# Patient Record
Sex: Male | Born: 1940 | Race: White | Hispanic: No | Marital: Married | Smoking: Former smoker
Health system: Southern US, Community
[De-identification: ages and names within clinical notes are randomized; demographics above are authoritative.]

## PROBLEM LIST (undated history)

## (undated) DIAGNOSIS — F419 Anxiety disorder, unspecified: Secondary | ICD-10-CM

## (undated) DIAGNOSIS — I10 Essential (primary) hypertension: Secondary | ICD-10-CM

## (undated) HISTORY — DX: Anxiety disorder, unspecified: F41.9

## (undated) HISTORY — PX: BACK SURGERY: SHX140

---

## 2019-06-26 ENCOUNTER — Encounter (HOSPITAL_COMMUNITY): Payer: Self-pay | Admitting: *Deleted

## 2019-06-26 ENCOUNTER — Other Ambulatory Visit: Payer: Self-pay

## 2019-06-26 ENCOUNTER — Emergency Department (HOSPITAL_COMMUNITY): Payer: Medicare Other

## 2019-06-26 ENCOUNTER — Emergency Department (HOSPITAL_COMMUNITY)
Admission: EM | Admit: 2019-06-26 | Discharge: 2019-06-27 | Disposition: A | Discharge status: Home / Self Care | Payer: Medicare Other | Attending: Emergency Medicine | Admitting: Emergency Medicine

## 2019-06-26 DIAGNOSIS — R1110 Vomiting, unspecified: Secondary | ICD-10-CM | POA: Insufficient documentation

## 2019-06-26 DIAGNOSIS — R509 Fever, unspecified: Secondary | ICD-10-CM | POA: Insufficient documentation

## 2019-06-26 DIAGNOSIS — Z9889 Other specified postprocedural states: Secondary | ICD-10-CM | POA: Insufficient documentation

## 2019-06-26 DIAGNOSIS — R0602 Shortness of breath: Secondary | ICD-10-CM | POA: Diagnosis not present

## 2019-06-26 DIAGNOSIS — Z5321 Procedure and treatment not carried out due to patient leaving prior to being seen by health care provider: Secondary | ICD-10-CM | POA: Insufficient documentation

## 2019-06-26 HISTORY — DX: Essential (primary) hypertension: I10

## 2019-06-26 LAB — URINALYSIS, ROUTINE W REFLEX MICROSCOPIC
Bilirubin Urine: NEGATIVE
Glucose, UA: NEGATIVE mg/dL
Ketones, ur: 20 mg/dL — AB
Leukocytes,Ua: NEGATIVE
Nitrite: NEGATIVE
Protein, ur: 30 mg/dL — AB
Specific Gravity, Urine: 1.025 (ref 1.005–1.030)
pH: 5 (ref 5.0–8.0)

## 2019-06-26 LAB — CBC WITH DIFFERENTIAL/PLATELET
Abs Immature Granulocytes: 0.06 10*3/uL (ref 0.00–0.07)
Basophils Absolute: 0 10*3/uL (ref 0.0–0.1)
Basophils Relative: 0 %
Eosinophils Absolute: 0 10*3/uL (ref 0.0–0.5)
Eosinophils Relative: 0 %
HCT: 34.2 % — ABNORMAL LOW (ref 39.0–52.0)
Hemoglobin: 11.2 g/dL — ABNORMAL LOW (ref 13.0–17.0)
Immature Granulocytes: 1 %
Lymphocytes Relative: 5 %
Lymphs Abs: 0.5 10*3/uL — ABNORMAL LOW (ref 0.7–4.0)
MCH: 30.2 pg (ref 26.0–34.0)
MCHC: 32.7 g/dL (ref 30.0–36.0)
MCV: 92.2 fL (ref 80.0–100.0)
Monocytes Absolute: 0.9 10*3/uL (ref 0.1–1.0)
Monocytes Relative: 9 %
Neutro Abs: 8.7 10*3/uL — ABNORMAL HIGH (ref 1.7–7.7)
Neutrophils Relative %: 85 %
Platelets: 193 10*3/uL (ref 150–400)
RBC: 3.71 MIL/uL — ABNORMAL LOW (ref 4.22–5.81)
RDW: 13.1 % (ref 11.5–15.5)
WBC: 10.2 10*3/uL (ref 4.0–10.5)
nRBC: 0 % (ref 0.0–0.2)

## 2019-06-26 LAB — COMPREHENSIVE METABOLIC PANEL
ALT: 19 U/L (ref 0–44)
AST: 17 U/L (ref 15–41)
Albumin: 3.6 g/dL (ref 3.5–5.0)
Alkaline Phosphatase: 49 U/L (ref 38–126)
Anion gap: 11 (ref 5–15)
BUN: 27 mg/dL — ABNORMAL HIGH (ref 8–23)
CO2: 23 mmol/L (ref 22–32)
Calcium: 9.7 mg/dL (ref 8.9–10.3)
Chloride: 100 mmol/L (ref 98–111)
Creatinine, Ser: 1.28 mg/dL — ABNORMAL HIGH (ref 0.61–1.24)
GFR calc Af Amer: >60 mL/min (ref >60)
GFR calc non Af Amer: 53 mL/min — ABNORMAL LOW (ref >60)
Glucose, Bld: 159 mg/dL — ABNORMAL HIGH (ref 70–99)
Potassium: 4.1 mmol/L (ref 3.5–5.1)
Sodium: 134 mmol/L — ABNORMAL LOW (ref 135–145)
Total Bilirubin: 1.6 mg/dL — ABNORMAL HIGH (ref 0.3–1.2)
Total Protein: 6.5 g/dL (ref 6.5–8.1)

## 2019-06-26 LAB — LACTIC ACID, PLASMA: Lactic Acid, Venous: 1.7 mmol/L (ref 0.5–1.9)

## 2019-06-26 IMAGING — CR DG CHEST 2V
2 series · 2 of 2 positions shown · non-contrast
Comparison: None.

CLINICAL DATA: Fever.

EXAM:
CHEST - 2 VIEW

[chest pa]
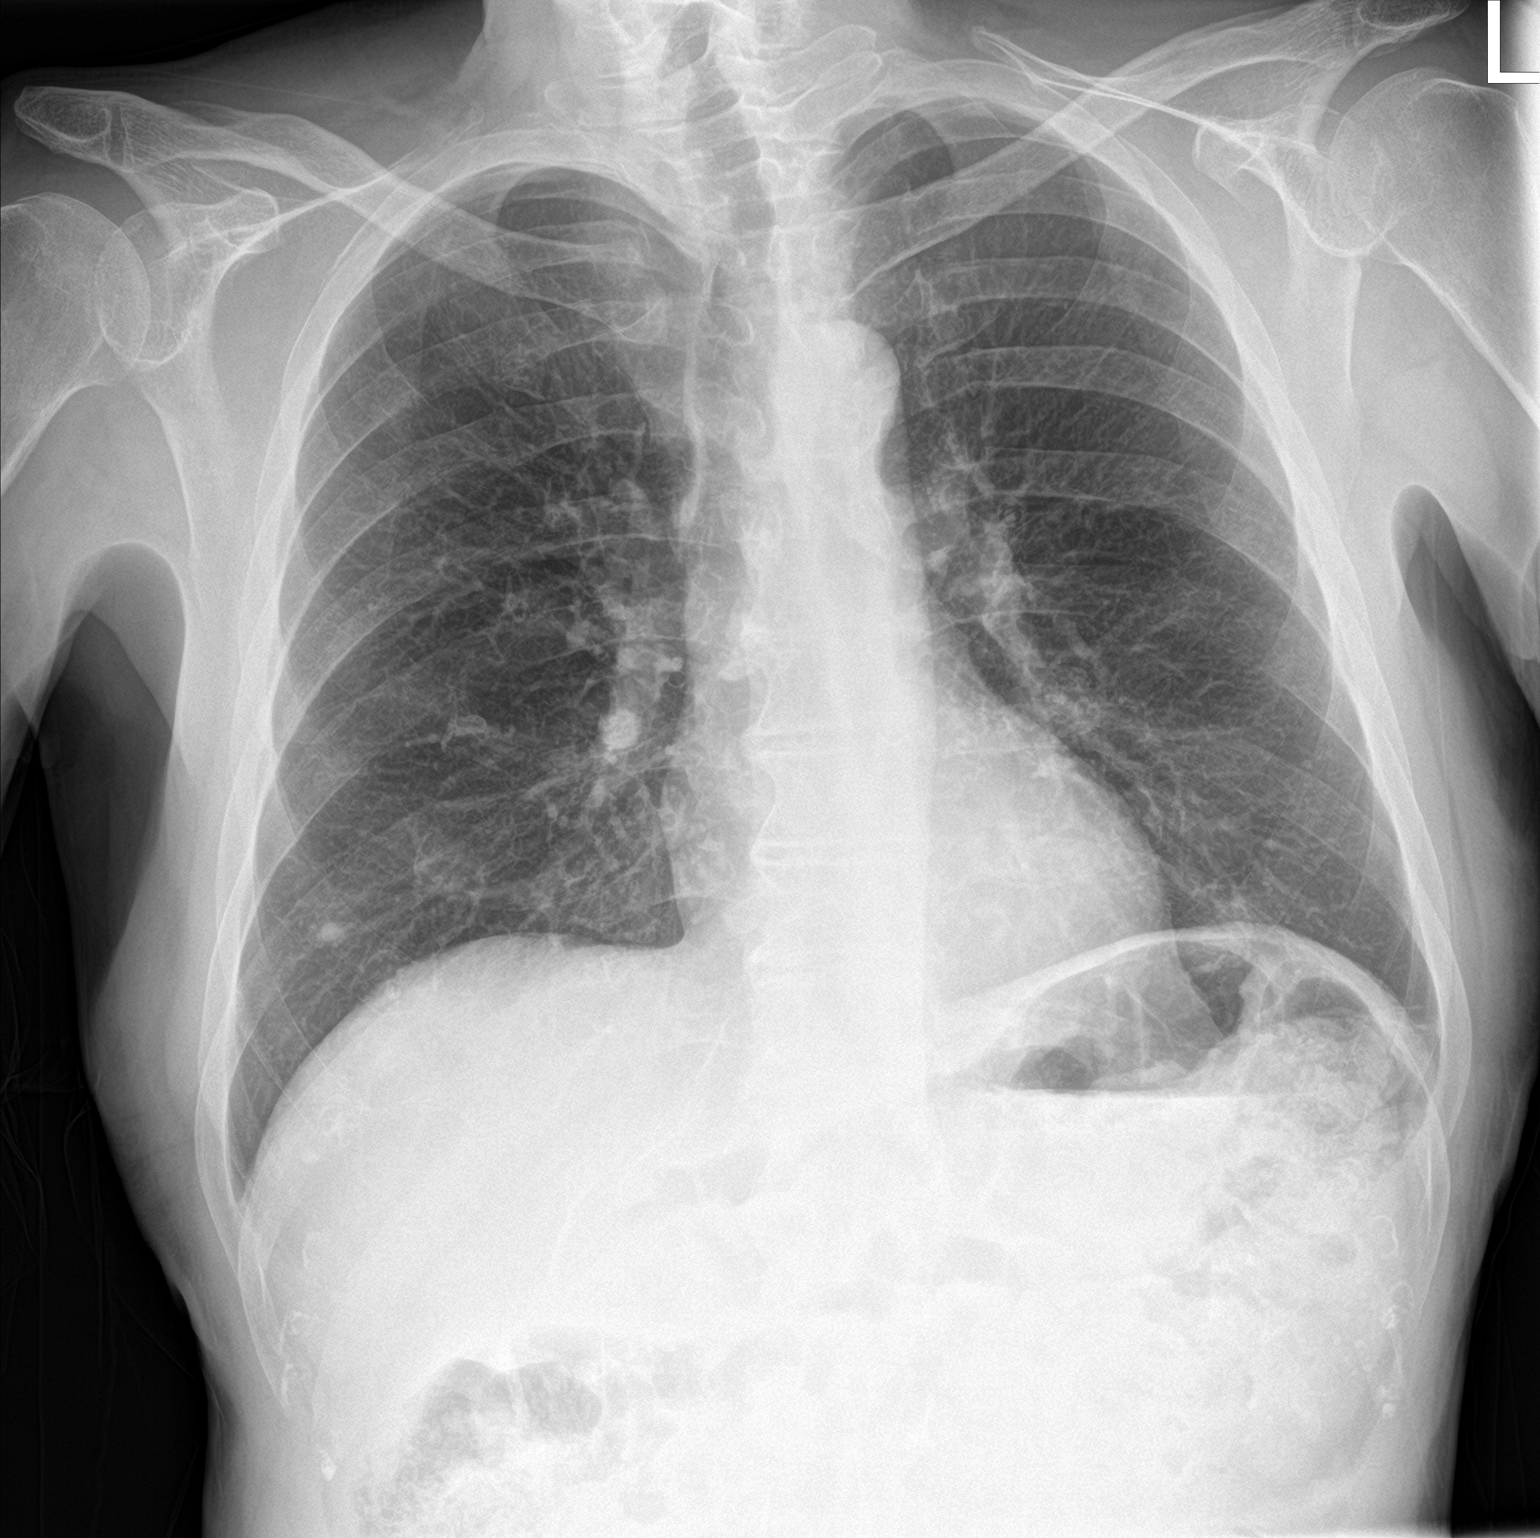

[chest lat]
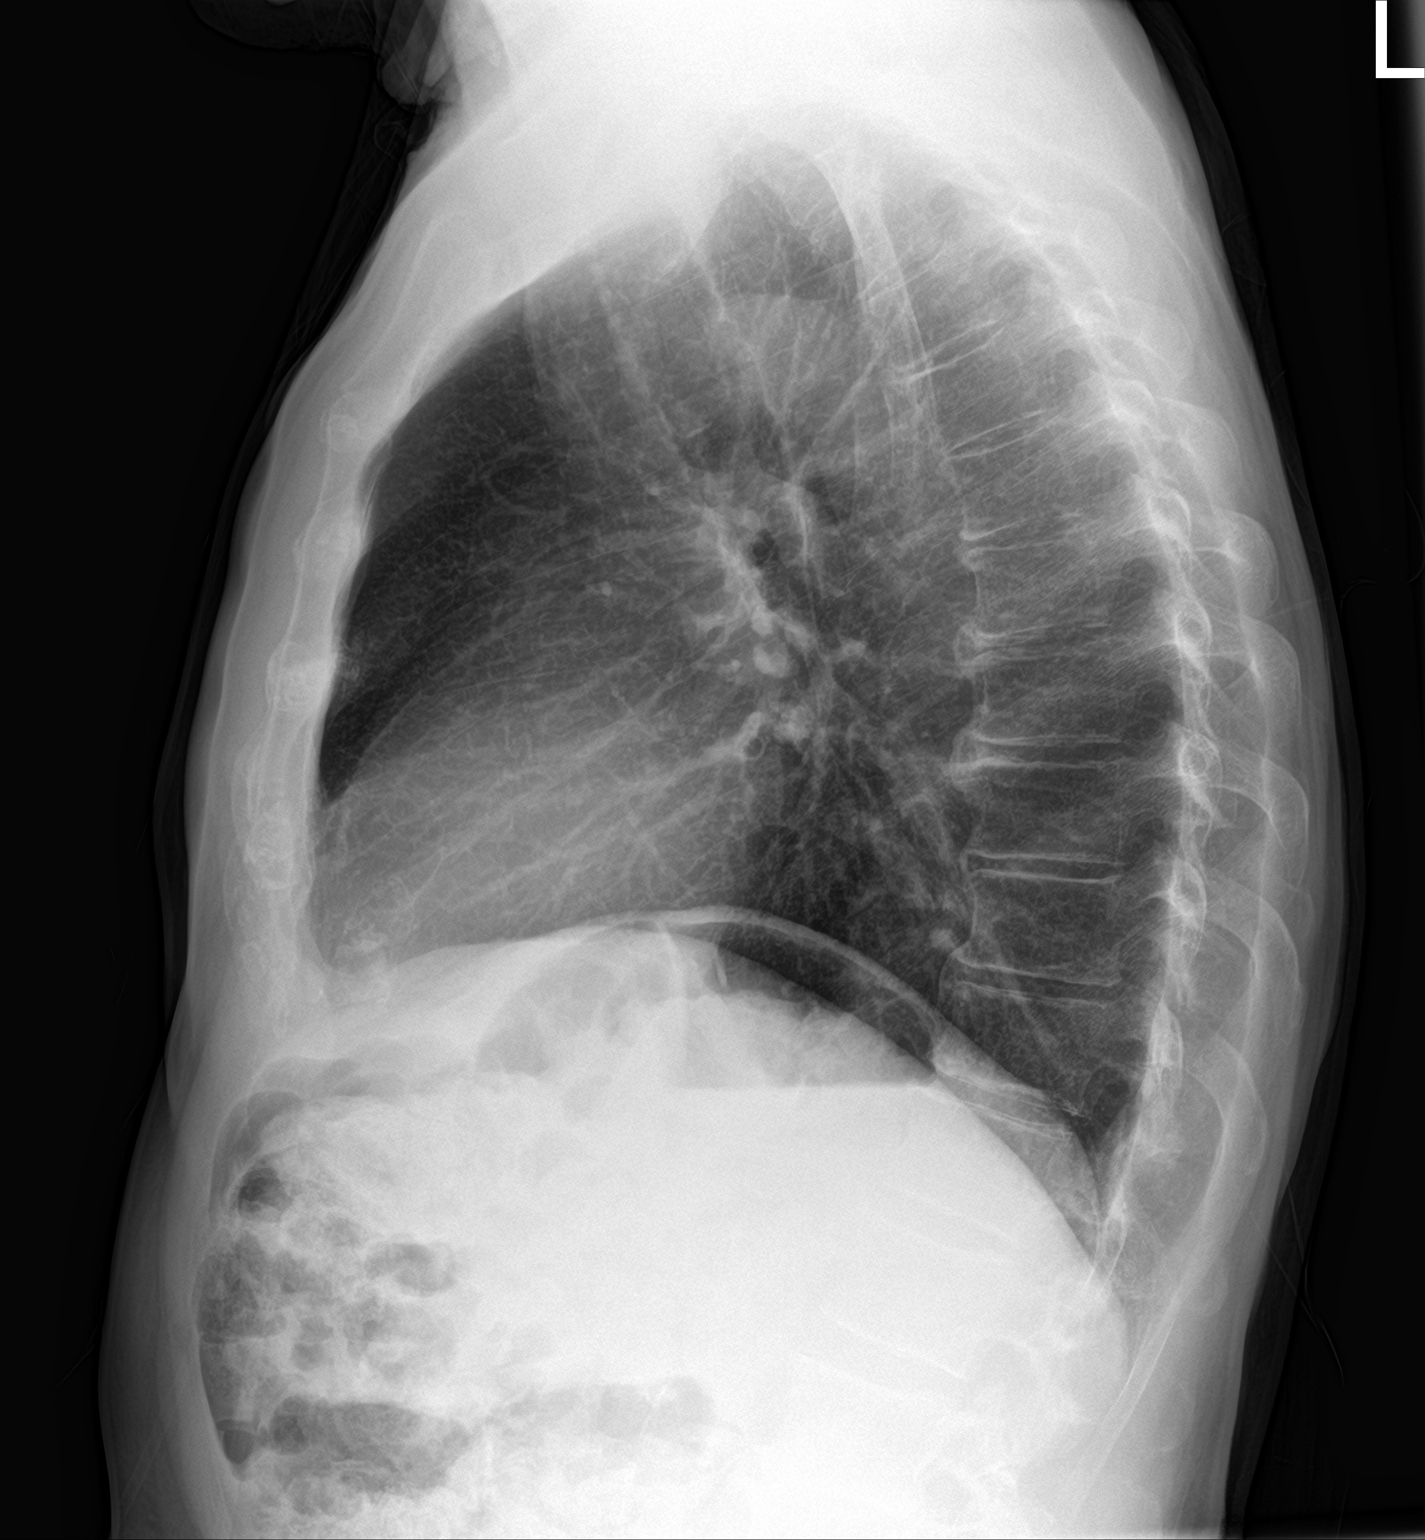

[2 of 2 positions shown; findings below may reference images not displayed]

FINDINGS: There is a rounded density at the right lung base overlying the
posterior ninth rib. There may be a calcified hilar lymph node on
the right. The heart size is normal. There is no pneumothorax. No
acute osseous abnormality. No significant pleural effusion. There
are atherosclerotic changes of the thoracic aorta.
IMPRESSION: 1. No acute cardiopulmonary process.
2. Probable granulomatous disease. However, in the absence of a
prior x-ray a follow-up two-view chest x-ray is recommended in 3
months to confirm stability of the dense pulmonary nodule at the
right lung base.

## 2019-06-26 MED ORDER — SODIUM CHLORIDE 0.9% FLUSH: 3.0000 mL | Freq: Once | INTRAVENOUS | Status: DC

## 2019-06-26 NOTE — ED Triage Notes (Signed)
Pt reports onset of vomiting today. Has also had a fever, shortness of breath, constipation. Denies cough. Pt reports having back surgery 1 week ago for herniated disk- area is red, draining serous drainage.

## 2019-06-26 NOTE — ED Notes (Signed)
Pt stated he is leaving AMA. He said he will go see a MD tomorrow. Pt advised to return if symptoms worsen.

## 2019-06-30 ENCOUNTER — Encounter (HOSPITAL_COMMUNITY): Payer: Self-pay | Admitting: Emergency Medicine

## 2019-06-30 ENCOUNTER — Emergency Department (HOSPITAL_COMMUNITY): Payer: Medicare Other

## 2019-06-30 ENCOUNTER — Inpatient Hospital Stay (HOSPITAL_COMMUNITY)
Admission: EM | Admit: 2019-06-30 | Discharge: 2019-07-10 | DRG: 856 | Disposition: A | Discharge status: Home Health | Payer: Medicare Other | Attending: Neurosurgery | Admitting: Neurosurgery

## 2019-06-30 DIAGNOSIS — T8149XA Infection following a procedure, other surgical site, initial encounter: Secondary | ICD-10-CM | POA: Diagnosis present

## 2019-06-30 DIAGNOSIS — B9561 Methicillin susceptible Staphylococcus aureus infection as the cause of diseases classified elsewhere: Secondary | ICD-10-CM | POA: Diagnosis present

## 2019-06-30 DIAGNOSIS — T8131XA Disruption of external operation (surgical) wound, not elsewhere classified, initial encounter: Secondary | ICD-10-CM | POA: Diagnosis present

## 2019-06-30 DIAGNOSIS — I429 Cardiomyopathy, unspecified: Secondary | ICD-10-CM | POA: Diagnosis present

## 2019-06-30 DIAGNOSIS — L89302 Pressure ulcer of unspecified buttock, stage 2: Secondary | ICD-10-CM | POA: Diagnosis not present

## 2019-06-30 DIAGNOSIS — I081 Rheumatic disorders of both mitral and tricuspid valves: Secondary | ICD-10-CM | POA: Diagnosis present

## 2019-06-30 DIAGNOSIS — F17290 Nicotine dependence, other tobacco product, uncomplicated: Secondary | ICD-10-CM | POA: Diagnosis present

## 2019-06-30 DIAGNOSIS — G061 Intraspinal abscess and granuloma: Secondary | ICD-10-CM | POA: Diagnosis present

## 2019-06-30 DIAGNOSIS — I5042 Chronic combined systolic (congestive) and diastolic (congestive) heart failure: Secondary | ICD-10-CM | POA: Diagnosis present

## 2019-06-30 DIAGNOSIS — E876 Hypokalemia: Secondary | ICD-10-CM | POA: Diagnosis not present

## 2019-06-30 DIAGNOSIS — T8143XA Infection following a procedure, organ and space surgical site, initial encounter: Secondary | ICD-10-CM | POA: Diagnosis present

## 2019-06-30 DIAGNOSIS — I444 Left anterior fascicular block: Secondary | ICD-10-CM | POA: Diagnosis present

## 2019-06-30 DIAGNOSIS — L0291 Cutaneous abscess, unspecified: Secondary | ICD-10-CM

## 2019-06-30 DIAGNOSIS — L0231 Cutaneous abscess of buttock: Secondary | ICD-10-CM | POA: Diagnosis not present

## 2019-06-30 DIAGNOSIS — G9341 Metabolic encephalopathy: Secondary | ICD-10-CM

## 2019-06-30 DIAGNOSIS — Y838 Other surgical procedures as the cause of abnormal reaction of the patient, or of later complication, without mention of misadventure at the time of the procedure: Secondary | ICD-10-CM | POA: Diagnosis present

## 2019-06-30 DIAGNOSIS — N179 Acute kidney failure, unspecified: Secondary | ICD-10-CM

## 2019-06-30 DIAGNOSIS — Z79899 Other long term (current) drug therapy: Secondary | ICD-10-CM | POA: Diagnosis not present

## 2019-06-30 DIAGNOSIS — L89152 Pressure ulcer of sacral region, stage 2: Secondary | ICD-10-CM | POA: Diagnosis present

## 2019-06-30 DIAGNOSIS — G062 Extradural and subdural abscess, unspecified: Secondary | ICD-10-CM

## 2019-06-30 DIAGNOSIS — F05 Delirium due to known physiological condition: Secondary | ICD-10-CM | POA: Diagnosis present

## 2019-06-30 DIAGNOSIS — M4646 Discitis, unspecified, lumbar region: Secondary | ICD-10-CM | POA: Diagnosis present

## 2019-06-30 DIAGNOSIS — I4892 Unspecified atrial flutter: Secondary | ICD-10-CM

## 2019-06-30 DIAGNOSIS — R6510 Systemic inflammatory response syndrome (SIRS) of non-infectious origin without acute organ dysfunction: Secondary | ICD-10-CM | POA: Diagnosis present

## 2019-06-30 DIAGNOSIS — I959 Hypotension, unspecified: Secondary | ICD-10-CM | POA: Diagnosis present

## 2019-06-30 DIAGNOSIS — Z95828 Presence of other vascular implants and grafts: Secondary | ICD-10-CM

## 2019-06-30 DIAGNOSIS — I4891 Unspecified atrial fibrillation: Secondary | ICD-10-CM | POA: Diagnosis not present

## 2019-06-30 DIAGNOSIS — R410 Disorientation, unspecified: Secondary | ICD-10-CM | POA: Diagnosis not present

## 2019-06-30 DIAGNOSIS — I5041 Acute combined systolic (congestive) and diastolic (congestive) heart failure: Secondary | ICD-10-CM | POA: Diagnosis not present

## 2019-06-30 DIAGNOSIS — R600 Localized edema: Secondary | ICD-10-CM | POA: Diagnosis not present

## 2019-06-30 DIAGNOSIS — I130 Hypertensive heart and chronic kidney disease with heart failure and stage 1 through stage 4 chronic kidney disease, or unspecified chronic kidney disease: Secondary | ICD-10-CM | POA: Diagnosis present

## 2019-06-30 DIAGNOSIS — F329 Major depressive disorder, single episode, unspecified: Secondary | ICD-10-CM | POA: Diagnosis present

## 2019-06-30 DIAGNOSIS — E871 Hypo-osmolality and hyponatremia: Secondary | ICD-10-CM | POA: Diagnosis present

## 2019-06-30 DIAGNOSIS — Z20822 Contact with and (suspected) exposure to covid-19: Secondary | ICD-10-CM | POA: Diagnosis present

## 2019-06-30 DIAGNOSIS — R7881 Bacteremia: Secondary | ICD-10-CM | POA: Diagnosis not present

## 2019-06-30 DIAGNOSIS — R471 Dysarthria and anarthria: Secondary | ICD-10-CM | POA: Diagnosis present

## 2019-06-30 DIAGNOSIS — D638 Anemia in other chronic diseases classified elsewhere: Secondary | ICD-10-CM | POA: Diagnosis present

## 2019-06-30 DIAGNOSIS — E860 Dehydration: Secondary | ICD-10-CM | POA: Diagnosis present

## 2019-06-30 DIAGNOSIS — L8990 Pressure ulcer of unspecified site, unspecified stage: Secondary | ICD-10-CM | POA: Insufficient documentation

## 2019-06-30 DIAGNOSIS — I313 Pericardial effusion (noninflammatory): Secondary | ICD-10-CM | POA: Diagnosis present

## 2019-06-30 DIAGNOSIS — N183 Chronic kidney disease, stage 3 unspecified: Secondary | ICD-10-CM | POA: Diagnosis present

## 2019-06-30 DIAGNOSIS — I340 Nonrheumatic mitral (valve) insufficiency: Secondary | ICD-10-CM | POA: Diagnosis not present

## 2019-06-30 DIAGNOSIS — Z825 Family history of asthma and other chronic lower respiratory diseases: Secondary | ICD-10-CM

## 2019-06-30 DIAGNOSIS — D649 Anemia, unspecified: Secondary | ICD-10-CM | POA: Diagnosis not present

## 2019-06-30 LAB — COMPREHENSIVE METABOLIC PANEL
ALT: 23 U/L (ref 0–44)
AST: 29 U/L (ref 15–41)
Albumin: 2.3 g/dL — ABNORMAL LOW (ref 3.5–5.0)
Alkaline Phosphatase: 76 U/L (ref 38–126)
Anion gap: 12 (ref 5–15)
BUN: 41 mg/dL — ABNORMAL HIGH (ref 8–23)
CO2: 22 mmol/L (ref 22–32)
Calcium: 8.8 mg/dL — ABNORMAL LOW (ref 8.9–10.3)
Chloride: 93 mmol/L — ABNORMAL LOW (ref 98–111)
Creatinine, Ser: 1.44 mg/dL — ABNORMAL HIGH (ref 0.61–1.24)
GFR calc Af Amer: 54 mL/min — ABNORMAL LOW (ref >60)
GFR calc non Af Amer: 46 mL/min — ABNORMAL LOW (ref >60)
Glucose, Bld: 153 mg/dL — ABNORMAL HIGH (ref 70–99)
Potassium: 4.6 mmol/L (ref 3.5–5.1)
Sodium: 127 mmol/L — ABNORMAL LOW (ref 135–145)
Total Bilirubin: 1.1 mg/dL (ref 0.3–1.2)
Total Protein: 5.3 g/dL — ABNORMAL LOW (ref 6.5–8.1)

## 2019-06-30 LAB — CBC WITH DIFFERENTIAL/PLATELET
Abs Immature Granulocytes: 0.07 10*3/uL (ref 0.00–0.07)
Basophils Absolute: 0.1 10*3/uL (ref 0.0–0.1)
Basophils Relative: 1 %
Eosinophils Absolute: 0 10*3/uL (ref 0.0–0.5)
Eosinophils Relative: 0 %
HCT: 30.4 % — ABNORMAL LOW (ref 39.0–52.0)
Hemoglobin: 10.1 g/dL — ABNORMAL LOW (ref 13.0–17.0)
Immature Granulocytes: 1 %
Lymphocytes Relative: 2 %
Lymphs Abs: 0.2 10*3/uL — ABNORMAL LOW (ref 0.7–4.0)
MCH: 30 pg (ref 26.0–34.0)
MCHC: 33.2 g/dL (ref 30.0–36.0)
MCV: 90.2 fL (ref 80.0–100.0)
Monocytes Absolute: 0.6 10*3/uL (ref 0.1–1.0)
Monocytes Relative: 8 %
Neutro Abs: 6.5 10*3/uL (ref 1.7–7.7)
Neutrophils Relative %: 88 %
Platelets: 166 10*3/uL (ref 150–400)
RBC: 3.37 MIL/uL — ABNORMAL LOW (ref 4.22–5.81)
RDW: 13.5 % (ref 11.5–15.5)
WBC: 7.4 10*3/uL (ref 4.0–10.5)
nRBC: 0 % (ref 0.0–0.2)

## 2019-06-30 LAB — URINALYSIS, ROUTINE W REFLEX MICROSCOPIC
Bilirubin Urine: NEGATIVE
Glucose, UA: NEGATIVE mg/dL
Ketones, ur: NEGATIVE mg/dL
Leukocytes,Ua: NEGATIVE
Nitrite: NEGATIVE
Protein, ur: 100 mg/dL — AB
Specific Gravity, Urine: 1.018 (ref 1.005–1.030)
pH: 5 (ref 5.0–8.0)

## 2019-06-30 LAB — LACTIC ACID, PLASMA: Lactic Acid, Venous: 1.7 mmol/L (ref 0.5–1.9)

## 2019-06-30 LAB — SARS CORONAVIRUS 2 BY RT PCR (HOSPITAL ORDER, PERFORMED IN ~~LOC~~ HOSPITAL LAB): SARS Coronavirus 2: NEGATIVE

## 2019-06-30 LAB — APTT: aPTT: 27 seconds (ref 24–36)

## 2019-06-30 LAB — PROTIME-INR
INR: 1.1 (ref 0.8–1.2)
Prothrombin Time: 13.3 seconds (ref 11.4–15.2)

## 2019-06-30 LAB — C-REACTIVE PROTEIN: CRP: 27.2 mg/dL — ABNORMAL HIGH (ref <1.0)

## 2019-06-30 IMAGING — DX DG CHEST 1V PORT
1 series · 1 of 1 positions shown · non-contrast
Comparison: [DATE]

CLINICAL DATA: Fever, altered mental status

EXAM:
PORTABLE CHEST 1 VIEW

[chest ap]
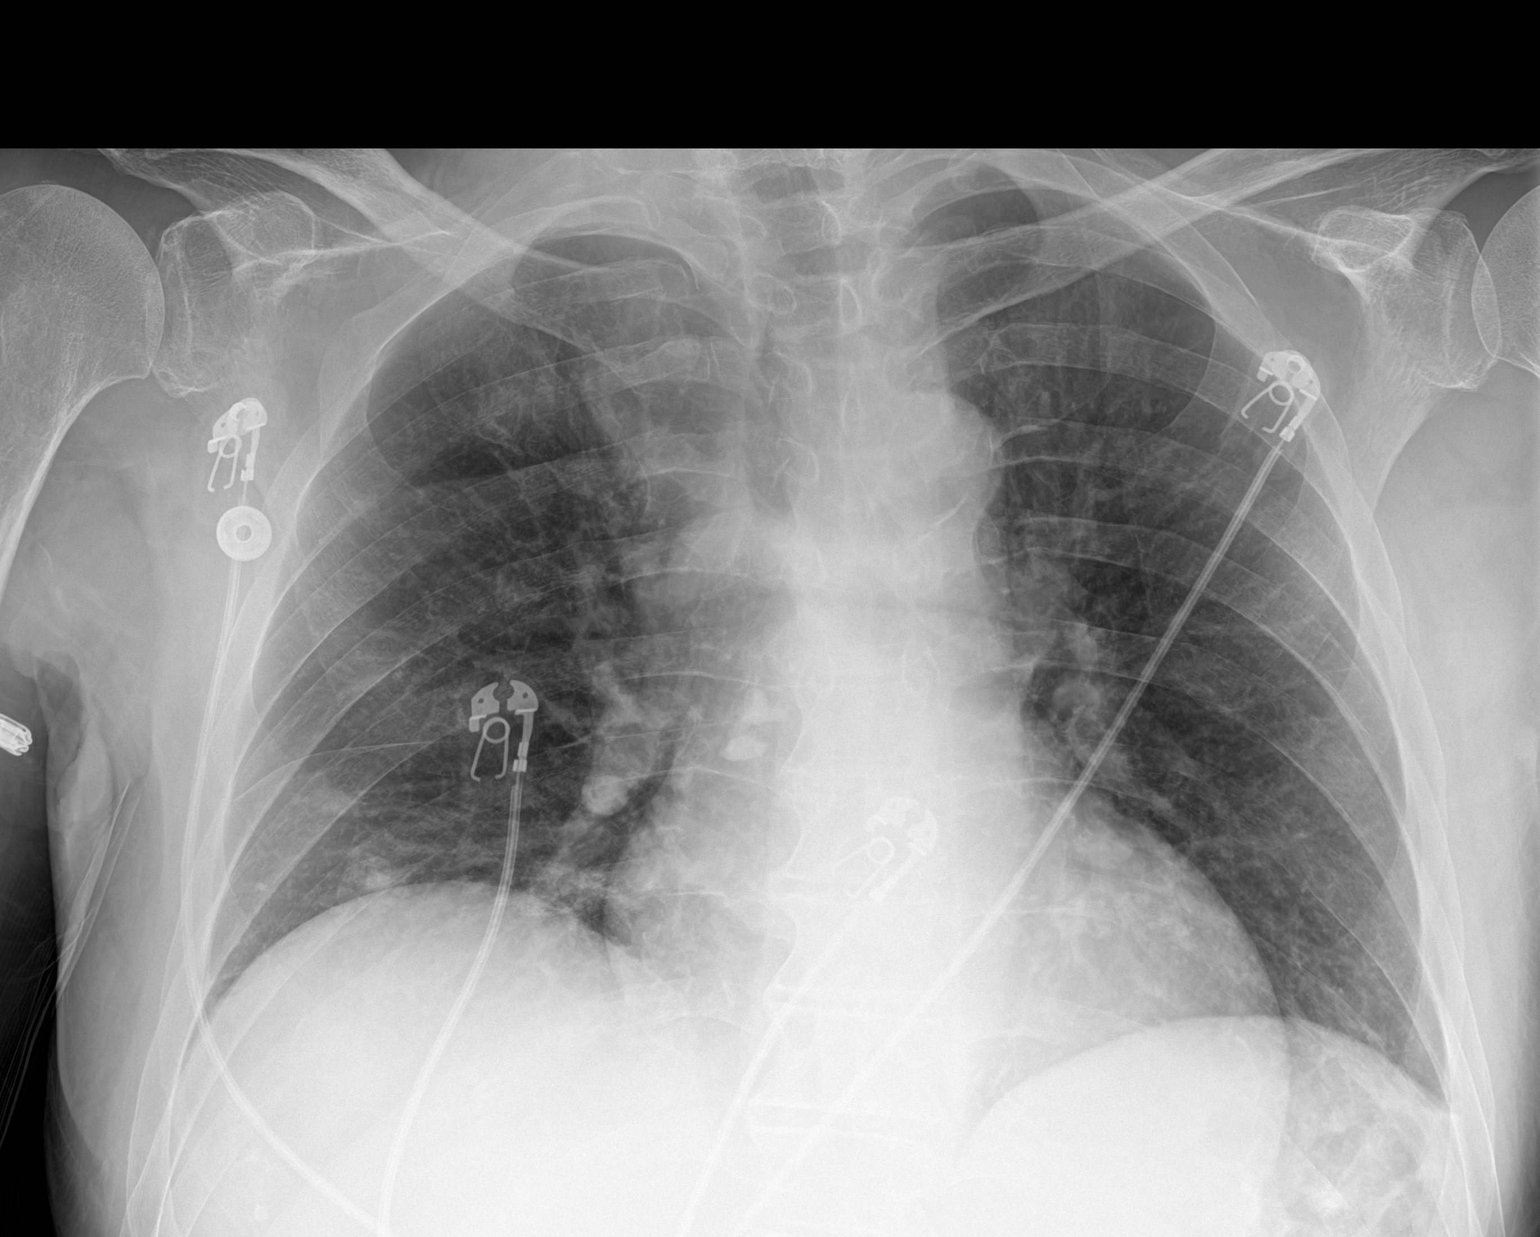

[1 of 1 positions shown; findings below may reference images not displayed]

FINDINGS: Low lung volumes. Small rounded density is again identified at the
right base. There is minimal patchy density at the right lung base.
Stable cardiomediastinal contours no pleural effusion or
pneumothorax.
IMPRESSION: Minimal patchy atelectasis/consolidation at the base. Small rounded
density again identified also at the right base and may reflect a
calcified granuloma

## 2019-06-30 MED ORDER — SODIUM CHLORIDE 0.9% FLUSH
3.0000 mL | Freq: Two times a day (BID) | INTRAVENOUS | Status: DC
  Administered 2019-07-01 – 2019-07-09 (×13): 3 mL via INTRAVENOUS

## 2019-06-30 MED ORDER — SODIUM CHLORIDE 0.9 % IV SOLN
2.0000 g | Freq: Two times a day (BID) | INTRAVENOUS | Status: DC
  Administered 2019-06-30: 2 g via INTRAVENOUS
  Filled 2019-06-30 (×3): qty 2

## 2019-06-30 MED ORDER — ONDANSETRON HCL 4 MG PO TABS: 4.0000 mg | ORAL_TABLET | Freq: Four times a day (QID) | ORAL | Status: DC | PRN

## 2019-06-30 MED ORDER — ONDANSETRON HCL 4 MG/2ML IJ SOLN
4.0000 mg | Freq: Four times a day (QID) | INTRAMUSCULAR | Status: DC | PRN
  Administered 2019-06-30: 4 mg via INTRAVENOUS
  Filled 2019-06-30: qty 2

## 2019-06-30 MED ORDER — PAROXETINE HCL 30 MG PO TABS
30.0000 mg | ORAL_TABLET | Freq: Every evening | ORAL | Status: DC
  Administered 2019-07-01 – 2019-07-09 (×9): 30 mg via ORAL
  Filled 2019-06-30 (×11): qty 1

## 2019-06-30 MED ORDER — OXYCODONE HCL 5 MG PO TABS
5.0000 mg | ORAL_TABLET | ORAL | Status: DC | PRN
  Administered 2019-06-30 – 2019-07-02 (×6): 5 mg via ORAL
  Filled 2019-06-30 (×6): qty 1

## 2019-06-30 MED ORDER — ACETAMINOPHEN 650 MG RE SUPP: 650.0000 mg | Freq: Four times a day (QID) | RECTAL | Status: DC | PRN

## 2019-06-30 MED ORDER — ACETAMINOPHEN 500 MG PO TABS
1000.0000 mg | ORAL_TABLET | Freq: Once | ORAL | Status: AC
  Administered 2019-06-30: 1000 mg via ORAL
  Filled 2019-06-30: qty 2

## 2019-06-30 MED ORDER — SODIUM CHLORIDE 0.9 % IV SOLN
250.0000 mL | INTRAVENOUS | Status: DC | PRN
  Administered 2019-07-03: 250 mL via INTRAVENOUS

## 2019-06-30 MED ORDER — SODIUM CHLORIDE 0.9 % IV SOLN: INTRAVENOUS | Status: DC

## 2019-06-30 MED ORDER — SODIUM CHLORIDE 0.9% FLUSH: 3.0000 mL | INTRAVENOUS | Status: DC | PRN

## 2019-06-30 MED ORDER — ACETAMINOPHEN 325 MG PO TABS
650.0000 mg | ORAL_TABLET | Freq: Four times a day (QID) | ORAL | Status: DC | PRN
  Administered 2019-06-30 – 2019-07-10 (×8): 650 mg via ORAL
  Filled 2019-06-30 (×9): qty 2

## 2019-06-30 MED ORDER — DOCUSATE SODIUM 100 MG PO CAPS
100.0000 mg | ORAL_CAPSULE | Freq: Two times a day (BID) | ORAL | Status: DC
  Administered 2019-06-30 – 2019-07-10 (×16): 100 mg via ORAL
  Filled 2019-06-30 (×16): qty 1

## 2019-06-30 MED ORDER — HYDROMORPHONE HCL 1 MG/ML IJ SOLN
0.5000 mg | INTRAMUSCULAR | Status: DC | PRN
  Administered 2019-06-30: 0.5 mg via INTRAVENOUS
  Filled 2019-06-30 (×2): qty 1

## 2019-06-30 MED ORDER — ZOLPIDEM TARTRATE 5 MG PO TABS: 5.0000 mg | ORAL_TABLET | Freq: Every evening | ORAL | Status: DC | PRN

## 2019-06-30 MED ORDER — BISACODYL 10 MG RE SUPP: 10.0000 mg | Freq: Every day | RECTAL | Status: DC | PRN

## 2019-06-30 MED ORDER — LACTATED RINGERS IV BOLUS
1000.0000 mL | Freq: Once | INTRAVENOUS | Status: AC
  Administered 2019-06-30: 1000 mL via INTRAVENOUS

## 2019-06-30 MED ORDER — POLYETHYLENE GLYCOL 3350 17 G PO PACK
17.0000 g | PACK | Freq: Every day | ORAL | Status: DC | PRN
  Filled 2019-06-30: qty 1

## 2019-06-30 MED ORDER — VANCOMYCIN HCL 1250 MG/250ML IV SOLN
1250.0000 mg | INTRAVENOUS | Status: DC
  Administered 2019-06-30: 1250 mg via INTRAVENOUS
  Filled 2019-06-30 (×2): qty 250

## 2019-06-30 MED ORDER — LISINOPRIL 10 MG PO TABS: 10.0000 mg | ORAL_TABLET | Freq: Every evening | ORAL | Status: DC

## 2019-06-30 MED ORDER — ALPRAZOLAM 0.5 MG PO TABS
1.0000 mg | ORAL_TABLET | Freq: Every day | ORAL | Status: DC
  Administered 2019-06-30 – 2019-07-02 (×3): 1 mg via ORAL
  Filled 2019-06-30 (×3): qty 2

## 2019-06-30 MED ORDER — LACTATED RINGERS IV BOLUS (SEPSIS)
1000.0000 mL | Freq: Once | INTRAVENOUS | Status: AC
  Administered 2019-06-30: 1000 mL via INTRAVENOUS

## 2019-06-30 NOTE — Consult Note (Signed)
[facility] Medical Consultation  [facility] OJJ:[id number] DOB: 05/26/1940 DOA: 06/30/2019 PCP: Patient, No Pcp Per   Requesting physician: Dr. Kathyrn Sheriff  Date of consultation: 06/30/2019 Reason for consultation: Assistance with medical management  Impression/Recommendations   1. Postoperative wound infection: Patient status post left L4-L5 microdissection on 5/11 by Dr. Kathyrn Sheriff.  Wound cultures ordered.  Placed on empiric antibiotics of cefepime and vancomycin.  Plan appears to be to take patient to the operating room for washout of the wound in a.m. -Per neurosurgery  2. Metabolic encephalopathy: Suspect secondary to above. -Continue to monitor  3. Transient hypotension: Acute. Blood pressures noted initially as low as 96/49, but were responsive to IV fluids. -Continue IV fluids normal saline at 100 mL/h -Hold home blood pressure medications lisinopril due to AKI and hypotension  4. Hyponatremia: Acute.  Given patient history of decreased p.o. and presenting hypotension.  Intake suspect hypovolemic hyponatremia.  Other possible causes include medication as patient is on Paxil, but review of records on care everywhere notes sodium levels previously within normal limits. -Goal sodium correction less than 10 mmol /24 hr -Continue IV fluids and adjust fluids as needed  5. Acute kidney injury: On admission creatinine elevated up to 1.44 with BUN 41.  Urinalysis did not show any significant signs of infection.  Elevated BUN to creatinine ratio suggest prerenal cause of symptoms.  Patient baseline creatinine previously noted to be around 1.08 per records on Care Everywhere. -Continue IV fluids -Continue to monitor kidney function daily  -Would hold nephrotoxic agents such as lisinopril  6. Depression: Home medications include Paxil 30 mg daily and Xanax 1 mg nightly. -Continue current medication regimen  I will followup again tomorrow. Please contact me if I can be of  assistance in the meanwhile. Thank you for this consultation.  Chief Complaint: Fever, confusion, and back pain  HPI:  Neil Cordova is a 79 y.o. male with medical history significant of hypertension, depression, and left L4-5 microdissection by Dr. Kathyrn Sheriff on 5/11 presents with complaints of fever and confusion.  History is obtained from the patient's wife.  He had initially been doing very well following his procedure, but started to have severe back pain near the surgery site approximately 6 days ago.  Noted to have fevers elevated up to 103.6 F at home.  Wife notes associated symptoms of patient being confused, purulent drainage from the surgical site, decreased appetite, and at least one episode of vomiting when symptoms first started.  It appears that was seen in the emergency department 4 days ago for symptoms, but ultimately left against medical advise.  He represented to the hospital today as symptoms were not improving.  Denied having any issues with urinating or focal weakness.  On admission patient was seen to be febrile up to 102.8 F with blood pressures as low as 96/49.  Labs significant for hemoglobin 10.1, sodium 127, BUN 41, creatinine 1.44, and CRP 27.2.  Urinalysis was negative for any signs of infection.  Patient had been given 2 L of IV fluids.    Review of Systems  Unable to perform ROS: Mental status change  Musculoskeletal: Positive for back pain.    Past Medical History:  Diagnosis Date  . Hypertension    Past Surgical History:  Procedure Laterality Date  . BACK SURGERY     Social History:  has no history on file for tobacco, alcohol, and drug.  No Known Allergies No family history on file.  Prior to Admission medications  Not on File   Physical Exam:  Constitutional: Elderly male who is alert, but appears acutely ill Vitals:   06/30/19 1200 06/30/19 1215 06/30/19 1230 06/30/19 1245  BP: (!) 104/49 (!) 100/46 (!) 102/47 (!) 96/49  Pulse: 89 88 86 100   Resp: (!) 23 (!) 25 (!) 26 (!) 25  Temp:    (!) 100.5 F (38.1 C)  TempSrc:    Oral  SpO2: 96% 96% 97% 97%  Height:       Eyes: PERRL, lids and conjunctivae normal ENMT: Mucous membranes are dry. Posterior pharynx clear of any exudate or lesions.  Neck: normal, supple, no masses, no thyromegaly Respiratory: Mildly tachypneic with shallow inspiratory and expiratory effort.  Patient O2 saturation currently maintained on room air. Cardiovascular: Regular rate and rhythm, no murmurs / rubs / gallops. No extremity edema. 2+ pedal pulses. No carotid bruits.  Abdomen: no tenderness, no masses palpated. No hepatosplenomegaly. Bowel sounds positive.  Musculoskeletal: no clubbing / cyanosis.   Skin: Postsurgical wound dehiscence noted at the lumbar spine with purulent drainage appreciated on bedding. Neurologic: CN 2-12 grossly intact. Sensation intact, DTR normal. Strength 5/5 in all 4.  Psychiatric: Normal judgment and insight. Alert, but confused normal mood.   Labs on Admission:  Basic Metabolic Panel: Recent Labs  Lab 06/26/19 2020 06/30/19 1055  NA 134* 127*  K 4.1 4.6  CL 100 93*  CO2 23 22  GLUCOSE 159* 153*  BUN 27* 41*  CREATININE 1.28* 1.44*  CALCIUM 9.7 8.8*   Liver Function Tests: Recent Labs  Lab 06/26/19 2020 06/30/19 1055  AST 17 29  ALT 19 23  ALKPHOS 49 76  BILITOT 1.6* 1.1  PROT 6.5 5.3*  ALBUMIN 3.6 2.3*   No results for input(s): LIPASE, AMYLASE in the last 168 hours. No results for input(s): AMMONIA in the last 168 hours. CBC: Recent Labs  Lab 06/26/19 2020 06/30/19 1055  WBC 10.2 7.4  NEUTROABS 8.7* 6.5  HGB 11.2* 10.1*  HCT 34.2* 30.4*  MCV 92.2 90.2  PLT 193 166   Cardiac Enzymes: No results for input(s): CKTOTAL, CKMB, CKMBINDEX, TROPONINI in the last 168 hours. BNP: Invalid input(s): POCBNP CBG: No results for input(s): GLUCAP in the last 168 hours.  Radiological Exams on Admission: DG Chest Port 1 View  Result Date:  06/30/2019 CLINICAL DATA:  Fever, altered mental status EXAM: PORTABLE CHEST 1 VIEW COMPARISON:  06/26/2018 FINDINGS: Low lung volumes. Small rounded density is again identified at the right base. There is minimal patchy density at the right lung base. Stable cardiomediastinal contours no pleural effusion or pneumothorax. IMPRESSION: Minimal patchy atelectasis/consolidation at the base. Small rounded density again identified also at the right base and may reflect a calcified granuloma Electronically Signed   By: Guadlupe Spanish M.D.   On: 06/30/2019 11:11    EKG: Independently reviewed.  Sinus rhythm at 88 bpm  Time spent: >45 minutes  Tanner Vigna A Katrinka Blazing [facility] Pager [phone]  If 7PM-7AM, please contact night-coverage [email or web address] Password [email or web address] 06/30/2019, 1:50 PM

## 2019-06-30 NOTE — ED Provider Notes (Signed)
[facility] Provider Note   CSN: [id number] Arrival date & time: 06/30/19  1023     History Chief Complaint  Patient presents with  . Fever  . Altered Mental Status    Neil Cordova is a 79 y.o. male.  79 yo M with a chief complaints of low back pain and fever.  Patient had a microdiscectomy performed on May 10 by Dr. Kathyrn Sheriff.  This was about 2 weeks ago.  He was reportedly fine had had significant improvements of his back pain and then about 6 days ago started having a fever.  He had one episode of vomiting.  Worsening back pain.  Diffusely weak and starting to have some confusion.  This morning was too confused and too weak to get out of bed.  They had been in contact with the neurosurgical office and had been sending them pictures of the wound.  They deny cough or congestion denies abdominal pain denied urinary symptoms.  The history is provided by the patient and the spouse.  Fever Associated symptoms: nausea and vomiting   Associated symptoms: no chest pain, no chills, no confusion, no congestion, no cough, no diarrhea, no headaches, no myalgias and no rash   Altered Mental Status Presenting symptoms: no confusion   Associated symptoms: fever, nausea and vomiting   Associated symptoms: no abdominal pain, no headaches, no palpitations and no rash   Illness Severity:  Moderate Onset quality:  Gradual Duration:  1 week Timing:  Constant Progression:  Worsening Chronicity:  New Associated symptoms: fever, nausea and vomiting   Associated symptoms: no abdominal pain, no chest pain, no congestion, no cough, no diarrhea, no headaches, no myalgias, no rash and no shortness of breath        Past Medical History:  Diagnosis Date  . Hypertension     There are no problems to display for this patient.   Past Surgical History:  Procedure Laterality Date  . BACK SURGERY         No family history on file.  Social History    Tobacco Use  . Smoking status: Not on file  Substance Use Topics  . Alcohol use: Not on file  . Drug use: Not on file    Home Medications Prior to Admission medications   Not on File    Allergies    Patient has no allergy information on record.  Review of Systems   Review of Systems  Constitutional: Positive for fever. Negative for chills.  HENT: Negative for congestion and facial swelling.   Eyes: Negative for discharge and visual disturbance.  Respiratory: Negative for cough and shortness of breath.   Cardiovascular: Negative for chest pain and palpitations.  Gastrointestinal: Positive for nausea and vomiting. Negative for abdominal pain and diarrhea.  Musculoskeletal: Negative for arthralgias and myalgias.  Skin: Negative for color change and rash.  Neurological: Negative for tremors, syncope and headaches.  Psychiatric/Behavioral: Negative for confusion and dysphoric mood.    Physical Exam Updated Vital Signs BP (!) 96/49   Pulse 100   Temp (!) 100.5 F (38.1 C) (Oral)   Resp (!) 25   Ht 5\' 10"  (1.778 m)   SpO2 97%   Physical Exam Vitals and nursing note reviewed.  Constitutional:      Appearance: He is well-developed.     Comments: Sleepy   HENT:     Head: Normocephalic and atraumatic.  Eyes:     Pupils: Pupils are equal, round, and reactive  to light.  Neck:     Vascular: No JVD.  Cardiovascular:     Rate and Rhythm: Normal rate and regular rhythm.     Heart sounds: No murmur. No friction rub. No gallop.   Pulmonary:     Effort: No respiratory distress.     Breath sounds: No wheezing.  Abdominal:     General: There is no distension.     Tenderness: There is no guarding or rebound.  Musculoskeletal:        General: Normal range of motion.     Cervical back: Normal range of motion and neck supple.     Comments: Incision site to the L-spine region with some mild erythema has purulent drainage upon suppression.  He also has a stage II sacral ulcer  that is dime sized.  Skin:    Coloration: Skin is not pale.     Findings: No rash.  Neurological:     Mental Status: He is alert.     ED Results / Procedures / Treatments   Labs (all labs ordered are listed, but only abnormal results are displayed) Labs Reviewed  COMPREHENSIVE METABOLIC PANEL - Abnormal; Notable for the following components:      Result Value   Sodium 127 (*)    Chloride 93 (*)    Glucose, Bld 153 (*)    BUN 41 (*)    Creatinine, Ser 1.44 (*)    Calcium 8.8 (*)    Total Protein 5.3 (*)    Albumin 2.3 (*)    GFR calc non Af Amer 46 (*)    GFR calc Af Amer 54 (*)    All other components within normal limits  CBC WITH DIFFERENTIAL/PLATELET - Abnormal; Notable for the following components:   RBC 3.37 (*)    Hemoglobin 10.1 (*)    HCT 30.4 (*)    Lymphs Abs 0.2 (*)    All other components within normal limits  URINALYSIS, ROUTINE W REFLEX MICROSCOPIC - Abnormal; Notable for the following components:   APPearance HAZY (*)    Hgb urine dipstick LARGE (*)    Protein, ur 100 (*)    Bacteria, UA FEW (*)    All other components within normal limits  C-REACTIVE PROTEIN - Abnormal; Notable for the following components:   CRP 27.2 (*)    All other components within normal limits  CULTURE, BLOOD (ROUTINE X 2)  CULTURE, BLOOD (ROUTINE X 2)  URINE CULTURE  SARS CORONAVIRUS 2 BY RT PCR (HOSPITAL ORDER, PERFORMED IN [facility])  LACTIC ACID, PLASMA  APTT  PROTIME-INR  LACTIC ACID, PLASMA  SEDIMENTATION RATE    EKG EKG Interpretation  Date/Time:  Monday Jun 30 2019 10:49:22 EDT Ventricular Rate:  88 PR Interval:    QRS Duration: 116 QT Interval:  319 QTC Calculation: 386 R Axis:   -52 Text Interpretation: Sinus rhythm Left anterior fascicular block Anterior infarct, old No old tracing to compare Confirmed by Melene Plan [phone]) on 06/30/2019 10:58:41 AM   Radiology DG Chest Port 1 View  Result Date: 06/30/2019 CLINICAL DATA:  Fever, altered  mental status EXAM: PORTABLE CHEST 1 VIEW COMPARISON:  06/26/2018 FINDINGS: Low lung volumes. Small rounded density is again identified at the right base. There is minimal patchy density at the right lung base. Stable cardiomediastinal contours no pleural effusion or pneumothorax. IMPRESSION: Minimal patchy atelectasis/consolidation at the base. Small rounded density again identified also at the right base and may reflect a calcified granuloma Electronically  Signed   By: Guadlupe Spanish M.D.   On: 06/30/2019 11:11    Procedures Procedures (including critical care time)  Medications Ordered in ED Medications  lactated ringers bolus 1,000 mL (has no administration in time range)  lactated ringers bolus 1,000 mL (1,000 mLs Intravenous New Bag/Given 06/30/19 1101)  acetaminophen (TYLENOL) tablet 1,000 mg (1,000 mg Oral Given 06/30/19 1151)    ED Course  I have reviewed the triage vital signs and the nursing notes.  Pertinent labs & imaging results that were available during my care of the patient were reviewed by me and considered in my medical decision making (see chart for details).    MDM Rules/Calculators/A&P                      79 yo M with a chief complaints of a fever.  Patient is 14 days postop from a microdiscectomy done at an outpatient surgical center here in [place].  Temperature 102.8 rectally here.  We will send off sepsis lab work.  Patient does have purulent drainage from his incision site, will discuss with neurosurgery.  Neurosurgery has evaluated the bedside and will admit the patient to the hospital.  They are asking with his multiple medical conditions will hospitalist be consulted.  The patients results and plan were reviewed and discussed.   Any x-rays performed were independently reviewed by myself.   Differential diagnosis were considered with the presenting HPI.  Medications  lactated ringers bolus 1,000 mL (has no administration in time range)  lactated  ringers bolus 1,000 mL (1,000 mLs Intravenous New Bag/Given 06/30/19 1101)  acetaminophen (TYLENOL) tablet 1,000 mg (1,000 mg Oral Given 06/30/19 1151)    Vitals:   06/30/19 1200 06/30/19 1215 06/30/19 1230 06/30/19 1245  BP: (!) 104/49 (!) 100/46 (!) 102/47 (!) 96/49  Pulse: 89 88 86 100  Resp: (!) 23 (!) 25 (!) 26 (!) 25  Temp:    (!) 100.5 F (38.1 C)  TempSrc:    Oral  SpO2: 96% 96% 97% 97%  Height:        Final diagnoses:  SIRS (systemic inflammatory response syndrome) (HCC)    Admission/ observation were discussed with the admitting physician, patient and/or family and they are comfortable with the plan.   Final Clinical Impression(s) / ED Diagnoses Final diagnoses:  SIRS (systemic inflammatory response syndrome) [facility])    Rx / DC Orders ED Discharge Orders    None       Melene Plan, DO 06/30/19 1335

## 2019-06-30 NOTE — ED Notes (Signed)
PLACED A CONDOM CATH ON PATIENT PT IS NOW RESTING WITH CALL BELL IN REACH AND FAMILY AT BEDSIDE AND CHANGED PATIENT BED

## 2019-06-30 NOTE — ED Triage Notes (Signed)
Pt from home via ems; pt had back sx of May 10th; per wife, on the 20th pt began having fever and increased drainage from incision site; clear fluid; no swelling or redness to site; increased confusion over weekend; pt was evaluated on the 20th and discharged, told to follow up with surgeon; wife has been unable to get in contact with surgeon; pt a and o x 1; normally a and o x 4; pt c/o lower back pain; wife gave oxy at 2100 last night, tylenol at 0600 this am; received 800 mL NS PTA   100.30F temporal BP 132/58 HR 86  RR 34 97% RA CO2 28 cbg 198

## 2019-06-30 NOTE — H&P (Addendum)
Chief Complaint   Chief Complaint  Patient presents with  . Fever  . Altered Mental Status    HPI    HPI: Neil Cordova is a 79 y.o. male who underwent left L4-5 microdiscectomy by Dr Conchita Paris at the outpatient surgical center on 06/17/2019 who presents today with wound drainage and altered mental status. Wife is present at bedside and assists with history. This past Thursday, he started to develop severe incision pain and "swelling" at the surgical site. They called the office and sent a picture of the wound, which we reviewed and noted that it was normal. We recommended they follow up Monday, to which the family declined. They presented to the ED later that evening on the same day, but left AMA. They called again Friday, but we were unable to get a hold of them to return their phone call and discuss their concerns. Today, patient was so weak, his wife called EMS and he was taken to the ED for evaluation. Upon ED arrival, he was noted to be febrile 102.8 with soft BP. Sepsis protocol initiated. I was called by EDP who noted purulent discharge from wound. I asked to see the patient and his wound prior to determining the need for antibiotics/imaging. Patient complains mostly of back pain at incision. Pre op radicular pain resolved. No N/T/W in legs. Vomited Friday, but no other episodes of N/V. Has been taking Tylenol at home for fever with relief. No bowel/bladder dysfunction.  Patient Active Problem List   Diagnosis Date Noted  . Wound infection after surgery 06/30/2019    PMH: Past Medical History:  Diagnosis Date  . Hypertension     PSH: Past Surgical History:  Procedure Laterality Date  . BACK SURGERY      (Not in a hospital admission)   SH: Social History   Tobacco Use  . Smoking status: Not on file  Substance Use Topics  . Alcohol use: Not on file  . Drug use: Not on file    MEDS: Prior to Admission medications   Medication Sig Start Date End Date Taking?  Authorizing Provider  ALPRAZolam Prudy Feeler) 1 MG tablet Take 1 mg by mouth at bedtime.   Yes [provider]  fluticasone (FLONASE) 50 MCG/ACT nasal spray Place 1 spray into both nostrils daily as needed for allergies or rhinitis.   Yes [provider]  ibuprofen (ADVIL) 200 MG tablet Take 200 mg by mouth every 6 (six) hours as needed for mild pain or moderate pain.   Yes [provider]  lisinopril (ZESTRIL) 10 MG tablet Take 10 mg by mouth every evening.   Yes [provider]  loratadine (CLARITIN) 10 MG tablet Take 10 mg by mouth daily as needed for allergies.   Yes [provider]  PARoxetine (PAXIL) 30 MG tablet Take 30 mg by mouth every evening.   Yes [provider]  sildenafil (VIAGRA) 100 MG tablet Take 100 mg by mouth daily as needed for erectile dysfunction.   Yes [provider]  Turmeric 500 MG TABS Take 500 mg by mouth daily.   Yes [provider]    ALLERGY: No Known Allergies  Social History   Tobacco Use  . Smoking status: Not on file  Substance Use Topics  . Alcohol use: Not on file     No family history on file.   ROS   Review of Systems  Constitutional: Positive for chills, fever (103.1 max at home) and malaise/fatigue.  HENT: Negative.  Eyes: Negative.  Negative for blurred vision, double vision and photophobia.  Gastrointestinal: Positive for nausea and vomiting.  Genitourinary: Negative.   Musculoskeletal: Positive for back pain and myalgias. Negative for falls and neck pain.  Neurological: Negative for dizziness, tingling, tremors, sensory change, speech change, focal weakness, seizures, loss of consciousness and headaches.  Endo/Heme/Allergies: Negative.     Exam   Vitals:   06/30/19 1230 06/30/19 1245  BP: (!) 102/47 (!) 96/49  Pulse: 86 100  Resp: (!) 26 (!) 25  Temp:  (!) 100.5 F (38.1 C)  SpO2: 97% 97%   General appearance:  Elderly male, appears uncomfortable although  not overtly toxic Eyes: No scleral injection Cardiovascular: Regular rate and rhythm without murmurs, rubs, gallops. No edema or variciosities. Distal pulses normal. Pulmonary: Effort normal, non-labored breathing Musculoskeletal:     Muscle tone upper extremities: Normal    Muscle tone lower extremities: Normal    Motor exam: MAEW, generalized weakness, but not focal Neurological Mental Status:    - Patient is awake, alert, oriented to person, place, month, year, and situation    - Patient is able to give a clear and coherent history.    - No signs of aphasia or neglect Cranial Nerves    - II: Visual Fields are full. PERRL    - III/IV/VI: EOMI without ptosis or diploplia.     - V: Facial sensation is grossly normal    - VII: Facial movement is symmetric.     - VIII: hearing is intact to voice    - X: Uvula elevates symmetrically    - XI: Shoulder shrug is symmetric.    - XII: tongue is midline without atrophy or fasciculations.  Sensory: Sensation grossly intact to LT  Lumbar wound Wound dehiscence without surrounding erythema/warmth. No active drainage. With palpation there is a small amount of purulent discharge expelled.   Results - Imaging/Labs   Results for orders placed or performed during the hospital encounter of 06/30/19 (from the past 48 hour(s))  Lactic acid, plasma     Status: None   Collection Time: 06/30/19 10:55 AM  Result Value Ref Range   Lactic Acid, Venous 1.7 0.5 - 1.9 mmol/L    Comment: Performed at [facility], [address], [place], [place] [address]  Comprehensive metabolic panel     Status: Abnormal   Collection Time: 06/30/19 10:55 AM  Result Value Ref Range   Sodium 127 (L) 135 - 145 mmol/L   Potassium 4.6 3.5 - 5.1 mmol/L   Chloride 93 (L) 98 - 111 mmol/L   CO2 22 22 - 32 mmol/L   Glucose, Bld 153 (H) 70 - 99 mg/dL    Comment: Glucose reference range applies only to samples taken after fasting for at least 8 hours.   BUN 41 (H) 8 - 23  mg/dL   Creatinine, Ser 1.44 (H) 0.61 - 1.24 mg/dL   Calcium 8.8 (L) 8.9 - 10.3 mg/dL   Total Protein 5.3 (L) 6.5 - 8.1 g/dL   Albumin 2.3 (L) 3.5 - 5.0 g/dL   AST 29 15 - 41 U/L   ALT 23 0 - 44 U/L   Alkaline Phosphatase 76 38 - 126 U/L   Total Bilirubin 1.1 0.3 - 1.2 mg/dL   GFR calc non Af Amer 46 (L) >60 mL/min   GFR calc Af Amer 54 (L) >60 mL/min   Anion gap 12 5 - 15    Comment: Performed at [facility]  Lab, [address] [place], [place] [address]  CBC WITH DIFFERENTIAL     Status: Abnormal   Collection Time: 06/30/19 10:55 AM  Result Value Ref Range   WBC 7.4 4.0 - 10.5 K/uL   RBC 3.37 (L) 4.22 - 5.81 MIL/uL   Hemoglobin 10.1 (L) 13.0 - 17.0 g/dL   HCT 19.1 (L) 47.8 - 29.5 %   MCV 90.2 80.0 - 100.0 fL   MCH 30.0 26.0 - 34.0 pg   MCHC 33.2 30.0 - 36.0 g/dL   RDW 62.1 30.8 - 65.7 %   Platelets 166 150 - 400 K/uL   nRBC 0.0 0.0 - 0.2 %   Neutrophils Relative % 88 %   Neutro Abs 6.5 1.7 - 7.7 K/uL   Lymphocytes Relative 2 %   Lymphs Abs 0.2 (L) 0.7 - 4.0 K/uL   Monocytes Relative 8 %   Monocytes Absolute 0.6 0.1 - 1.0 K/uL   Eosinophils Relative 0 %   Eosinophils Absolute 0.0 0.0 - 0.5 K/uL   Basophils Relative 1 %   Basophils Absolute 0.1 0.0 - 0.1 K/uL   WBC Morphology See Note     Comment: Mild Left Shift. 1 to 5% Metas and Myelos, Occ Pro Noted.   Immature Granulocytes 1 %   Abs Immature Granulocytes 0.07 0.00 - 0.07 K/uL    Comment: Performed at [facility], [address], [place], [place] [address]  APTT     Status: None   Collection Time: 06/30/19 10:55 AM  Result Value Ref Range   aPTT 27 24 - 36 seconds    Comment: Performed at [facility], [address], [place], [place] [address]  Protime-INR     Status: None   Collection Time: 06/30/19 10:55 AM  Result Value Ref Range   Prothrombin Time 13.3 11.4 - 15.2 seconds   INR 1.1 0.8 - 1.2    Comment: (NOTE) INR goal varies based on device and disease states. Performed at [facility], [address], [place], [place] [address]   Urinalysis, Routine w reflex microscopic     Status: Abnormal   Collection Time: 06/30/19 10:55 AM  Result Value Ref Range   Color, Urine YELLOW YELLOW   APPearance HAZY (A) CLEAR   Specific Gravity, Urine 1.018 1.005 - 1.030   pH 5.0 5.0 - 8.0   Glucose, UA NEGATIVE NEGATIVE mg/dL   Hgb urine dipstick LARGE (A) NEGATIVE   Bilirubin Urine NEGATIVE NEGATIVE   Ketones, ur NEGATIVE NEGATIVE mg/dL   Protein, ur 401 (A) NEGATIVE mg/dL   Nitrite NEGATIVE NEGATIVE   Leukocytes,Ua NEGATIVE NEGATIVE   RBC / HPF 0-5 0 - 5 RBC/hpf   WBC, UA 0-5 0 - 5 WBC/hpf   Bacteria, UA FEW (A) NONE SEEN   Squamous Epithelial / LPF 0-5 0 - 5   Mucus PRESENT    Granular Casts, UA PRESENT     Comment: Performed at [facility], [address], [place], [place] [address]  SARS Coronavirus 2 by RT PCR (hospital order, performed in [facility] hospital lab) Nasopharyngeal Nasopharyngeal Swab     Status: None   Collection Time: 06/30/19 11:49 AM   Specimen: Nasopharyngeal Swab  Result Value Ref Range   SARS Coronavirus 2 NEGATIVE NEGATIVE    Comment: (NOTE) SARS-CoV-2 target nucleic acids are NOT DETECTED. The SARS-CoV-2 RNA is generally detectable in upper and lower respiratory specimens during the acute phase of infection. The lowest  concentration of SARS-CoV-2 viral copies this assay can detect is 250 copies / mL. A negative result does not preclude SARS-CoV-2 infection and should not be used as the sole basis for treatment or other patient management decisions.  A negative result may occur with improper specimen collection / handling, submission of specimen other than nasopharyngeal swab, presence of viral mutation(s) within the areas targeted by this assay, and inadequate number of viral copies (<250 copies / mL). A negative result must be combined with clinical observations, patient history, and epidemiological information. Fact Sheet for  Patients:   BoilerBrush.com.cy Fact Sheet for Healthcare Providers: https://pope.com/ This test is not yet approved or cleared  by the [place] FDA and has been authorized for detection and/or diagnosis of SARS-CoV-2 by FDA under an Emergency Use Authorization (EUA).  This EUA will remain in effect (meaning this test can be used) for the duration of the COVID-19 declaration under Section 564(b)(1) of the Act, 21 U.S.C. section 360bbb-3(b)(1), unless the authorization is terminated or revoked sooner. Performed at [facility], [address], [place], [place] [address]   C-reactive protein     Status: Abnormal   Collection Time: 06/30/19 11:49 AM  Result Value Ref Range   CRP 27.2 (H) <1.0 mg/dL    Comment: Performed at [facility], [address] [place], [place] [address]    DG Chest Port 1 View  Result Date: 06/30/2019 CLINICAL DATA:  Fever, altered mental status EXAM: PORTABLE CHEST 1 VIEW COMPARISON:  06/26/2018 FINDINGS: Low lung volumes. Small rounded density is again identified at the right base. There is minimal patchy density at the right lung base. Stable cardiomediastinal contours no pleural effusion or pneumothorax. IMPRESSION: Minimal patchy atelectasis/consolidation at the base. Small rounded density again identified also at the right base and may reflect a calcified granuloma Electronically Signed   By: Guadlupe Spanish M.D.   On: 06/30/2019 11:11   Impression/Plan   79 y.o. male 2 weeks s/p left L4-5 microdiskectomy who presents with lumbar wound infection. Generalized weakness on exam, although non-focal. No indication for imaging at this time.  Also noted to be dehydrated with borderline AKI, hyponatremic, CXR with "Minimal patchy atelectasis/consolidation at the base".  Lumbar wound infection - Will need to undergo wound washout. Plan for tomorrow am.  - Okay for diet now. NPO at midnight. Communicated  with wife and patient. - given labile BP, temp 102.8, will start on empiric Vanc & Cefepime prior to washout. I obtained bedside cultures.  We have asked the ED to consult TH for management of fluid resuscitation, electrolyte correction. Appreciate their assistance.  Cindra Presume, PA-C [facility]

## 2019-06-30 NOTE — Progress Notes (Signed)
Pharmacy Antibiotic Note  Neil Cordova is a 79 y.o. male admitted on 06/30/2019 with wound infection. Pharmacy has been consulted for vancomycin and cefepime dosing. Pt is febrile and WBC is elevated at 102.8 and WBC is WNL. Scr is elevated at 1.44. Planning wound exploration tomorrow.   Plan: Vancomycin 1250mg  IV Q24H Cefepime 2g IV Q12H F/u renal fxn, C&S, clinical status and trough at SS  Height: 5\' 10"  (177.8 cm) Weight: 66.7 kg (147 lb 0.8 oz) IBW/kg (Calculated) : 73  Temp (24hrs), Avg:101 F (38.3 C), Min:99.6 F (37.6 C), Max:102.8 F (39.3 C)  Recent Labs  Lab 06/26/19 2020 06/30/19 1055  WBC 10.2 7.4  CREATININE 1.28* 1.44*  LATICACIDVEN 1.7 1.7    Estimated Creatinine Clearance: 39.9 mL/min (A) (by C-G formula based on SCr of 1.44 mg/dL (H)).    No Known Allergies  Antimicrobials this admission: Vanc 5/24>> Cefepime 5/24>>  Dose adjustments this admission: N/A  Microbiology results: Pending  Thank you for allowing pharmacy to be a part of this patient's care.  Neil Cordova, 06/28/19 06/30/2019 2:04 PM

## 2019-06-30 NOTE — ED Notes (Signed)
Noted pt spO2 87% on RA after receiving dilaudid, provided 2L O2 , 95%. Pt does not wear cpap normally but does "snore loudly every night" at home.

## 2019-06-30 NOTE — ED Notes (Signed)
Got patient undress on the monitor did ekg shown to Dr Adela Lank patient is resting with family at bedside and call bell in reach

## 2019-07-01 ENCOUNTER — Encounter (HOSPITAL_COMMUNITY): Payer: Self-pay | Admitting: Neurosurgery

## 2019-07-01 ENCOUNTER — Inpatient Hospital Stay (HOSPITAL_COMMUNITY): Payer: Medicare Other | Admitting: Certified Registered Nurse Anesthetist

## 2019-07-01 ENCOUNTER — Encounter (HOSPITAL_COMMUNITY)
Admission: EM | Disposition: A | Discharge status: Home Health | Payer: Self-pay | Source: Home / Self Care | Attending: Neurosurgery

## 2019-07-01 DIAGNOSIS — L8990 Pressure ulcer of unspecified site, unspecified stage: Secondary | ICD-10-CM | POA: Insufficient documentation

## 2019-07-01 HISTORY — PX: LUMBAR WOUND DEBRIDEMENT: SHX1988

## 2019-07-01 LAB — BLOOD CULTURE ID PANEL (REFLEXED)

## 2019-07-01 LAB — SEDIMENTATION RATE: Sed Rate: 50 mm/hr — ABNORMAL HIGH (ref 0–16)

## 2019-07-01 LAB — MRSA PCR SCREENING: MRSA by PCR: NEGATIVE

## 2019-07-01 LAB — GLUCOSE, CAPILLARY: Glucose-Capillary: 147 mg/dL — ABNORMAL HIGH (ref 70–99)

## 2019-07-01 SURGERY — LUMBAR WOUND DEBRIDEMENT
Anesthesia: General | Site: Spine Lumbar

## 2019-07-01 MED ORDER — SODIUM CHLORIDE 0.9 % IV SOLN
2.0000 g | Freq: Two times a day (BID) | INTRAVENOUS | Status: DC
  Administered 2019-07-01: 2 g via INTRAVENOUS
  Filled 2019-07-01 (×2): qty 2

## 2019-07-01 MED ORDER — FENTANYL CITRATE (PF) 100 MCG/2ML IJ SOLN
INTRAMUSCULAR | Status: DC | PRN
  Administered 2019-07-01: 100 ug via INTRAVENOUS
  Administered 2019-07-01: 50 ug via INTRAVENOUS

## 2019-07-01 MED ORDER — OXYCODONE HCL 5 MG PO TABS
5.0000 mg | ORAL_TABLET | Freq: Once | ORAL | Status: AC | PRN
  Administered 2019-07-01: 5 mg via ORAL

## 2019-07-01 MED ORDER — BACITRACIN ZINC 500 UNIT/GM EX OINT
TOPICAL_OINTMENT | CUTANEOUS | Status: AC
  Filled 2019-07-01: qty 28.35

## 2019-07-01 MED ORDER — EPHEDRINE SULFATE-NACL 50-0.9 MG/10ML-% IV SOSY
PREFILLED_SYRINGE | INTRAVENOUS | Status: DC | PRN
  Administered 2019-07-01: 5 mg via INTRAVENOUS
  Administered 2019-07-01: 10 mg via INTRAVENOUS

## 2019-07-01 MED ORDER — FENTANYL CITRATE (PF) 100 MCG/2ML IJ SOLN: 25.0000 ug | INTRAMUSCULAR | Status: DC | PRN

## 2019-07-01 MED ORDER — PROMETHAZINE HCL 25 MG/ML IJ SOLN: 6.2500 mg | INTRAMUSCULAR | Status: DC | PRN

## 2019-07-01 MED ORDER — PROPOFOL 10 MG/ML IV BOLUS
INTRAVENOUS | Status: DC | PRN
  Administered 2019-07-01: 10 mg via INTRAVENOUS
  Administered 2019-07-01: 80 mg via INTRAVENOUS

## 2019-07-01 MED ORDER — ONDANSETRON HCL 4 MG/2ML IJ SOLN
INTRAMUSCULAR | Status: AC
  Filled 2019-07-01: qty 2

## 2019-07-01 MED ORDER — OXYCODONE HCL 5 MG PO TABS
ORAL_TABLET | ORAL | Status: AC
  Filled 2019-07-01: qty 1

## 2019-07-01 MED ORDER — OXYCODONE HCL 5 MG/5ML PO SOLN: 5.0000 mg | Freq: Once | ORAL | Status: AC | PRN

## 2019-07-01 MED ORDER — LIDOCAINE 2% (20 MG/ML) 5 ML SYRINGE
INTRAMUSCULAR | Status: AC
  Filled 2019-07-01: qty 5

## 2019-07-01 MED ORDER — CEFAZOLIN SODIUM-DEXTROSE 2-4 GM/100ML-% IV SOLN
2.0000 g | Freq: Three times a day (TID) | INTRAVENOUS | Status: DC
  Administered 2019-07-01 – 2019-07-03 (×7): 2 g via INTRAVENOUS
  Filled 2019-07-01 (×7): qty 100

## 2019-07-01 MED ORDER — ROCURONIUM BROMIDE 10 MG/ML (PF) SYRINGE
PREFILLED_SYRINGE | INTRAVENOUS | Status: DC | PRN
  Administered 2019-07-01: 50 mg via INTRAVENOUS
  Administered 2019-07-01 (×2): 10 mg via INTRAVENOUS

## 2019-07-01 MED ORDER — THROMBIN 5000 UNITS EX SOLR
CUTANEOUS | Status: AC
  Filled 2019-07-01: qty 5000

## 2019-07-01 MED ORDER — CHLORHEXIDINE GLUCONATE 0.12 % MT SOLN
15.0000 mL | Freq: Once | OROMUCOSAL | Status: AC
  Filled 2019-07-01: qty 15

## 2019-07-01 MED ORDER — ACETAMINOPHEN 500 MG PO TABS
1000.0000 mg | ORAL_TABLET | Freq: Once | ORAL | Status: AC
  Administered 2019-07-01: 1000 mg via ORAL
  Filled 2019-07-01: qty 2

## 2019-07-01 MED ORDER — SUGAMMADEX SODIUM 200 MG/2ML IV SOLN
INTRAVENOUS | Status: DC | PRN
  Administered 2019-07-01: 200 mg via INTRAVENOUS

## 2019-07-01 MED ORDER — LACTATED RINGERS IV SOLN
INTRAVENOUS | Status: DC | PRN
Start: 2019-07-01 — End: 2019-07-01

## 2019-07-01 MED ORDER — LACTATED RINGERS IV BOLUS
1000.0000 mL | Freq: Once | INTRAVENOUS | Status: AC
  Administered 2019-07-01: 1000 mL via INTRAVENOUS

## 2019-07-01 MED ORDER — ROCURONIUM BROMIDE 10 MG/ML (PF) SYRINGE
PREFILLED_SYRINGE | INTRAVENOUS | Status: AC
  Filled 2019-07-01: qty 10

## 2019-07-01 MED ORDER — BACITRACIN ZINC 500 UNIT/GM EX OINT
TOPICAL_OINTMENT | CUTANEOUS | Status: DC | PRN
  Administered 2019-07-01: 1 via TOPICAL

## 2019-07-01 MED ORDER — DEXAMETHASONE SODIUM PHOSPHATE 10 MG/ML IJ SOLN
INTRAMUSCULAR | Status: AC
  Filled 2019-07-01: qty 1

## 2019-07-01 MED ORDER — BUPIVACAINE HCL (PF) 0.5 % IJ SOLN
INTRAMUSCULAR | Status: AC
  Filled 2019-07-01: qty 30

## 2019-07-01 MED ORDER — SODIUM CHLORIDE 0.9 % IV SOLN
INTRAVENOUS | Status: DC | PRN
  Administered 2019-07-01: 500 mL

## 2019-07-01 MED ORDER — FENTANYL CITRATE (PF) 250 MCG/5ML IJ SOLN
INTRAMUSCULAR | Status: AC
  Filled 2019-07-01: qty 5

## 2019-07-01 MED ORDER — LIDOCAINE 2% (20 MG/ML) 5 ML SYRINGE
INTRAMUSCULAR | Status: DC | PRN
  Administered 2019-07-01: 80 mg via INTRAVENOUS

## 2019-07-01 MED ORDER — ONDANSETRON HCL 4 MG/2ML IJ SOLN
INTRAMUSCULAR | Status: DC | PRN
  Administered 2019-07-01: 4 mg via INTRAVENOUS

## 2019-07-01 MED ORDER — LACTATED RINGERS IV SOLN: INTRAVENOUS | Status: DC | PRN

## 2019-07-01 MED ORDER — PHENYLEPHRINE HCL-NACL 10-0.9 MG/250ML-% IV SOLN
INTRAVENOUS | Status: DC | PRN
Start: 2019-07-01 — End: 2019-07-01
  Administered 2019-07-01: 30 ug/min via INTRAVENOUS

## 2019-07-01 MED ORDER — LIDOCAINE-EPINEPHRINE 1 %-1:100000 IJ SOLN
INTRAMUSCULAR | Status: AC
  Filled 2019-07-01: qty 1

## 2019-07-01 MED ORDER — 0.9 % SODIUM CHLORIDE (POUR BTL) OPTIME
TOPICAL | Status: DC | PRN
  Administered 2019-07-01: 1000 mL

## 2019-07-01 MED ORDER — SODIUM CHLORIDE 0.9 % IV BOLUS
500.0000 mL | Freq: Once | INTRAVENOUS | Status: AC
  Administered 2019-07-01: 500 mL via INTRAVENOUS

## 2019-07-01 MED ORDER — CEFAZOLIN SODIUM 1 G IJ SOLR
INTRAMUSCULAR | Status: AC
  Filled 2019-07-01: qty 20

## 2019-07-01 MED ORDER — CHLORHEXIDINE GLUCONATE 0.12 % MT SOLN
OROMUCOSAL | Status: AC
  Administered 2019-07-01: 15 mL via OROMUCOSAL
  Filled 2019-07-01: qty 15

## 2019-07-01 SURGICAL SUPPLY — 39 items
BAG DECANTER FOR FLEXI CONT (MISCELLANEOUS) ×2 IMPLANT
CANISTER SUCT 3000ML PPV (MISCELLANEOUS) ×2 IMPLANT
CARTRIDGE OIL MAESTRO DRILL (MISCELLANEOUS) ×1 IMPLANT
COVER WAND RF STERILE (DRAPES) ×1 IMPLANT
DIFFUSER DRILL AIR PNEUMATIC (MISCELLANEOUS) ×1 IMPLANT
DRAPE LAPAROTOMY 100X72X124 (DRAPES) ×2 IMPLANT
DRSG OPSITE POSTOP 3X4 (GAUZE/BANDAGES/DRESSINGS) ×1 IMPLANT
ELECT REM PT RETURN 9FT ADLT (ELECTROSURGICAL) ×2
ELECTRODE REM PT RTRN 9FT ADLT (ELECTROSURGICAL) ×1 IMPLANT
GAUZE 4X4 16PLY RFD (DISPOSABLE) IMPLANT
GAUZE SPONGE 4X4 12PLY STRL (GAUZE/BANDAGES/DRESSINGS) IMPLANT
GLOVE BIO SURGEON STRL SZ7.5 (GLOVE) ×2 IMPLANT
GLOVE BIOGEL PI IND STRL 7.5 (GLOVE) ×2 IMPLANT
GLOVE BIOGEL PI INDICATOR 7.5 (GLOVE) ×3
GLOVE ECLIPSE 7.0 STRL STRAW (GLOVE) ×2 IMPLANT
GLOVE ECLIPSE 7.5 STRL STRAW (GLOVE) ×1 IMPLANT
GLOVE EXAM NITRILE XL STR (GLOVE) IMPLANT
GOWN STRL REUS W/ TWL LRG LVL3 (GOWN DISPOSABLE) ×1 IMPLANT
GOWN STRL REUS W/ TWL XL LVL3 (GOWN DISPOSABLE) IMPLANT
GOWN STRL REUS W/TWL 2XL LVL3 (GOWN DISPOSABLE) IMPLANT
GOWN STRL REUS W/TWL LRG LVL3 (GOWN DISPOSABLE) ×2
GOWN STRL REUS W/TWL XL LVL3 (GOWN DISPOSABLE) ×1
KIT BASIN OR (CUSTOM PROCEDURE TRAY) ×2 IMPLANT
KIT TURNOVER KIT B (KITS) ×2 IMPLANT
NEEDLE HYPO 22GX1.5 SAFETY (NEEDLE) ×2 IMPLANT
NS IRRIG 1000ML POUR BTL (IV SOLUTION) ×2 IMPLANT
OIL CARTRIDGE MAESTRO DRILL (MISCELLANEOUS)
PACK LAMINECTOMY NEURO (CUSTOM PROCEDURE TRAY) ×2 IMPLANT
PAD ARMBOARD 7.5X6 YLW CONV (MISCELLANEOUS) ×8 IMPLANT
SPONGE SURGIFOAM ABS GEL SZ50 (HEMOSTASIS) IMPLANT
STAPLER VISISTAT 35W (STAPLE) ×1 IMPLANT
SUT VIC AB 0 CT1 18XCR BRD8 (SUTURE) ×1 IMPLANT
SUT VIC AB 0 CT1 8-18 (SUTURE) ×2
SUT VICRYL 3-0 RB1 18 ABS (SUTURE) ×4 IMPLANT
SWAB COLLECTION DEVICE MRSA (MISCELLANEOUS) ×1 IMPLANT
SWAB CULTURE ESWAB REG 1ML (MISCELLANEOUS) ×1 IMPLANT
TOWEL GREEN STERILE (TOWEL DISPOSABLE) ×2 IMPLANT
TOWEL GREEN STERILE FF (TOWEL DISPOSABLE) ×2 IMPLANT
WATER STERILE IRR 1000ML POUR (IV SOLUTION) ×2 IMPLANT

## 2019-07-01 NOTE — Transfer of Care (Signed)
Immediate Anesthesia Transfer of Care Note  Patient: Neil Cordova  Procedure(s) Performed: LUMBAR WOUND EXPLORATION (N/A Spine Lumbar)  Patient Location: PACU  Anesthesia Type:General  Level of Consciousness: awake, alert  and oriented  Airway & Oxygen Therapy: Patient Spontanous Breathing and Patient connected to nasal cannula oxygen  Post-op Assessment: Report given to RN, Post -op Vital signs reviewed and stable and Patient moving all extremities  Post vital signs: Reviewed and stable  Last Vitals:  Vitals Value Taken Time  BP 86/46 07/01/19 1239  Temp 36.2 C 07/01/19 1235  Pulse 63 07/01/19 1242  Resp 21 07/01/19 1242  SpO2 96 % 07/01/19 1242  Vitals shown include unvalidated device data.  Last Pain:  Vitals:   07/01/19 1235  TempSrc:   PainSc: Asleep         Complications: No apparent anesthesia complications

## 2019-07-01 NOTE — Progress Notes (Signed)
Pt came to OR with right radial art line which Dr Stephannie Peters told Harold Barban RN could be dc'd when pt left PACU if stable. Art line dc'd prior to dc-site CDI, tip intact, pressure dsg on.

## 2019-07-01 NOTE — Progress Notes (Signed)
Pt transported to short stay 

## 2019-07-01 NOTE — Progress Notes (Signed)
When RN checked Pts temp, thermometer would not read. RN took Pts rectal temp it was 97.0. PA messaged and aware.

## 2019-07-01 NOTE — Progress Notes (Addendum)
PHARMACY - PHYSICIAN COMMUNICATION CRITICAL VALUE ALERT - BLOOD CULTURE IDENTIFICATION (BCID)  Neil Cordova is an 79 y.o. male who presented to [facility] on 06/30/2019 with a chief complaint of lumbar infection  Assessment:  Pt with 4/4 blood cx bottles growing MSSA (source - lumbar infection)  Name of physician (or Provider) ContactedDoran Durand  Current antibiotics: Vancomycin and Cefepime  Changes to prescribed antibiotics recommended:  Response not received from provider; current antibiotics will cover the isolated organism. ID will be automatically consulted and will de-escalate antibiotics  Results for orders placed or performed during the hospital encounter of 06/30/19  Blood Culture ID Panel (Reflexed) (Collected: 06/30/2019 10:55 AM)  Result Value Ref Range   Enterococcus species NOT DETECTED NOT DETECTED   Listeria monocytogenes NOT DETECTED NOT DETECTED   Staphylococcus species DETECTED (A) NOT DETECTED   Staphylococcus aureus (BCID) DETECTED (A) NOT DETECTED   Methicillin resistance NOT DETECTED NOT DETECTED   Streptococcus species NOT DETECTED NOT DETECTED   Streptococcus agalactiae NOT DETECTED NOT DETECTED   Streptococcus pneumoniae NOT DETECTED NOT DETECTED   Streptococcus pyogenes NOT DETECTED NOT DETECTED   Acinetobacter baumannii NOT DETECTED NOT DETECTED   Enterobacteriaceae species NOT DETECTED NOT DETECTED   Enterobacter cloacae complex NOT DETECTED NOT DETECTED   Escherichia coli NOT DETECTED NOT DETECTED   Klebsiella oxytoca NOT DETECTED NOT DETECTED   Klebsiella pneumoniae NOT DETECTED NOT DETECTED   Proteus species NOT DETECTED NOT DETECTED   Serratia marcescens NOT DETECTED NOT DETECTED   Haemophilus influenzae NOT DETECTED NOT DETECTED   Neisseria meningitidis NOT DETECTED NOT DETECTED   Pseudomonas aeruginosa NOT DETECTED NOT DETECTED   Candida albicans NOT DETECTED NOT DETECTED   Candida glabrata NOT DETECTED NOT DETECTED   Candida krusei NOT  DETECTED NOT DETECTED   Candida parapsilosis NOT DETECTED NOT DETECTED   Candida tropicalis NOT DETECTED NOT DETECTED    Christoper Fabian, PharmD, BCPS Please see amion for complete clinical pharmacist phone list 07/01/2019  2:13 AM

## 2019-07-01 NOTE — Consult Note (Signed)
[facility]  Total days of antibiotics 2               Reason for Consult: MSSA bacteremia and surgical site infection    Referring Physician: [email or web address]/[email or web address]  Active Problems:   Wound infection after surgery   Hyponatremia   Pressure injury of skin    HPI: Neil Cordova is a 79 y.o. male who underwent L4-L5 microdiscectomy on 5/11 started to develop fever, chills, erythema, and swelling to surgical incision on 5/20. Over the course of the next few days, he had ongoing fevers up to 102, purulent drainage after they had been seen in the ED, but unclear why he wasnt admitted at that time- it sounds like his significant other was not able to explain to the providers his clinical story. She reports that he was having confusion at the time of being seen in the ED on 5/21. He was brought to the ED by EMS on 5/24 for fevers, hypotension, confusion. He was initially started on vancomycin and cefepime with blood cx drawn. His blood cx grew MSSA. He was taken to the OR on 5/25 for wash out by dr Conchita Paris who detailed deep surgical site infection, it did not track into the epidural space. He is still groggy from anesthesia at the time of this exam   Past Medical History:  Diagnosis Date  . Hypertension     Allergies: No Known Allergies  Current antibiotics:   MEDICATIONS: . ALPRAZolam  1 mg Oral QHS  . docusate sodium  100 mg Oral BID  . PARoxetine  30 mg Oral QPM  . sodium chloride flush  3 mL Intravenous Q12H    Social History   Tobacco Use  . Smoking status: Former Games developer  . Smokeless tobacco: Current User    Types: Snuff  Substance Use Topics  . Alcohol use: Not Currently  . Drug use: Not on file    History reviewed. No pertinent family history.   Review of Systems  Constitutional: +fever,Negative for  chills, diaphoresis, activity change, appetite change, fatigue and unexpected weight change.  HENT: Negative for congestion, sore throat,  rhinorrhea, sneezing, trouble swallowing and sinus pressure.  Eyes: Negative for photophobia and visual disturbance.  Respiratory: Negative for cough, chest tightness, shortness of breath, wheezing and stridor.  Cardiovascular: Negative for chest pain, palpitations and leg swelling.  Gastrointestinal: +nausea and vomiting.  Negative for abdominal pain, diarrhea, constipation, blood in stool, abdominal distention and anal bleeding.  Genitourinary: Negative for dysuria, hematuria, flank pain and difficulty urinating.  Musculoskeletal: Negative for myalgias, back pain, joint swelling, arthralgias and gait problem.  Skin: +erythema to back incision. Negative for color change, pallor, rash and wound.  Neurological: Negative for dizziness, tremors, weakness and light-headedness.  Hematological: Negative for adenopathy. Does not bruise/bleed easily.  Psychiatric/Behavioral: Negative for behavioral problems, confusion, sleep disturbance, dysphoric mood, decreased concentration and agitation.     OBJECTIVE: Temp:  [95.8 F (35.4 C)-101.6 F (38.7 C)] 97.7 F (36.5 C) (05/25 1503) Pulse Rate:  [59-105] 64 (05/25 1354) Resp:  [13-30] 20 (05/25 1354) BP: (84-146)/(44-74) 93/73 (05/25 1354) SpO2:  [82 %-100 %] 96 % (05/25 1354) Arterial Line BP: (87-151)/(42-56) 87/47 (05/25 1319) Physical Exam  Constitutional: He is oriented to person, only He appears well-developed and well-nourished. No distress.  HENT:  Mouth/Throat: Oropharynx is clear and moist. No oropharyngeal exudate.  Cardiovascular: Normal rate, regular rhythm and normal heart sounds. Exam reveals no gallop and no friction rub.  No murmur heard.  Pulmonary/Chest: Effort normal and breath sounds normal. No respiratory distress. He has no wheezes.  Abdominal: Soft. Bowel sounds are normal. He exhibits no distension. There is no tenderness.  Lymphadenopathy:  He has no cervical adenopathy.  Neurological: He is alert and oriented to  person,  Skin: Skin is warm and dry. No rash noted. No erythema.  Psychiatric: sleepy   LABS: Results for orders placed or performed during the hospital encounter of 06/30/19 (from the past 48 hour(s))  Lactic acid, plasma     Status: None   Collection Time: 06/30/19 10:55 AM  Result Value Ref Range   Lactic Acid, Venous 1.7 0.5 - 1.9 mmol/L    Comment: Performed at [facility], [address], [place], [place] [address]  Comprehensive metabolic panel     Status: Abnormal   Collection Time: 06/30/19 10:55 AM  Result Value Ref Range   Sodium 127 (L) 135 - 145 mmol/L   Potassium 4.6 3.5 - 5.1 mmol/L   Chloride 93 (L) 98 - 111 mmol/L   CO2 22 22 - 32 mmol/L   Glucose, Bld 153 (H) 70 - 99 mg/dL    Comment: Glucose reference range applies only to samples taken after fasting for at least 8 hours.   BUN 41 (H) 8 - 23 mg/dL   Creatinine, Ser 0.17 (H) 0.61 - 1.24 mg/dL   Calcium 8.8 (L) 8.9 - 10.3 mg/dL   Total Protein 5.3 (L) 6.5 - 8.1 g/dL   Albumin 2.3 (L) 3.5 - 5.0 g/dL   AST 29 15 - 41 U/L   ALT 23 0 - 44 U/L   Alkaline Phosphatase 76 38 - 126 U/L   Total Bilirubin 1.1 0.3 - 1.2 mg/dL   GFR calc non Af Amer 46 (L) >60 mL/Cordova   GFR calc Af Amer 54 (L) >60 mL/Cordova   Anion gap 12 5 - 15    Comment: Performed at [facility], [address], [place], [place] [address]  CBC WITH DIFFERENTIAL     Status: Abnormal   Collection Time: 06/30/19 10:55 AM  Result Value Ref Range   WBC 7.4 4.0 - 10.5 K/uL   RBC 3.37 (L) 4.22 - 5.81 MIL/uL   Hemoglobin 10.1 (L) 13.0 - 17.0 g/dL   HCT 67.5 (L) 91.6 - 38.4 %   MCV 90.2 80.0 - 100.0 fL   MCH 30.0 26.0 - 34.0 pg   MCHC 33.2 30.0 - 36.0 g/dL   RDW 66.5 99.3 - 57.0 %   Platelets 166 150 - 400 K/uL   nRBC 0.0 0.0 - 0.2 %   Neutrophils Relative % 88 %   Neutro Abs 6.5 1.7 - 7.7 K/uL   Lymphocytes Relative 2 %   Lymphs Abs 0.2 (L) 0.7 - 4.0 K/uL   Monocytes Relative 8 %   Monocytes Absolute 0.6 0.1 - 1.0 K/uL   Eosinophils  Relative 0 %   Eosinophils Absolute 0.0 0.0 - 0.5 K/uL   Basophils Relative 1 %   Basophils Absolute 0.1 0.0 - 0.1 K/uL   WBC Morphology See Note     Comment: Mild Left Shift. 1 to 5% Metas and Myelos, Occ Pro Noted.   Immature Granulocytes 1 %   Abs Immature Granulocytes 0.07 0.00 - 0.07 K/uL    Comment: Performed at [facility], [address], [place], [place] [address]  APTT     Status: None   Collection Time: 06/30/19 10:55  AM  Result Value Ref Range   aPTT 27 24 - 36 seconds    Comment: Performed at [facility], [address], [place], [place] [address]  Protime-INR     Status: None   Collection Time: 06/30/19 10:55 AM  Result Value Ref Range   Prothrombin Time 13.3 11.4 - 15.2 seconds   INR 1.1 0.8 - 1.2    Comment: (NOTE) INR goal varies based on device and disease states. Performed at [facility], [address] [place], [place] [address]   Blood Culture (routine x 2)     Status: None (Preliminary result)   Collection Time: 06/30/19 10:55 AM   Specimen: BLOOD RIGHT FOREARM  Result Value Ref Range   Specimen Description BLOOD RIGHT FOREARM    Special Requests      BOTTLES DRAWN AEROBIC AND ANAEROBIC Blood Culture adequate volume   Culture  Setup Time      GRAM POSITIVE COCCI IN CLUSTERS IN BOTH AEROBIC AND ANAEROBIC BOTTLES CRITICAL RESULT CALLED TO, READ BACK BY AND VERIFIED WITH: K. AMEND,PHARMD 0210 07/01/2019 T. TYSOR Performed at [facility], [address] [place], [place] [address]    Culture GRAM POSITIVE COCCI    Report Status PENDING   Urinalysis, Routine w reflex microscopic     Status: Abnormal   Collection Time: 06/30/19 10:55 AM  Result Value Ref Range   Color, Urine YELLOW YELLOW   APPearance HAZY (A) CLEAR   Specific Gravity, Urine 1.018 1.005 - 1.030   pH 5.0 5.0 - 8.0   Glucose, UA NEGATIVE NEGATIVE mg/dL   Hgb urine dipstick LARGE (A) NEGATIVE   Bilirubin Urine NEGATIVE NEGATIVE   Ketones, ur NEGATIVE NEGATIVE  mg/dL   Protein, ur 170 (A) NEGATIVE mg/dL   Nitrite NEGATIVE NEGATIVE   Leukocytes,Ua NEGATIVE NEGATIVE   RBC / HPF 0-5 0 - 5 RBC/hpf   WBC, UA 0-5 0 - 5 WBC/hpf   Bacteria, UA FEW (A) NONE SEEN   Squamous Epithelial / LPF 0-5 0 - 5   Mucus PRESENT    Granular Casts, UA PRESENT     Comment: Performed at [facility], [address] [place], [place] [address]  Urine culture     Status: Abnormal (Preliminary result)   Collection Time: 06/30/19 10:55 AM   Specimen: In/Out Cath Urine  Result Value Ref Range   Specimen Description IN/OUT CATH URINE    Special Requests      NONE Performed at [facility], [address] [place], [place] [address]    Culture 60,000 COLONIES/mL STAPHYLOCOCCUS EPIDERMIDIS (A)    Report Status PENDING   Blood Culture ID Panel (Reflexed)     Status: Abnormal   Collection Time: 06/30/19 10:55 AM  Result Value Ref Range   Enterococcus species NOT DETECTED NOT DETECTED   Listeria monocytogenes NOT DETECTED NOT DETECTED   Staphylococcus species DETECTED (A) NOT DETECTED    Comment: CRITICAL RESULT CALLED TO, READ BACK BY AND VERIFIED WITH: K. AMEND,PHARMD 0210 07/01/2019 T. TYSOR    Staphylococcus aureus (BCID) DETECTED (A) NOT DETECTED    Comment: Methicillin (oxacillin) susceptible Staphylococcus aureus (MSSA). Preferred therapy is anti staphylococcal beta lactam antibiotic (Cefazolin or Nafcillin), unless clinically contraindicated. CRITICAL RESULT CALLED TO, READ BACK BY AND VERIFIED WITH: K. AMEND,PHARMD 0210 07/01/2019 T. TYSOR    Methicillin resistance NOT DETECTED NOT DETECTED   Streptococcus species NOT DETECTED NOT DETECTED   Streptococcus agalactiae NOT DETECTED  NOT DETECTED   Streptococcus pneumoniae NOT DETECTED NOT DETECTED   Streptococcus pyogenes NOT DETECTED NOT DETECTED   Acinetobacter baumannii NOT DETECTED NOT DETECTED   Enterobacteriaceae species NOT DETECTED NOT DETECTED   Enterobacter cloacae complex NOT DETECTED NOT  DETECTED   Escherichia coli NOT DETECTED NOT DETECTED   Klebsiella oxytoca NOT DETECTED NOT DETECTED   Klebsiella pneumoniae NOT DETECTED NOT DETECTED   Proteus species NOT DETECTED NOT DETECTED   Serratia marcescens NOT DETECTED NOT DETECTED   Haemophilus influenzae NOT DETECTED NOT DETECTED   Neisseria meningitidis NOT DETECTED NOT DETECTED   Pseudomonas aeruginosa NOT DETECTED NOT DETECTED   Candida albicans NOT DETECTED NOT DETECTED   Candida glabrata NOT DETECTED NOT DETECTED   Candida krusei NOT DETECTED NOT DETECTED   Candida parapsilosis NOT DETECTED NOT DETECTED   Candida tropicalis NOT DETECTED NOT DETECTED    Comment: Performed at [facility], [place] [address], [place], [place] [address]  SARS Coronavirus 2 by RT PCR (hospital order, performed in [facility] hospital lab) Nasopharyngeal Nasopharyngeal Swab     Status: None   Collection Time: 06/30/19 11:49 AM   Specimen: Nasopharyngeal Swab  Result Value Ref Range   SARS Coronavirus 2 NEGATIVE NEGATIVE    Comment: (NOTE) SARS-CoV-2 target nucleic acids are NOT DETECTED. The SARS-CoV-2 RNA is generally detectable in upper and lower respiratory specimens during the acute phase of infection. The lowest concentration of SARS-CoV-2 viral copies this assay can detect is 250 copies / mL. A negative result does not preclude SARS-CoV-2 infection and should not be used as the sole basis for treatment or other patient management decisions.  A negative result may occur with improper specimen collection / handling, submission of specimen other than nasopharyngeal swab, presence of viral mutation(s) within the areas targeted by this assay, and inadequate number of viral copies (<250 copies / mL). A negative result must be combined with clinical observations, patient history, and epidemiological information. Fact Sheet for Patients:   StrictlyIdeas.no Fact Sheet for Healthcare  Providers: BankingDealers.co.za This test is not yet approved or cleared  by the [place] FDA and has been authorized for detection and/or diagnosis of SARS-CoV-2 by FDA under an Emergency Use Authorization (EUA).  This EUA will remain in effect (meaning this test can be used) for the duration of the COVID-19 declaration under Section 564(b)(1) of the Act, 21 U.S.C. section 360bbb-3(b)(1), unless the authorization is terminated or revoked sooner. Performed at [facility], [place] [address] [place], [place] [address]   C-reactive protein     Status: Abnormal   Collection Time: 06/30/19 11:49 AM  Result Value Ref Range   CRP 27.2 (H) <1.0 mg/dL    Comment: Performed at [place] [address] [place], [place] [address]  Blood Culture (routine x 2)     Status: None (Preliminary result)   Collection Time: 06/30/19 12:00 PM   Specimen: BLOOD RIGHT WRIST  Result Value Ref Range   Specimen Description BLOOD RIGHT WRIST    Special Requests      BOTTLES DRAWN AEROBIC AND ANAEROBIC Blood Culture results may not be optimal due to an inadequate volume of blood received in culture bottles   Culture  Setup Time      GRAM POSITIVE COCCI IN CLUSTERS IN BOTH AEROBIC AND ANAEROBIC BOTTLES CRITICAL RESULT CALLED TO, READ BACK BY AND VERIFIED WITH: K. AMEND,PHARMD 0210 07/01/2019 T. TYSOR Performed at [facility], [place] [address]  [place], [place] [address]    Culture GRAM POSITIVE COCCI    Report Status PENDING   Aerobic/Anaerobic Culture (surgical/deep wound)     Status: None (Preliminary result)   Collection Time: 06/30/19  3:42 PM   Specimen: Wound  Result Value Ref Range   Specimen Description WOUND SITE NOT SPECIFIED    Special Requests NO ANA SWAB    Gram Stain      NO WBC SEEN MODERATE GRAM POSITIVE COCCI IN PAIRS IN CLUSTERS Performed at [facility], [address] [place], [place] [address]    Culture PENDING    Report Status  PENDING   MRSA PCR Screening     Status: None   Collection Time: 06/30/19 10:27 PM   Specimen: Nasopharyngeal  Result Value Ref Range   MRSA by PCR NEGATIVE NEGATIVE    Comment:        The GeneXpert MRSA Assay (FDA approved for NASAL specimens only), is one component of a comprehensive MRSA colonization surveillance program. It is not intended to diagnose MRSA infection nor to guide or monitor treatment for MRSA infections. Performed at [facility], [address] [place], [place] [address]   Sedimentation rate     Status: Abnormal   Collection Time: 07/01/19  2:12 AM  Result Value Ref Range   Sed Rate 50 (H) 0 - 16 mm/hr    Comment: Performed at [facility], [address] [place], [place] [address]  Glucose, capillary     Status: Abnormal   Collection Time: 07/01/19 10:14 AM  Result Value Ref Range   Glucose-Capillary 147 (H) 70 - 99 mg/dL    Comment: Glucose reference range applies only to samples taken after fasting for at least 8 hours.    MICRO: 5/24 blood cx MSSA 5/24 sup wound cx GPC in prs and clusters 5/25 or cx pending  IMAGING: DG Chest Port 1 View  Result Date: 06/30/2019 CLINICAL DATA:  Fever, altered mental status EXAM: PORTABLE CHEST 1 VIEW COMPARISON:  06/26/2018 FINDINGS: Low lung volumes. Small rounded density is again identified at the right base. There is minimal patchy density at the right lung base. Stable cardiomediastinal contours no pleural effusion or pneumothorax. IMPRESSION: Minimal patchy atelectasis/consolidation at the base. Small rounded density again identified also at the right base and may reflect a calcified granuloma Electronically Signed   By: Guadlupe SpanishPraneil  Patel M.D.   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

## 2019-07-01 NOTE — Anesthesia Postprocedure Evaluation (Signed)
Anesthesia Post Note  Patient: Neil Cordova  Procedure(s) Performed: LUMBAR WOUND EXPLORATION (N/A Spine Lumbar)     Patient location during evaluation: PACU Anesthesia Type: General Level of consciousness: awake and alert and oriented Pain management: pain level controlled Vital Signs Assessment: post-procedure vital signs reviewed and stable Respiratory status: spontaneous breathing, nonlabored ventilation and respiratory function stable Cardiovascular status: blood pressure returned to baseline Postop Assessment: no apparent nausea or vomiting Anesthetic complications: no    Last Vitals:  Vitals:   07/01/19 1338 07/01/19 1354  BP: 116/60 93/73  Pulse:  64  Resp: 15 20  Temp: 36.7 C (!) 35.4 C  SpO2:  96%    Last Pain:  Vitals:   07/01/19 1404  TempSrc:   PainSc: 7                  Kaylyn Layer

## 2019-07-01 NOTE — Progress Notes (Signed)
OT Cancellation Note  Patient Details Name: Neil Cordova MRN: [id number] DOB: 1940-05-14   Cancelled Treatment:    Reason Eval/Treat Not Completed: Patient at procedure or test/ unavailable Pt currently being transported off unit. OT will return later as time allows and pt is appropriate.   [facility] OTR/L Acute Rehabilitation Services Office: [phone]   Rebeca Alert 07/01/2019, 10:04 AM

## 2019-07-01 NOTE — Op Note (Signed)
  NEUROSURGERY OPERATIVE NOTE   PREOP DIAGNOSIS:  1. Wound infection 2. Bacteremia   POSTOP DIAGNOSIS: Same  PROCEDURE: 1. Lumbar wound exploration, debridement, and washout  SURGEON: Dr. Lisbeth Renshaw, MD  ASSISTANT: Cindra Presume, PA-C  ANESTHESIA: General Endotracheal  EBL: Minimal  SPECIMENS: Culture swab   DRAINS: None  COMPLICATIONS: None immediate  CONDITION: Hemodynamically stable to PACU  HISTORY: Neil Cordova is a 79 y.o. male who initially was seen in the outpatient neurosurgery clinic with left-sided lumbar radiculopathy and underwent left L4-5 laminotomy and microdiscectomy at the outpatient surgical center approximately 2 weeks ago.  He presented back to the hospital with altered mental status, fever, and drainage from his wound.  His blood cultures were positive for gram-positive cocci, and with his drainage, I recommended exploration, debridement, and washout of his wound.  The risks, benefits, and alternatives were reviewed in detail with the patient and his wife.  After all questions were answered informed consent was obtained and witnessed.  PROCEDURE IN DETAIL: The patient was brought to the operating room. After induction of general anesthesia, the patient was positioned on the operative table in the prone position. All pressure points were meticulously padded. Skin incision was then marked out and prepped and draped in the usual sterile fashion.  After timeout was conducted, the previous incision was opened sharply with scissors.  Immediately underneath the skin, there was a large pocket of purulent material.  Culture swabs were taken of this.  Purulent material was suctioned out, and it was clear it was tracking through the fascia down into the paraspinal musculature.  The fascial stitches were therefore cut and self-retaining retractors were placed.  I was then able to identify the L4 and L5 lamina as well as the laminotomy defect and the thecal sac.   The paraspinal musculature appeared to be beefy red, without any clearly identifiable myositis.  A ball-tipped dissector was then placed into the ventral epidural space and this area was visualized using the microscope.  There was no obvious phlegmon or purulent material in the epidural space.  There was also absolutely no compression of the thecal sac or left L5 nerve root as I was able to freely pass the dissector within the ventral epidural space and underneath the nerve root.  At this point the wound was irrigated with copious amounts of antibiotic saline.  Devitalized fascial, muscular, and subcutaneous tissue was excised sharply using curved Mayo scissors until bleeding tissue was encountered.  Wound was again irrigated with normal saline.  The wound was then closed in 2 layers using interrupted 0 Vicryl stitches.  Skin was closed with staples.  Bacitracin ointment and sterile dressing was applied.  At the end of the case all sponge, needle, instrument, and cottonoid counts were correct.

## 2019-07-01 NOTE — Progress Notes (Signed)
Patient arrives to Short Stay and placed on monitors. Patient alert to person with confusion. VS as noted. NS bolus administered per Dr. Conchita Paris. Dr. Stephannie Peters notified of VS.

## 2019-07-01 NOTE — Anesthesia Preprocedure Evaluation (Addendum)
Anesthesia Evaluation  Patient identified by MRN, date of birth, ID band Patient awake and Patient confused    Reviewed: Allergy & Precautions, NPO status , Patient's Chart, lab work & pertinent test results  History of Anesthesia Complications Negative for: history of anesthetic complications  Airway Mallampati: II  TM Distance: >3 FB Neck ROM: Full    Dental no notable dental hx.    Pulmonary former smoker,    Pulmonary exam normal        Cardiovascular hypertension, Pt. on medications Normal cardiovascular exam     Neuro/Psych S/P L4-5 microdiscectomy on 06/17/19, now with purulent drainage and presumed sepsis negative psych ROS   GI/Hepatic negative GI ROS, Neg liver ROS,   Endo/Other  negative endocrine ROS  Renal/GU Renal disease (Cr 1.44, BUN 41, Na 127)  negative genitourinary   Musculoskeletal negative musculoskeletal ROS (+)   Abdominal   Peds  Hematology  (+) anemia , Hgb 10.1   Anesthesia Other Findings Day of surgery medications reviewed with patient.  Reproductive/Obstetrics negative OB ROS                            Anesthesia Physical Anesthesia Plan  ASA: III and emergent  Anesthesia Plan: General   Post-op Pain Management:    Induction: Intravenous  PONV Risk Score and Plan: 3 and Treatment may vary due to age or medical condition and Ondansetron  Airway Management Planned: Oral ETT  Additional Equipment: Arterial line  Intra-op Plan:   Post-operative Plan: Possible Post-op intubation/ventilation  Informed Consent: I have reviewed the patients History and Physical, chart, labs and discussed the procedure including the risks, benefits and alternatives for the proposed anesthesia with the patient or authorized representative who has indicated his/her understanding and acceptance.     Dental advisory given  Plan Discussed with: CRNA  Anesthesia Plan  Comments:        Anesthesia Quick Evaluation

## 2019-07-01 NOTE — Progress Notes (Signed)
  NEUROSURGERY PROGRESS NOTE   No issues overnight.   EXAM:  BP (!) 84/52   Pulse 62   Temp (!) 97 F (36.1 C) (Rectal)   Resp 13   Ht '5\' 10"'$  (1.778 m)   Wt 66.7 kg   SpO2 100%   BMI 21.10 kg/m   Drowsy but arouses Speech fluent CN grossly intact  5/5 BUE/BLE   LABS: WBC 7.4 CRP 27.2 ESR 50 Blood Cx (+) GPC in pairs  IMPRESSION:  79 y.o. male 2 wks s/p left L4-5 microdiscectomy presenting with SIRS, bacteremia, and wound drainage.  PLAN: - Cont IV Abx - IVF - OR today for wound washout

## 2019-07-01 NOTE — Anesthesia Procedure Notes (Signed)
Procedure Name: Intubation Date/Time: 07/01/2019 11:31 AM Performed by: Leonor Liv, CRNA Pre-anesthesia Checklist: Patient identified, Emergency Drugs available, Suction available and Patient being monitored Patient Re-evaluated:Patient Re-evaluated prior to induction Oxygen Delivery Method: Circle System Utilized Preoxygenation: Pre-oxygenation with 100% oxygen Induction Type: IV induction Ventilation: Mask ventilation without difficulty Laryngoscope Size: Mac and 4 Grade View: Grade II Tube type: Oral Tube size: 7.5 mm Number of attempts: 1 Airway Equipment and Method: Stylet and Oral airway Placement Confirmation: ETT inserted through vocal cords under direct vision,  positive ETCO2 and breath sounds checked- equal and bilateral Secured at: 23 cm Tube secured with: Tape Dental Injury: Teeth and Oropharynx as per pre-operative assessment

## 2019-07-02 ENCOUNTER — Other Ambulatory Visit (HOSPITAL_COMMUNITY): Payer: Medicare Other

## 2019-07-02 LAB — URINE CULTURE: Culture: 60000 — AB

## 2019-07-02 NOTE — Evaluation (Signed)
Physical Therapy Evaluation Patient Details Name: Neil Cordova MRN: [id number] DOB: [birth year] Today's Date: 07/02/2019   History of Present Illness  79 yo male L4-5 microdiscectomy on 5/11 noted to have fever, purulent drainage, cills an swelling. On 5/21 taken to ED due to confusion left AMA per notes.On 5/24 confusion HTN fevers arrived via ambulance from home. Blood cx grew Mssa and taken to OR 5/25 for wash out with Dr Conchita Paris. PMH depression HTN  Clinical Impression  Pt presents to PT with deficits in functional mobility, gait, balance, power, strength, endurance, cognition. Pt is very confused during session, only oriented to person and requires re-orientation multiple times with limited retention. Pt is generally weak, requiring significant assistance to perform all functional mobility. Pt demonstrates no recall of back precautions and requires frequent PT verbal and tactile cues to reinforce precautions. Pt will benefit from continued acute PT POC to improve mobility and to reduce falls risk. PT currently recommending SNF placement at time of discharge as the patient is at a high falls risk and requires significant assistance to mobilize for limited distances.    Follow Up Recommendations SNF    Equipment Recommendations  Wheelchair (measurements PT);Wheelchair cushion (measurements PT);Hospital bed(if home today)    Recommendations for Other Services       Precautions / Restrictions Precautions Precautions: Back Restrictions Weight Bearing Restrictions: No      Mobility  Bed Mobility Overal bed mobility: Needs Assistance Bed Mobility: Rolling;Sidelying to Sit Rolling: Mod assist Sidelying to sit: Max assist          Transfers Overall transfer level: Needs assistance Equipment used: 1 person hand held assist Transfers: Sit to/from [employer] Sit to Stand: Mod assist Stand pivot transfers: Mod assist          Ambulation/Gait                 Stairs            Wheelchair Mobility    Modified Rankin (Stroke Patients Only)       Balance Overall balance assessment: Needs assistance Sitting-balance support: Single extremity supported;Feet supported Sitting balance-Leahy Scale: Poor Sitting balance - Comments: min-modA to maintain sitting balance Postural control: Left lateral lean Standing balance support: Bilateral upper extremity supported Standing balance-Leahy Scale: Poor Standing balance comment: modA to maintain static standing balance and to pivot during transfer                             Pertinent Vitals/Pain Pain Assessment: Faces Faces Pain Scale: Hurts even more Pain Location: back Pain Descriptors / Indicators: Sore Pain Intervention(s): Monitored during session;Patient requesting pain meds-RN notified    Home Living Family/patient expects to be discharged to:: Private residence Living Arrangements: Spouse/significant other Available Help at Discharge: Family Type of Home: House Home Access: Stairs to enter Entrance Stairs-Rails: None(5 steps with rail also available) Entrance Stairs-Number of Steps: 3 Home Layout: One level Home Equipment: (walking stick) Additional Comments: son and daughter that are by marriage but relationship is strong and will help . mother in law. 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, calling , travel     Hand Dominance   Dominant Hand: Left    Extremity/Trunk Assessment   Upper Extremity Assessment Upper Extremity Assessment: Generalized weakness  Lower Extremity Assessment Lower Extremity Assessment: Generalized weakness    Cervical / Trunk Assessment Cervical / Trunk Assessment: (s/p spinal surgery)  Communication   Communication: HOH(hearing aides)  Cognition Arousal/Alertness: Lethargic Behavior  During Therapy: Flat affect Overall Cognitive Status: Impaired/Different from baseline Area of Impairment: Orientation;Attention;Memory;Following commands;Safety/judgement;Problem solving;Awareness                 Orientation Level: Disoriented to;Place;Time;Situation Current Attention Level: Focused Memory: Decreased recall of precautions;Decreased short-term memory Following Commands: Follows one step commands inconsistently Safety/Judgement: Decreased awareness of safety;Decreased awareness of deficits Awareness: Intellectual Problem Solving: Slow processing;Decreased initiation        General Comments General comments (skin integrity, edema, etc.): pt on 4LNC upon arrival, VSS during session. Pt is very confused.    Exercises     Assessment/Plan    PT Assessment Patient needs continued PT services  PT Problem List         PT Treatment Interventions DME instruction;Gait training;Stair training;Functional mobility training;Therapeutic activities;Therapeutic exercise;Balance training;Neuromuscular re-education;Patient/family education;Wheelchair mobility training;Cognitive remediation    PT Goals (Current goals can be found in the Care Plan section)  Acute Rehab PT Goals Patient Stated Goal: to improve mobility PT Goal Formulation: With patient Time For Goal Achievement: 07/16/19 Potential to Achieve Goals: Good    Frequency Min 5X/week   Barriers to discharge        Co-evaluation               AM-PAC PT "6 Clicks" Mobility  Outcome Measure Help needed turning from your back to your side while in a flat bed without using bedrails?: A Lot Help needed moving from lying on your back to sitting on the side of a flat bed without using bedrails?: A Lot Help needed moving to and from a bed to a chair (including a wheelchair)?: A Lot Help needed standing up from a chair using your arms (e.g., wheelchair or bedside chair)?: A Lot Help needed to walk in hospital  room?: Total Help needed climbing 3-5 steps with a railing? : Total 6 Click Score: 10    End of Session Equipment Utilized During Treatment: Oxygen Activity Tolerance: Patient tolerated treatment well Patient left: in chair;with call bell/phone within reach;with chair alarm set Nurse Communication: Mobility status PT Visit Diagnosis: Unsteadiness on feet (R26.81);Muscle weakness (generalized) (M62.81);Other symptoms and signs involving the nervous system (R29.898);Pain Pain - part of body: (back)    Time: 7902-4097 PT Time Calculation (min) (ACUTE ONLY): 16 min   Charges:   PT Evaluation $PT Eval Moderate Complexity: 1 Mod          Zenaida Niece, PT, DPT Acute Rehabilitation Pager: [phone]   Zenaida Niece 07/02/2019, 5:07 PM

## 2019-07-02 NOTE — Progress Notes (Signed)
[facility]  Date of Admission:  06/30/2019      Total days of antibiotics 3        Day 2 - Cefazolin  ASSESSMENT: Neil Cordova is a 79 y/o male with post-surgical site infection with secondary MSSA bacteremia s/p washout on 07/01/2019. OP note indicates deep surgical site infection without evidence of tracking into the epidural space.   This AM, patient remains quite groggy concerning for encephalitis from infection. No focal deficits at this time, but low threshold to obtain head imaging. Per history, patient was possibly bacteremic for 5-6 days before receiving treatment, making him high risk for septic emboli. On this same note, high risk for endocarditis especially with murmur auscultated on examination. TTE has been ordered but will likely need TEE as well.   PLAN: 1. Continue Cefazolin 2g q8h 2. Repeat blood cultures pending 3. TTE ordered   Active Problems:   Wound infection after surgery   Hyponatremia   Pressure injury of skin   . ALPRAZolam  1 mg Oral QHS  . docusate sodium  100 mg Oral BID  . PARoxetine  30 mg Oral QPM  . sodium chloride flush  3 mL Intravenous Q12H    SUBJECTIVE: Neil Cordova reports he was having some right hip pain overnight, in addition to fevers and chills that were mild. He denies any other acute complaints.   Wife is at beside and reports mentation has not improved back to baseline.   Tray in room full, patient did not eat this AM.   Review of Systems: Negative except as noted above.   No Known Allergies  OBJECTIVE: Vitals:   07/01/19 2001 07/02/19 0325 07/02/19 0326 07/02/19 0837  BP: 140/63  (!) 113/59 (!) 141/81  Pulse: 75  72 70  Resp: 19  (!) 24 20  Temp: 99.1 F (37.3 C) 99 F (37.2 C) 99 F (37.2 C) 99.8 F (37.7 C)  TempSrc:  Axillary Axillary Axillary  SpO2: 96%  99% 91%  Weight:      Height:       Body mass index is 21.1 kg/m.  Physical Exam Vitals and nursing note  reviewed.  Constitutional:      General: He is not in acute distress.    Appearance: He is normal weight. He is ill-appearing.     Interventions: Nasal cannula in place.  Cardiovascular:     Rate and Rhythm: Normal rate and regular rhythm.     Heart sounds: Murmur present. Systolic murmur present with a grade of 1/6. Decrescendo  diastolic murmur present with a grade of 2/4.  Pulmonary:     Effort: Pulmonary effort is normal. No respiratory distress.     Breath sounds: No wheezing.  Musculoskeletal:     Right lower leg: No edema.     Left lower leg: No edema.  Neurological:     General: No focal deficit present.     Mental Status: He is easily aroused. He is lethargic.     Comments: Able to follow commands.   Psychiatric:        Behavior: Behavior is cooperative.    Lab Results Lab Results  Component Value Date   WBC 7.4 06/30/2019   HGB 10.1 (L) 06/30/2019   HCT 30.4 (L) 06/30/2019   MCV 90.2 06/30/2019   PLT 166 06/30/2019    Lab Results  Component Value Date   CREATININE 1.44 (H) 06/30/2019   BUN  41 (H) 06/30/2019   NA 127 (L) 06/30/2019   K 4.6 06/30/2019   CL 93 (L) 06/30/2019   CO2 22 06/30/2019    Lab Results  Component Value Date   ALT 23 06/30/2019   AST 29 06/30/2019   ALKPHOS 76 06/30/2019   BILITOT 1.1 06/30/2019     Microbiology: Recent Results (from the past 240 hour(s))  Blood Culture (routine x 2)     Status: Abnormal (Preliminary result)   Collection Time: 06/30/19 10:55 AM   Specimen: BLOOD RIGHT FOREARM  Result Value Ref Range Status   Specimen Description BLOOD RIGHT FOREARM  Final   Special Requests   Final    BOTTLES DRAWN AEROBIC AND ANAEROBIC Blood Culture adequate volume   Culture  Setup Time   Final    GRAM POSITIVE COCCI IN CLUSTERS IN BOTH AEROBIC AND ANAEROBIC BOTTLES CRITICAL RESULT CALLED TO, READ BACK BY AND VERIFIED WITH: K. AMEND,PHARMD 0210 07/01/2019 T. TYSOR Performed at [facility], [address],  [place], [place] [address]    Culture STAPHYLOCOCCUS AUREUS (A)  Final   Report Status PENDING  Incomplete  Urine culture     Status: Abnormal   Collection Time: 06/30/19 10:55 AM   Specimen: In/Out Cath Urine  Result Value Ref Range Status   Specimen Description IN/OUT CATH URINE  Final   Special Requests   Final    NONE Performed at [facility], [address], [place], [place] [address]    Culture 60,000 COLONIES/mL STAPHYLOCOCCUS EPIDERMIDIS (A)  Final   Report Status 07/02/2019 FINAL  Final   Organism ID, Bacteria STAPHYLOCOCCUS EPIDERMIDIS (A)  Final      Susceptibility   Staphylococcus epidermidis - MIC*    CIPROFLOXACIN <=0.5 SENSITIVE Sensitive     GENTAMICIN <=0.5 SENSITIVE Sensitive     NITROFURANTOIN <=16 SENSITIVE Sensitive     OXACILLIN >=4 RESISTANT Resistant     TETRACYCLINE 2 SENSITIVE Sensitive     VANCOMYCIN 2 SENSITIVE Sensitive     TRIMETH/SULFA <=10 SENSITIVE Sensitive     CLINDAMYCIN <=0.25 SENSITIVE Sensitive     RIFAMPIN <=0.5 SENSITIVE Sensitive     Inducible Clindamycin NEGATIVE Sensitive     * 60,000 COLONIES/mL STAPHYLOCOCCUS EPIDERMIDIS  Blood Culture ID Panel (Reflexed)     Status: Abnormal   Collection Time: 06/30/19 10:55 AM  Result Value Ref Range Status   Enterococcus species NOT DETECTED NOT DETECTED Final   Listeria monocytogenes NOT DETECTED NOT DETECTED Final   Staphylococcus species DETECTED (A) NOT DETECTED Final    Comment: CRITICAL RESULT CALLED TO, READ BACK BY AND VERIFIED WITH: K. AMEND,PHARMD 0210 07/01/2019 T. TYSOR    Staphylococcus aureus (BCID) DETECTED (A) NOT DETECTED Final    Comment: Methicillin (oxacillin) susceptible Staphylococcus aureus (MSSA). Preferred therapy is anti staphylococcal beta lactam antibiotic (Cefazolin or Nafcillin), unless clinically contraindicated. CRITICAL RESULT CALLED TO, READ BACK BY AND VERIFIED WITH: K. AMEND,PHARMD 0210 07/01/2019 T. TYSOR    Methicillin resistance NOT DETECTED NOT  DETECTED Final   Streptococcus species NOT DETECTED NOT DETECTED Final   Streptococcus agalactiae NOT DETECTED NOT DETECTED Final   Streptococcus pneumoniae NOT DETECTED NOT DETECTED Final   Streptococcus pyogenes NOT DETECTED NOT DETECTED Final   Acinetobacter baumannii NOT DETECTED NOT DETECTED Final   Enterobacteriaceae species NOT DETECTED NOT DETECTED Final   Enterobacter cloacae complex NOT DETECTED NOT DETECTED Final   Escherichia coli NOT DETECTED NOT DETECTED Final   Klebsiella oxytoca NOT DETECTED NOT  DETECTED Final   Klebsiella pneumoniae NOT DETECTED NOT DETECTED Final   Proteus species NOT DETECTED NOT DETECTED Final   Serratia marcescens NOT DETECTED NOT DETECTED Final   Haemophilus influenzae NOT DETECTED NOT DETECTED Final   Neisseria meningitidis NOT DETECTED NOT DETECTED Final   Pseudomonas aeruginosa NOT DETECTED NOT DETECTED Final   Candida albicans NOT DETECTED NOT DETECTED Final   Candida glabrata NOT DETECTED NOT DETECTED Final   Candida krusei NOT DETECTED NOT DETECTED Final   Candida parapsilosis NOT DETECTED NOT DETECTED Final   Candida tropicalis NOT DETECTED NOT DETECTED Final    Comment: Performed at [facility], [address], [place], [place] [address]  SARS Coronavirus 2 by RT PCR (hospital order, performed in [facility] hospital lab) Nasopharyngeal Nasopharyngeal Swab     Status: None   Collection Time: 06/30/19 11:49 AM   Specimen: Nasopharyngeal Swab  Result Value Ref Range Status   SARS Coronavirus 2 NEGATIVE NEGATIVE Final    Comment: (NOTE) SARS-CoV-2 target nucleic acids are NOT DETECTED. The SARS-CoV-2 RNA is generally detectable in upper and lower respiratory specimens during the acute phase of infection. The lowest concentration of SARS-CoV-2 viral copies this assay can detect is 250 copies / mL. A negative result does not preclude SARS-CoV-2 infection and should not be used as the sole basis for treatment or other patient  management decisions.  A negative result may occur with improper specimen collection / handling, submission of specimen other than nasopharyngeal swab, presence of viral mutation(s) within the areas targeted by this assay, and inadequate number of viral copies (<250 copies / mL). A negative result must be combined with clinical observations, patient history, and epidemiological information. Fact Sheet for Patients:   BoilerBrush.com.cy Fact Sheet for Healthcare Providers: https://pope.com/ This test is not yet approved or cleared  by the [place] FDA and has been authorized for detection and/or diagnosis of SARS-CoV-2 by FDA under an Emergency Use Authorization (EUA).  This EUA will remain in effect (meaning this test can be used) for the duration of the COVID-19 declaration under Section 564(b)(1) of the Act, 21 U.S.C. section 360bbb-3(b)(1), unless the authorization is terminated or revoked sooner. Performed at [facility], [address] [place], [place] [address]   Blood Culture (routine x 2)     Status: Abnormal (Preliminary result)   Collection Time: 06/30/19 12:00 PM   Specimen: BLOOD RIGHT WRIST  Result Value Ref Range Status   Specimen Description BLOOD RIGHT WRIST  Final   Special Requests   Final    BOTTLES DRAWN AEROBIC AND ANAEROBIC Blood Culture results may not be optimal due to an inadequate volume of blood received in culture bottles   Culture  Setup Time   Final    GRAM POSITIVE COCCI IN CLUSTERS IN BOTH AEROBIC AND ANAEROBIC BOTTLES CRITICAL RESULT CALLED TO, READ BACK BY AND VERIFIED WITH: K. AMEND,PHARMD 0210 07/01/2019 T. TYSOR Performed at [facility], [address], [place], [place] [address]    Culture STAPHYLOCOCCUS AUREUS (A)  Final   Report Status PENDING  Incomplete  Aerobic/Anaerobic Culture (surgical/deep wound)     Status: None (Preliminary result)   Collection Time: 06/30/19  3:42  PM   Specimen: Wound  Result Value Ref Range Status   Specimen Description WOUND SITE NOT SPECIFIED  Final   Special Requests NO ANA SWAB  Final   Gram Stain   Final    NO WBC SEEN MODERATE GRAM POSITIVE  COCCI IN PAIRS IN CLUSTERS Performed at [facility], [place] [address] [place], [place] [address]    Culture ABUNDANT STAPHYLOCOCCUS AUREUS  Final   Report Status PENDING  Incomplete  MRSA PCR Screening     Status: None   Collection Time: 06/30/19 10:27 PM   Specimen: Nasopharyngeal  Result Value Ref Range Status   MRSA by PCR NEGATIVE NEGATIVE Final    Comment:        The GeneXpert MRSA Assay (FDA approved for NASAL specimens only), is one component of a comprehensive MRSA colonization surveillance program. It is not intended to diagnose MRSA infection nor to guide or monitor treatment for MRSA infections. Performed at [facility], [place] [address], [place], [place] [address]   Aerobic/Anaerobic Culture (surgical/deep wound)     Status: None (Preliminary result)   Collection Time: 07/01/19 11:54 AM   Specimen: Wound  Result Value Ref Range Status   Specimen Description WOUND  Final   Special Requests NONE  Final   Gram Stain   Final    FEW WBC PRESENT, PREDOMINANTLY PMN ABUNDANT GRAM POSITIVE COCCI    Culture   Final    CULTURE REINCUBATED FOR BETTER GROWTH Performed at [facility], [place] [address], [place], [place] [address]    Report Status PENDING  Incomplete   Dr. Jose Persia Internal Medicine PGY-1   07/02/2019, 11:23 AM

## 2019-07-02 NOTE — Progress Notes (Signed)
  NEUROSURGERY PROGRESS NOTE   No issues overnight.  Complains of appropriate back soreness Wife at bedside  EXAM:  BP (!) 141/81 (BP Location: Right Arm)   Pulse 70   Temp 99.7 F (37.6 C) (Oral)   Resp 20   Ht 5\' 10"  (1.778 m)   Wt 66.7 kg   SpO2 91%   BMI 21.10 kg/m   Awake, alert, oriented  Speech fluent, appropriate  CN grossly intact  MAEW Incision: purulent discharge noted on bandage. No active drainage at present  IMPRESSION/PLAN 79 y.o. male POD#1 wound washout s/p L L4-5 microdiscectomy. Bacteremic on IV Cefazolin q8. - Continue supportive care, IV abx - PT/OT - Appreciate ID assistance

## 2019-07-02 NOTE — Progress Notes (Signed)
Occupational Therapy Evaluation Patient Details Name: Neil Cordova MRN: [id number] DOB: 08-02-1940 Today's Date: 07/02/2019    History of Present Illness 80 yo male L4-5 microdiscectomy on 5/11 noted to have fever, purulent drainage, cills an swelling. On 5/21 taken to ED due to confusion left AMA per notes.On 5/24 confusion HTN fevers arrived via ambulance from home. Blood cx grew Mssa and taken to OR 5/25 for wash out with Dr Kathyrn Sheriff. PMH depression HTN   Clinical Impression   Patient is s/p I&D surgery due to MSSA bacteria resulting in functional limitations due to the deficits listed below (see OT problem list). Pt currently requires max (A) for bed mobility, cognitive deficits and decreased attention. Recommending PT consult for progression of patient in addition to OT.  Patient will benefit from skilled OT acutely to increase independence and safety with ADLS to allow discharge SNF. Pt is unable to transfer or follow 1 step commands at this time. Pt will need to increase activity tolerance to d/c home. Wife present during session.      Follow Up Recommendations  SNF(pending progress)    Equipment Recommendations  None recommended by OT    Recommendations for Other Services       Precautions / Restrictions Precautions Precautions: Back Restrictions Weight Bearing Restrictions: No      Mobility Bed Mobility Overal bed mobility: Needs Assistance Bed Mobility: Supine to Sit;Rolling Rolling: Max assist   Supine to sit: Max assist     General bed mobility comments: pt grimacing and requires hand over hand placement of hands to progress  Transfers Overall transfer level: Needs assistance   Transfers: Sit to/from Stand Sit to Stand: Mod assist         General transfer comment: pt able to power up from bed surface and sustain standing.     Balance Overall balance assessment: Needs assistance   Sitting balance-Leahy Scale: Poor       Standing balance-Leahy  Scale: Poor                             ADL either performed or assessed with clinical judgement   ADL Overall ADL's : Needs assistance/impaired     Grooming: Maximal assistance   Upper Body Bathing: Maximal assistance   Lower Body Bathing: Total assistance   Upper Body Dressing : Maximal assistance   Lower Body Dressing: Total assistance   Toilet Transfer: Moderate assistance(stedy)             General ADL Comments: pt with sara stedy used for sit<>Stand. pt requires (A) to sustain static standing. pt closing eyes and needs cues for arousal     Vision Baseline Vision/History: Wears glasses Wears Glasses: At all times       Perception     Praxis      Pertinent Vitals/Pain Pain Assessment: No/denies pain     Hand Dominance Left   Extremity/Trunk Assessment Upper Extremity Assessment Upper Extremity Assessment: Generalized weakness   Lower Extremity Assessment Lower Extremity Assessment: Generalized weakness   Cervical / Trunk Assessment Cervical / Trunk Assessment: Other exceptions(s/p surg)   Communication Communication Communication: HOH(hearing aides)   Cognition Arousal/Alertness: Lethargic Behavior During Therapy: Flat affect Overall Cognitive Status: Impaired/Different from baseline Area of Impairment: Orientation;Attention;Memory;Following commands;Safety/judgement;Awareness;Problem solving                 Orientation Level: Disoriented to;Person;Place;Situation;Time Current Attention Level: Focused Memory: Decreased recall of precautions;Decreased short-term memory Following Commands:  Follows one step commands inconsistently Safety/Judgement: Decreased awareness of safety;Decreased awareness of deficits Awareness: Intellectual Problem Solving: Slow processing;Decreased initiation General Comments: pt reports that he is at [employer]. pt reports wife name correctly but requires > 3 minutes with multiple cues to  announce that she is his wife. pt not awareness to back surgery or acute admission   General Comments  2L oxygen used during transfer due to poor reading . pt RA supine in bed with 95% saturation    Exercises     Shoulder Instructions      Home Living Family/patient expects to be discharged to:: Private residence Living Arrangements: Spouse/significant other   Type of Home: House Home Access: Stairs to enter Secretary/administrator of Steps: 3 Entrance Stairs-Rails: None(front door has rail and 5 steps) Home Layout: One level     Bathroom Shower/Tub: Chief Strategy Officer: Standard     Home Equipment: (walking stick)   Additional Comments: son and daughter that are by marriage but relationship is strong and will help . 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, calling , travel        OT Problem List: Decreased strength;Decreased activity tolerance;Impaired balance (sitting and/or standing);Decreased cognition;Decreased safety awareness;Decreased knowledge of use of DME or AE;Decreased knowledge of precautions;Pain      OT Treatment/Interventions: Self-care/ADL training;Therapeutic exercise;Neuromuscular education;Energy conservation;DME and/or AE instruction;Manual therapy;Therapeutic activities;Cognitive remediation/compensation;Patient/family education;Balance training    OT Goals(Current goals can be found in the care plan section) Acute Rehab OT Goals Patient Stated Goal: none stated OT Goal Formulation: With family Time For Goal Achievement: 07/16/19 Potential to Achieve Goals: Good  OT Frequency: Min 3X/week   Barriers to D/C:            Co-evaluation              AM-PAC OT "6 Clicks" Daily Activity     Outcome Measure Help from another person eating meals?: A Lot Help from another person taking care of personal grooming?: A Lot Help  from another person toileting, which includes using toliet, bedpan, or urinal?: Total Help from another person bathing (including washing, rinsing, drying)?: Total Help from another person to put on and taking off regular upper body clothing?: A Lot Help from another person to put on and taking off regular lower body clothing?: Total 6 Click Score: 9   End of Session Equipment Utilized During Treatment: Gait belt Nurse Communication: Mobility status;Precautions  Activity Tolerance: Patient tolerated treatment well Patient left: in chair;with call bell/phone within reach;with chair alarm set;with family/visitor present  OT Visit Diagnosis: Unsteadiness on feet (R26.81);Muscle weakness (generalized) (M62.81);Pain                Time: 1015-1050 OT Time Calculation (min): 35 min Charges:  OT General Charges $OT Visit: 1 Visit OT Evaluation $OT Eval Moderate Complexity: 1 Mod OT Treatments $Self Care/Home Management : 8-22 mins   Brynn, OTR/L  Acute Rehabilitation Services Pager: [phone] Office: [phone] .   Mateo Flow 07/02/2019, 11:59 AM

## 2019-07-03 ENCOUNTER — Inpatient Hospital Stay (HOSPITAL_COMMUNITY): Payer: Medicare Other

## 2019-07-03 DIAGNOSIS — I4892 Unspecified atrial flutter: Secondary | ICD-10-CM

## 2019-07-03 DIAGNOSIS — I4891 Unspecified atrial fibrillation: Secondary | ICD-10-CM | POA: Insufficient documentation

## 2019-07-03 DIAGNOSIS — G9341 Metabolic encephalopathy: Secondary | ICD-10-CM

## 2019-07-03 DIAGNOSIS — L89302 Pressure ulcer of unspecified buttock, stage 2: Secondary | ICD-10-CM

## 2019-07-03 DIAGNOSIS — R410 Disorientation, unspecified: Secondary | ICD-10-CM

## 2019-07-03 DIAGNOSIS — I340 Nonrheumatic mitral (valve) insufficiency: Secondary | ICD-10-CM

## 2019-07-03 LAB — CBC WITH DIFFERENTIAL/PLATELET
Abs Immature Granulocytes: 0.1 10*3/uL — ABNORMAL HIGH (ref 0.00–0.07)
Basophils Absolute: 0 10*3/uL (ref 0.0–0.1)
Basophils Relative: 0 %
Eosinophils Absolute: 0 10*3/uL (ref 0.0–0.5)
Eosinophils Relative: 0 %
HCT: 30 % — ABNORMAL LOW (ref 39.0–52.0)
Hemoglobin: 10.2 g/dL — ABNORMAL LOW (ref 13.0–17.0)
Immature Granulocytes: 1 %
Lymphocytes Relative: 5 %
Lymphs Abs: 0.6 10*3/uL — ABNORMAL LOW (ref 0.7–4.0)
MCH: 30.5 pg (ref 26.0–34.0)
MCHC: 34 g/dL (ref 30.0–36.0)
MCV: 89.8 fL (ref 80.0–100.0)
Monocytes Absolute: 0.5 10*3/uL (ref 0.1–1.0)
Monocytes Relative: 4 %
Neutro Abs: 12.1 10*3/uL — ABNORMAL HIGH (ref 1.7–7.7)
Neutrophils Relative %: 90 %
Platelets: 179 10*3/uL (ref 150–400)
RBC: 3.34 MIL/uL — ABNORMAL LOW (ref 4.22–5.81)
RDW: 14.6 % (ref 11.5–15.5)
WBC: 13.3 10*3/uL — ABNORMAL HIGH (ref 4.0–10.5)
nRBC: 0 % (ref 0.0–0.2)

## 2019-07-03 LAB — BLOOD GAS, ARTERIAL
Acid-base deficit: 0.2 mmol/L (ref 0.0–2.0)
Bicarbonate: 24.4 mmol/L (ref 20.0–28.0)
Drawn by: 70271
FIO2: 28
O2 Saturation: 97.8 %
Patient temperature: 37
pCO2 arterial: 42.9 mmHg (ref 32.0–48.0)
pH, Arterial: 7.373 (ref 7.350–7.450)
pO2, Arterial: 112 mmHg — ABNORMAL HIGH (ref 83.0–108.0)

## 2019-07-03 LAB — COMPREHENSIVE METABOLIC PANEL
ALT: 25 U/L (ref 0–44)
AST: 42 U/L — ABNORMAL HIGH (ref 15–41)
Albumin: 1.9 g/dL — ABNORMAL LOW (ref 3.5–5.0)
Alkaline Phosphatase: 102 U/L (ref 38–126)
Anion gap: 11 (ref 5–15)
BUN: 23 mg/dL (ref 8–23)
CO2: 24 mmol/L (ref 22–32)
Calcium: 8.3 mg/dL — ABNORMAL LOW (ref 8.9–10.3)
Chloride: 98 mmol/L (ref 98–111)
Creatinine, Ser: 0.88 mg/dL (ref 0.61–1.24)
GFR calc Af Amer: >60 mL/min (ref >60)
GFR calc non Af Amer: >60 mL/min (ref >60)
Glucose, Bld: 155 mg/dL — ABNORMAL HIGH (ref 70–99)
Potassium: 4.1 mmol/L (ref 3.5–5.1)
Sodium: 133 mmol/L — ABNORMAL LOW (ref 135–145)
Total Bilirubin: 1.1 mg/dL (ref 0.3–1.2)
Total Protein: 5 g/dL — ABNORMAL LOW (ref 6.5–8.1)

## 2019-07-03 LAB — CULTURE, BLOOD (ROUTINE X 2): Special Requests: ADEQUATE

## 2019-07-03 LAB — ECHOCARDIOGRAM COMPLETE
Height: 70 in
Weight: 2352.75 oz

## 2019-07-03 LAB — AMMONIA: Ammonia: 9 umol/L (ref 9–35)

## 2019-07-03 LAB — MAGNESIUM: Magnesium: 1.8 mg/dL (ref 1.7–2.4)

## 2019-07-03 LAB — C-REACTIVE PROTEIN: CRP: 22.3 mg/dL — ABNORMAL HIGH (ref <1.0)

## 2019-07-03 LAB — GLUCOSE, CAPILLARY: Glucose-Capillary: 154 mg/dL — ABNORMAL HIGH (ref 70–99)

## 2019-07-03 IMAGING — CT CT HEAD W/O CM
4 series · 17 of 47 positions shown, 19 images · non-contrast
Comparison: None.

CLINICAL DATA: Altered mental status.

EXAM:
CT HEAD WITHOUT CONTRAST
TECHNIQUE: Contiguous axial images were obtained from the base of the skull
through the vertex without intravenous contrast.

[Series 3: head wo · axial · 0.43mm/px · z∈[+1058,+1198]mm · 7 of 38 slices shown, 9 images]
[im 5/38  brain]
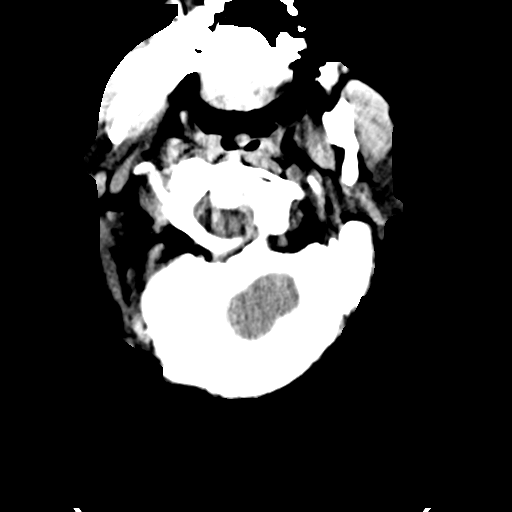
[im 5/38  bone]
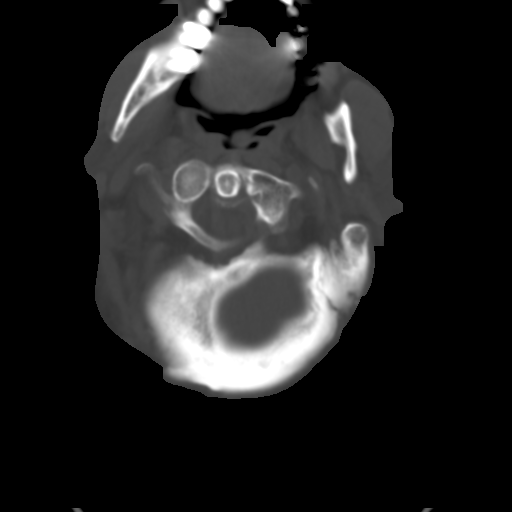
[im 10/38  brain]
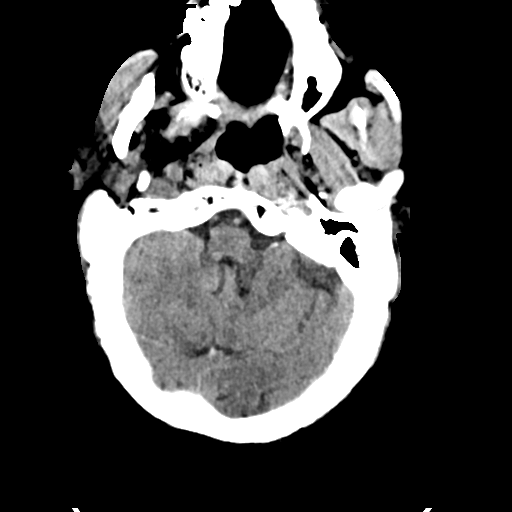
[im 14/38  brain]
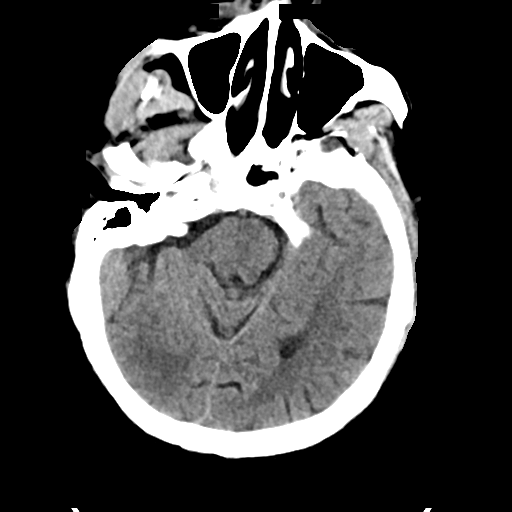
[im 19/38  brain]
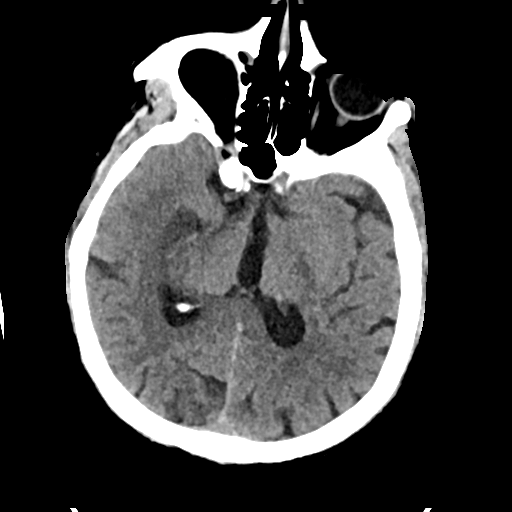
[im 24/38  brain]
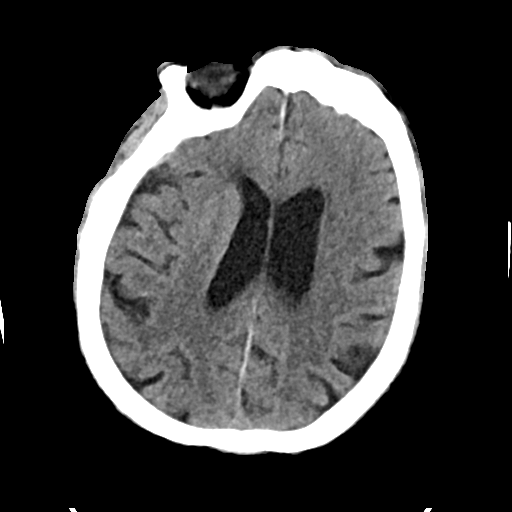
[im 24/38  bone]
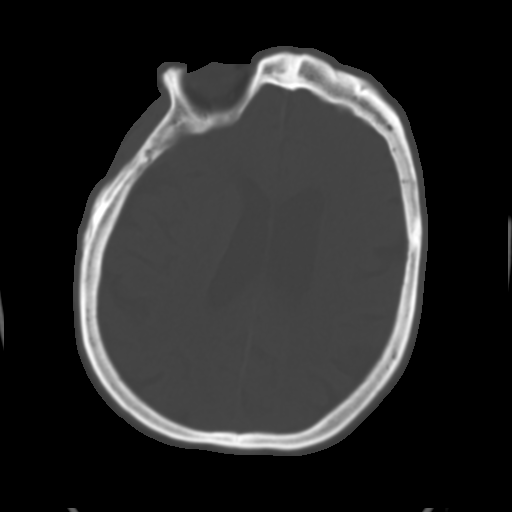
[im 28/38  brain]
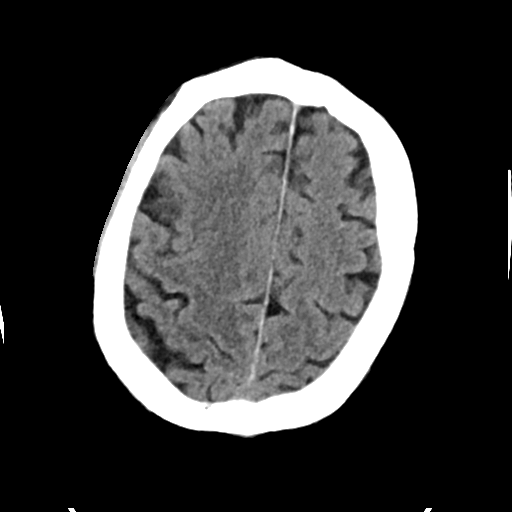
[im 33/38  brain]
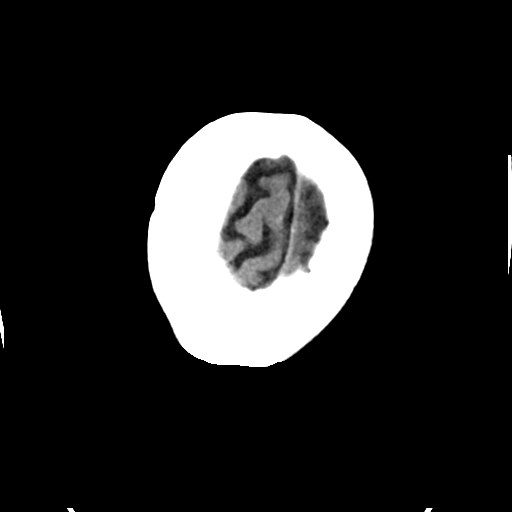

[Series 4: head bone · axial · 0.43mm/px · z∈[+1056,+1122]mm · 4 of 95 slices shown]
[im 10/95  bone]
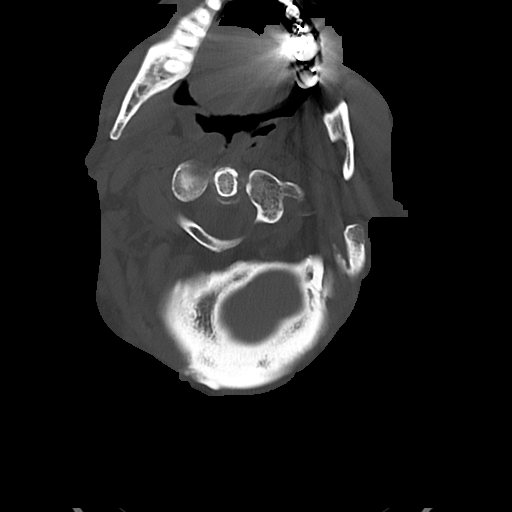
[im 19/95  bone]
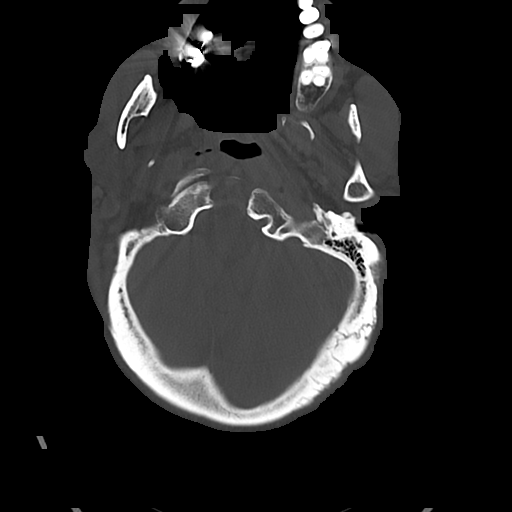
[im 29/95  bone]
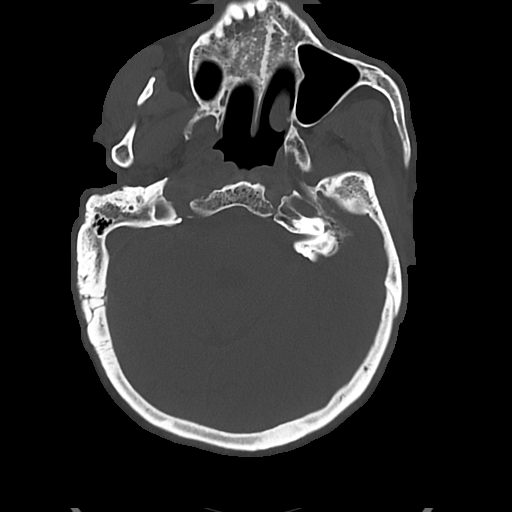
[im 43/95  bone]
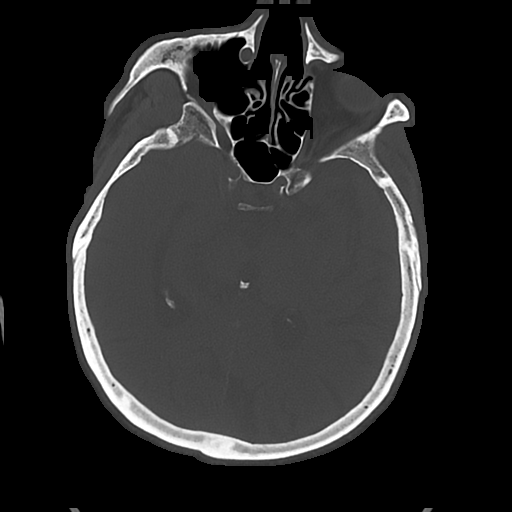

[Series 5: cor soft · coronal · 0.37mm/px · 3 of 79 slices shown]
[im 27/79  brain]
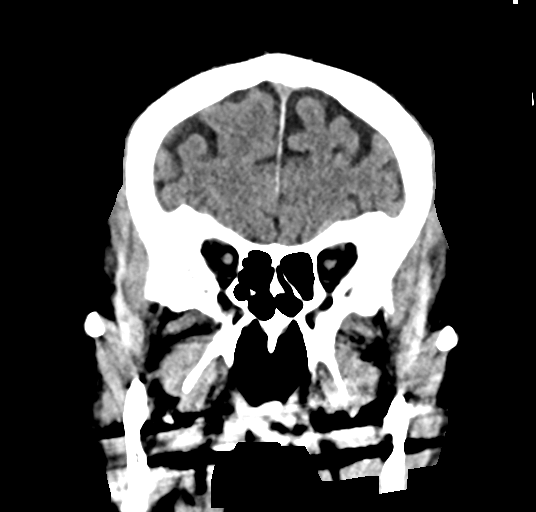
[im 35/79  brain]
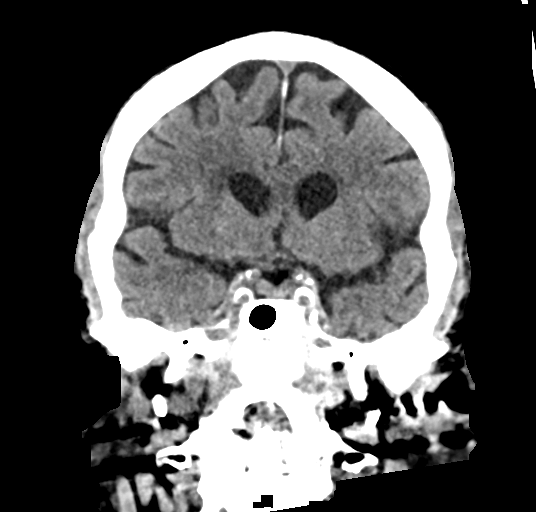
[im 44/79  brain]
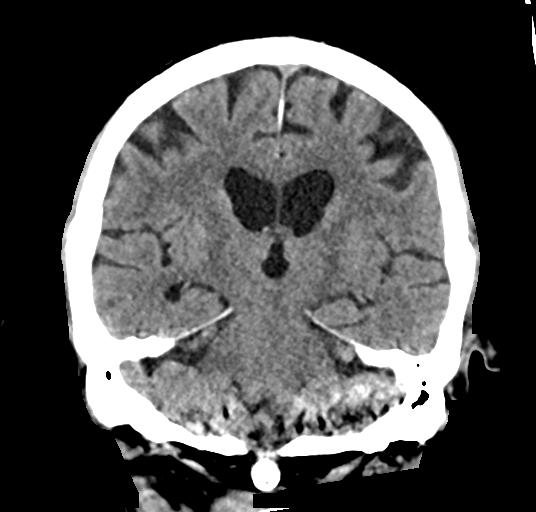

[Series 6: sag soft · sagittal · 0.37mm/px · 3 of 67 slices shown]
[im 25/67  brain]
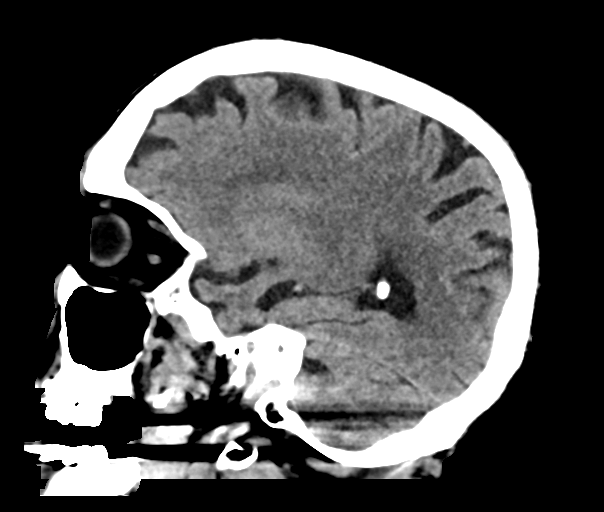
[im 34/67  brain]
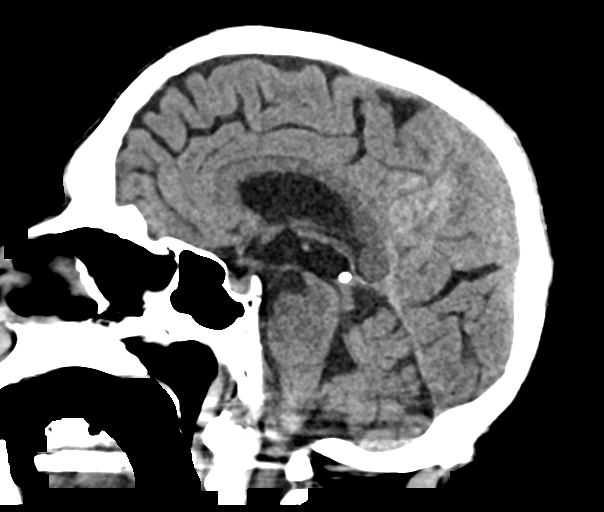
[im 42/67  brain]
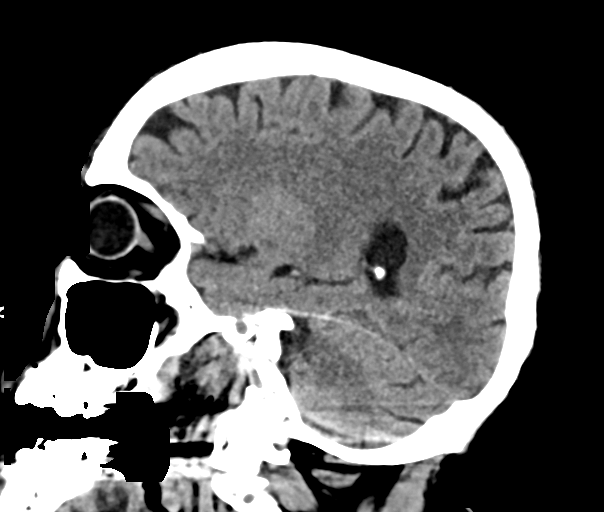

[17 of 47 positions shown; findings below may reference images not displayed]

FINDINGS: Brain: No evidence of acute infarction, hemorrhage, hydrocephalus,
extra-axial collection or mass lesion/mass effect.

Vascular: No hyperdense vessel or unexpected calcification.

Skull: Normal. Negative for fracture or focal lesion.

Sinuses/Orbits: No acute finding.

Other: None.
IMPRESSION: Normal head CT.

## 2019-07-03 MED ORDER — METOPROLOL TARTRATE 25 MG PO TABS
25.0000 mg | ORAL_TABLET | Freq: Two times a day (BID) | ORAL | Status: DC
  Administered 2019-07-04 – 2019-07-05 (×4): 25 mg via ORAL
  Filled 2019-07-03 (×4): qty 1

## 2019-07-03 MED ORDER — PROSIGHT PO TABS
1.0000 | ORAL_TABLET | Freq: Every day | ORAL | Status: DC
  Administered 2019-07-04 – 2019-07-10 (×7): 1 via ORAL
  Filled 2019-07-03 (×7): qty 1

## 2019-07-03 MED ORDER — HALOPERIDOL LACTATE 5 MG/ML IJ SOLN
1.0000 mg | INTRAMUSCULAR | Status: DC | PRN
  Administered 2019-07-07: 1 mg via INTRAMUSCULAR
  Filled 2019-07-03: qty 1

## 2019-07-03 MED ORDER — MAGNESIUM SULFATE IN D5W 1-5 GM/100ML-% IV SOLN
1.0000 g | Freq: Once | INTRAVENOUS | Status: AC
  Administered 2019-07-03: 1 g via INTRAVENOUS
  Filled 2019-07-03: qty 100

## 2019-07-03 MED ORDER — HALOPERIDOL 1 MG PO TABS
1.0000 mg | ORAL_TABLET | ORAL | Status: DC | PRN
  Administered 2019-07-05: 1 mg via ORAL
  Filled 2019-07-03: qty 1

## 2019-07-03 MED ORDER — HEPARIN (PORCINE) 25000 UT/250ML-% IV SOLN
1550.0000 [IU]/h | INTRAVENOUS | Status: AC
  Administered 2019-07-03: 950 [IU]/h via INTRAVENOUS
  Administered 2019-07-04: 1400 [IU]/h via INTRAVENOUS
  Administered 2019-07-05: 1850 [IU]/h via INTRAVENOUS
  Administered 2019-07-05: 1650 [IU]/h via INTRAVENOUS
  Administered 2019-07-06 – 2019-07-09 (×5): 1800 [IU]/h via INTRAVENOUS
  Administered 2019-07-09: 1750 [IU]/h via INTRAVENOUS
  Filled 2019-07-03 (×10): qty 250

## 2019-07-03 MED ORDER — OXYCODONE HCL 5 MG PO TABS
5.0000 mg | ORAL_TABLET | ORAL | Status: DC | PRN
  Administered 2019-07-04 – 2019-07-10 (×14): 5 mg via ORAL
  Filled 2019-07-03 (×14): qty 1

## 2019-07-03 MED ORDER — THIAMINE HCL 100 MG/ML IJ SOLN
100.0000 mg | Freq: Every day | INTRAMUSCULAR | Status: DC
  Administered 2019-07-03 – 2019-07-04 (×2): 100 mg via INTRAVENOUS
  Filled 2019-07-03 (×2): qty 2

## 2019-07-03 MED ORDER — DEXTROSE IN LACTATED RINGERS 5 % IV SOLN: INTRAVENOUS | Status: DC

## 2019-07-03 MED ORDER — SODIUM CHLORIDE 0.9 % IV SOLN
2.0000 g | INTRAVENOUS | Status: DC
  Administered 2019-07-03 – 2019-07-04 (×4): 2 g via INTRAVENOUS
  Filled 2019-07-03 (×8): qty 2000

## 2019-07-03 NOTE — Progress Notes (Signed)
Physical Therapy Treatment Patient Details Name: Renly Roots MRN: [id number] DOB: December 13, 1940 Today's Date: 07/03/2019    History of Present Illness 79 yo male L4-5 microdiscectomy on 5/11 noted to have fever, purulent drainage, cills an swelling. On 5/21 taken to ED due to confusion left AMA per notes.On 5/24 confusion HTN fevers arrived via ambulance from home. Blood cx grew Mssa and taken to OR 5/25 for wash out with Dr Conchita Paris. PMH depression HTN    PT Comments    Pt limited by fatigue, lethargy, and altered cognition. Pt is very confused and impulsive at times during session, requiring frequent re-orientation. Pt often attempting to pull off mits. Pt requires significant physical assistance to perform functional mobility as well as physical assistance to initiate mobility a this time. Pt appears to be in discomfort with frequent grimaces during session. Pt is only able to tolerate brief periods of standing this session for ~1 minute with PT assistance, fatiguing quickly. Pt will continue to benefit from PT POC to improve activity tolerance and reduce caregiver burden. PT continues to recommend SNF placement at this time.  Follow Up Recommendations  SNF     Equipment Recommendations  Wheelchair (measurements PT);Wheelchair cushion (measurements PT);Hospital bed    Recommendations for Other Services       Precautions / Restrictions Precautions Precautions: Back Precaution Booklet Issued: No Precaution Comments: PT provides cues and physical assistance to maintain back precautions during session, no recall noted from patient Restrictions Weight Bearing Restrictions: No    Mobility  Bed Mobility Overal bed mobility: Needs Assistance Bed Mobility: Rolling;Sidelying to Sit;Sit to Sidelying Rolling: Max assist Sidelying to sit: Max assist     Sit to sidelying: Max assist    Transfers Overall transfer level: Needs assistance Equipment used: 1 person hand held  assist Transfers: Sit to/from Stand Sit to Stand: Max assist         General transfer comment: PT defers transfer 2/2 cognition and safety concerns  Ambulation/Gait Ambulation/Gait assistance: Max assist Gait Distance (Feet): 0 Feet Assistive device: None Gait Pattern/deviations: Step-to pattern Gait velocity: 0 Gait velocity interpretation: <1.31 ft/sec, indicative of household ambulator General Gait Details: pt is able to clear both feet for 2 steps in place at edge of bed   Stairs             Wheelchair Mobility    Modified Rankin (Stroke Patients Only)       Balance Overall balance assessment: Needs assistance Sitting-balance support: Single extremity supported;Feet supported Sitting balance-Leahy Scale: Poor Sitting balance - Comments: min-modA at edge of bed   Standing balance support: Bilateral upper extremity supported Standing balance-Leahy Scale: Zero Standing balance comment: mod-maxA to maintain static standing balance. maxA initially progressing to modA with continued standing                            Cognition Arousal/Alertness: Lethargic Behavior During Therapy: Restless;Impulsive Overall Cognitive Status: Impaired/Different from baseline Area of Impairment: Orientation;Attention;Memory;Following commands;Safety/judgement;Awareness;Problem solving                 Orientation Level: Disoriented to;Time;Situation;Place Current Attention Level: Focused Memory: Decreased recall of precautions;Decreased short-term memory Following Commands: Follows one step commands inconsistently Safety/Judgement: Decreased awareness of safety;Decreased awareness of deficits Awareness: Intellectual Problem Solving: Slow processing;Requires verbal cues;Requires tactile cues        Exercises      General Comments General comments (skin integrity, edema, etc.): pt on RA upon arrival  but 2L [place] running from wall beside patient. Pt saturates  90% and above on room air during mobility but does desat to 88% after falling asleep upon return to supine. PT places 2L [place] on patient at this time.      Pertinent Vitals/Pain Pain Assessment: Faces Faces Pain Scale: Hurts whole lot Pain Location: back Pain Descriptors / Indicators: Grimacing Pain Intervention(s): Monitored during session    Home Living                      Prior Function            PT Goals (current goals can now be found in the care plan section) Acute Rehab PT Goals Patient Stated Goal: to improve mobility Progress towards PT goals: Not progressing toward goals - comment(more significant AMS)    Frequency    Min 5X/week      PT Plan Current plan remains appropriate    Co-evaluation              AM-PAC PT "6 Clicks" Mobility   Outcome Measure  Help needed turning from your back to your side while in a flat bed without using bedrails?: Total Help needed moving from lying on your back to sitting on the side of a flat bed without using bedrails?: Total Help needed moving to and from a bed to a chair (including a wheelchair)?: Total Help needed standing up from a chair using your arms (e.g., wheelchair or bedside chair)?: Total Help needed to walk in hospital room?: Total Help needed climbing 3-5 steps with a railing? : Total 6 Click Score: 6    End of Session Equipment Utilized During Treatment: Oxygen Activity Tolerance: Treatment limited secondary to medical complications (Comment);Patient limited by fatigue;Patient limited by lethargy(confusion) Patient left: in bed;with call bell/phone within reach;with bed alarm set;with family/visitor present Nurse Communication: Mobility status PT Visit Diagnosis: Unsteadiness on feet (R26.81);Muscle weakness (generalized) (M62.81);Other symptoms and signs involving the nervous system (R29.898);Pain Pain - part of body: (back)     Time: 9233-0076 PT Time Calculation (min) (ACUTE ONLY): 20  min  Charges:  $Therapeutic Activity: 8-22 mins                     Zenaida Niece, PT, DPT Acute Rehabilitation Pager: [phone]    Zenaida Niece 07/03/2019, 4:30 PM

## 2019-07-03 NOTE — Progress Notes (Signed)
[facility]  Date of Admission:  06/30/2019      Total days of antibiotics 3        Day 2 - Cefazolin  ASSESSMENT: Neil Cordova is a 79 y/o male with post-surgical site infection with secondary MSSA bacteremia s/p washout on 07/01/2019. OP note indicates deep surgical site infection without evidence of tracking into the epidural space.   Mr. Grizzle continues to be altered this AM. Given that he is on Day 3 of Cefazolin with NGTD on repeat blood cultures, encephalopathy is less likely to be related to infectious process. CMP checked and no evidence of hepatic or renal dysfunction. Negative ammonia. No signs of metabolic encephalopathy. No focal deficits either to raise suspicion for CVA, although he is at high risk of septic emboli. Will switch to Nafcillin for CNS coverage although no obvious meningitic signs on examination.   PLAN: 1. Discontinue Cefazolin 2. Start Nafcillin 2g q4h 3. Continue to follow repeat blood cultures  4. Will need PICC placement once negative for minimum 48 hours, preferably 72 hours 5. TTE today 6. TEE tomorrow AM  Active Problems:   Wound infection after surgery   Hyponatremia   Pressure injury of skin   . ALPRAZolam  1 mg Oral QHS  . docusate sodium  100 mg Oral BID  . PARoxetine  30 mg Oral QPM  . sodium chloride flush  3 mL Intravenous Q12H    SUBJECTIVE: Neil Cordova continues to be quite altered this AM. Wife at bedside states he is still quite far from baseline. She is aware that PT/OT recommending SNF however she would much prefer CIR.   Review of Systems: Negative except as noted above.   No Known Allergies  OBJECTIVE: Vitals:   07/03/19 0438 07/03/19 0729 07/03/19 0900 07/03/19 1222  BP:  (!) 152/64  (!) 150/98  Pulse:  74  77  Resp:  (!) 22  (!) 26  Temp: 98.4 F (36.9 C) 98.1 F (36.7 C)  (!) 101.2 F (38.4 C)  TempSrc: Oral Oral  Axillary  SpO2:  95% 90% 93%  Weight:      Height:       Body mass  index is 21.1 kg/m.  Physical Exam Vitals and nursing note reviewed.  Constitutional:      Appearance: He is ill-appearing.     Comments: Difficult to arouse   HENT:     Head: Normocephalic and atraumatic.  Cardiovascular:     Rate and Rhythm: Normal rate. Rhythm irregular.     Heart sounds: Murmur (systolic murmur heard best at LLSB) present.  Pulmonary:     Effort: Pulmonary effort is normal. No respiratory distress.  Abdominal:     General: There is no distension.     Palpations: Abdomen is soft.     Tenderness: There is no abdominal tenderness.  Musculoskeletal:     Right lower leg: No edema.     Left lower leg: No edema.  Skin:    General: Skin is warm and dry.  Neurological:     Mental Status: He is lethargic, disoriented and confused.     Comments: Moving all extremities but not following commands. Continues to be quite altered today with nonsensical speech and orientation only to self.     Lab Results Lab Results  Component Value Date   WBC 13.3 (H) 07/03/2019   HGB 10.2 (L) 07/03/2019   HCT 30.0 (L) 07/03/2019   MCV 89.8 07/03/2019   PLT 179  07/03/2019    Lab Results  Component Value Date   CREATININE 0.88 07/03/2019   BUN 23 07/03/2019   NA 133 (L) 07/03/2019   K 4.1 07/03/2019   CL 98 07/03/2019   CO2 24 07/03/2019    Lab Results  Component Value Date   ALT 25 07/03/2019   AST 42 (H) 07/03/2019   ALKPHOS 102 07/03/2019   BILITOT 1.1 07/03/2019     Microbiology: Recent Results (from the past 240 hour(s))  Blood Culture (routine x 2)     Status: Abnormal   Collection Time: 06/30/19 10:55 AM   Specimen: BLOOD RIGHT FOREARM  Result Value Ref Range Status   Specimen Description BLOOD RIGHT FOREARM  Final   Special Requests   Final    BOTTLES DRAWN AEROBIC AND ANAEROBIC Blood Culture adequate volume   Culture  Setup Time   Final    GRAM POSITIVE COCCI IN CLUSTERS IN BOTH AEROBIC AND ANAEROBIC BOTTLES CRITICAL RESULT CALLED TO, READ BACK BY AND  VERIFIED WITH: K. AMEND,PHARMD 0210 07/01/2019 T. TYSOR Performed at [facility], [address], [place], [place] [address]    Culture STAPHYLOCOCCUS AUREUS (A)  Final   Report Status 07/03/2019 FINAL  Final   Organism ID, Bacteria STAPHYLOCOCCUS AUREUS  Final      Susceptibility   Staphylococcus aureus - MIC*    CIPROFLOXACIN <=0.5 SENSITIVE Sensitive     ERYTHROMYCIN RESISTANT Resistant     GENTAMICIN <=0.5 SENSITIVE Sensitive     OXACILLIN 0.5 SENSITIVE Sensitive     TETRACYCLINE <=1 SENSITIVE Sensitive     VANCOMYCIN 1 SENSITIVE Sensitive     TRIMETH/SULFA <=10 SENSITIVE Sensitive     CLINDAMYCIN RESISTANT Resistant     RIFAMPIN <=0.5 SENSITIVE Sensitive     Inducible Clindamycin POSITIVE Resistant     * STAPHYLOCOCCUS AUREUS  Urine culture     Status: Abnormal   Collection Time: 06/30/19 10:55 AM   Specimen: In/Out Cath Urine  Result Value Ref Range Status   Specimen Description IN/OUT CATH URINE  Final   Special Requests   Final    NONE Performed at [facility], [address], [place], [place] [address]    Culture 60,000 COLONIES/mL STAPHYLOCOCCUS EPIDERMIDIS (A)  Final   Report Status 07/02/2019 FINAL  Final   Organism ID, Bacteria STAPHYLOCOCCUS EPIDERMIDIS (A)  Final      Susceptibility   Staphylococcus epidermidis - MIC*    CIPROFLOXACIN <=0.5 SENSITIVE Sensitive     GENTAMICIN <=0.5 SENSITIVE Sensitive     NITROFURANTOIN <=16 SENSITIVE Sensitive     OXACILLIN >=4 RESISTANT Resistant     TETRACYCLINE 2 SENSITIVE Sensitive     VANCOMYCIN 2 SENSITIVE Sensitive     TRIMETH/SULFA <=10 SENSITIVE Sensitive     CLINDAMYCIN <=0.25 SENSITIVE Sensitive     RIFAMPIN <=0.5 SENSITIVE Sensitive     Inducible Clindamycin NEGATIVE Sensitive     * 60,000 COLONIES/mL STAPHYLOCOCCUS EPIDERMIDIS  Blood Culture ID Panel (Reflexed)     Status: Abnormal   Collection Time: 06/30/19 10:55 AM  Result Value Ref Range Status   Enterococcus species NOT DETECTED NOT  DETECTED Final   Listeria monocytogenes NOT DETECTED NOT DETECTED Final   Staphylococcus species DETECTED (A) NOT DETECTED Final    Comment: CRITICAL RESULT CALLED TO, READ BACK BY AND VERIFIED WITH: K. AMEND,PHARMD 0210 07/01/2019 T. TYSOR    Staphylococcus aureus (BCID) DETECTED (A) NOT DETECTED Final    Comment: Methicillin (oxacillin) susceptible Staphylococcus aureus (  MSSA). Preferred therapy is anti staphylococcal beta lactam antibiotic (Cefazolin or Nafcillin), unless clinically contraindicated. CRITICAL RESULT CALLED TO, READ BACK BY AND VERIFIED WITH: K. AMEND,PHARMD 0210 07/01/2019 T. TYSOR    Methicillin resistance NOT DETECTED NOT DETECTED Final   Streptococcus species NOT DETECTED NOT DETECTED Final   Streptococcus agalactiae NOT DETECTED NOT DETECTED Final   Streptococcus pneumoniae NOT DETECTED NOT DETECTED Final   Streptococcus pyogenes NOT DETECTED NOT DETECTED Final   Acinetobacter baumannii NOT DETECTED NOT DETECTED Final   Enterobacteriaceae species NOT DETECTED NOT DETECTED Final   Enterobacter cloacae complex NOT DETECTED NOT DETECTED Final   Escherichia coli NOT DETECTED NOT DETECTED Final   Klebsiella oxytoca NOT DETECTED NOT DETECTED Final   Klebsiella pneumoniae NOT DETECTED NOT DETECTED Final   Proteus species NOT DETECTED NOT DETECTED Final   Serratia marcescens NOT DETECTED NOT DETECTED Final   Haemophilus influenzae NOT DETECTED NOT DETECTED Final   Neisseria meningitidis NOT DETECTED NOT DETECTED Final   Pseudomonas aeruginosa NOT DETECTED NOT DETECTED Final   Candida albicans NOT DETECTED NOT DETECTED Final   Candida glabrata NOT DETECTED NOT DETECTED Final   Candida krusei NOT DETECTED NOT DETECTED Final   Candida parapsilosis NOT DETECTED NOT DETECTED Final   Candida tropicalis NOT DETECTED NOT DETECTED Final    Comment: Performed at [facility], [address] [place], [place] [address]  SARS Coronavirus 2 by RT PCR (hospital order,  performed in [facility] hospital lab) Nasopharyngeal Nasopharyngeal Swab     Status: None   Collection Time: 06/30/19 11:49 AM   Specimen: Nasopharyngeal Swab  Result Value Ref Range Status   SARS Coronavirus 2 NEGATIVE NEGATIVE Final    Comment: (NOTE) SARS-CoV-2 target nucleic acids are NOT DETECTED. The SARS-CoV-2 RNA is generally detectable in upper and lower respiratory specimens during the acute phase of infection. The lowest concentration of SARS-CoV-2 viral copies this assay can detect is 250 copies / mL. A negative result does not preclude SARS-CoV-2 infection and should not be used as the sole basis for treatment or other patient management decisions.  A negative result may occur with improper specimen collection / handling, submission of specimen other than nasopharyngeal swab, presence of viral mutation(s) within the areas targeted by this assay, and inadequate number of viral copies (<250 copies / mL). A negative result must be combined with clinical observations, patient history, and epidemiological information. Fact Sheet for Patients:   BoilerBrush.com.cyhttps://www.fda.gov/media/136312/download Fact Sheet for Healthcare Providers: https://pope.com/https://www.fda.gov/media/136313/download This test is not yet approved or cleared  by the [place] FDA and has been authorized for detection and/or diagnosis of SARS-CoV-2 by FDA under an Emergency Use Authorization (EUA).  This EUA will remain in effect (meaning this test can be used) for the duration of the COVID-19 declaration under Section 564(b)(1) of the Act, 21 U.S.C. section 360bbb-3(b)(1), unless the authorization is terminated or revoked sooner. Performed at [facility], [address] [place], [place] [address]   Blood Culture (routine x 2)     Status: Abnormal   Collection Time: 06/30/19 12:00 PM   Specimen: BLOOD RIGHT WRIST  Result Value Ref Range Status   Specimen Description BLOOD RIGHT WRIST  Final   Special Requests    Final    BOTTLES DRAWN AEROBIC AND ANAEROBIC Blood Culture results may not be optimal due to an inadequate volume of blood received in culture bottles   Culture  Setup Time   Final    GRAM POSITIVE COCCI  IN CLUSTERS IN BOTH AEROBIC AND ANAEROBIC BOTTLES CRITICAL RESULT CALLED TO, READ BACK BY AND VERIFIED WITH: K. AMEND,PHARMD 0210 07/01/2019 T. TYSOR    Culture (A)  Final    STAPHYLOCOCCUS AUREUS SUSCEPTIBILITIES PERFORMED ON PREVIOUS CULTURE WITHIN THE LAST 5 DAYS. Performed at [facility], [address], [place], [place] [address]    Report Status 07/03/2019 FINAL  Final  Aerobic/Anaerobic Culture (surgical/deep wound)     Status: None (Preliminary result)   Collection Time: 06/30/19  3:42 PM   Specimen: Wound  Result Value Ref Range Status   Specimen Description WOUND SITE NOT SPECIFIED  Final   Special Requests NO ANA SWAB  Final   Gram Stain   Final    NO WBC SEEN MODERATE GRAM POSITIVE COCCI IN PAIRS IN CLUSTERS    Culture ABUNDANT STAPHYLOCOCCUS AUREUS  Final   Report Status PENDING  Incomplete   Organism ID, Bacteria STAPHYLOCOCCUS AUREUS  Final      Susceptibility   Staphylococcus aureus - MIC*    CIPROFLOXACIN <=0.5 SENSITIVE Sensitive     ERYTHROMYCIN RESISTANT Resistant     GENTAMICIN <=0.5 SENSITIVE Sensitive     OXACILLIN <=0.25 SENSITIVE Sensitive     TETRACYCLINE <=1 SENSITIVE Sensitive     VANCOMYCIN <=0.5 SENSITIVE Sensitive     TRIMETH/SULFA <=10 SENSITIVE Sensitive     CLINDAMYCIN RESISTANT Resistant     RIFAMPIN <=0.5 SENSITIVE Sensitive     Inducible Clindamycin Value in next row Resistant      POSITIVEPerformed at [facility], [address], [place], [place] [address]    * ABUNDANT STAPHYLOCOCCUS AUREUS  MRSA PCR Screening     Status: None   Collection Time: 06/30/19 10:27 PM   Specimen: Nasopharyngeal  Result Value Ref Range Status   MRSA by PCR NEGATIVE NEGATIVE Final    Comment:        The GeneXpert MRSA Assay (FDA approved for  NASAL specimens only), is one component of a comprehensive MRSA colonization surveillance program. It is not intended to diagnose MRSA infection nor to guide or monitor treatment for MRSA infections. Performed at [facility], [address] [place], [place] [address]   Aerobic/Anaerobic Culture (surgical/deep wound)     Status: None (Preliminary result)   Collection Time: 07/01/19 11:54 AM   Specimen: Wound  Result Value Ref Range Status   Specimen Description WOUND  Final   Special Requests NONE  Final   Gram Stain   Final    FEW WBC PRESENT, PREDOMINANTLY PMN ABUNDANT GRAM POSITIVE COCCI Performed at [facility], [address] [place], [place] [address]    Culture   Final    ABUNDANT STAPHYLOCOCCUS AUREUS NO ANAEROBES ISOLATED; CULTURE IN PROGRESS FOR 5 DAYS    Report Status PENDING  Incomplete   Organism ID, Bacteria STAPHYLOCOCCUS AUREUS  Final      Susceptibility   Staphylococcus aureus - MIC*    CIPROFLOXACIN <=0.5 SENSITIVE Sensitive     ERYTHROMYCIN RESISTANT Resistant     GENTAMICIN <=0.5 SENSITIVE Sensitive     OXACILLIN 0.5 SENSITIVE Sensitive     TETRACYCLINE <=1 SENSITIVE Sensitive     VANCOMYCIN 1 SENSITIVE Sensitive     TRIMETH/SULFA <=10 SENSITIVE Sensitive     CLINDAMYCIN RESISTANT Resistant     RIFAMPIN <=0.5 SENSITIVE Sensitive     Inducible Clindamycin POSITIVE Resistant     * ABUNDANT STAPHYLOCOCCUS AUREUS  Culture, blood (routine x 2)  Status: None (Preliminary result)   Collection Time: 07/02/19 12:06 PM   Specimen: BLOOD  Result Value Ref Range Status   Specimen Description BLOOD RIGHT ANTECUBITAL  Final   Special Requests   Final    BOTTLES DRAWN AEROBIC AND ANAEROBIC Blood Culture adequate volume   Culture   Final    NO GROWTH < 24 HOURS Performed at [facility], [address] [place], [place] [address]    Report Status PENDING  Incomplete  Culture, blood (routine x 2)     Status: None (Preliminary result)    Collection Time: 07/02/19 12:11 PM   Specimen: BLOOD RIGHT HAND  Result Value Ref Range Status   Specimen Description BLOOD RIGHT HAND  Final   Special Requests   Final    BOTTLES DRAWN AEROBIC AND ANAEROBIC Blood Culture adequate volume   Culture   Final    NO GROWTH < 24 HOURS Performed at [facility], [address] [place], [place] [address]    Report Status PENDING  Incomplete   Dr. Jose Persia Internal Medicine PGY-1   07/03/2019, 2:14 PM

## 2019-07-03 NOTE — Progress Notes (Addendum)
    [facility] has been requested to perform a transesophageal echocardiogram on Neil Cordova for bacteremia.  After careful review of history and examination, the risks and benefits of transesophageal echocardiogram have been explained including risks of esophageal damage, perforation (1:10,000 risk), bleeding, pharyngeal hematoma as well as other potential complications associated with conscious sedation including aspiration, arrhythmia, respiratory failure and death. Alternatives to treatment were discussed, questions were answered.   Patient is not able to personally consent at this time due to altered mental status. Called and spoke with wife Neil Cordova - discussed above risk and benefits with her and she agrees to proceed with the procedure. Radford Pax, RN, was with me when I called his wife and witnessed the consent. We both signed consent form and it was placed in paper chart.   Procedure is scheduled for 07/04/2019 at 7:30am with Dr. Elease Hashimoto.   Of note, transthoracic echo has not been completed at this time but will follow-up on this before procedure tomorrow. Also, hemoglobin low but stable at 10.2. Will recheck tomorrow morning.   Corrin Parker, PA-C 07/03/2019 1:55 PM

## 2019-07-03 NOTE — Progress Notes (Signed)
PROGRESS NOTE    Quint Chestnut  YHC:[id number] DOB: 05-08-1940 DOA: 06/30/2019 PCP: Patient, No Pcp Per    Brief Narrative:  Patient was initially consulted on May 24 for assistance in medical management.  At that point patient had a postoperative wound infection, status post left L4-L5 microdissection on 5/11.  He also had metabolic encephalopathy, hypotension, hyponatremia and acute kidney injury.  Recommendations were to continue close neurologic monitoring, continue intravenous antibiotics and IV fluids.  His blood cultures grew MSSA and patient was taken to the OR on 5/25 for washout, finding a deep surgical site infection that did not track into the epidural space.  Infectious disease was consulted, recommendations to change antibiotic therapy to cefazolin and obtain further cardiac images to rule out endocarditis.  His echocardiogram performed today had no evidence of valvular vegetations, and the plan is to obtain a transesophageal echocardiogram in the morning.  Patient continued to be encephalopathic and not back to baseline, TRH was reconsulted for further recommendations.   Assessment & Plan:   Principal Problem:   Wound infection after surgery Active Problems:   Hyponatremia   Pressure injury of skin   Acute metabolic encephalopathy   Delirium   Atrial flutter (HCC)   1. Acute metabolic encephalopathy with delirium. Patient very lethargic and somnolent, wakes up to pain and touch. Very confused and disorientated. Last night agitated and received 1 mg of alprazolam. On Paroxetine 30 mg daily. Has not received oxycodone today. Ammonia is 9.   Patient with worsening encephalopathy likely metabolic, with features of delirium, very poor oral intake.   Will get ABG to ruled out respiratory acidosis. Continue supportive medical therapy with neuro checks and aspiration precautions. Will add IV fluids with dextrose, add thiamine and multivitamins. Will discontinue alprazolam and  will add as needed haldol for agitation.   2. New onset atrial flutter. EKG personally reviewed rate 85 bpm, left axis with left anterior fascicular block, normal qrs and qt, atrial flutter with variable block, no significant ST segment or T wave changes.   Will add 25 mg of metoprolol bid for rate control, his CHADS vasc score is 2, due to age and hypertension. Will continue anticoagulation with heparin for now.   3. Surgical wound infection/ MSSA bacteremia. Wbc up to 13,3. Follow up cultures with no growth. Plan for TEE.   Patient with severe encephalopathy today, likely would have to hold on TEE for now.   4. AKI with hyponatremia. Renal function with serum cr at 0,88 and K at 4,1, with serum bicarbonate at 24.  Patient with poor oral intake, will start balanced electrolyte solutions with dextrose at 50 ml per H. Continue close follow up on renal function and electrolytes.   5. Stage 2 pressure ulcer at the coccyx, present on admission. Continue with local wound care.     Status is: Inpatient  Remains inpatient appropriate because:IV treatments appropriate due to intensity of illness or inability to take PO   Dispo: The patient is from: Home              Anticipated d/c is to: SNF              Anticipated d/c date is: > 3 days              Patient currently is not medically stable to d/c.    DVT prophylaxis: Heparin   Code Status:   full  Family Communication:  No family at the bedside  Skin Documentation: Pressure Injury 07/01/19 Coccyx Stage 2 -  Partial thickness loss of dermis presenting as a shallow open injury with a red, pink wound bed without slough. (Active)  07/01/19 0800  Location: Coccyx  Location Orientation:   Staging: Stage 2 -  Partial thickness loss of dermis presenting as a shallow open injury with a red, pink wound bed without slough.  Wound Description (Comments):   Present on Admission: Yes      Antimicrobials:   Nafcillin.     Subjective: Patient is somnolent and lethargic, arouses to pain and touch, not following commands or able to respond to questions, has bilateral mittens.   Objective: Vitals:   07/03/19 1222 07/03/19 1507 07/03/19 1508 07/03/19 1655  BP: (!) 150/98 124/62  (!) 149/68  Pulse: 77 100  93  Resp: (!) 26 (!) 24  20  Temp: (!) 101.2 F (38.4 C) 98.4 F (36.9 C)  99 F (37.2 C)  TempSrc: Axillary   Axillary  SpO2: 93% (!) 86% 93% 96%  Weight:      Height:        Intake/Output Summary (Last 24 hours) at 07/03/2019 1746 Last data filed at 07/03/2019 1701 Gross per 24 hour  Intake 4502.87 ml  Output 1500 ml  Net 3002.87 ml   Filed Weights   06/30/19 1400  Weight: 66.7 kg    Examination:   General: deconditioned and ill looking appearing  Neurology: opens eyes to touch and pain, has incoherent speech, moves all 4 extremities, but not following commands.  E ENT: no pallor, no icterus, oral mucosa moist Cardiovascular: No JVD. S1-S2 present, irregularly irregular, no gallops, rubs, or murmurs. No lower extremity edema. Pulmonary: positive breath sounds bilaterally, adequate air movement, no wheezing, rhonchi or rales. Gastrointestinal. Abdomen soft with, no organomegaly, non tender, no rebound or guarding Skin. No rashes Musculoskeletal: no joint deformities     Data Reviewed: I have personally reviewed following labs and imaging studies  CBC: Recent Labs  Lab 06/26/19 2020 06/30/19 1055 07/03/19 1159  WBC 10.2 7.4 13.3*  NEUTROABS 8.7* 6.5 12.1*  HGB 11.2* 10.1* 10.2*  HCT 34.2* 30.4* 30.0*  MCV 92.2 90.2 89.8  PLT 193 166 101   Basic Metabolic Panel: Recent Labs  Lab 06/26/19 2020 06/30/19 1055 07/03/19 1159  NA 134* 127* 133*  K 4.1 4.6 4.1  CL 100 93* 98  CO2 23 22 24   GLUCOSE 159* 153* 155*  BUN 27* 41* 23  CREATININE 1.28* 1.44* 0.88  CALCIUM 9.7 8.8* 8.3*  MG  --   --  1.8   GFR: Estimated Creatinine Clearance: 65.3 mL/min (by C-G formula based on  SCr of 0.88 mg/dL). Liver Function Tests: Recent Labs  Lab 06/26/19 2020 06/30/19 1055 07/03/19 1159  AST 17 29 42*  ALT 19 23 25   ALKPHOS 49 76 102  BILITOT 1.6* 1.1 1.1  PROT 6.5 5.3* 5.0*  ALBUMIN 3.6 2.3* 1.9*   No results for input(s): LIPASE, AMYLASE in the last 168 hours. Recent Labs  Lab 07/03/19 1200  AMMONIA 9   Coagulation Profile: Recent Labs  Lab 06/30/19 1055  INR 1.1   Cardiac Enzymes: No results for input(s): CKTOTAL, CKMB, CKMBINDEX, TROPONINI in the last 168 hours. BNP (last 3 results) No results for input(s): PROBNP in the last 8760 hours. HbA1C: No results for input(s): HGBA1C in the last 72 hours. CBG: Recent Labs  Lab 07/01/19 1014  GLUCAP 147*   Lipid Profile: No results for input(s): CHOL,  HDL, LDLCALC, TRIG, CHOLHDL, LDLDIRECT in the last 72 hours. Thyroid Function Tests: No results for input(s): TSH, T4TOTAL, FREET4, T3FREE, THYROIDAB in the last 72 hours. Anemia Panel: No results for input(s): VITAMINB12, FOLATE, FERRITIN, TIBC, IRON, RETICCTPCT in the last 72 hours.    Radiology Studies: I have reviewed all of the imaging during this hospital visit personally     Scheduled Meds: . ALPRAZolam  1 mg Oral QHS  . docusate sodium  100 mg Oral BID  . PARoxetine  30 mg Oral QPM  . sodium chloride flush  3 mL Intravenous Q12H   Continuous Infusions: . sodium chloride 100 mL/hr at 07/03/19 1330  . sodium chloride    . heparin    . magnesium sulfate bolus IVPB    . nafcillin IV 2 g (07/03/19 1550)     LOS: 3 days        Mauricio Annett Gula, MD

## 2019-07-03 NOTE — Progress Notes (Signed)
Pt is lethargic and somnolent and not following commands, with incomprehensible speech. MD and PA at bedside and assessed patient. Plans for head CT at this time.

## 2019-07-03 NOTE — Progress Notes (Signed)
  Echocardiogram 2D Echocardiogram has been performed.  Neil Cordova A [name] 07/03/2019, 2:29 PM

## 2019-07-03 NOTE — H&P (View-Only) (Signed)
PROGRESS NOTE    Neil Cordova  YHC:[id number] DOB: 05-08-1940 DOA: 06/30/2019 PCP: Patient, No Pcp Per    Brief Narrative:  Patient was initially consulted on May 24 for assistance in medical management.  At that point patient had a postoperative wound infection, status post left L4-L5 microdissection on 5/11.  He also had metabolic encephalopathy, hypotension, hyponatremia and acute kidney injury.  Recommendations were to continue close neurologic monitoring, continue intravenous antibiotics and IV fluids.  His blood cultures grew MSSA and patient was taken to the OR on 5/25 for washout, finding a deep surgical site infection that did not track into the epidural space.  Infectious disease was consulted, recommendations to change antibiotic therapy to cefazolin and obtain further cardiac images to rule out endocarditis.  His echocardiogram performed today had no evidence of valvular vegetations, and the plan is to obtain a transesophageal echocardiogram in the morning.  Patient continued to be encephalopathic and not back to baseline, TRH was reconsulted for further recommendations.   Assessment & Plan:   Principal Problem:   Wound infection after surgery Active Problems:   Hyponatremia   Pressure injury of skin   Acute metabolic encephalopathy   Delirium   Atrial flutter (HCC)   1. Acute metabolic encephalopathy with delirium. Patient very lethargic and somnolent, wakes up to pain and touch. Very confused and disorientated. Last night agitated and received 1 mg of alprazolam. On Paroxetine 30 mg daily. Has not received oxycodone today. Ammonia is 9.   Patient with worsening encephalopathy likely metabolic, with features of delirium, very poor oral intake.   Will get ABG to ruled out respiratory acidosis. Continue supportive medical therapy with neuro checks and aspiration precautions. Will add IV fluids with dextrose, add thiamine and multivitamins. Will discontinue alprazolam and  will add as needed haldol for agitation.   2. New onset atrial flutter. EKG personally reviewed rate 85 bpm, left axis with left anterior fascicular block, normal qrs and qt, atrial flutter with variable block, no significant ST segment or T wave changes.   Will add 25 mg of metoprolol bid for rate control, his CHADS vasc score is 2, due to age and hypertension. Will continue anticoagulation with heparin for now.   3. Surgical wound infection/ MSSA bacteremia. Wbc up to 13,3. Follow up cultures with no growth. Plan for TEE.   Patient with severe encephalopathy today, likely would have to hold on TEE for now.   4. AKI with hyponatremia. Renal function with serum cr at 0,88 and K at 4,1, with serum bicarbonate at 24.  Patient with poor oral intake, will start balanced electrolyte solutions with dextrose at 50 ml per H. Continue close follow up on renal function and electrolytes.   5. Stage 2 pressure ulcer at the coccyx, present on admission. Continue with local wound care.     Status is: Inpatient  Remains inpatient appropriate because:IV treatments appropriate due to intensity of illness or inability to take PO   Dispo: The patient is from: Home              Anticipated d/c is to: SNF              Anticipated d/c date is: > 3 days              Patient currently is not medically stable to d/c.    DVT prophylaxis: Heparin   Code Status:   full  Family Communication:  No family at the bedside  Skin Documentation: Pressure Injury 07/01/19 Coccyx Stage 2 -  Partial thickness loss of dermis presenting as a shallow open injury with a red, pink wound bed without slough. (Active)  07/01/19 0800  Location: Coccyx  Location Orientation:   Staging: Stage 2 -  Partial thickness loss of dermis presenting as a shallow open injury with a red, pink wound bed without slough.  Wound Description (Comments):   Present on Admission: Yes      Antimicrobials:   Nafcillin.     Subjective: Patient is somnolent and lethargic, arouses to pain and touch, not following commands or able to respond to questions, has bilateral mittens.   Objective: Vitals:   07/03/19 1222 07/03/19 1507 07/03/19 1508 07/03/19 1655  BP: (!) 150/98 124/62  (!) 149/68  Pulse: 77 100  93  Resp: (!) 26 (!) 24  20  Temp: (!) 101.2 F (38.4 C) 98.4 F (36.9 C)  99 F (37.2 C)  TempSrc: Axillary   Axillary  SpO2: 93% (!) 86% 93% 96%  Weight:      Height:        Intake/Output Summary (Last 24 hours) at 07/03/2019 1746 Last data filed at 07/03/2019 1701 Gross per 24 hour  Intake 4502.87 ml  Output 1500 ml  Net 3002.87 ml   Filed Weights   06/30/19 1400  Weight: 66.7 kg    Examination:   General: deconditioned and ill looking appearing  Neurology: opens eyes to touch and pain, has incoherent speech, moves all 4 extremities, but not following commands.  E ENT: no pallor, no icterus, oral mucosa moist Cardiovascular: No JVD. S1-S2 present, irregularly irregular, no gallops, rubs, or murmurs. No lower extremity edema. Pulmonary: positive breath sounds bilaterally, adequate air movement, no wheezing, rhonchi or rales. Gastrointestinal. Abdomen soft with, no organomegaly, non tender, no rebound or guarding Skin. No rashes Musculoskeletal: no joint deformities     Data Reviewed: I have personally reviewed following labs and imaging studies  CBC: Recent Labs  Lab 06/26/19 2020 06/30/19 1055 07/03/19 1159  WBC 10.2 7.4 13.3*  NEUTROABS 8.7* 6.5 12.1*  HGB 11.2* 10.1* 10.2*  HCT 34.2* 30.4* 30.0*  MCV 92.2 90.2 89.8  PLT 193 166 101   Basic Metabolic Panel: Recent Labs  Lab 06/26/19 2020 06/30/19 1055 07/03/19 1159  NA 134* 127* 133*  K 4.1 4.6 4.1  CL 100 93* 98  CO2 23 22 24   GLUCOSE 159* 153* 155*  BUN 27* 41* 23  CREATININE 1.28* 1.44* 0.88  CALCIUM 9.7 8.8* 8.3*  MG  --   --  1.8   GFR: Estimated Creatinine Clearance: 65.3 mL/min (by C-G formula based on  SCr of 0.88 mg/dL). Liver Function Tests: Recent Labs  Lab 06/26/19 2020 06/30/19 1055 07/03/19 1159  AST 17 29 42*  ALT 19 23 25   ALKPHOS 49 76 102  BILITOT 1.6* 1.1 1.1  PROT 6.5 5.3* 5.0*  ALBUMIN 3.6 2.3* 1.9*   No results for input(s): LIPASE, AMYLASE in the last 168 hours. Recent Labs  Lab 07/03/19 1200  AMMONIA 9   Coagulation Profile: Recent Labs  Lab 06/30/19 1055  INR 1.1   Cardiac Enzymes: No results for input(s): CKTOTAL, CKMB, CKMBINDEX, TROPONINI in the last 168 hours. BNP (last 3 results) No results for input(s): PROBNP in the last 8760 hours. HbA1C: No results for input(s): HGBA1C in the last 72 hours. CBG: Recent Labs  Lab 07/01/19 1014  GLUCAP 147*   Lipid Profile: No results for input(s): CHOL,  HDL, LDLCALC, TRIG, CHOLHDL, LDLDIRECT in the last 72 hours. Thyroid Function Tests: No results for input(s): TSH, T4TOTAL, FREET4, T3FREE, THYROIDAB in the last 72 hours. Anemia Panel: No results for input(s): VITAMINB12, FOLATE, FERRITIN, TIBC, IRON, RETICCTPCT in the last 72 hours.    Radiology Studies: I have reviewed all of the imaging during this hospital visit personally     Scheduled Meds: . ALPRAZolam  1 mg Oral QHS  . docusate sodium  100 mg Oral BID  . PARoxetine  30 mg Oral QPM  . sodium chloride flush  3 mL Intravenous Q12H   Continuous Infusions: . sodium chloride 100 mL/hr at 07/03/19 1330  . sodium chloride    . heparin    . magnesium sulfate bolus IVPB    . nafcillin IV 2 g (07/03/19 1550)     LOS: 3 days        Mauricio Daniel Arrien, MD   

## 2019-07-03 NOTE — Progress Notes (Addendum)
ANTICOAGULATION CONSULT NOTE - Initial Consult  Pharmacy Consult for IV Heparin Indication: atrial fibrillation  No Known Allergies  Patient Measurements: Height: 5\' 10"  (177.8 cm) Weight: 66.7 kg (147 lb 0.8 oz) IBW/kg (Calculated) : 73 Heparin Dosing Weight: 66.7 kg  Vital Signs: Temp: 98.4 F (36.9 C) (05/27 1507) Temp Source: Axillary (05/27 1222) BP: 124/62 (05/27 1507) Pulse Rate: 100 (05/27 1507)  Labs: Recent Labs    07/03/19 1159  HGB 10.2*  HCT 30.0*  PLT 179  CREATININE 0.88    Estimated Creatinine Clearance: 65.3 mL/min (by C-G formula based on SCr of 0.88 mg/dL).   Medical History: Past Medical History:  Diagnosis Date  . Hypertension     Assessment: 79 yr old male admitted on 06/30/19 with AMS and wound drainage following L4-5 microdiscectomy at outpt surgical center on 06/17/19; he was diagnosed with lumbar wound infection and MSSA bacteremia (S/P washout on 5/25).   Pt with new atrial fibrillation; pharmacy is consulted to dose heparin (no bolus, due to recent neurosurgery); will use lower heparin level goal (0.3-0.5 units/ml) as well, due to recent neurosurgery.  Pt is S/P TTE today: EF=50-55%; plan for TEE tomorrow (5/28)  H/H 10.2/30.0, platelets 179 (CBC stable)  Goal of Therapy:  Heparin level: 0.3-0.5 units/ml  Monitor platelets by anticoagulation protocol: Yes   Plan:  Heparin infusion at 950 units/hr (no bolus, due to recent neurosurgery) Check 8-hr heparin level Monitor daily heparin level, CBC Monitor for signs/symptoms of bleeding  01-26-1975, PharmD, BCPS, [facility] Clinical Pharmacist 07/03/2019,4:47 PM

## 2019-07-03 NOTE — Progress Notes (Signed)
   07/03/19 1222  Assess: MEWS Score  Temp (!) 101.2 F (38.4 C)  BP (!) 150/98  Pulse Rate 77  ECG Heart Rate 96  Resp (!) 26  Level of Consciousness Alert  SpO2 93 %  O2 Device Room Air  Patient Activity (if Appropriate) In bed    Patient MEWS score of 3 during [place] wide [place]. Notified Costella, PA and charge nurse, Barbie of this change. TEE/Echo being done at this time, pending EKG and blood cultures after Echo. Will administer Tylenol PRN. Will take VS every 2 hours per MEWS protocol and continue to monitor.

## 2019-07-04 ENCOUNTER — Encounter (HOSPITAL_COMMUNITY): Payer: Self-pay | Admitting: Neurosurgery

## 2019-07-04 ENCOUNTER — Inpatient Hospital Stay (HOSPITAL_COMMUNITY): Payer: Medicare Other | Admitting: Anesthesiology

## 2019-07-04 ENCOUNTER — Inpatient Hospital Stay (HOSPITAL_COMMUNITY): Payer: Medicare Other

## 2019-07-04 ENCOUNTER — Encounter (HOSPITAL_COMMUNITY)
Admission: EM | Disposition: A | Discharge status: Home Health | Payer: Self-pay | Source: Home / Self Care | Attending: Neurosurgery

## 2019-07-04 DIAGNOSIS — I340 Nonrheumatic mitral (valve) insufficiency: Secondary | ICD-10-CM

## 2019-07-04 DIAGNOSIS — R7881 Bacteremia: Secondary | ICD-10-CM

## 2019-07-04 DIAGNOSIS — I5041 Acute combined systolic (congestive) and diastolic (congestive) heart failure: Secondary | ICD-10-CM

## 2019-07-04 DIAGNOSIS — I429 Cardiomyopathy, unspecified: Secondary | ICD-10-CM

## 2019-07-04 HISTORY — PX: TEE WITHOUT CARDIOVERSION: SHX5443

## 2019-07-04 LAB — BASIC METABOLIC PANEL
Anion gap: 10 (ref 5–15)
BUN: 24 mg/dL — ABNORMAL HIGH (ref 8–23)
CO2: 24 mmol/L (ref 22–32)
Calcium: 8.3 mg/dL — ABNORMAL LOW (ref 8.9–10.3)
Chloride: 102 mmol/L (ref 98–111)
Creatinine, Ser: 1 mg/dL (ref 0.61–1.24)
GFR calc Af Amer: >60 mL/min (ref >60)
GFR calc non Af Amer: >60 mL/min (ref >60)
Glucose, Bld: 181 mg/dL — ABNORMAL HIGH (ref 70–99)
Potassium: 3.7 mmol/L (ref 3.5–5.1)
Sodium: 136 mmol/L (ref 135–145)

## 2019-07-04 LAB — CBC WITH DIFFERENTIAL/PLATELET
Abs Immature Granulocytes: 0.19 10*3/uL — ABNORMAL HIGH (ref 0.00–0.07)
Basophils Absolute: 0 10*3/uL (ref 0.0–0.1)
Basophils Relative: 0 %
Eosinophils Absolute: 0 10*3/uL (ref 0.0–0.5)
Eosinophils Relative: 0 %
HCT: 32.3 % — ABNORMAL LOW (ref 39.0–52.0)
Hemoglobin: 10.8 g/dL — ABNORMAL LOW (ref 13.0–17.0)
Immature Granulocytes: 1 %
Lymphocytes Relative: 7 %
Lymphs Abs: 0.9 10*3/uL (ref 0.7–4.0)
MCH: 30.1 pg (ref 26.0–34.0)
MCHC: 33.4 g/dL (ref 30.0–36.0)
MCV: 90 fL (ref 80.0–100.0)
Monocytes Absolute: 0.8 10*3/uL (ref 0.1–1.0)
Monocytes Relative: 6 %
Neutro Abs: 12 10*3/uL — ABNORMAL HIGH (ref 1.7–7.7)
Neutrophils Relative %: 86 %
Platelets: 189 10*3/uL (ref 150–400)
RBC: 3.59 MIL/uL — ABNORMAL LOW (ref 4.22–5.81)
RDW: 14.6 % (ref 11.5–15.5)
WBC: 14 10*3/uL — ABNORMAL HIGH (ref 4.0–10.5)
nRBC: 0 % (ref 0.0–0.2)

## 2019-07-04 LAB — MAGNESIUM: Magnesium: 2.2 mg/dL (ref 1.7–2.4)

## 2019-07-04 LAB — TSH: TSH: 1.052 u[IU]/mL (ref 0.350–4.500)

## 2019-07-04 LAB — HEPARIN LEVEL (UNFRACTIONATED)
Heparin Unfractionated: <0.1 IU/mL — ABNORMAL LOW (ref 0.30–0.70)
Heparin Unfractionated: <0.1 IU/mL — ABNORMAL LOW (ref 0.30–0.70)
Heparin Unfractionated: 0.12 IU/mL — ABNORMAL LOW (ref 0.30–0.70)

## 2019-07-04 IMAGING — MR MR HEAD W/O CM
8 of 10 series · 36 of 48 positions shown · non-contrast
Comparison: Head CT [DATE]

CLINICAL DATA: Encephalopathy.

EXAM:
MRI HEAD WITHOUT CONTRAST
TECHNIQUE: Multiplanar, multiecho pulse sequences of the brain and surrounding
structures were obtained without intravenous contrast.

[Series 3: DWI · axial · 3.0mm · 1.09mm/px · z∈[-54,+90]mm · 9 of 98 slices shown (1 of 4)]
[im 1/98]
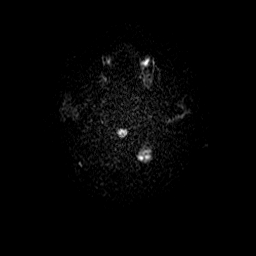
[im 13/98]
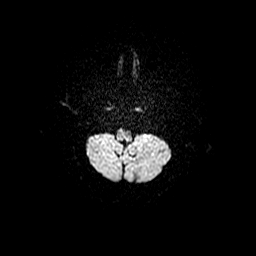
[im 25/98]
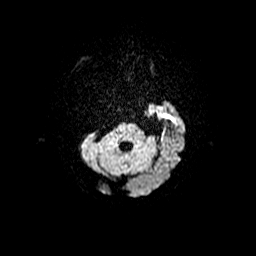
[im 37/98]
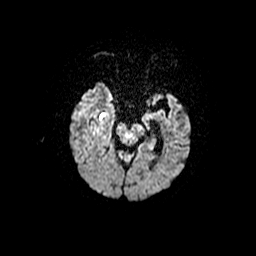
[im 49/98]
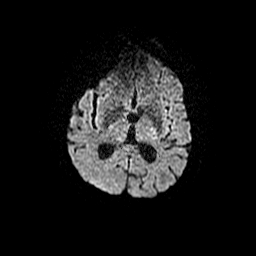
[im 61/98]
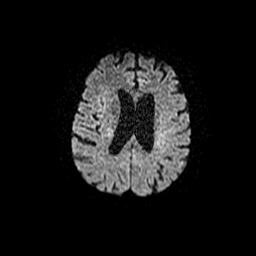
[im 73/98]
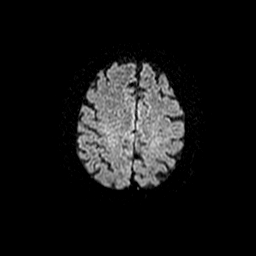
[im 85/98]
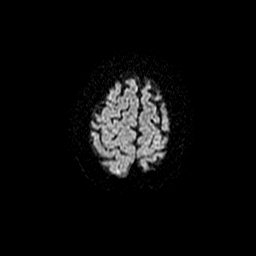
[im 98/98]
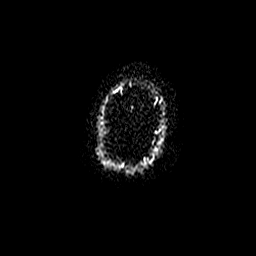

[Series 4: DWI · coronal · 5.0mm · 1.09mm/px · 6 of 72 slices shown (2 of 4)]
[im 1/72]
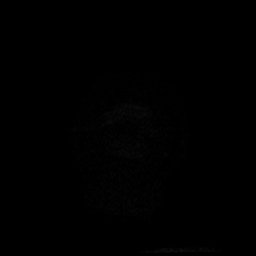
[im 15/72]
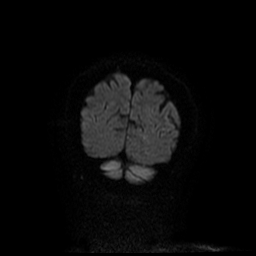
[im 29/72]
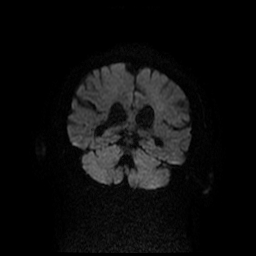
[im 43/72]
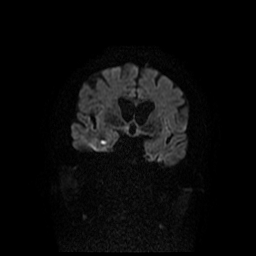
[im 57/72]
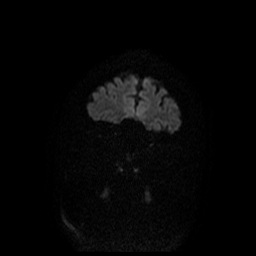
[im 72/72]
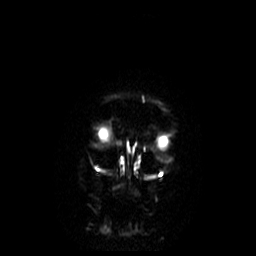

[Series 5: T1 · sagittal · 5.0mm · 0.47mm/px · 3 of 26 slices shown]
[im 1/26]
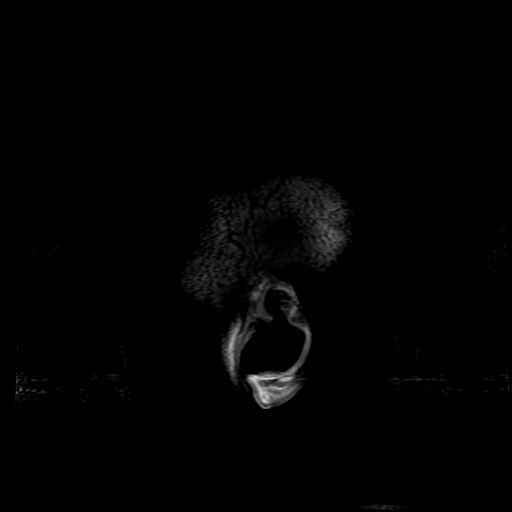
[im 13/26]
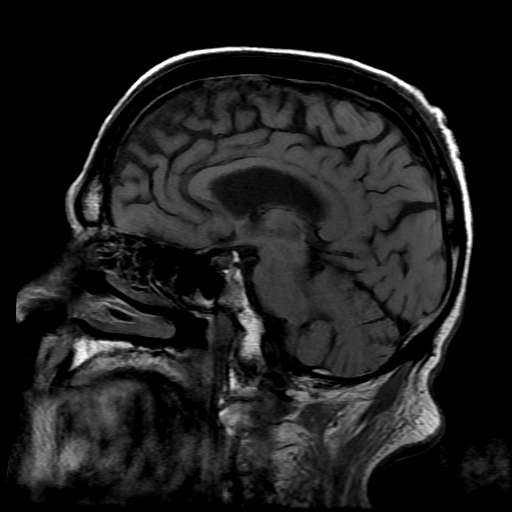
[im 26/26]
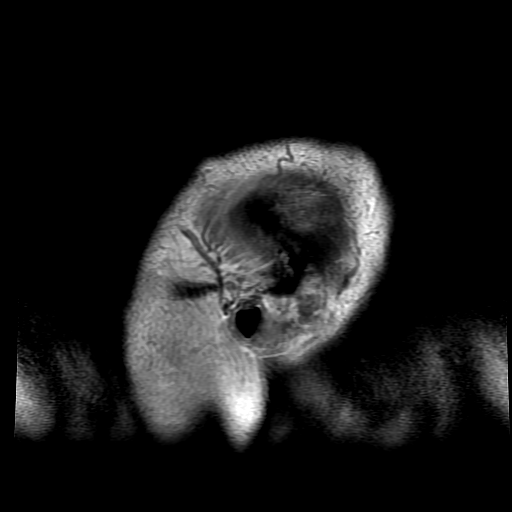

[Series 6: T2 · axial · 5.0mm · 0.43mm/px · z∈[-57,+93]mm · 3 of 26 slices shown (1 of 2)]
[im 1/26]
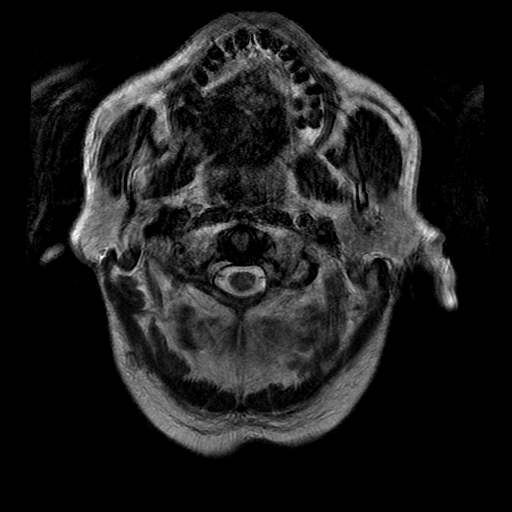
[im 13/26]
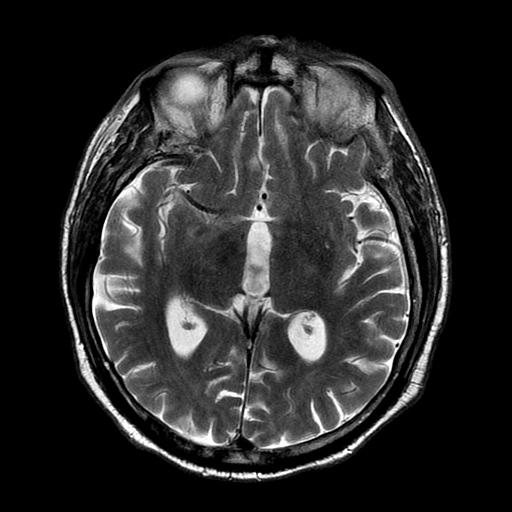
[im 26/26]
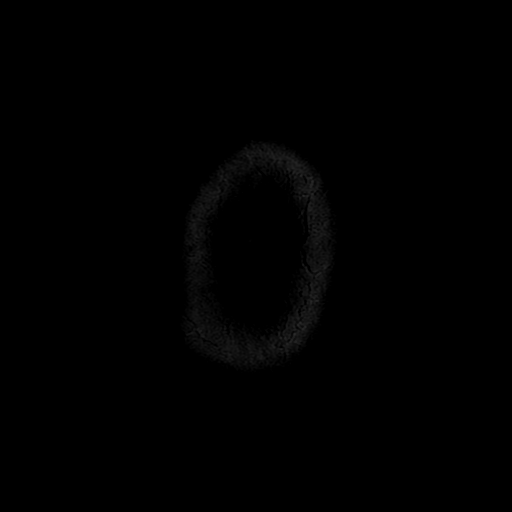

[Series 7: FLAIR · axial · 3.0mm · 0.43mm/px · z∈[-65,+85]mm · 3 of 26 slices shown]
[im 1/26]
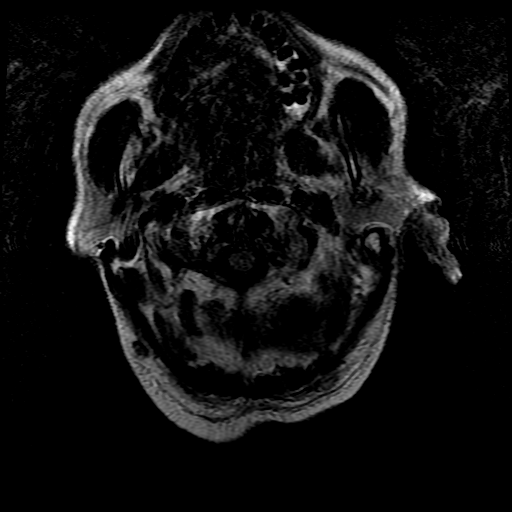
[im 13/26]
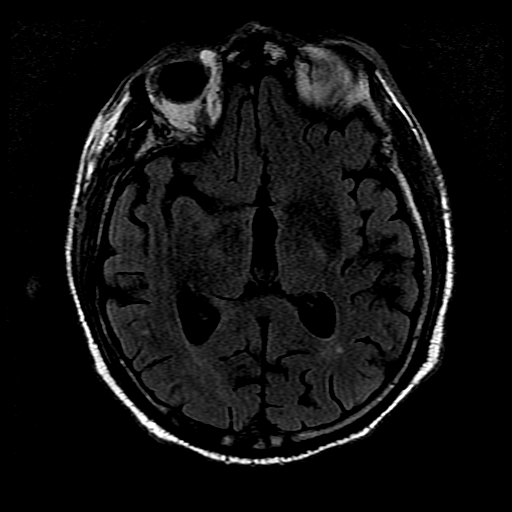
[im 26/26]
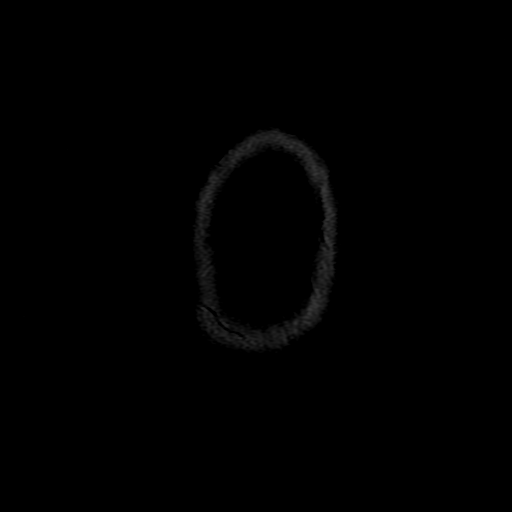

[Series 10: T2 · coronal · 5.0mm · 0.39mm/px · 3 of 28 slices shown (2 of 2)]
[im 1/28]
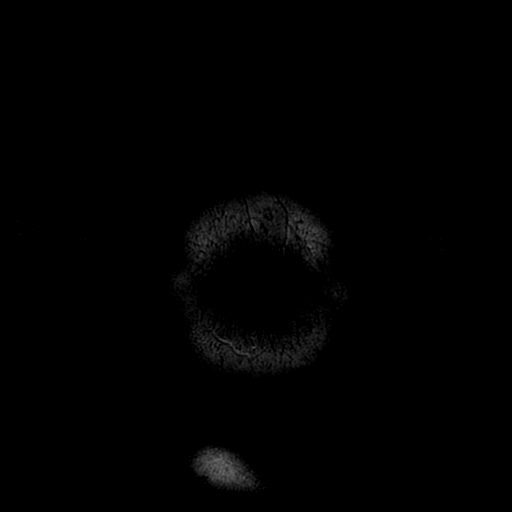
[im 14/28]
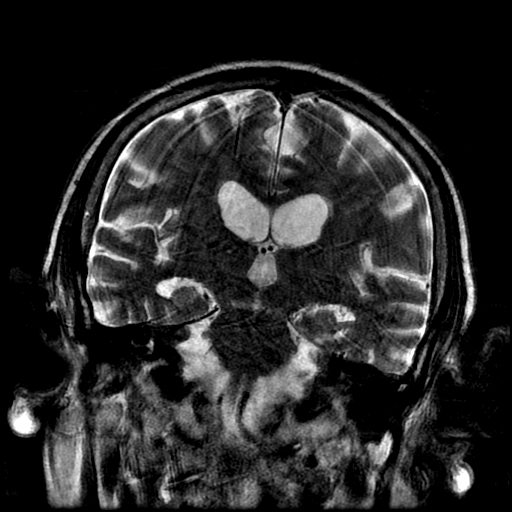
[im 28/28]
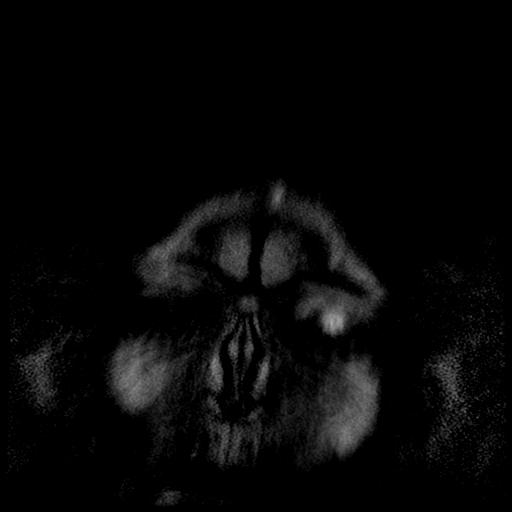

[Series 300: DWI · axial · 3.0mm · 1.09mm/px · z∈[-54,+90]mm · 5 of 49 slices shown (3 of 4)]
[im 1/49]
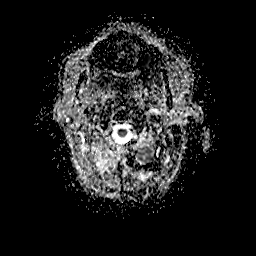
[im 13/49]
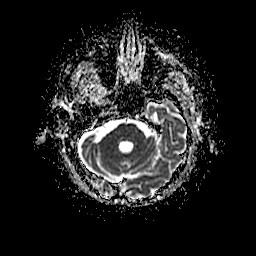
[im 25/49]
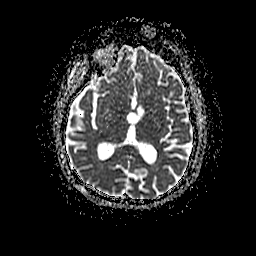
[im 37/49]
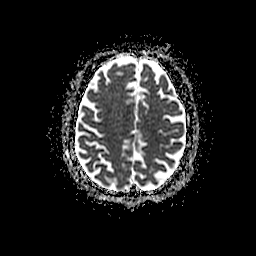
[im 49/49]
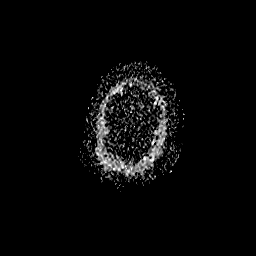

[Series 400: DWI · coronal · 5.0mm · 1.09mm/px · 4 of 36 slices shown (4 of 4)]
[im 1/36]
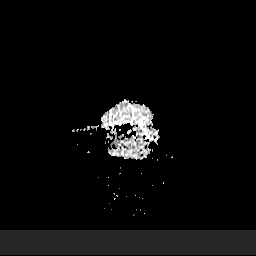
[im 12/36]
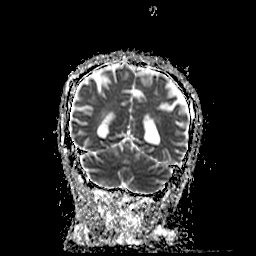
[im 24/36]
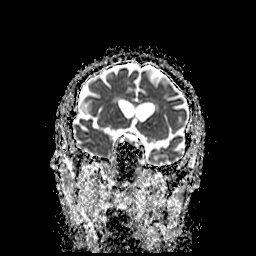
[im 36/36]
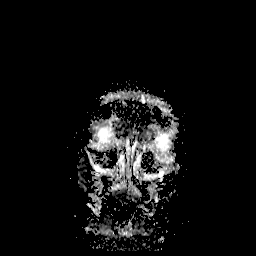

[36 of 48 positions shown; findings below may reference images not displayed]

FINDINGS: Brain: No acute infarction, hemorrhage, hydrocephalus, extra-axial
collection or mass lesion.

Scattered foci of T2 hyperintensity are seen within the white matter
of the cerebral hemispheres, nonspecific, most likely related to
chronic small vessel ischemia. Prominence of the supratentorial
ventricles and cerebral sulci reflecting parenchymal volume loss.

Vascular: Normal flow voids.

Skull and upper cervical spine: Normal marrow signal.

Sinuses/Orbits: Bilateral lens surgery. Paranasal sinuses are clear.
IMPRESSION: 1. No acute intracranial abnormality.
2. Mild chronic micro body changes of the white matter.
3. Mild parenchymal volume loss.

## 2019-07-04 IMAGING — MR MR LUMBAR SPINE WO/W CM
4 of 7 series · 18 of 48 positions shown · IV contrast (gadavist)
Comparison: Lumbar MRI [DATE]

CLINICAL DATA: Left laminectomy L4-5 [DATE]. Postoperative
wound infection. Positive blood cultures.

EXAM:
MRI LUMBAR SPINE WITHOUT AND WITH CONTRAST
TECHNIQUE: Multiplanar and multiecho pulse sequences of the lumbar spine were
obtained without and with intravenous contrast.
CONTRAST:  6.5mL GADAVIST GADOBUTROL 1 MMOL/ML IV SOLN

[Series 3: T2 · sagittal · 4.0mm · 0.53mm/px · 5 of 16 slices shown (1 of 2)]
[im 1/16]
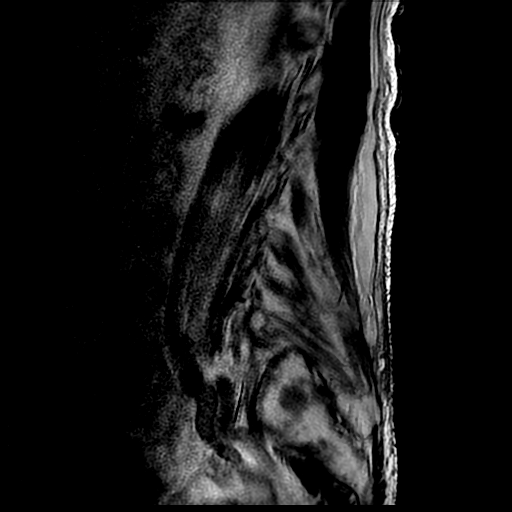
[im 4/16]
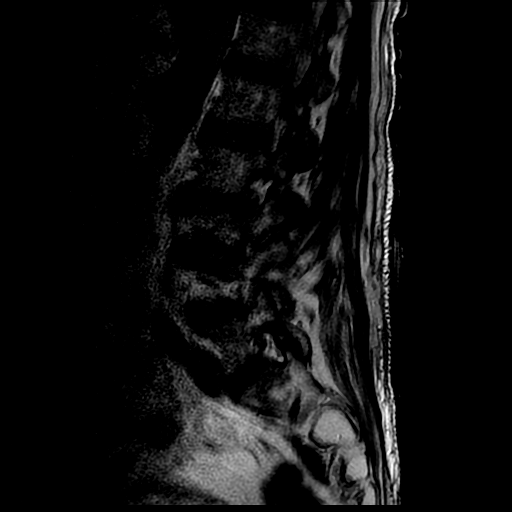
[im 8/16]
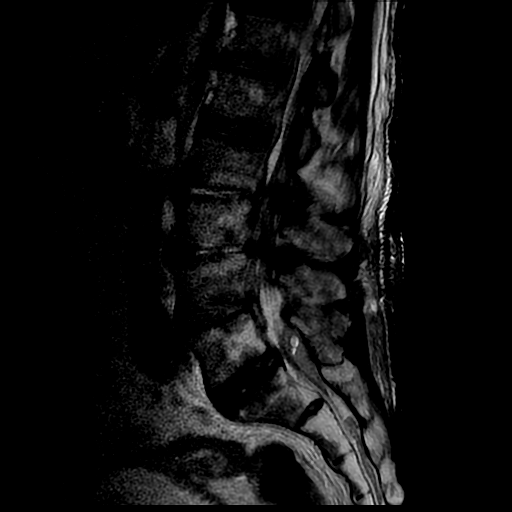
[im 12/16]
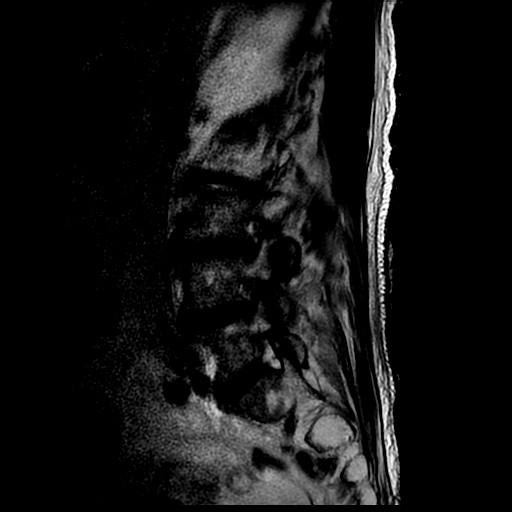
[im 16/16]
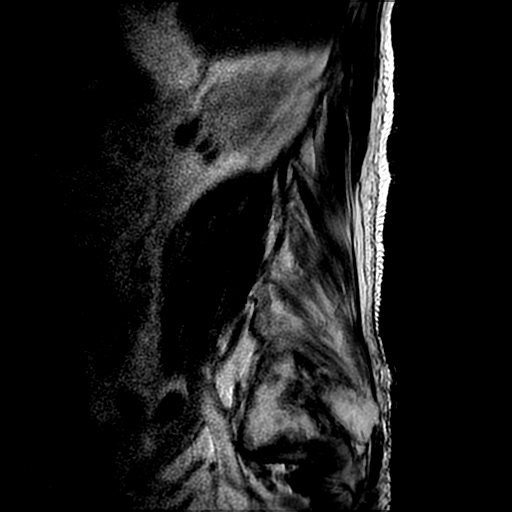

[Series 4: T1 · sagittal · 4.0mm · 0.53mm/px · 3 of 16 slices shown (1 of 2)]
[im 1/16]
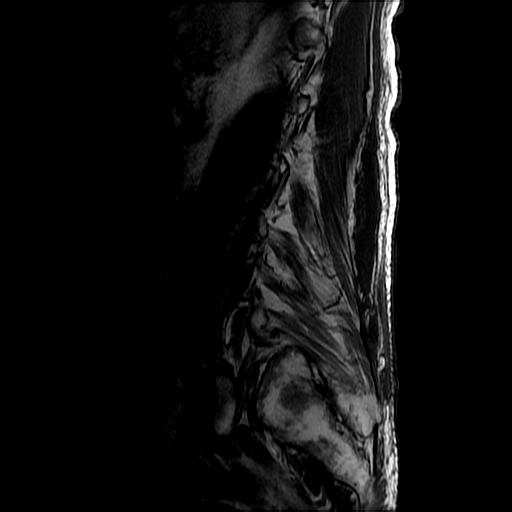
[im 8/16]
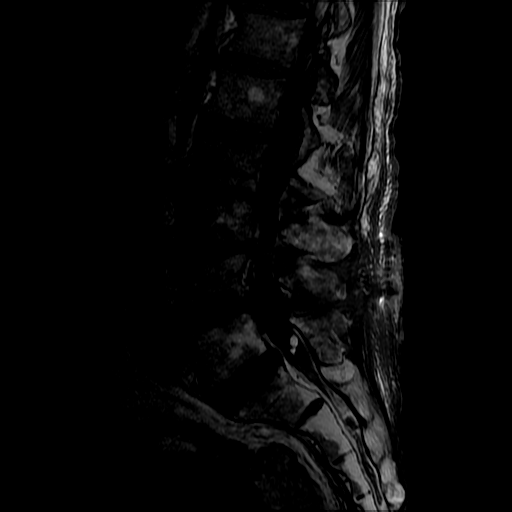
[im 16/16]
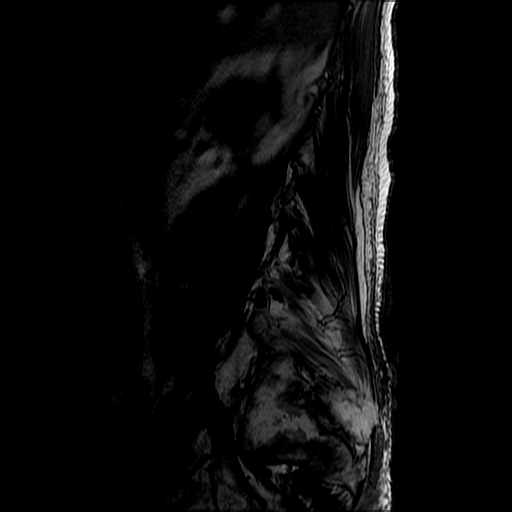

[Series 6: T2 · axial · 4.0mm · 0.39mm/px · z∈[-91,+78]mm · 7 of 40 slices shown (2 of 2)]
[im 1/40]
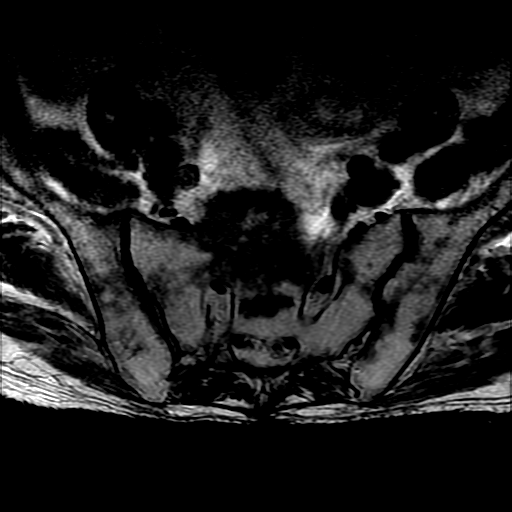
[im 5/40]
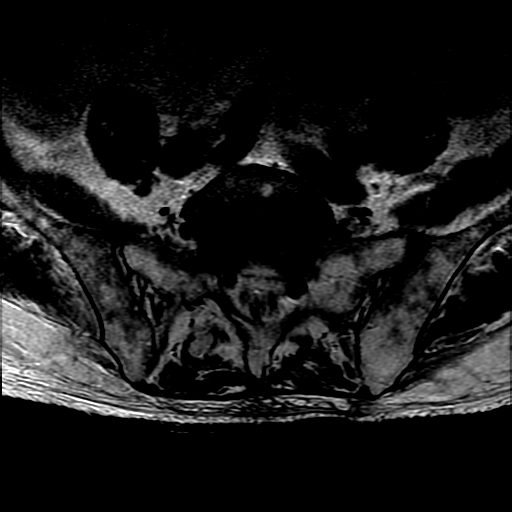
[im 14/40]
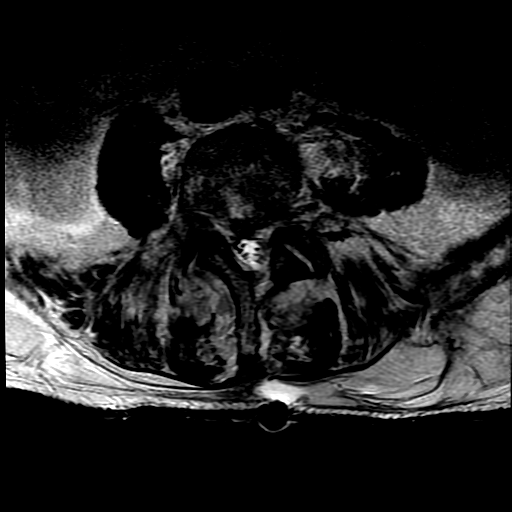
[im 18/40]
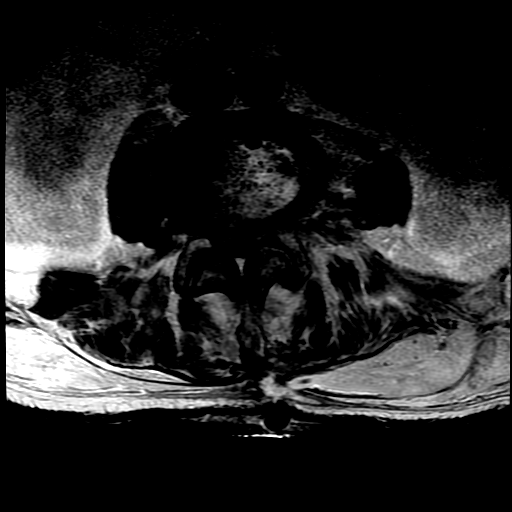
[im 22/40]
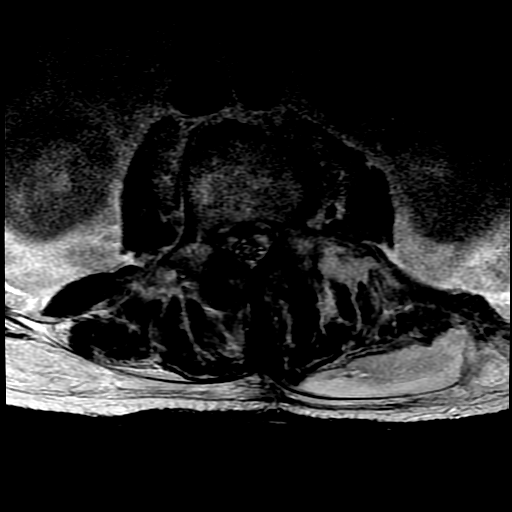
[im 27/40]
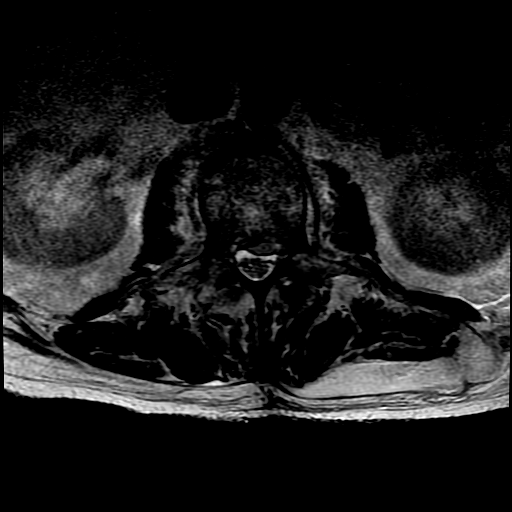
[im 35/40]
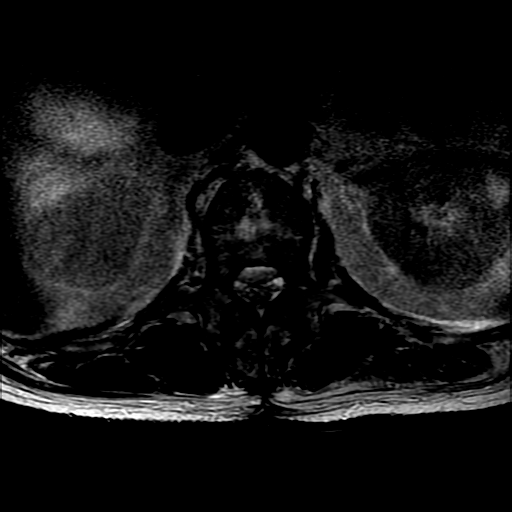

[Series 7: T1 · axial · 4.0mm · 0.39mm/px · z∈[-71,+78]mm · 3 of 40 slices shown (2 of 2)]
[im 5/40]
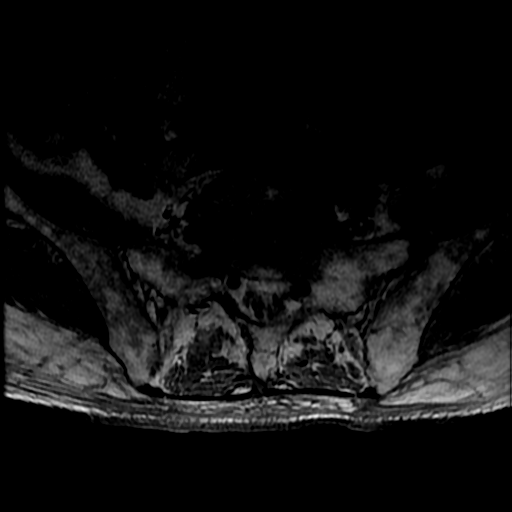
[im 22/40]
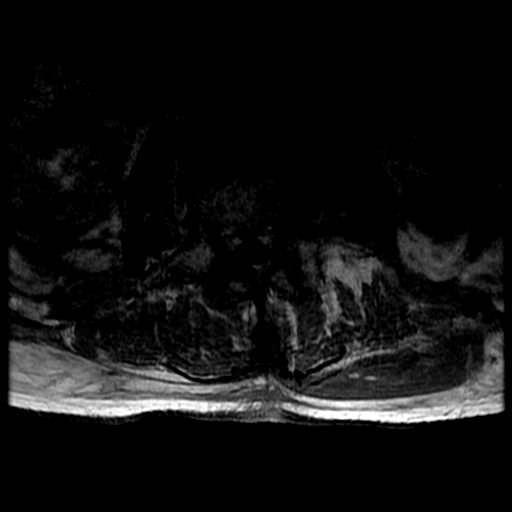
[im 35/40]
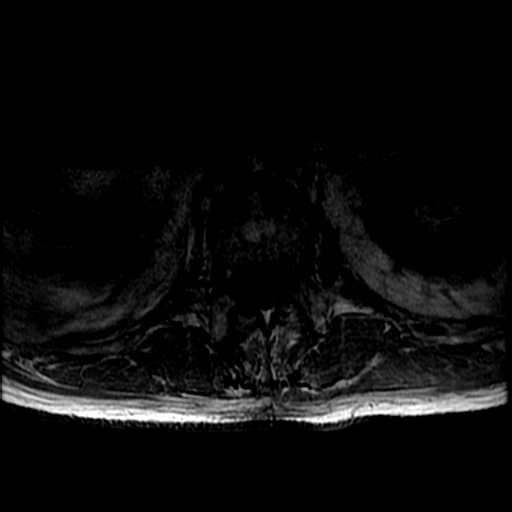

[18 of 48 positions shown; findings below may reference images not displayed]

FINDINGS: Segmentation:  Normal

Alignment:  Normal

Vertebrae:  Negative for fracture.

Increased fluid in the disc space at L2-3 and L3-4 compared to the
prior study. There is mild associated bone marrow edema and
enhancement at L2-3 and L3-4. Possible disc space infection.

Conus medullaris and cauda equina: Conus extends to the L1 level.
Conus and cauda equina appear normal.

Paraspinal and other soft tissues: Postsurgical wound in the midline
in the lower lumbar spine. Large subcutaneous fluid collection to
the left of the wound extending from L2 through L5. This measures
approximately 7.1 x 2.9 x 7.5 cm. Probable abscess.

Edema in the medial psoas muscle bilaterally which was not present
previously. This is compatible with myositis without abscess.

Ventral epidural fluid collection extending from T12 to L2-3
compatible with epidural abscess. Additional ventral epidural fluid
collection on the left at L4-L5 and posteriorly at L5-S1 compatible
with additional epidural abscess.

Disc levels:

L1-2: Ventral epidural abscess causing mild spinal stenosis.

L2-3: Increased fluid in the disc space worrisome for disc space
infection. Ventral epidural abscess extending left midline.
Bilateral facet hypertrophy and moderate to severe spinal stenosis
which has progressed in the interval.

L3-4: Fluid in the disc space. There is edema in the adjacent psoas
muscle bilaterally. Broad-based disc protrusion and spurring with
bilateral facet hypertrophy. Moderate to severe spinal stenosis and
subarticular stenosis bilaterally with progression since the prior
MRI

L4-5 ventral epidural abscess on the left. Left laminectomy.
Bilateral facet degeneration. Moderate subarticular stenosis
bilaterally.

L5-S1: Left posterior epidural abscess.
IMPRESSION: 1. Postop laminectomy on the left at L4-5 with adequate
decompression of the spinal canal

2. Ventral epidural abscess T12 to L2-3. Additional epidural abscess
on the left at L4-5 and posterolaterally on the left at L5-S1.

3. Large subcutaneous fluid collection on the left in the lumbar
spine compatible with abscess.

4. Increased fluid in the disc space at L2-3 and L3-4 with edema in
the adjacent psoas muscle. These disc spaces may be infected.

These results were called by telephone at the time of interpretation
on [DATE] at [DATE] to provider Neurosurgery PA , who verbally
acknowledged these results.

## 2019-07-04 SURGERY — ECHOCARDIOGRAM, TRANSESOPHAGEAL
Anesthesia: Monitor Anesthesia Care

## 2019-07-04 MED ORDER — THIAMINE HCL 100 MG PO TABS
100.0000 mg | ORAL_TABLET | Freq: Every day | ORAL | Status: DC
  Administered 2019-07-05 – 2019-07-10 (×6): 100 mg via ORAL
  Filled 2019-07-04 (×6): qty 1

## 2019-07-04 MED ORDER — GADOBUTROL 1 MMOL/ML IV SOLN
6.5000 mL | Freq: Once | INTRAVENOUS | Status: AC | PRN
  Administered 2019-07-04: 6.5 mL via INTRAVENOUS

## 2019-07-04 MED ORDER — SODIUM CHLORIDE 0.9 % IV SOLN: INTRAVENOUS | Status: DC

## 2019-07-04 MED ORDER — LISINOPRIL 10 MG PO TABS
10.0000 mg | ORAL_TABLET | Freq: Every day | ORAL | Status: DC
  Administered 2019-07-04 – 2019-07-10 (×7): 10 mg via ORAL
  Filled 2019-07-04 (×7): qty 1

## 2019-07-04 MED ORDER — POTASSIUM CHLORIDE CRYS ER 20 MEQ PO TBCR
40.0000 meq | EXTENDED_RELEASE_TABLET | Freq: Once | ORAL | Status: AC
  Administered 2019-07-04: 40 meq via ORAL
  Filled 2019-07-04: qty 2

## 2019-07-04 MED ORDER — SODIUM CHLORIDE 0.9 % IV SOLN
12.0000 g | INTRAVENOUS | Status: DC
  Administered 2019-07-04 – 2019-07-10 (×7): 12 g via INTRAVENOUS
  Filled 2019-07-04 (×2): qty 12000
  Filled 2019-07-04: qty 8000
  Filled 2019-07-04 (×4): qty 12000

## 2019-07-04 MED ORDER — PROPOFOL 500 MG/50ML IV EMUL
INTRAVENOUS | Status: DC | PRN
  Administered 2019-07-04: 120 ug/kg/min via INTRAVENOUS

## 2019-07-04 MED ORDER — PROPOFOL 10 MG/ML IV BOLUS
INTRAVENOUS | Status: DC | PRN
  Administered 2019-07-04: 40 mg via INTRAVENOUS

## 2019-07-04 MED ORDER — LIDOCAINE 2% (20 MG/ML) 5 ML SYRINGE
INTRAMUSCULAR | Status: DC | PRN
Start: 2019-07-04 — End: 2019-07-04
  Administered 2019-07-04: 40 mg via INTRAVENOUS

## 2019-07-04 NOTE — Progress Notes (Addendum)
  NEUROSURGERY PROGRESS NOTE   No issues overnight.  Improved mental status today Complains of back soreness Wife at bedside who is happy with his mental status today  EXAM:  BP (!) 148/74 (BP Location: Right Arm)   Pulse 77   Temp 98.9 F (37.2 C) (Oral)   Resp 15   Ht 5\' 10"  (1.778 m)   Wt 66.7 kg   SpO2 98%   BMI 21.10 kg/m   Awake, alert, oriented x4 Speech slow but appropriate  CN grossly intact  MAEW, grossly normal strength BLE Incision: bandage in place  IMPRESSION/PLAN 79 y.o. male admitted with bacteremia, sepsis 2wks after L4-5 microdiscectomy. POD3 lumbar wound wash out. Post op complicated by encephalopathy, Aflutter. Overall, improved mental status today.  - lumbar wound infection: continue abx per ID - Encephalopathy - improving: CT head negative. Likely multifactorial. Continue to monitor. Hold sedating meds as able. - Aflutter: Cards consulted. Appreciate assistance. Started on heparin. Echos look okay. Cards rec outpatient f/u. - Appreciate TH, Cards and ID care of patient - I believe patient would be a great CIR candidate. Consult placed.

## 2019-07-04 NOTE — Consult Note (Signed)
Cardiology Consultation:   Patient ID: Neil Cordova MRN: [id number]; DOB: May 10, 1940  Admit date: 06/30/2019 Date of Consult: 07/04/2019  Primary Care Provider: Patient, No Pcp Per Primary Cardiologist: New to [facility] (Dr. Cristal Deer) Primary Electrophysiologist:  None    Patient Profile:   Neil Cordova is a 79 y.o. male with a history of hypertension and recent left L4-L5 microdiscectomy who is being seen today for the evaluation of atrial flutter at the request of Dr. Conchita Paris.  History of Present Illness:   Neil Cordova is a 79 year old male with no know cardiac history. He has a history of hypertension but no known hyperlipidemia or diabetes mellitus. He is a former smoker but quit in the 1960's. No family history of heart disease.    He recently underwent left L4L5 microdiscectomy by Dr. Conchita Paris at the outpatient surgical center on 06/17/2019. Initially tolerated the procedure well. However, on 06/26/2019, he started to developed a fever, severe incision pain, and swelling around surgical site. Patient called Dr. Conchita Paris office and sent a picture of the wound but it was felt to look normal. Follow-up on 06/30/2019 in the office was recommended but patient declined. He presented to the ED later that evening but ended up leaving AMA. Patient then developed progressive weakness and confusion so wife called 911 on 06/30/2019 and he was brought to the ED. Upon arrival to the ED, he was febrile with a temp of 102.8. and he had purulent drainage from wound. BP was soft. Sepsis protocol was initiated. Patient was admitted with sepsis secondary to lumbar wound infection. Cultures were drawn and he was started on empiric antibiotics. Blood cultures came back positive for MSSA. He underwent washout on 07/01/2019. Cardiology initially asked to perform TEE which was done earlier today and showed LVEF of 30-35% with mild MR and mild PI. No valvular vegetations or left atrial appendage thrombi were  noted. He did have a a small pericardial effusion adjacent to the left atrial appendage. Cardiology now being consulted for atrial flutter with variable AV conduction.  At the time of this evaluation, patient much more alert compared to yesterday when I tried to consent him for TEE. He is now oriented to personal, date of birthday, place (hospital and [place]), and year. He is still confused though and could not tell me what brought him in. Wife at bedside and she assists with history. His only complaint at this time is back pain. He denies any cardiac symptoms. No chest pain, shortness of breath, lower extremity edema. He reports occasional hearing his heart beat "thumping" in his ear but no real palpitations. No lightheadedness, dizziness, or near syncope/syncope. No GI symptoms. No abnormal bleeding in urine or stools. Before his herniated disc, he was very active running 3-6 miles multiple days per week.    Past Medical History:  Diagnosis Date  . Hypertension     Past Surgical History:  Procedure Laterality Date  . BACK SURGERY    . LUMBAR WOUND DEBRIDEMENT N/A 07/01/2019   Procedure: LUMBAR WOUND EXPLORATION;  Surgeon: Lisbeth Renshaw, MD;  Location: [facility] OR;  Service: Neurosurgery;  Laterality: N/A;  posterior     Home Medications:  Prior to Admission medications   Medication Sig Start Date End Date Taking? Authorizing Provider  acetaminophen (TYLENOL) 325 MG tablet Take 325 mg by mouth every 6 (six) hours as needed for moderate pain.   Yes [provider]  ALPRAZolam Prudy Feeler) 1 MG tablet Take 1 mg by mouth at bedtime.  Yes [provider]  fluticasone (FLONASE) 50 MCG/ACT nasal spray Place 1 spray into both nostrils daily as needed for allergies or rhinitis.   Yes [provider]  ibuprofen (ADVIL) 200 MG tablet Take 200 mg by mouth every 6 (six) hours as needed for mild pain or moderate pain.   Yes [provider]  lisinopril (ZESTRIL) 10 MG tablet  Take 10 mg by mouth every evening.   Yes [provider]  loratadine (CLARITIN) 10 MG tablet Take 10 mg by mouth daily as needed for allergies.   Yes [provider]  oxyCODONE-acetaminophen (PERCOCET) 7.5-325 MG tablet Take 1 tablet by mouth every 4 (four) hours as needed for severe pain.   Yes [provider]  PARoxetine (PAXIL) 30 MG tablet Take 30 mg by mouth every evening.   Yes [provider]  psyllium (METAMUCIL) 58.6 % packet Take 1 packet by mouth daily as needed (constipation).   Yes [provider]  sildenafil (VIAGRA) 100 MG tablet Take 100 mg by mouth daily as needed for erectile dysfunction.   Yes [provider]  Turmeric 500 MG TABS Take 500 mg by mouth daily.   Yes [provider]    Inpatient Medications: Scheduled Meds: . docusate sodium  100 mg Oral BID  . metoprolol tartrate  25 mg Oral BID  . multivitamin  1 tablet Oral Daily  . PARoxetine  30 mg Oral QPM  . sodium chloride flush  3 mL Intravenous Q12H  . thiamine injection  100 mg Intravenous Daily   Continuous Infusions: . sodium chloride Stopped (07/04/19 0815)  . sodium chloride    . dextrose 5% lactated ringers 75 mL/hr at 07/04/19 0400  . heparin 1,200 Units/hr (07/04/19 0746)  . nafcillin (NAFCIL) continuous infusion 12 g (07/04/19 1111)   PRN Meds: sodium chloride, acetaminophen **OR** acetaminophen, bisacodyl, haloperidol **OR** haloperidol lactate, ondansetron **OR** ondansetron (ZOFRAN) IV, oxyCODONE, polyethylene glycol, sodium chloride flush  Allergies:   No Known Allergies  Social History:   Social History   Socioeconomic History  . Marital status: Married    Spouse name: Not on file  . Number of children: Not on file  . Years of education: Not on file  . Highest education level: Not on file  Occupational History  . Not on file  Tobacco Use  . Smoking status: Former Games developer  . Smokeless tobacco: Current User    Types: Snuff    Substance and Sexual Activity  . Alcohol use: Not Currently  . Drug use: Not on file  . Sexual activity: Not on file  Other Topics Concern  . Not on file  Social History Narrative  . Not on file   Social Determinants of Health   Financial Resource Strain:   . Difficulty of Paying Living Expenses:   Food Insecurity:   . Worried About Programme researcher, broadcasting/film/video in the Last Year:   . Barista in the Last Year:   Transportation Needs:   . Freight forwarder (Medical):   Marland Kitchen Lack of Transportation (Non-Medical):   Physical Activity:   . Days of Exercise per Week:   . Minutes of Exercise per Session:   Stress:   . Feeling of Stress :   Social Connections:   . Frequency of Communication with Friends and Family:   . Frequency of Social Gatherings with Friends and Family:   . Attends Religious Services:   . Active Member of Clubs or Organizations:   .  Attends Banker Meetings:   Marland Kitchen Marital Status:   Intimate Partner Violence:   . Fear of Current or Ex-Partner:   . Emotionally Abused:   Marland Kitchen Physically Abused:   . Sexually Abused:     Family History:   Family History  Problem Relation Age of Onset  . Emphysema Father   . CAD Neg Hx   . Heart failure Neg Hx      ROS:  Please see the history of present illness.  All other ROS reviewed and negative.     Physical Exam/Data:   Vitals:   07/04/19 0820 07/04/19 0830 07/04/19 0948 07/04/19 1149  BP: (!) 143/52 (!) 162/56 (!) 131/106 (!) 148/74  Pulse: 77 76 89 77  Resp: 15 (!) 21 15 15   Temp:    98.9 F (37.2 C)  TempSrc:    Oral  SpO2: 97% 99% 100% 98%  Weight:      Height:        Intake/Output Summary (Last 24 hours) at 07/04/2019 1237 Last data filed at 07/04/2019 1030 Gross per 24 hour  Intake 2144.52 ml  Output 2675 ml  Net -530.48 ml   Last 3 Weights 07/04/2019 06/30/2019  Weight (lbs) 147 lb 0.8 oz 147 lb 0.8 oz  Weight (kg) 66.7 kg 66.7 kg     Body mass index is 21.1 kg/m.  General: 79 y.o.  male resting comfortably in no acute distress. HEENT: Normocephalic and atraumatic. Sclera clear.  Neck: Supple. No carotid bruits. No JVD. Heart: RRR. Distinct S1 and S2. No murmurs, gallops, or rubs. Radial and distal pedal pulses 2+ and equal bilaterally. Lungs: No increased work of breathing. Clear to ausculation bilaterally. No wheezes, rhonchi, or rales.  Abdomen: Soft, non-distended, and non-tender to palpation. Bowel sounds present. Extremities: No lower extremity edema.     Skin: Warm and dry. Neuro: Alert and oriented to person, date of birth, place (hospital and city), year, and president. Still has some confusion and could not tell me what brought him to the hospital. No focal deficits. Psych: Normal affect.   EKG:  The EKG was personally reviewed and demonstrates:  Atrial flutter with variable AV conduction. LAFB.  Telemetry:  Telemetry was personally reviewed and demonstrates:  Atrial flutter PVCs. Baseline rates in the 70's with few brief spikes in the 90's.  Relevant CV Studies:  TTE 07/03/2019: Impressions: 1. Left ventricular ejection fraction, by estimation, is 50 to 55%. The  left ventricle has low normal function. The left ventricle has no regional  wall motion abnormalities. Left ventricular diastolic parameters are  indeterminate.  2. Right ventricular systolic function is normal. The right ventricular  size is normal.  3. Left atrial size was mild to moderately dilated.  4. The mitral valve is grossly normal. Mild mitral valve regurgitation.  5. The aortic valve is tricuspid. Aortic valve regurgitation is not  visualized.  6. The inferior vena cava is dilated in size with <50% respiratory  variability, suggesting right atrial pressure of 15 mmHg.   Conclusion(s)/Recommendation(s): No evidence of valvular vegetations on  this transthoracic echocardiogram. Would recommend a transesophageal  echocardiogram to exclude infective endocarditis if clinically  indicated.  _______________  TEE 07/04/2019: Medications: During this procedure the patient is administered a Propofol drip by 07/06/2019 CRNA.  Total of 144 mg propofol and Lidocaine 40 mg IV were given   - Left Ventrical:  Moderate- severe LV dysfunction .  EF 30-35%.  - Mitral Valve: mild MR.  No vegetation  -  Aortic Valve: 3 leaflet valve.  No AI or AS, no vegetation  - Tricuspid Valve: normal tricuspid valve,  No vegetation.  - Pulmonic Valve: normal , mild PI.  No vegetation  - Left Atrium/ Left atrial appendage: no thrombi. - There is a small pericardial effusion adjacent to the LAA  - Atrial septum: mobile intra atrial septum.  - Aorta: normal   Complications: No apparent complications. Patient did tolerate procedure well.  Laboratory Data:  High Sensitivity Troponin:  No results for input(s): TROPONINIHS in the last 720 hours.   Chemistry Recent Labs  Lab 06/30/19 1055 07/03/19 1159 07/04/19 0145  NA 127* 133* 136  K 4.6 4.1 3.7  CL 93* 98 102  CO2 22 24 24   GLUCOSE 153* 155* 181*  BUN 41* 23 24*  CREATININE 1.44* 0.88 1.00  CALCIUM 8.8* 8.3* 8.3*  GFRNONAA 46* >60 >60  GFRAA 54* >60 >60  ANIONGAP 12 11 10     Recent Labs  Lab 06/30/19 1055 07/03/19 1159  PROT 5.3* 5.0*  ALBUMIN 2.3* 1.9*  AST 29 42*  ALT 23 25  ALKPHOS 76 102  BILITOT 1.1 1.1   Hematology Recent Labs  Lab 06/30/19 1055 07/03/19 1159 07/04/19 0145  WBC 7.4 13.3* 14.0*  RBC 3.37* 3.34* 3.59*  HGB 10.1* 10.2* 10.8*  HCT 30.4* 30.0* 32.3*  MCV 90.2 89.8 90.0  MCH 30.0 30.5 30.1  MCHC 33.2 34.0 33.4  RDW 13.5 14.6 14.6  PLT 166 179 189   BNPNo results for input(s): BNP, PROBNP in the last 168 hours.  DDimer No results for input(s): DDIMER in the last 168 hours.   Radiology/Studies:  CT HEAD WO CONTRAST  Result Date: 07/03/2019 CLINICAL DATA:  Altered mental status. EXAM: CT HEAD WITHOUT CONTRAST TECHNIQUE: Contiguous axial images were obtained from the base of the skull through  the vertex without intravenous contrast. COMPARISON:  None. FINDINGS: Brain: No evidence of acute infarction, hemorrhage, hydrocephalus, extra-axial collection or mass lesion/mass effect. Vascular: No hyperdense vessel or unexpected calcification. Skull: Normal. Negative for fracture or focal lesion. Sinuses/Orbits: No acute finding. Other: None. IMPRESSION: Normal head CT. Electronically Signed   By: 07/06/19 M.D.   On: 07/03/2019 20:28   ECHOCARDIOGRAM COMPLETE  Result Date: 07/03/2019    ECHOCARDIOGRAM REPORT   Patient Name:   ELLIAS MCELREATH Date of Exam: 07/03/2019 Medical Rec #:  Naoma Diener     Height:       70.0 in Accession #:    07/05/2019    Weight:       147.0 lb Date of Birth:  September 18, 1940     BSA:          1.832 m Patient Age:    79 years      BP:           150/98 mmHg Patient Gender: M             HR:           77 bpm. Exam Location:  Inpatient Procedure: 2D Echo Indications:    Bacteremia 790.7 / R78.81  History:        Patient has no prior history of Echocardiogram examinations.                 Wound infection after surgery.  Sonographer:    06/22/1940 T[place]ine Referring Phys: [id number] Leeroy Bock [id number] IMPRESSIONS  1. Left ventricular ejection fraction, by estimation, is 50 to 55%. The left ventricle has low normal function. The  left ventricle has no regional wall motion abnormalities. Left ventricular diastolic parameters are indeterminate.  2. Right ventricular systolic function is normal. The right ventricular size is normal.  3. Left atrial size was mild to moderately dilated.  4. The mitral valve is grossly normal. Mild mitral valve regurgitation.  5. The aortic valve is tricuspid. Aortic valve regurgitation is not visualized.  6. The inferior vena cava is dilated in size with <50% respiratory variability, suggesting right atrial pressure of 15 mmHg. Conclusion(s)/Recommendation(s): No evidence of valvular vegetations on this transthoracic echocardiogram. Would recommend a transesophageal  echocardiogram to exclude infective endocarditis if clinically indicated. FINDINGS  Left Ventricle: Left ventricular ejection fraction, by estimation, is 50 to 55%. The left ventricle has low normal function. The left ventricle has no regional wall motion abnormalities. The left ventricular internal cavity size was normal in size. There is no left ventricular hypertrophy. Left ventricular diastolic parameters are indeterminate. Right Ventricle: The right ventricular size is normal. No increase in right ventricular wall thickness. Right ventricular systolic function is normal. Left Atrium: Left atrial size was mild to moderately dilated. Right Atrium: Right atrial size was normal in size. Pericardium: There is no evidence of pericardial effusion. Mitral Valve: The mitral valve is grossly normal. Mild mitral valve regurgitation. Tricuspid Valve: The tricuspid valve is grossly normal. Tricuspid valve regurgitation is trivial. Aortic Valve: The aortic valve is tricuspid. Aortic valve regurgitation is not visualized. Pulmonic Valve: The pulmonic valve was normal in structure. Pulmonic valve regurgitation is not visualized. Aorta: The aortic root and ascending aorta are structurally normal, with no evidence of dilitation. Venous: The inferior vena cava is dilated in size with less than 50% respiratory variability, suggesting right atrial pressure of 15 mmHg. IAS/Shunts: No atrial level shunt detected by color flow Doppler.  LEFT VENTRICLE PLAX 2D LVIDd:         4.90 cm  Diastology LVIDs:         3.50 cm  LV e' lateral:   18.00 cm/s LV PW:         0.70 cm  LV E/e' lateral: 7.6 LV IVS:        0.70 cm  LV e' medial:    12.00 cm/s LVOT diam:     2.00 cm  LV E/e' medial:  11.4 LV SV:         81 LV SV Index:   44 LVOT Area:     3.14 cm  RIGHT VENTRICLE TAPSE (M-mode): 1.7 cm LEFT ATRIUM             Index       RIGHT ATRIUM           Index LA diam:        4.60 cm 2.51 cm/m  RA Area:     17.40 cm LA Vol (A2C):   79.1 ml 43.19  ml/m RA Volume:   43.10 ml  23.53 ml/m LA Vol (A4C):   61.3 ml 33.47 ml/m LA Biplane Vol: 70.5 ml 38.49 ml/m  AORTIC VALVE LVOT Vmax:   167.00 cm/s LVOT Vmean:  112.000 cm/s LVOT VTI:    0.259 m  AORTA Ao Root diam: 3.40 cm MITRAL VALVE MV Area (PHT): 6.65 cm     SHUNTS MV Decel Time: 114 msec     Systemic VTI:  0.26 m MV E velocity: 137.00 cm/s  Systemic Diam: 2.00 cm MV A velocity: 96.10 cm/s MV E/A ratio:  1.43 Lyman Bishop MD Electronically signed by Lyman Bishop  MD Signature Date/Time: 07/03/2019/3:16:13 PM    Final    Assessment and Plan:   Atrial Flutter - Patient admitted with sepsis/bacteremia secondary lumbar wound infection following recent surgery. Has since gone in to atrial flutter.  - Telemetry shows atrial flutter with PVCs. Rate controlled with baseline rates in the 70's.  - Potassium 3.7 today. Will give dose of K-Dur 40 mEq. - Magnesium 1.8 yesterday. Supplemented yesterday. Will recheck today. - TSH pending.  - LVEF of 50-55% on TTE yesterday but 30-35% on TEE today. Will review with MD. - Started on Lopressor 25mg  twice daily today. Can transition to Toprol tomorrow for slightly low EF. - CHA2DS2-VASc = 3-4 (? CHF, HTN, age x2). On IV Heparin. Can switch to Eliquis when it is clear no other procedures will be done and OK with Neurosurgery.   - Likely being drive by sepsis. Can consider DCCV once patient recovers if he does not convert back to sinus rhythm or if rates become uncontrolled.  Possible Cardiomyopathy - LVEF of 50-55% on TTE yesterday but 30-35% on TEE today. Will review with MD. - No evidence of volume overload on exam. - Continue beta-blocker.  - Renal function has improved so can restart home Lisinopril 10mg  daily.  - Possibly secondary to acute illness with sepsis. Can repeat Echo as outpatient once he recovers.   Hypertension - BP mildly elevated at times. - Home Lisinopril on hold given AKI. Renal function has improved so will restart.   AKI -  Resolved - Creatinine 1.44 on admission but improved to 1.00 today.  - Continue to monitor.   Sepsis/Bactermia Secondary to Lumbar Wound Infection - Underwent washout on 06/30/2019.  - Continue antibiotics per primary team.   Encephalopathy - Still confused but improving. Much more alert and oriented today. - Management per primary team.  For questions or updates, please contact [facility] Please consult [email or web address] for contact info under     Signed, Corrin ParkerCallie E Missie Gehrig, PA-C  07/04/2019 12:37 PM

## 2019-07-04 NOTE — Progress Notes (Signed)
  Echocardiogram Echocardiogram Transesophageal has been performed.  Delcie Roch 07/04/2019, 8:19 AM

## 2019-07-04 NOTE — Progress Notes (Signed)
ANTICOAGULATION CONSULT NOTE - Follow Up Consult  Pharmacy Consult for heparin Indication: atrial fibrillation  Labs: Recent Labs    07/03/19 1159 07/04/19 0145  HGB 10.2* 10.8*  HCT 30.0* 32.3*  PLT 179 189  HEPARINUNFRC  --  <0.10*  CREATININE 0.88  --     Assessment: 79yo male subtherapeutic on heparin with initial dosing for new Afib in setting of recent neurosurgery; no gtt issues or signs of bleeding per RN.  Goal of Therapy:  Heparin level 0.3-0.5 units/ml   Plan:  Will increase heparin gtt by 4 units/kg/hr to 1200 units/hr and check level in 6 hours.    Vernard Gambles, PharmD, BCPS  07/04/2019,2:46 AM

## 2019-07-04 NOTE — Progress Notes (Signed)
I have reviewed the MRI Brain and lumbar spine. Brain MRI largely unremarkable. 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 There is also possible discitis at L2-3 and L3-4.   Pts encephalopathy appears somewhat improved today and normal MRI brain suggests etiology is likely metabolic. At this point the patient appears to have good strength in the lower extremities and intact bladder function. Therefore, although there is an abscess with some stenosis, decompression would require large, multi-level laminectomy from L1-L5. Would therefore prefer to cont to treat with IV abx, monitor neurologic exam, and if stable plan on repeat MRI L-spine in a few weeks.

## 2019-07-04 NOTE — Anesthesia Procedure Notes (Signed)
Procedure Name: MAC Date/Time: 07/04/2019 7:46 AM Performed by: Leonor Liv, CRNA Pre-anesthesia Checklist: Patient identified, Emergency Drugs available, Suction available, Patient being monitored and Timeout performed Patient Re-evaluated:Patient Re-evaluated prior to induction Oxygen Delivery Method: Nasal cannula Airway Equipment and Method: Bite block Placement Confirmation: positive ETCO2 Dental Injury: Teeth and Oropharynx as per pre-operative assessment

## 2019-07-04 NOTE — Progress Notes (Signed)
[facility]    Date of Admission:  06/30/2019   Total days of antibiotics 4  ID: Neil Cordova is a 79 y.o. male with  MSSA SSI with secondary bacteremia and concern for epidural abscess Principal Problem:   Wound infection after surgery Active Problems:   Hyponatremia   Pressure injury of skin   Acute metabolic encephalopathy   Delirium   Atrial flutter (HCC)   Acute combined systolic and diastolic congestive heart failure (HCC)   Bacteremia    Subjective: Afebrile, mentation is much better. Had TEE that ruled out endocarditis but has decreased EF of 30%  Medications:  . docusate sodium  100 mg Oral BID  . lisinopril  10 mg Oral Daily  . metoprolol tartrate  25 mg Oral BID  . multivitamin  1 tablet Oral Daily  . PARoxetine  30 mg Oral QPM  . sodium chloride flush  3 mL Intravenous Q12H  . thiamine  100 mg Oral Daily    Objective: Vital signs in last 24 hours: Temp:  [97 F (36.1 C)-98.9 F (37.2 C)] 98.4 F (36.9 C) (05/28 1613) Pulse Rate:  [65-89] 76 (05/28 1613) Resp:  [13-26] 20 (05/28 1613) BP: (117-162)/(52-106) 141/76 (05/28 1613) SpO2:  [92 %-100 %] 92 % (05/28 1613) Weight:  [66.7 kg] 66.7 kg (05/28 0704) Physical Exam  Constitutional: He is oriented to person, place, and time. He appears well-developed and well-nourished. No distress.  HENT:  Mouth/Throat: Oropharynx is clear and moist. No oropharyngeal exudate.  Cardiovascular: Normal rate, regular rhythm and normal heart sounds. Exam reveals no gallop and no friction rub.  No murmur heard.  Pulmonary/Chest: Effort normal and breath sounds normal. No respiratory distress. He has no wheezes.  Abdominal: Soft. Bowel sounds are normal. He exhibits no distension. There is no tenderness.  Lymphadenopathy:  He has no cervical adenopathy.  Neurological: He is alert and oriented to person, place, and time.  Skin: Skin is warm and dry. No rash noted. No erythema.  Psychiatric: He  has a normal mood and affect. His behavior is normal.     Lab Results Recent Labs    07/03/19 1159 07/04/19 0145  WBC 13.3* 14.0*  HGB 10.2* 10.8*  HCT 30.0* 32.3*  NA 133* 136  K 4.1 3.7  CL 98 102  CO2 24 24  BUN 23 24*  CREATININE 0.88 1.00   Liver Panel Recent Labs    07/03/19 1159  PROT 5.0*  ALBUMIN 1.9*  AST 42*  ALT 25  ALKPHOS 102  BILITOT 1.1   Lab Results  Component Value Date   ESRSEDRATE 50 (H) 07/01/2019    C-Reactive Protein Recent Labs    07/03/19 1159  CRP 22.3*    Microbiology: 5/26 blood cx ngtd 5/28 blood cx ngtd Studies/Results: CT HEAD WO CONTRAST  Result Date: 07/03/2019 CLINICAL DATA:  Altered mental status. EXAM: CT HEAD WITHOUT CONTRAST TECHNIQUE: Contiguous axial images were obtained from the base of the skull through the vertex without intravenous contrast. COMPARISON:  None. FINDINGS: Brain: No evidence of acute infarction, hemorrhage, hydrocephalus, extra-axial collection or mass lesion/mass effect. Vascular: No hyperdense vessel or unexpected calcification. Skull: Normal. Negative for fracture or focal lesion. Sinuses/Orbits: No acute finding. Other: None. IMPRESSION: Normal head CT. Electronically Signed   By: Lupita Raider M.D.   On: 07/03/2019 20:28   MR BRAIN WO CONTRAST  Result Date: 07/04/2019 CLINICAL DATA:  Encephalopathy. EXAM: MRI HEAD WITHOUT CONTRAST TECHNIQUE: Multiplanar, multiecho  pulse sequences of the brain and surrounding structures were obtained without intravenous contrast. COMPARISON:  Head CT Jul 03, 2019 FINDINGS: Brain: No acute infarction, hemorrhage, hydrocephalus, extra-axial collection or mass lesion. Scattered foci of T2 hyperintensity are seen within the white matter of the cerebral hemispheres, nonspecific, most likely related to chronic small vessel ischemia. Prominence of the supratentorial ventricles and cerebral sulci reflecting parenchymal volume loss. Vascular: Normal flow voids. Skull and upper  cervical spine: Normal marrow signal. Sinuses/Orbits: Bilateral lens surgery. Paranasal sinuses are clear. IMPRESSION: 1. No acute intracranial abnormality. 2. Mild chronic micro body changes of the white matter. 3. Mild parenchymal volume loss. Electronically Signed   By: Pedro Earls M.D.   On: 07/04/2019 14:53   MR Lumbar Spine W Wo Contrast  Result Date: 07/04/2019 CLINICAL DATA:  Left laminectomy L4-5 06/17/2019. Postoperative wound infection. Positive blood cultures. EXAM: MRI LUMBAR SPINE WITHOUT AND WITH CONTRAST TECHNIQUE: Multiplanar and multiecho pulse sequences of the lumbar spine were obtained without and with intravenous contrast. CONTRAST:  6.47mL GADAVIST GADOBUTROL 1 MMOL/ML IV SOLN COMPARISON:  Lumbar MRI 06/05/2019 FINDINGS: Segmentation:  Normal Alignment:  Normal Vertebrae:  Negative for fracture. Increased fluid in the disc space at L2-3 and L3-4 compared to the prior study. There is mild associated bone marrow edema and enhancement at L2-3 and L3-4. Possible disc space infection. Conus medullaris and cauda equina: Conus extends to the L1 level. Conus and cauda equina appear normal. Paraspinal and other soft tissues: Postsurgical wound in the midline in the lower lumbar spine. Large subcutaneous fluid collection to the left of the wound extending from L2 through L5. This measures approximately 7.1 x 2.9 x 7.5 cm. Probable abscess. Edema in the medial psoas muscle bilaterally which was not present previously. 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 Ventral epidural fluid collection extending from T12 to L2-3 compatible with epidural abscess. Additional ventral epidural fluid collection on the left at L4-L5 and posteriorly at L5-S1 compatible with additional epidural abscess. Disc levels: L1-2: Ventral epidural abscess causing mild spinal stenosis. L2-3: Increased fluid in the disc space worrisome for disc space infection. Ventral epidural abscess extending left  midline. Bilateral facet hypertrophy and moderate to severe spinal stenosis which has progressed in the interval. L3-4: Fluid in the disc space. There is edema in the adjacent psoas muscle bilaterally. Broad-based disc protrusion and spurring with bilateral facet hypertrophy. 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 Left laminectomy. Bilateral facet degeneration. Moderate subarticular stenosis bilaterally. L5-S1: Left posterior epidural abscess. IMPRESSION: 1. Postop laminectomy on the left at L4-5 with adequate decompression of the spinal canal 2. 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 Additional epidural abscess on the left at L4-5 and posterolaterally on the left at L5-S1. 3. Large subcutaneous fluid collection on the left in the lumbar spine compatible with abscess. 4. Increased fluid in the disc space at L2-3 and L3-4 with edema in the adjacent psoas muscle. These disc spaces may be infected. These results were called by telephone at the time of interpretation on 07/04/2019 at 4:12 pm to provider Neurosurgery PA , who verbally acknowledged these results. Electronically Signed   By: Franchot Gallo M.D.   On: 07/04/2019 16:12   ECHOCARDIOGRAM COMPLETE  Result Date: 07/03/2019    ECHOCARDIOGRAM REPORT   Patient Name:   CLEBURN MAIOLO Date of Exam: 07/03/2019 Medical Rec #:  [id number]     Height:  70.0 in Accession #:    [id number]    Weight:       147.0 lb Date of Birth:  11/04/1940     BSA:          1.832 m Patient Age:    79 years      BP:           150/98 mmHg Patient Gender: M             HR:           77 bpm. Exam Location:  Inpatient Procedure: 2D Echo Indications:    Bacteremia 790.7 / R78.81  History:        Patient has no prior history of Echocardiogram examinations.                 Wound infection after surgery.  Sonographer:    Leeroy Bockhelsea Turrentine Referring Phys: [id number] Marlane HatcherNEELESH [id number]  IMPRESSIONS  1. Left ventricular ejection fraction, by estimation, is 50 to 55%. The left ventricle has low normal function. The left ventricle has no regional wall motion abnormalities. Left ventricular diastolic parameters are indeterminate.  2. Right ventricular systolic function is normal. The right ventricular size is normal.  3. Left atrial size was mild to moderately dilated.  4. The mitral valve is grossly normal. Mild mitral valve regurgitation.  5. The aortic valve is tricuspid. Aortic valve regurgitation is not visualized.  6. The inferior vena cava is dilated in size with <50% respiratory variability, suggesting right atrial pressure of 15 mmHg. Conclusion(s)/Recommendation(s): No evidence of valvular vegetations on this transthoracic echocardiogram. Would recommend a transesophageal echocardiogram to exclude infective endocarditis if clinically indicated. FINDINGS  Left Ventricle: Left ventricular ejection fraction, by estimation, is 50 to 55%. The left ventricle has low normal function. The left ventricle has no regional wall motion abnormalities. The left ventricular internal cavity size was normal in size. There is no left ventricular hypertrophy. Left ventricular diastolic parameters are indeterminate. Right Ventricle: The right ventricular size is normal. No increase in right ventricular wall thickness. Right ventricular systolic function is normal. Left Atrium: Left atrial size was mild to moderately dilated. Right Atrium: Right atrial size was normal in size. Pericardium: There is no evidence of pericardial effusion. Mitral Valve: The mitral valve is grossly normal. Mild mitral valve regurgitation. Tricuspid Valve: The tricuspid valve is grossly normal. Tricuspid valve regurgitation is trivial. Aortic Valve: The aortic valve is tricuspid. Aortic valve regurgitation is not visualized. Pulmonic Valve: The pulmonic valve was normal in structure. Pulmonic valve regurgitation is not visualized. Aorta:  The aortic root and ascending aorta are structurally normal, with no evidence of dilitation. Venous: The inferior vena cava is dilated in size with less than 50% respiratory variability, suggesting right atrial pressure of 15 mmHg. IAS/Shunts: No atrial level shunt detected by color flow Doppler.  LEFT VENTRICLE PLAX 2D LVIDd:         4.90 cm  Diastology LVIDs:         3.50 cm  LV e' lateral:   18.00 cm/s LV PW:         0.70 cm  LV E/e' lateral: 7.6 LV IVS:        0.70 cm  LV e' medial:    12.00 cm/s LVOT diam:     2.00 cm  LV E/e' medial:  11.4 LV SV:         81 LV SV Index:   44 LVOT Area:     3.14 cm  RIGHT VENTRICLE TAPSE (M-mode): 1.7 cm LEFT ATRIUM             Index       RIGHT ATRIUM           Index LA diam:        4.60 cm 2.51 cm/m  RA Area:     17.40 cm LA Vol (A2C):   79.1 ml 43.19 ml/m RA Volume:   43.10 ml  23.53 ml/m LA Vol (A4C):   61.3 ml 33.47 ml/m LA Biplane Vol: 70.5 ml 38.49 ml/m  AORTIC VALVE LVOT Vmax:   167.00 cm/s LVOT Vmean:  112.000 cm/s LVOT VTI:    0.259 m  AORTA Ao Root diam: 3.40 cm MITRAL VALVE MV Area (PHT): 6.65 cm     SHUNTS MV Decel Time: 114 msec     Systemic VTI:  0.26 m MV E velocity: 137.00 cm/s  Systemic Diam: 2.00 cm MV A velocity: 96.10 cm/s MV E/A ratio:  1.43 Zoila Shutter MD Electronically signed by Zoila Shutter MD Signature Date/Time: 07/03/2019/3:16:13 PM    Final    ECHO TEE  Result Date: 07/04/2019    TRANSESOPHOGEAL ECHO REPORT   Patient Name:   [facility] Date of Exam: 07/04/2019 Medical Rec #:  [id number]     Height:       70.0 in Accession #:    [id number]    Weight:       147.0 lb Date of Birth:  1940-10-03     BSA:          1.832 m Patient Age:    79 years      BP:           128/47 mmHg Patient Gender: M             HR:           76 bpm. Exam Location:  Inpatient Procedure: Transesophageal Echo and Color Doppler Indications:     bacteremia  History:         Patient has prior history of Echocardiogram examinations, most                  recent  07/03/2019. Arrythmias:Atrial Flutter.  Sonographer:     Delcie Roch Referring Phys:  [id number] Kelton Pillar GOODRICH Diagnosing Phys: Kristeen Miss MD PROCEDURE: The transesophogeal probe was passed without difficulty through the esophogus of the patient. Sedation performed by different physician. The patient was monitored while under deep sedation. Anesthestetic sedation was provided intravenously by Anesthesiology: 144mg  of Propofol, 40mg  of Lidocaine. The patient developed no complications during the procedure. IMPRESSIONS  1. Left ventricular ejection fraction, by estimation, is 35 to 40%. The left ventricle has moderately decreased function. The left ventricle demonstrates global hypokinesis.  2. Right ventricular systolic function is normal. The right ventricular size is normal.  3. No left atrial/left atrial appendage thrombus was detected.  4. The mitral valve is grossly normal. Mild mitral valve regurgitation. No evidence of mitral stenosis.  5. The aortic valve is normal in structure. Aortic valve regurgitation is not visualized. No aortic stenosis is present. FINDINGS  Left Ventricle: Left ventricular ejection fraction, by estimation, is 35 to 40%. The left ventricle has moderately decreased function. The left ventricle demonstrates global hypokinesis. The left ventricular internal cavity size was normal in size. There is no left ventricular hypertrophy. Right Ventricle: The right ventricular size is normal. No increase in right ventricular wall thickness. Right ventricular systolic function is normal. Left  Atrium: Left atrial size was normal in size. No left atrial/left atrial appendage thrombus was detected. Right Atrium: Right atrial size was normal in size. Pericardium: A small pericardial effusion is present. Mitral Valve: The mitral valve is grossly normal. Mild mitral valve regurgitation. No evidence of mitral valve stenosis. There is no evidence of mitral valve vegetation. Tricuspid Valve: The  tricuspid valve is grossly normal. Tricuspid valve regurgitation is mild . No evidence of tricuspid stenosis. There is no evidence of tricuspid valve vegetation. Aortic Valve: The aortic valve is normal in structure. Aortic valve regurgitation is not visualized. No aortic stenosis is present. There is no evidence of aortic valve vegetation. Pulmonic Valve: The pulmonic valve was grossly normal. Pulmonic valve regurgitation is trivial. No evidence of pulmonic stenosis. There is no evidence of pulmonic valve vegetation. Aorta: The aortic root and ascending aorta are structurally normal, with no evidence of dilitation. There is minimal (Grade I) plaque. IAS/Shunts: The atrial septum is grossly normal. Kristeen Miss MD Electronically signed by Kristeen Miss MD Signature Date/Time: 07/04/2019/3:27:35 PM    Final      Assessment/Plan: Disseminated MSSA infection/SSI s/p I XD but still had ongoing fevers and leukocytosis prompting further imaging - his MRI shows ventral epidural abscess from T12- L3 and L4-L5. Also large subcutaneous fluid collection left of lumbar spine.   - concern for bulky disease ( Large subcutaneous fluid collection to the left of the wound extending from L2 through L5. approximately 7.1 x 2.9 x 7.5 cm. Probable abscess.) This will be very difficult for antibioitcs alone to treat burden of infection. Recommend to have I xD and if not, then have IR place drain so that abscess can be drained - plan to treat for 8 wk with nafcillin if kidney function can tolerate - can place picc line since blood cx have cleared  Will see back on Monday, dr Zenaida Niece dam available over the weekend.  [facility] Cell: [phone] Pager: [phone]  07/04/2019, 5:39 PM

## 2019-07-04 NOTE — Progress Notes (Signed)
PT Cancellation Note  Patient Details Name: Pravin Perezperez MRN: [id number] DOB: 10/21/1940   Cancelled Treatment:    Reason Eval/Treat Not Completed: Patient at procedure or test/unavailable. Pt off unit at MRI. PT will attempt to follow up as time allows.   Arlyss Gandy 07/04/2019, 2:07 PM

## 2019-07-04 NOTE — Interval H&P Note (Signed)
History and Physical Interval Note:  07/04/2019 7:40 AM  Neil Cordova  has presented today for surgery, with the diagnosis of BACTEREMIA.  The various methods of treatment have been discussed with the patient and family. After consideration of risks, benefits and other options for treatment, the patient has consented to  Procedure(s): TRANSESOPHAGEAL ECHOCARDIOGRAM (TEE) (N/A) as a surgical intervention.  The patient's history has been reviewed, patient examined, no change in status, stable for surgery.  I have reviewed the patient's chart and labs.  Questions were answered to the patient's satisfaction.     Kristeen Miss

## 2019-07-04 NOTE — Progress Notes (Signed)
  NEUROSURGERY PROGRESS NOTE   Worsening confusion overnight Patient really unable to answer questions Wife at bedside  EXAM:  BP (!) 154/68   Pulse 76   Temp 98.7 F (37.1 C) (Oral)   Resp 17   Ht 5\' 10"  (1.778 m)   Wt 66.7 kg   SpO2 99%   BMI 21.10 kg/m   Drowsy, easily awakened but drifts off to sleep Garbled speech Difficulties following even simple commands Incision: bandage in place, dry  IMPRESSION/PLAN 79 y.o. male admitted with bacteremia, sepsis 2wks after L4-5 microdiscectomy. POD2 lumbar wound wash out. Worsening encephalopathy. - CT head - Reconsult TH - Abx per ID

## 2019-07-04 NOTE — Progress Notes (Addendum)
Patient back in room from TEE procedure, report received from Tees Toh, [place].

## 2019-07-04 NOTE — Progress Notes (Signed)
Pt back on unit from MRI.

## 2019-07-04 NOTE — CV Procedure (Signed)
    Transesophageal Echocardiogram Note  Willliam Pettet [id number] November 10, 1940  Procedure: Transesophageal Echocardiogram Indications: bacteremia   Procedure Details Consent: Obtained Time Out: Verified patient identification, verified procedure, site/side was marked, verified correct patient position, special equipment/implants available, Radiology Safety Procedures followed,  medications/allergies/relevent history reviewed, required imaging and test results available.  Performed  Medications:  During this procedure the patient is administered a Propofol drip by Maralyn Sago CRNA.  Total of 144 mg propofol and Lidocaine 40 mg IV were given   Left Ventrical:  Moderate- severe LV dysfunction .  EF 30-35%.   Mitral Valve: mild MR.  No vegetation   Aortic Valve: 3 leaflet valve.  No AI or AS, no vegetation   Tricuspid Valve: normal tricuspid valve,  No vegetation.   Pulmonic Valve: normal , mild PI.  No vegetation   Left Atrium/ Left atrial appendage: no thrombi. There is a small pericardial effusion adjacent to the LAA   Atrial septum: mobile intra atrial septum.   Aorta: normal    Complications: No apparent complications Patient did tolerate procedure well.   Vesta Mixer, Montez Hageman., MD, [facility] 07/04/2019, 8:07 AM

## 2019-07-04 NOTE — Transfer of Care (Signed)
Immediate Anesthesia Transfer of Care Note  Patient: Hilda Wexler  Procedure(s) Performed: TRANSESOPHAGEAL ECHOCARDIOGRAM (TEE) (N/A )  Patient Location: Endoscopy Unit  Anesthesia Type:MAC  Level of Consciousness: drowsy  Airway & Oxygen Therapy: Patient Spontanous Breathing and Patient connected to nasal cannula oxygen  Post-op Assessment: Report given to RN, Post -op Vital signs reviewed and stable and Patient moving all extremities  Post vital signs: Reviewed and stable  Last Vitals:  Vitals Value Taken Time  BP    Temp    Pulse    Resp    SpO2      Last Pain:  Vitals:   07/04/19 0704  TempSrc: Oral  PainSc: 0-No pain      Patients Stated Pain Goal: 0 (84/72/07 2182)  Complications: No apparent anesthesia complications

## 2019-07-04 NOTE — Anesthesia Preprocedure Evaluation (Addendum)
Anesthesia Evaluation  Patient identified by MRN, date of birth, ID band Patient confused    Reviewed: Allergy & Precautions, NPO status , Patient's Chart, lab work & pertinent test results  Airway Mallampati: III  TM Distance: >3 FB Neck ROM: Full    Dental  (+) Poor Dentition   Pulmonary former smoker,    Pulmonary exam normal breath sounds clear to auscultation       Cardiovascular hypertension, Pt. on medications Normal cardiovascular exam Rhythm:Regular Rate:Normal  ECG: rate 81. Atrial fibrillation Left anterior fascicular block   Neuro/Psych PSYCHIATRIC DISORDERS negative neurological ROS     GI/Hepatic negative GI ROS, Neg liver ROS,   Endo/Other  negative endocrine ROS  Renal/GU negative Renal ROS     Musculoskeletal negative musculoskeletal ROS (+)   Abdominal   Peds  Hematology  (+) anemia ,   Anesthesia Other Findings BACTEREMIA  Reproductive/Obstetrics                            Anesthesia Physical Anesthesia Plan  ASA: III  Anesthesia Plan: MAC   Post-op Pain Management:    Induction: Intravenous  PONV Risk Score and Plan: 1 and Propofol infusion and Treatment may vary due to age or medical condition  Airway Management Planned: Nasal Cannula  Additional Equipment:   Intra-op Plan:   Post-operative Plan:   Informed Consent: I have reviewed the patients History and Physical, chart, labs and discussed the procedure including the risks, benefits and alternatives for the proposed anesthesia with the patient or authorized representative who has indicated his/her understanding and acceptance.     Dental advisory given and Consent reviewed with POA  Plan Discussed with: CRNA  Anesthesia Plan Comments: (Anesthetic plan discussed with wife, [name], via telephone. )      Anesthesia Quick Evaluation

## 2019-07-04 NOTE — Progress Notes (Signed)
ANTICOAGULATION CONSULT NOTE - Initial Consult  Pharmacy Consult for IV Heparin Indication: atrial fibrillation  No Known Allergies  Patient Measurements: Height: 5\' 10"  (177.8 cm) Weight: 66.7 kg (147 lb 0.8 oz) IBW/kg (Calculated) : 73 Heparin Dosing Weight: 66.7 kg  Vital Signs: Temp: 98.9 F (37.2 C) (05/28 1149) Temp Source: Oral (05/28 1149) BP: 148/74 (05/28 1149) Pulse Rate: 77 (05/28 1149)  Labs: Recent Labs    07/03/19 1159 07/04/19 0145 07/04/19 0926  HGB 10.2* 10.8*  --   HCT 30.0* 32.3*  --   PLT 179 189  --   HEPARINUNFRC  --  <0.10* <0.10*  CREATININE 0.88 1.00  --     Estimated Creatinine Clearance: 57.4 mL/min (by C-G formula based on SCr of 1 mg/dL).   Medical History: Past Medical History:  Diagnosis Date  . Hypertension     Assessment: 79 yr old male admitted on 06/30/19 with AMS and wound drainage following L4-5 microdiscectomy at outpt surgical center on 06/17/19; he was diagnosed with lumbar wound infection and MSSA bacteremia (S/P washout on 5/25).   Pt with new atrial fibrillation; pharmacy is consulted to dose heparin (no bolus, due to recent neurosurgery); will use lower heparin level goal (0.3-0.5 units/ml) as well, due to recent neurosurgery.  Pt is S/P TTE today: EF=50-55%; plan for TEE tomorrow (5/28)  HL undetectable, hgb/hct stable.  Goal of Therapy:  Heparin level: 0.3-0.5 units/ml  Monitor platelets by anticoagulation protocol: Yes   Plan:  Increase Heparin infusion to 1400 units/hr (no bolus, due to recent neurosurgery) Check 6 - 8 hr heparin level Monitor daily heparin level, CBC Monitor for signs/symptoms of bleeding  07-04-1986, PharmD, [facility] Clinical Pharmacist Please see AMION for all Pharmacists' Contact Phone Numbers 07/04/2019, 12:41 PM

## 2019-07-04 NOTE — Progress Notes (Addendum)
PROGRESS NOTE/ Consult    Neil Cordova  EXB:[id number] DOB: 1940/11/08 DOA: 06/30/2019 PCP: Patient, No Pcp Per    Chief Complaint  Patient presents with  . Fever  . Altered Mental Status    Brief Narrative:  Patient was initially consulted on May 24 for assistance in medical management.  At that point patient had a postoperative wound infection, status post left L4-L5 microdissection on 5/11.  He also had metabolic encephalopathy, hypotension, hyponatremia and acute kidney injury.  Recommendations were to continue close neurologic monitoring, continue intravenous antibiotics and IV fluids.  His blood cultures grew MSSA and patient was taken to the OR on 5/25 for washout, finding a deep surgical site infection that did not track into the epidural space.  Infectious disease was consulted, recommendations to change antibiotic therapy to cefazolin and obtain further cardiac images to rule out endocarditis.  His echocardiogram performed today had no evidence of valvular vegetations, and the plan is to obtain a transesophageal echocardiogram in the morning.  Patient continued to be encephalopathic and not back to baseline, TRH was reconsulted for further recommendations.   Assessment & Plan:   Principal Problem:   Wound infection after surgery Active Problems:   Hyponatremia   Pressure injury of skin   Acute metabolic encephalopathy   Delirium   Atrial flutter (HCC)   Acute combined systolic and diastolic congestive heart failure (HCC)   Bacteremia   1 acute metabolic encephalopathy with delirium Patient initially noted to be lethargic and somnolent and woke up to pain and touch.  Patient also noted to be confused and disoriented.  Patient improved clinically.  Patient alert and oriented to self place and time.  Somewhat drowsy however just returned from TEE.  Ammonia level at 9.  Head CT negative.  Patient noted to be on new onset atrial flutter and thus/concern for possible  CVA.  Patient with some dysarthric speech, moving extremities spontaneously.  Improving clinically and slowly.  Check MRI head to rule out acute CVA.  Continue supportive care, IV fluids, IV antibiotics.  Alprazolam has been discontinued and patient placed on Haldol as needed for agitation.  Continue supportive care, thiamine, multivitamin. follow.  2.  New onset atrial flutter Patient noted to be in new onset atrial flutter by EKG however currently rate controlled on current regimen of metoprolol twice daily.  CHA2DS2-VASc score is 2 due to age and hypertension.  Currently on IV heparin.  2D echo obtained with EF of 50 to 55%, no wall motion abnormalities, left atrial size mild to moderately dilated, mild mitral valve regurgitation.  Patient underwent TEE this morning which was negative for vegetation, EF of 30 to 35%, moderate to severe LV dysfunction.  Check a TSH.  As this is new onset atrial flutter will consult with cardiology for further evaluation and management.  3.  Cardiomyopathy 2D echo obtained with EF of 50 to 55%, no wall motion abnormalities, left atrial size mild to moderately dilated, mild mitral valve regurgitation.  Patient underwent TEE this morning which was negative for vegetation, EF of 30 to 35%, moderate to severe LV dysfunction.  Continue beta-blocker.  Cardiology consultation.  4.  Surgical wound infection/MSSA bacteremia Patient noted to have some encephalopathy however seems to be improving.  Patient with new onset atrial flutter and as such due to worsening encephalopathy and bacteremia will check MRI brain.  Patient underwent TEE on 07/04/2019 with EF of 30 to 35%, mild MR, no vegetations noted.  MRI L-spine pending.  Continue  current IV antibiotics as directed by ID.  Patient now on IV nafcillin.  Per ID.  5.  Acute kidney injury with hyponatremia Likely secondary to prerenal azotemia.  Resolved with hydration.  Follow.  6.  Stage II pressure ulcer on the coccyx,  POA Continue current wound care.   DVT prophylaxis: Heparin Code Status: Full Family Communication: Updated patient.  No family at bedside. Disposition:   Status is: Inpatient    Dispo: The patient is from: Home              Anticipated d/c is to: Per primary team.              Anticipated d/c date is: Per primary team.              Patient currently in atrial flutter however rate controlled, cardiology consultation pending, currently on IV antibiotics for bacteremia.        Consultants:   Cardiology pending  ID: Dr. Drue Second 07/01/2019  [facility] hospitalist: Dr. Katrinka Blazing 06/30/2019  Procedures:   MRI brain pending  MRI L-spine pending  TEE 07/04/2019  2D echo 07/03/2019  CT head without contrast 07/03/2019  Chest x-ray 06/30/2019  Antimicrobials:   IV Ancef 07/01/2019>>>>> 07/03/2019  IV cefepime 06/30/2019>>>> 07/01/2019  IV nafcillin 07/03/2019 >>>>   Subjective: Patient just returned from TEE.  Patient with some dysarthric speech.  Patient alert and oriented to self place and time.  Denies any significant chest pain.  Denies any significant shortness of breath.  Still with complaints of back pain. Telemetry consistent with atrial flutter.  Objective: Vitals:   07/04/19 0400 07/04/19 0704 07/04/19 0814 07/04/19 0820  BP:  (!) 154/68 (!) 154/56 (!) 143/52  Pulse: 65 76 77 77  Resp: 13 17 (!) 26 15  Temp:  98.7 F (37.1 C) 98.1 F (36.7 C)   TempSrc:  Oral Temporal   SpO2: 95% 99% 98% 97%  Weight:  66.7 kg    Height:   (1.778 m)      Intake/Output Summary (Last 24 hours) at 07/04/2019 0907 Last data filed at 07/04/2019 0815 Gross per 24 hour  Intake 2147.52 ml  Output 2400 ml  Net -252.48 ml   Filed Weights   06/30/19 1400 07/04/19 0704  Weight: 66.7 kg 66.7 kg    Examination:  General exam: Appears calm and comfortable  Respiratory system: Clear to auscultation anterior lung fields.Marland Kitchen Respiratory effort normal. Cardiovascular system: S1 & S2  heard, RRR. No JVD, murmurs, rubs, gallops or clicks. No pedal edema. Gastrointestinal system: Abdomen is nondistended, soft and nontender. No organomegaly or masses felt. Normal bowel sounds heard. Central nervous system: Alert and oriented.  Dysarthric speech.  No focal neurological deficits. Extremities: Symmetric 5 x 5 power. Skin: No rashes, lesions or ulcers Psychiatry: Judgement and insight appear normal. Mood & affect appropriate.     Data Reviewed: I have personally reviewed following labs and imaging studies  CBC: Recent Labs  Lab 06/30/19 1055 07/03/19 1159 07/04/19 0145  WBC 7.4 13.3* 14.0*  NEUTROABS 6.5 12.1* 12.0*  HGB 10.1* 10.2* 10.8*  HCT 30.4* 30.0* 32.3*  MCV 90.2 89.8 90.0  PLT 166 179 189    Basic Metabolic Panel: Recent Labs  Lab 06/30/19 1055 07/03/19 1159 07/04/19 0145  NA 127* 133* 136  K 4.6 4.1 3.7  CL 93* 98 102  CO2 GLUCOSE 153* 155* 181*  BUN 41* 23 24*  CREATININE 1.44* 0.88 1.00  CALCIUM 8.8*  8.3* 8.3*  MG  --  1.8  --     GFR: Estimated Creatinine Clearance: 57.4 mL/min (by C-G formula based on SCr of 1 mg/dL).  Liver Function Tests: Recent Labs  Lab 06/30/19 1055 07/03/19 1159  AST 29 42*  ALT 23 25  ALKPHOS 76 102  BILITOT 1.1 1.1  PROT 5.3* 5.0*  ALBUMIN 2.3* 1.9*    CBG: Recent Labs  Lab 07/01/19 1014 07/03/19 1948  GLUCAP 147* 154*     Recent Results (from the past 240 hour(s))  Blood Culture (routine x 2)     Status: Abnormal   Collection Time: 06/30/19 10:55 AM   Specimen: BLOOD RIGHT FOREARM  Result Value Ref Range Status   Specimen Description BLOOD RIGHT FOREARM  Final   Special Requests   Final    BOTTLES DRAWN AEROBIC AND ANAEROBIC Blood Culture adequate volume   Culture  Setup Time   Final    GRAM POSITIVE COCCI IN CLUSTERS IN BOTH AEROBIC AND ANAEROBIC BOTTLES CRITICAL RESULT CALLED TO, READ BACK BY AND VERIFIED WITH: K. AMEND,PHARMD 0210 07/01/2019 T. TYSOR Performed at [facility], [place] [address], [place], [place] [address]    Culture STAPHYLOCOCCUS AUREUS (A)  Final   Report Status 07/03/2019 FINAL  Final   Organism ID, Bacteria STAPHYLOCOCCUS AUREUS  Final      Susceptibility   Staphylococcus aureus - MIC*    CIPROFLOXACIN <=0.5 SENSITIVE Sensitive     ERYTHROMYCIN RESISTANT Resistant     GENTAMICIN <=0.5 SENSITIVE Sensitive     OXACILLIN 0.5 SENSITIVE Sensitive     TETRACYCLINE <=1 SENSITIVE Sensitive     VANCOMYCIN 1 SENSITIVE Sensitive     TRIMETH/SULFA <=10 SENSITIVE Sensitive     CLINDAMYCIN RESISTANT Resistant     RIFAMPIN <=0.5 SENSITIVE Sensitive     Inducible Clindamycin POSITIVE Resistant     * STAPHYLOCOCCUS AUREUS  Urine culture     Status: Abnormal   Collection Time: 06/30/19 10:55 AM   Specimen: In/Out Cath Urine  Result Value Ref Range Status   Specimen Description IN/OUT CATH URINE  Final   Special Requests   Final    NONE Performed at [facility], [place] [address], [place], [place] [address]    Culture 60,000 COLONIES/mL STAPHYLOCOCCUS EPIDERMIDIS (A)  Final   Report Status 07/02/2019 FINAL  Final   Organism ID, Bacteria STAPHYLOCOCCUS EPIDERMIDIS (A)  Final      Susceptibility   Staphylococcus epidermidis - MIC*    CIPROFLOXACIN <=0.5 SENSITIVE Sensitive     GENTAMICIN <=0.5 SENSITIVE Sensitive     NITROFURANTOIN <=16 SENSITIVE Sensitive     OXACILLIN >=4 RESISTANT Resistant     TETRACYCLINE 2 SENSITIVE Sensitive     VANCOMYCIN 2 SENSITIVE Sensitive     TRIMETH/SULFA <=10 SENSITIVE Sensitive     CLINDAMYCIN <=0.25 SENSITIVE Sensitive     RIFAMPIN <=0.5 SENSITIVE Sensitive     Inducible Clindamycin NEGATIVE Sensitive     * 60,000 COLONIES/mL STAPHYLOCOCCUS EPIDERMIDIS  Blood Culture ID Panel (Reflexed)     Status: Abnormal   Collection Time: 06/30/19 10:55 AM  Result Value Ref Range Status   Enterococcus species NOT DETECTED NOT DETECTED Final   Listeria monocytogenes NOT DETECTED NOT DETECTED Final    Staphylococcus species DETECTED (A) NOT DETECTED Final    Comment: CRITICAL RESULT CALLED TO, READ BACK BY AND VERIFIED WITH: K. AMEND,PHARMD 0210 07/01/2019 T. TYSOR    Staphylococcus aureus (BCID) DETECTED (A) NOT DETECTED Final  Comment: Methicillin (oxacillin) susceptible Staphylococcus aureus (MSSA). Preferred therapy is anti staphylococcal beta lactam antibiotic (Cefazolin or Nafcillin), unless clinically contraindicated. CRITICAL RESULT CALLED TO, READ BACK BY AND VERIFIED WITH: K. AMEND,PHARMD 0210 07/01/2019 T. TYSOR    Methicillin resistance NOT DETECTED NOT DETECTED Final   Streptococcus species NOT DETECTED NOT DETECTED Final   Streptococcus agalactiae NOT DETECTED NOT DETECTED Final   Streptococcus pneumoniae NOT DETECTED NOT DETECTED Final   Streptococcus pyogenes NOT DETECTED NOT DETECTED Final   Acinetobacter baumannii NOT DETECTED NOT DETECTED Final   Enterobacteriaceae species NOT DETECTED NOT DETECTED Final   Enterobacter cloacae complex NOT DETECTED NOT DETECTED Final   Escherichia coli NOT DETECTED NOT DETECTED Final   Klebsiella oxytoca NOT DETECTED NOT DETECTED Final   Klebsiella pneumoniae NOT DETECTED NOT DETECTED Final   Proteus species NOT DETECTED NOT DETECTED Final   Serratia marcescens NOT DETECTED NOT DETECTED Final   Haemophilus influenzae NOT DETECTED NOT DETECTED Final   Neisseria meningitidis NOT DETECTED NOT DETECTED Final   Pseudomonas aeruginosa NOT DETECTED NOT DETECTED Final   Candida albicans NOT DETECTED NOT DETECTED Final   Candida glabrata NOT DETECTED NOT DETECTED Final   Candida krusei NOT DETECTED NOT DETECTED Final   Candida parapsilosis NOT DETECTED NOT DETECTED Final   Candida tropicalis NOT DETECTED NOT DETECTED Final    Comment: Performed at [facility], [address], [place], [place] [address]  SARS Coronavirus 2 by RT PCR (hospital order, performed in [facility] hospital lab) Nasopharyngeal Nasopharyngeal Swab      Status: None   Collection Time: 06/30/19 11:49 AM   Specimen: Nasopharyngeal Swab  Result Value Ref Range Status   SARS Coronavirus 2 NEGATIVE NEGATIVE Final    Comment: (NOTE) SARS-CoV-2 target nucleic acids are NOT DETECTED. The SARS-CoV-2 RNA is generally detectable in upper and lower respiratory specimens during the acute phase of infection. The lowest concentration of SARS-CoV-2 viral copies this assay can detect is 250 copies / mL. A negative result does not preclude SARS-CoV-2 infection and should not be used as the sole basis for treatment or other patient management decisions.  A negative result may occur with improper specimen collection / handling, submission of specimen other than nasopharyngeal swab, presence of viral mutation(s) within the areas targeted by this assay, and inadequate number of viral copies (<250 copies / mL). A negative result must be combined with clinical observations, patient history, and epidemiological information. Fact Sheet for Patients:   BoilerBrush.com.cy Fact Sheet for Healthcare Providers: https://pope.com/ This test is not yet approved or cleared  by the [place] FDA and has been authorized for detection and/or diagnosis of SARS-CoV-2 by FDA under an Emergency Use Authorization (EUA).  This EUA will remain in effect (meaning this test can be used) for the duration of the COVID-19 declaration under Section 564(b)(1) of the Act, 21 U.S.C. section 360bbb-3(b)(1), unless the authorization is terminated or revoked sooner. Performed at [facility], [address] [place], [place] [address]   Blood Culture (routine x 2)     Status: Abnormal   Collection Time: 06/30/19 12:00 PM   Specimen: BLOOD RIGHT WRIST  Result Value Ref Range Status   Specimen Description BLOOD RIGHT WRIST  Final   Special Requests   Final    BOTTLES DRAWN AEROBIC AND ANAEROBIC Blood Culture results may not be  optimal due to an inadequate volume of blood received in culture bottles   Culture  Setup Time   Final  GRAM POSITIVE COCCI IN CLUSTERS IN BOTH AEROBIC AND ANAEROBIC BOTTLES CRITICAL RESULT CALLED TO, READ BACK BY AND VERIFIED WITH: K. AMEND,PHARMD 0210 07/01/2019 T. TYSOR    Culture (A)  Final    STAPHYLOCOCCUS AUREUS SUSCEPTIBILITIES PERFORMED ON PREVIOUS CULTURE WITHIN THE LAST 5 DAYS. Performed at [facility], [address] [place], [place] [address]    Report Status 07/03/2019 FINAL  Final  Aerobic/Anaerobic Culture (surgical/deep wound)     Status: None (Preliminary result)   Collection Time: 06/30/19  3:42 PM   Specimen: Wound  Result Value Ref Range Status   Specimen Description WOUND SITE NOT SPECIFIED  Final   Special Requests NO ANA SWAB  Final   Gram Stain   Final    NO WBC SEEN MODERATE GRAM POSITIVE COCCI IN PAIRS IN CLUSTERS    Culture ABUNDANT STAPHYLOCOCCUS AUREUS  Final   Report Status PENDING  Incomplete   Organism ID, Bacteria STAPHYLOCOCCUS AUREUS  Final      Susceptibility   Staphylococcus aureus - MIC*    CIPROFLOXACIN <=0.5 SENSITIVE Sensitive     ERYTHROMYCIN RESISTANT Resistant     GENTAMICIN <=0.5 SENSITIVE Sensitive     OXACILLIN <=0.25 SENSITIVE Sensitive     TETRACYCLINE <=1 SENSITIVE Sensitive     VANCOMYCIN <=0.5 SENSITIVE Sensitive     TRIMETH/SULFA <=10 SENSITIVE Sensitive     CLINDAMYCIN RESISTANT Resistant     RIFAMPIN <=0.5 SENSITIVE Sensitive     Inducible Clindamycin Value in next row Resistant      POSITIVEPerformed at [facility], [address], [place], [place] [address]    * ABUNDANT STAPHYLOCOCCUS AUREUS  MRSA PCR Screening     Status: None   Collection Time: 06/30/19 10:27 PM   Specimen: Nasopharyngeal  Result Value Ref Range Status   MRSA by PCR NEGATIVE NEGATIVE Final    Comment:        The GeneXpert MRSA Assay (FDA approved for NASAL specimens only), is one component of a comprehensive MRSA  colonization surveillance program. It is not intended to diagnose MRSA infection nor to guide or monitor treatment for MRSA infections. Performed at [facility], [address], [place], [place] [address]   Aerobic/Anaerobic Culture (surgical/deep wound)     Status: None (Preliminary result)   Collection Time: 07/01/19 11:54 AM   Specimen: Wound  Result Value Ref Range Status   Specimen Description WOUND  Final   Special Requests NONE  Final   Gram Stain   Final    FEW WBC PRESENT, PREDOMINANTLY PMN ABUNDANT GRAM POSITIVE COCCI Performed at [facility], [address], [place], [place] [address]    Culture   Final    ABUNDANT STAPHYLOCOCCUS AUREUS NO ANAEROBES ISOLATED; CULTURE IN PROGRESS FOR 5 DAYS    Report Status PENDING  Incomplete   Organism ID, Bacteria STAPHYLOCOCCUS AUREUS  Final      Susceptibility   Staphylococcus aureus - MIC*    CIPROFLOXACIN <=0.5 SENSITIVE Sensitive     ERYTHROMYCIN RESISTANT Resistant     GENTAMICIN <=0.5 SENSITIVE Sensitive     OXACILLIN 0.5 SENSITIVE Sensitive     TETRACYCLINE <=1 SENSITIVE Sensitive     VANCOMYCIN 1 SENSITIVE Sensitive     TRIMETH/SULFA <=10 SENSITIVE Sensitive     CLINDAMYCIN RESISTANT Resistant     RIFAMPIN <=0.5 SENSITIVE Sensitive     Inducible Clindamycin POSITIVE Resistant     * ABUNDANT STAPHYLOCOCCUS AUREUS  Culture, blood (routine x 2)  Status: None (Preliminary result)   Collection Time: 07/02/19 12:06 PM   Specimen: BLOOD  Result Value Ref Range Status   Specimen Description BLOOD RIGHT ANTECUBITAL  Final   Special Requests   Final    BOTTLES DRAWN AEROBIC AND ANAEROBIC Blood Culture adequate volume   Culture   Final    NO GROWTH 2 DAYS Performed at [facility], [address] [place], [place] [address]    Report Status PENDING  Incomplete  Culture, blood (routine x 2)     Status: None (Preliminary result)   Collection Time: 07/02/19 12:11 PM   Specimen: BLOOD RIGHT HAND   Result Value Ref Range Status   Specimen Description BLOOD RIGHT HAND  Final   Special Requests   Final    BOTTLES DRAWN AEROBIC AND ANAEROBIC Blood Culture adequate volume   Culture   Final    NO GROWTH 2 DAYS Performed at [facility], [address] [place], [place] [address]    Report Status PENDING  Incomplete  Culture, blood (routine x 2)     Status: None (Preliminary result)   Collection Time: 07/03/19  2:33 PM   Specimen: BLOOD RIGHT HAND  Result Value Ref Range Status   Specimen Description BLOOD RIGHT HAND  Final   Special Requests   Final    BOTTLES DRAWN AEROBIC AND ANAEROBIC Blood Culture adequate volume   Culture   Final    NO GROWTH < 24 HOURS Performed at [facility], [address] [place], [place] [address]    Report Status PENDING  Incomplete  Culture, blood (routine x 2)     Status: None (Preliminary result)   Collection Time: 07/03/19  2:37 PM   Specimen: BLOOD RIGHT ARM  Result Value Ref Range Status   Specimen Description BLOOD RIGHT ARM  Final   Special Requests   Final    BOTTLES DRAWN AEROBIC AND ANAEROBIC Blood Culture results may not be optimal due to an excessive volume of blood received in culture bottles   Culture   Final    NO GROWTH < 24 HOURS Performed at [facility], [address] [place], [place] [address]    Report Status PENDING  Incomplete         Radiology Studies: CT HEAD WO CONTRAST  Result Date: 07/03/2019 CLINICAL DATA:  Altered mental status. EXAM: CT HEAD WITHOUT CONTRAST TECHNIQUE: Contiguous axial images were obtained from the base of the skull through the vertex without intravenous contrast. COMPARISON:  None. FINDINGS: Brain: No evidence of acute infarction, hemorrhage, hydrocephalus, extra-axial collection or mass lesion/mass effect. Vascular: No hyperdense vessel or unexpected calcification. Skull: Normal. Negative for fracture or focal lesion. Sinuses/Orbits: No acute finding. Other: None.  IMPRESSION: Normal head CT. Electronically Signed   By: Lupita RaiderJames  Green Jr M.D.   On: 07/03/2019 20:28   ECHOCARDIOGRAM COMPLETE  Result Date: 07/03/2019    ECHOCARDIOGRAM REPORT   Patient [facility]   [facility] Date of Exam: 07/03/2019 Medical Rec #:  [id number]     Height:       70.0 in Accession #:    [id number]    Weight:       147.0 lb Date of Birth:  04/06/1940     BSA:          1.832 m Patient Age:    79 years      BP:           150/98 mmHg  Patient Gender: M             HR:           77 bpm. Exam Location:  Inpatient Procedure: 2D Echo Indications:    Bacteremia 790.7 / R78.81  History:        Patient has no prior history of Echocardiogram examinations.                 Wound infection after surgery.  Sonographer:    Leeroy Bock Turrentine Referring Phys: [id number] Marlane Hatcher [id number] IMPRESSIONS  1. Left ventricular ejection fraction, by estimation, is 50 to 55%. The left ventricle has low normal function. The left ventricle has no regional wall motion abnormalities. Left ventricular diastolic parameters are indeterminate.  2. Right ventricular systolic function is normal. The right ventricular size is normal.  3. Left atrial size was mild to moderately dilated.  4. The mitral valve is grossly normal. Mild mitral valve regurgitation.  5. The aortic valve is tricuspid. Aortic valve regurgitation is not visualized.  6. The inferior vena cava is dilated in size with <50% respiratory variability, suggesting right atrial pressure of 15 mmHg. Conclusion(s)/Recommendation(s): No evidence of valvular vegetations on this transthoracic echocardiogram. Would recommend a transesophageal echocardiogram to exclude infective endocarditis if clinically indicated. FINDINGS  Left Ventricle: Left ventricular ejection fraction, by estimation, is 50 to 55%. The left ventricle has low normal function. The left ventricle has no regional wall motion abnormalities. The left ventricular internal cavity size was normal in size. There is no left  ventricular hypertrophy. Left ventricular diastolic parameters are indeterminate. Right Ventricle: The right ventricular size is normal. No increase in right ventricular wall thickness. Right ventricular systolic function is normal. Left Atrium: Left atrial size was mild to moderately dilated. Right Atrium: Right atrial size was normal in size. Pericardium: There is no evidence of pericardial effusion. Mitral Valve: The mitral valve is grossly normal. Mild mitral valve regurgitation. Tricuspid Valve: The tricuspid valve is grossly normal. Tricuspid valve regurgitation is trivial. Aortic Valve: The aortic valve is tricuspid. Aortic valve regurgitation is not visualized. Pulmonic Valve: The pulmonic valve was normal in structure. Pulmonic valve regurgitation is not visualized. Aorta: The aortic root and ascending aorta are structurally normal, with no evidence of dilitation. Venous: The inferior vena cava is dilated in size with less than 50% respiratory variability, suggesting right atrial pressure of 15 mmHg. IAS/Shunts: No atrial level shunt detected by color flow Doppler.  LEFT VENTRICLE PLAX 2D LVIDd:         4.90 cm  Diastology LVIDs:         3.50 cm  LV e' lateral:   18.00 cm/s LV PW:         0.70 cm  LV E/e' lateral: 7.6 LV IVS:        0.70 cm  LV e' medial:    12.00 cm/s LVOT diam:     2.00 cm  LV E/e' medial:  11.4 LV SV:         81 LV SV Index:   44 LVOT Area:     3.14 cm  RIGHT VENTRICLE TAPSE (M-mode): 1.7 cm LEFT ATRIUM             Index       RIGHT ATRIUM           Index LA diam:        4.60 cm 2.51 cm/m  RA Area:     17.40 cm LA Vol (A2C):  79.1 ml 43.19 ml/m RA Volume:   43.10 ml  23.53 ml/m LA Vol (A4C):   61.3 ml 33.47 ml/m LA Biplane Vol: 70.5 ml 38.49 ml/m  AORTIC VALVE LVOT Vmax:   167.00 cm/s LVOT Vmean:  112.000 cm/s LVOT VTI:    0.259 m  AORTA Ao Root diam: 3.40 cm MITRAL VALVE MV Area (PHT): 6.65 cm     SHUNTS MV Decel Time: 114 msec     Systemic VTI:  0.26 m MV E velocity: 137.00  cm/s  Systemic Diam: 2.00 cm MV A velocity: 96.10 cm/s MV E/A ratio:  1.43 Zoila Shutter MD Electronically signed by Zoila Shutter MD Signature Date/Time: 07/03/2019/3:16:13 PM    Final         Scheduled Meds: . docusate sodium  100 mg Oral BID  . metoprolol tartrate  25 mg Oral BID  . multivitamin  1 tablet Oral Daily  . PARoxetine  30 mg Oral QPM  . sodium chloride flush  3 mL Intravenous Q12H  . thiamine injection  100 mg Intravenous Daily   Continuous Infusions: . sodium chloride Stopped (07/04/19 0815)  . dextrose 5% lactated ringers 75 mL/hr at 07/04/19 0400  . heparin 1,200 Units/hr (07/04/19 0746)  . nafcillin (NAFCIL) continuous infusion       LOS: 4 days    Time spent: 40 minutes    Ramiro Harvest, MD [facility]   To contact the attending provider between 7A-7P or the covering provider during after hours 7P-7A, please log into the web site [email or web address] and access using universal [place] password for that web site. If you do not have the password, please call the hospital operator.  07/04/2019, 9:07 AM

## 2019-07-04 NOTE — Progress Notes (Signed)
Patient off unit to MRI via bed.

## 2019-07-04 NOTE — Progress Notes (Addendum)
ANTICOAGULATION CONSULT NOTE - Follow Up Consult  Pharmacy Consult for IV Heparin Indication: atrial fibrillation  No Known Allergies  Patient Measurements: Height: 5\' 10"  (177.8 cm) Weight: 66.7 kg (147 lb 0.8 oz) IBW/kg (Calculated) : 73 Heparin Dosing Weight: 66.7 kg  Vital Signs: Temp: 98.8 F (37.1 C) (05/28 1955) Temp Source: Oral (05/28 1955) BP: 135/67 (05/28 1955) Pulse Rate: 62 (05/28 1955)  Labs: Recent Labs    07/03/19 1159 07/04/19 0145 07/04/19 0926 07/04/19 2008  HGB 10.2* 10.8*  --   --   HCT 30.0* 32.3*  --   --   PLT 179 189  --   --   HEPARINUNFRC  --  <0.10* <0.10* 0.12*  CREATININE 0.88 1.00  --   --     Estimated Creatinine Clearance: 57.4 mL/min (by C-G formula based on SCr of 1 mg/dL).   Medical History: Past Medical History:  Diagnosis Date  . Hypertension     Assessment: 79 yr old male admitted on 06/30/19 with AMS and wound drainage following L4-5 microdiscectomy at outpt surgical center on 06/17/19; he was diagnosed with lumbar wound infection and MSSA bacteremia (S/P washout on 5/25).   Pt with new atrial fibrillation; pharmacy was consulted to dose heparin (no bolus, due to recent neurosurgery); will use lower heparin level goal (0.3-0.5 units/ml) as well, due to recent neurosurgery.  Pt is S/P TTE yesterday and TEE today, per Cardiology note, EF ~ 45%.  Heparin level ~7 hrs after heparin infusion was increased to 1400 units/hr was 0.12 units/ml, which is below the goal range for this pt. H/H, platelets stable. Per RN, no issues with IV or bleeding observed.  Goal of Therapy:  Heparin level: 0.3-0.5 units/ml  Monitor platelets by anticoagulation protocol: Yes   Plan:  Increase Heparin infusion to 1550 units/hr (no bolus, due to recent neurosurgery) Check 6 - 8 hr heparin level Monitor daily heparin level, CBC Monitor for signs/symptoms of bleeding  6/25, PharmD, BCPS, [facility] Clinical Pharmacist 07/04/2019, 8:38 PM

## 2019-07-05 ENCOUNTER — Inpatient Hospital Stay (HOSPITAL_COMMUNITY): Payer: Medicare Other

## 2019-07-05 DIAGNOSIS — R7881 Bacteremia: Secondary | ICD-10-CM

## 2019-07-05 DIAGNOSIS — R6510 Systemic inflammatory response syndrome (SIRS) of non-infectious origin without acute organ dysfunction: Secondary | ICD-10-CM

## 2019-07-05 DIAGNOSIS — G062 Extradural and subdural abscess, unspecified: Secondary | ICD-10-CM

## 2019-07-05 DIAGNOSIS — L0291 Cutaneous abscess, unspecified: Secondary | ICD-10-CM

## 2019-07-05 DIAGNOSIS — I5041 Acute combined systolic (congestive) and diastolic (congestive) heart failure: Secondary | ICD-10-CM

## 2019-07-05 DIAGNOSIS — B9561 Methicillin susceptible Staphylococcus aureus infection as the cause of diseases classified elsewhere: Secondary | ICD-10-CM

## 2019-07-05 DIAGNOSIS — D649 Anemia, unspecified: Secondary | ICD-10-CM

## 2019-07-05 HISTORY — PX: IR US GUIDE BX ASP/DRAIN: IMG2392

## 2019-07-05 LAB — CBC WITH DIFFERENTIAL/PLATELET
Abs Immature Granulocytes: 0.19 10*3/uL — ABNORMAL HIGH (ref 0.00–0.07)
Basophils Absolute: 0 10*3/uL (ref 0.0–0.1)
Basophils Relative: 0 %
Eosinophils Absolute: 0.1 10*3/uL (ref 0.0–0.5)
Eosinophils Relative: 0 %
HCT: 26.1 % — ABNORMAL LOW (ref 39.0–52.0)
Hemoglobin: 8.9 g/dL — ABNORMAL LOW (ref 13.0–17.0)
Immature Granulocytes: 1 %
Lymphocytes Relative: 9 %
Lymphs Abs: 1.2 10*3/uL (ref 0.7–4.0)
MCH: 30.3 pg (ref 26.0–34.0)
MCHC: 34.1 g/dL (ref 30.0–36.0)
MCV: 88.8 fL (ref 80.0–100.0)
Monocytes Absolute: 0.8 10*3/uL (ref 0.1–1.0)
Monocytes Relative: 6 %
Neutro Abs: 11.3 10*3/uL — ABNORMAL HIGH (ref 1.7–7.7)
Neutrophils Relative %: 84 %
Platelets: 245 10*3/uL (ref 150–400)
RBC: 2.94 MIL/uL — ABNORMAL LOW (ref 4.22–5.81)
RDW: 14.6 % (ref 11.5–15.5)
WBC: 13.5 10*3/uL — ABNORMAL HIGH (ref 4.0–10.5)
nRBC: 0 % (ref 0.0–0.2)

## 2019-07-05 LAB — BASIC METABOLIC PANEL
Anion gap: 8 (ref 5–15)
BUN: 26 mg/dL — ABNORMAL HIGH (ref 8–23)
CO2: 23 mmol/L (ref 22–32)
Calcium: 7.5 mg/dL — ABNORMAL LOW (ref 8.9–10.3)
Chloride: 101 mmol/L (ref 98–111)
Creatinine, Ser: 1.12 mg/dL (ref 0.61–1.24)
GFR calc Af Amer: >60 mL/min (ref >60)
GFR calc non Af Amer: >60 mL/min (ref >60)
Glucose, Bld: 204 mg/dL — ABNORMAL HIGH (ref 70–99)
Potassium: 3.4 mmol/L — ABNORMAL LOW (ref 3.5–5.1)
Sodium: 132 mmol/L — ABNORMAL LOW (ref 135–145)

## 2019-07-05 LAB — HEPARIN LEVEL (UNFRACTIONATED)
Heparin Unfractionated: 0.25 IU/mL — ABNORMAL LOW (ref 0.30–0.70)
Heparin Unfractionated: <0.1 IU/mL — ABNORMAL LOW (ref 0.30–0.70)

## 2019-07-05 IMAGING — US IR US GUIDANCE
1 series · 6 of 6 positions shown · non-contrast
Comparison: none

INDICATION: 79-year-old with a postoperative lumbar spine infection. Patient has
a large subcutaneous fluid collection on the left side of the back
and concerning for an abscess.

[Series 1: ir us guidance · 6 of 6 slices shown]
[im 1/6]
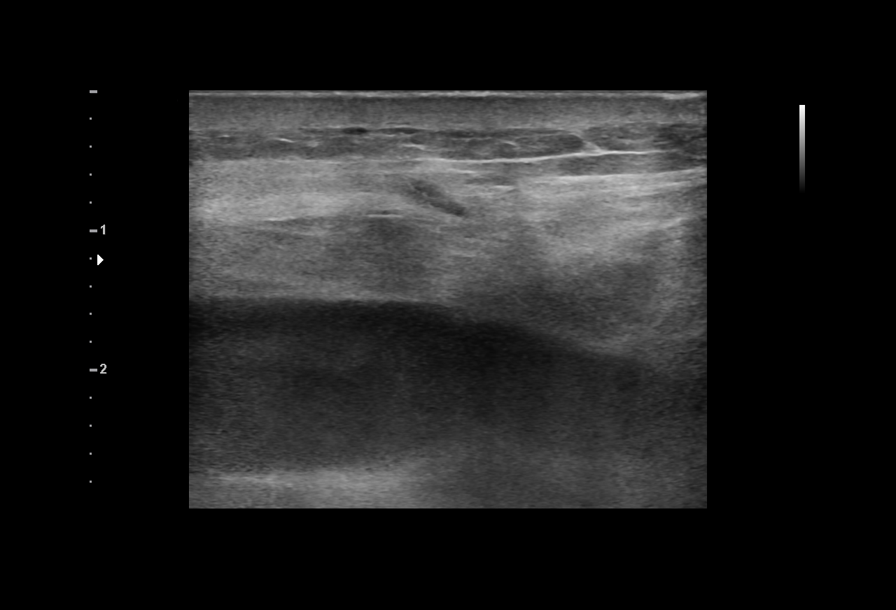
[im 2/6]
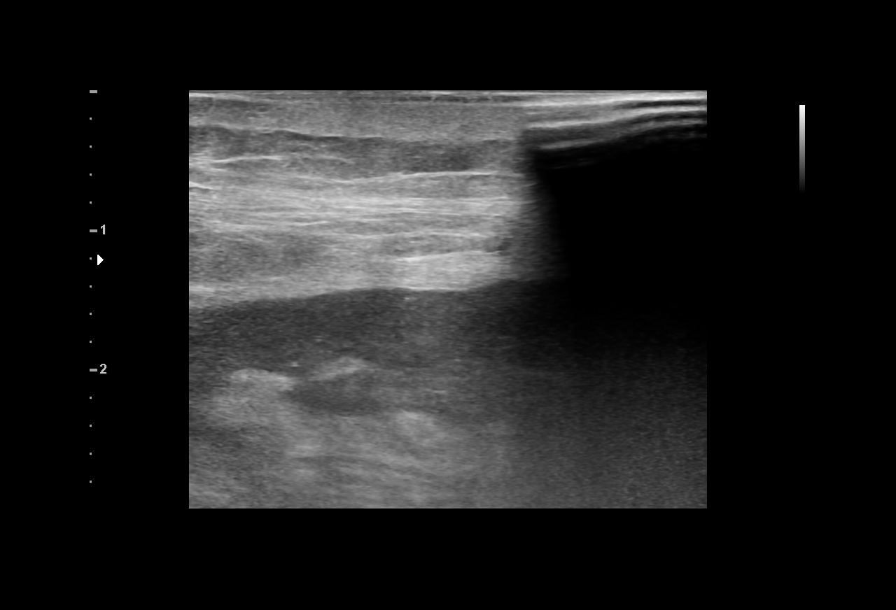
[im 3/6]
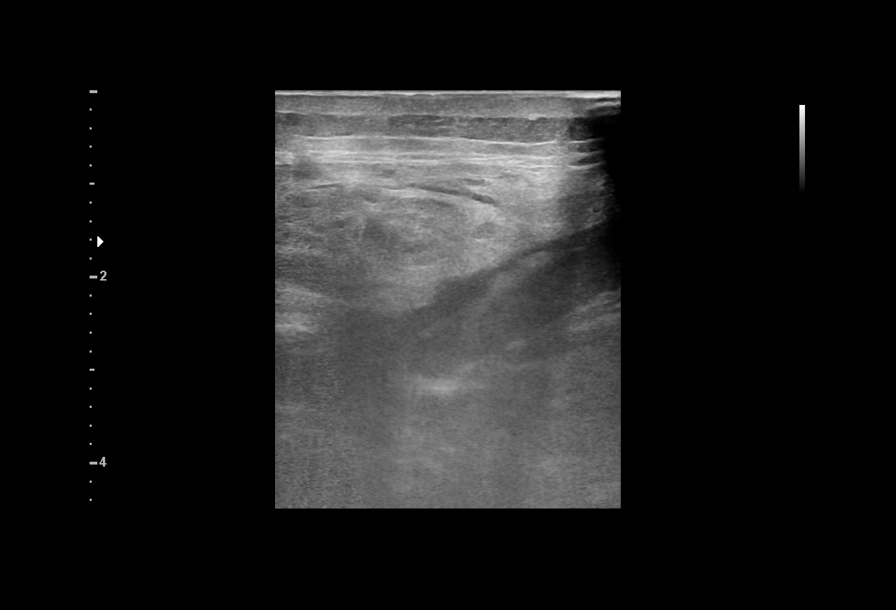
[im 4/6]
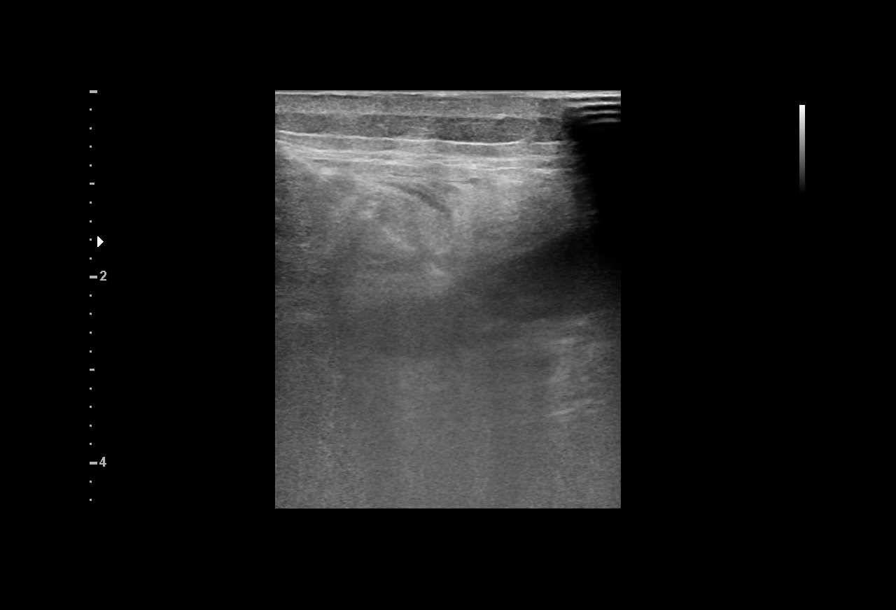
[im 5/6]
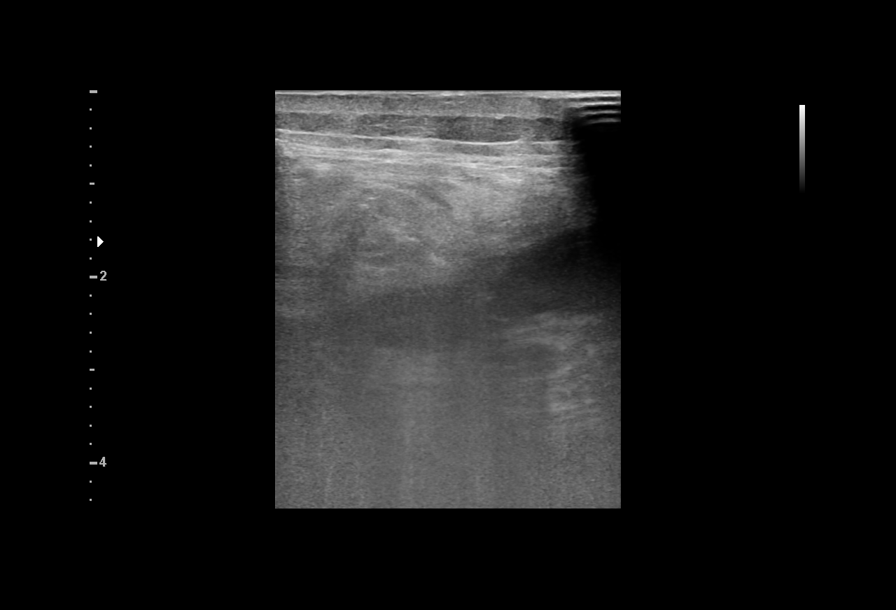
[im 6/6]
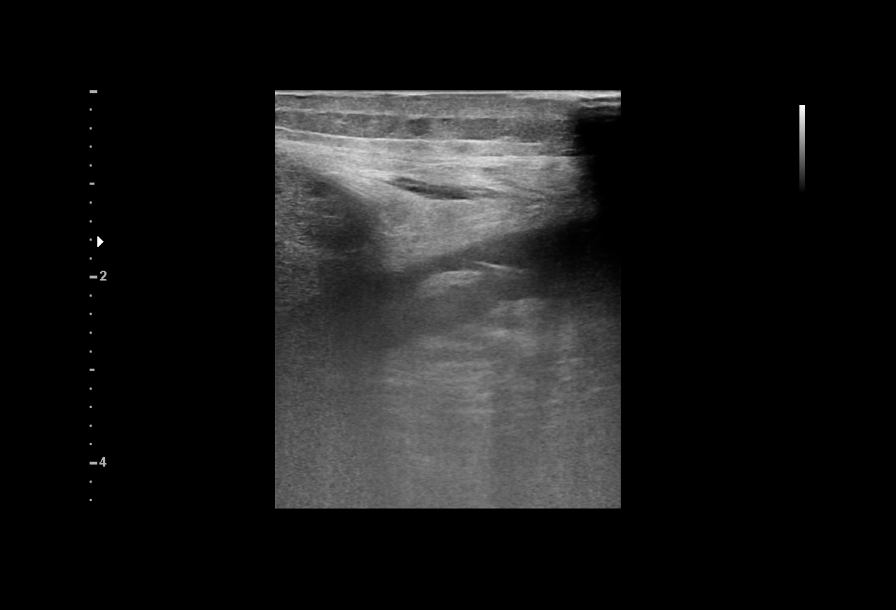

[6 of 6 positions shown; findings below may reference images not displayed]

EXAM:
ULTRASOUND-GUIDED PLACEMENT OF A DRAINAGE CATHETER WITHIN THE
SUBCUTANEOUS ABSCESS

MEDICATIONS:
None

ANESTHESIA/SEDATION:
None

COMPLICATIONS:
None immediate.

PROCEDURE:
Informed written consent was obtained from the patient after a
thorough discussion of the procedural risks, benefits and
alternatives. All questions were addressed. Maximal Sterile Barrier
Technique was utilized including caps, mask, sterile gowns, sterile
gloves, sterile drape, hand hygiene and skin antiseptic. A timeout
was performed prior to the initiation of the procedure.

Patient was placed prone. The left back subcutaneous tissues was
evaluated with ultrasound. Large hypoechoic fluid collection was
identified lateral to the wound dressings. Skin was prepped with
chlorhexidine and sterile field was created. Skin and soft tissues
were anesthetized with 1% lidocaine. Small incision was made. Using
ultrasound guidance, an 18 gauge needle was directed into the fluid
collection but no fluid could be aspirated. Therefore, the 18 gauge
needle was removed and a 5 French Yueh catheter was directed into
the fluid collection using ultrasound guidance. Again, no
significant fluid could be aspirated. Superstiff Amplatz wire was
easily advanced into the collection and confirmed to be within the
fluid with ultrasound. Tract was dilated to accommodate a 12 French
multipurpose drain. Approximately 22 mL of yellow purulent fluid was
then removed. Catheter was manipulated within the collection order
to improve drainage. Catheter was sutured to skin. Catheter was
flushed with sterile saline and attached to a suction bulb.
FINDINGS: Hypoechoic fluid collection in the left back subcutaneous tissues.
Findings correspond with the large fluid collection on recent MRI.
12 French drain was placed within the collection and 22 mL of
purulent fluid was removed.
IMPRESSION: Ultrasound-guided placement of a drainage catheter within the left
back subcutaneous abscess. Fluid was sent for culture.

## 2019-07-05 MED ORDER — IOHEXOL 300 MG/ML  SOLN: 50.0000 mL | Freq: Once | INTRAMUSCULAR | Status: DC | PRN

## 2019-07-05 MED ORDER — LIDOCAINE HCL 1 % IJ SOLN
INTRAMUSCULAR | Status: AC
  Filled 2019-07-05: qty 20

## 2019-07-05 MED ORDER — POTASSIUM CHLORIDE CRYS ER 20 MEQ PO TBCR
40.0000 meq | EXTENDED_RELEASE_TABLET | Freq: Once | ORAL | Status: AC
  Administered 2019-07-05: 40 meq via ORAL
  Filled 2019-07-05: qty 2

## 2019-07-05 MED ORDER — SODIUM CHLORIDE 0.9 % IV SOLN: INTRAVENOUS | Status: AC

## 2019-07-05 MED ORDER — LIDOCAINE HCL (PF) 1 % IJ SOLN
INTRAMUSCULAR | Status: DC | PRN
  Administered 2019-07-05: 10 mL

## 2019-07-05 NOTE — Progress Notes (Signed)
CSW attempted to meet with patient and family to review SNF process and gain consent. CSW notes patient was being transferred out of the room at this time. CSW will follow-up with patient when patient and family is available.

## 2019-07-05 NOTE — Anesthesia Postprocedure Evaluation (Signed)
Anesthesia Post Note  Patient: Leiam Hopwood  Procedure(s) Performed: TRANSESOPHAGEAL ECHOCARDIOGRAM (TEE) (N/A )     Patient location during evaluation: Endoscopy Anesthesia Type: MAC Level of consciousness: awake Pain management: pain level controlled Vital Signs Assessment: post-procedure vital signs reviewed and stable Respiratory status: spontaneous breathing, nonlabored ventilation, respiratory function stable and patient connected to nasal cannula oxygen Cardiovascular status: stable and blood pressure returned to baseline Postop Assessment: no apparent nausea or vomiting Anesthetic complications: no    Last Vitals:  Vitals:   07/04/19 1955 07/05/19 0300  BP: 135/67 119/62  Pulse: 62 72  Resp: (!) 22 (!) 24  Temp: 37.1 C 36.9 C  SpO2: 97% 99%    Last Pain:  Vitals:   07/05/19 0501  TempSrc:   PainSc: 0-No pain                 Nichol Ator P Makayela Secrest

## 2019-07-05 NOTE — Progress Notes (Signed)
Heparin drip re-started per order at previous rate of 16.59ml/hr.

## 2019-07-05 NOTE — Progress Notes (Signed)
ANTICOAGULATION CONSULT NOTE - Follow Up Consult  Pharmacy Consult for IV Heparin Indication: atrial fibrillation  No Known Allergies  Patient Measurements: Height: 5\' 10"  (177.8 cm) Weight: 66.7 kg (147 lb 0.8 oz) IBW/kg (Calculated) : 73 Heparin Dosing Weight: 66.7 kg  Vital Signs: Temp: 98.3 F (36.8 C) (05/29 1314) Temp Source: Oral (05/29 1314) BP: 124/74 (05/29 1314) Pulse Rate: 72 (05/29 1314)  Labs: Recent Labs    07/03/19 1159 07/03/19 1159 07/04/19 0145 07/04/19 0926 07/04/19 2008 07/05/19 0414 07/05/19 1403  HGB 10.2*   < > 10.8*  --   --  8.9*  --   HCT 30.0*  --  32.3*  --   --  26.1*  --   PLT 179  --  189  --   --  245  --   HEPARINUNFRC  --   --  <0.10*   < > 0.12* 0.25* <0.10*  CREATININE 0.88  --  1.00  --   --  1.12  --    < > = values in this interval not displayed.    Estimated Creatinine Clearance: 51.3 mL/min (by C-G formula based on SCr of 1.12 mg/dL).  Assessment: 79 yr old male admitted on 06/30/19 with AMS and wound drainage following L4-5 microdiscectomy at outpt surgical center on 06/17/19; he was diagnosed with lumbar wound infection and MSSA bacteremia (S/P washout on 5/25).   Pt with new atrial fibrillation; pharmacy was consulted to dose heparin (no bolus, due to recent neurosurgery); will use lower heparin level goal (0.3-0.5 units/ml) as well, due to recent neurosurgery.  Heparin level undetectable this afternoon on gtt at 1650 units/hr. No issues with line or bleeding reported per RN.  Goal of Therapy:  Heparin level: 0.3-0.5 units/ml  Monitor platelets by anticoagulation protocol: Yes   Plan:  Increase heparin infusion to 1680 units/hr Check 8 hr heparin level  6/25 PharmD., BCPS Clinical Pharmacist 07/05/2019 4:01 PM

## 2019-07-05 NOTE — Progress Notes (Addendum)
@  1118 Heparin dripped stopped, PIV flushed, for IR drain placement.  @1139  CHG wipes to back done. Pt voided prior to transport. Pt off unit in bed. Antibiotics remain infusing.

## 2019-07-05 NOTE — Progress Notes (Signed)
Subjective: No new complaints   Antibiotics:  Anti-infectives (From admission, onward)   Start     Dose/Rate Route Frequency Ordered Stop   07/04/19 1030  nafcillin 12 g in sodium chloride 0.9 % 500 mL continuous infusion     12 g 20.8 mL/hr over 24 Hours Intravenous Every 24 hours 07/04/19 0851     07/03/19 1600  nafcillin 2 g in sodium chloride 0.9 % 100 mL IVPB  Status:  Discontinued     2 g 200 mL/hr over 30 Minutes Intravenous Every 4 hours 07/03/19 1412 07/04/19 0851   07/01/19 1240  bacitracin 50,000 Units in sodium chloride 0.9 % 500 mL irrigation  Status:  Discontinued       As needed 07/01/19 1240 07/01/19 1245   07/01/19 1030  ceFAZolin (ANCEF) IVPB 2g/100 mL premix  Status:  Discontinued     2 g 200 mL/hr over 30 Minutes Intravenous Every 8 hours 07/01/19 0942 07/03/19 1412   07/01/19 0900  ceFEPIme (MAXIPIME) 2 g in sodium chloride 0.9 % 100 mL IVPB  Status:  Discontinued     2 g 200 mL/hr over 30 Minutes Intravenous Every 12 hours 07/01/19 0837 07/01/19 0942   06/30/19 1430  vancomycin (VANCOREADY) IVPB 1250 mg/250 mL  Status:  Discontinued     1,250 mg 166.7 mL/hr over 90 Minutes Intravenous Every 24 hours 06/30/19 1403 07/01/19 0942   06/30/19 1430  ceFEPIme (MAXIPIME) 2 g in sodium chloride 0.9 % 100 mL IVPB  Status:  Discontinued     2 g 200 mL/hr over 30 Minutes Intravenous Every 12 hours 06/30/19 1403 07/01/19 0837      Medications: Scheduled Meds: . docusate sodium  100 mg Oral BID  . lisinopril  10 mg Oral Daily  . metoprolol tartrate  25 mg Oral BID  . multivitamin  1 tablet Oral Daily  . PARoxetine  30 mg Oral QPM  . sodium chloride flush  3 mL Intravenous Q12H  . thiamine  100 mg Oral Daily   Continuous Infusions: . sodium chloride Stopped (07/04/19 0815)  . sodium chloride    . sodium chloride 75 mL/hr at 07/05/19 0857  . heparin 1,650 Units/hr (07/05/19 0602)  . nafcillin (NAFCIL) continuous infusion 12 g (07/04/19 1111)   PRN  Meds:.sodium chloride, acetaminophen **OR** acetaminophen, bisacodyl, haloperidol **OR** haloperidol lactate, ondansetron **OR** ondansetron (ZOFRAN) IV, oxyCODONE, polyethylene glycol, sodium chloride flush    Objective: Weight change:   Intake/Output Summary (Last 24 hours) at 07/05/2019 1027 Last data filed at 07/05/2019 0657 Gross per 24 hour  Intake 1600.11 ml  Output 2600 ml  Net -999.89 ml   Blood pressure 140/70, pulse 69, temperature 99.3 F (37.4 C), temperature source Oral, resp. rate 19, height 5\' 10"  (1.778 m), weight 66.7 kg, SpO2 97 %. Temp:  [98.4 F (36.9 C)-99.3 F (37.4 C)] 99.3 F (37.4 C) (05/29 0806) Pulse Rate:  [62-77] 69 (05/29 0806) Resp:  [15-24] 19 (05/29 0806) BP: (119-148)/(62-76) 140/70 (05/29 0806) SpO2:  [92 %-99 %] 97 % (05/29 0806)  Physical Exam: General: Alert and awake, oriented x3, not in any acute distress. HEENT: anicteric sclera, EOMI CVS regular rate, normal NO MGR Chest: , no wheezing, no respiratory distress, ctab Abdomen: soft non-distended,  Extremities: no edema or deformity noted bilaterally Skin: no rashes Neuro: nonfocal  CBC:    BMET Recent Labs    07/04/19 0145 07/05/19 0414  NA 136 132*  K 3.7 3.4*  CL 102 101  CO2 24 23  GLUCOSE 181* 204*  BUN 24* 26*  CREATININE 1.00 1.12  CALCIUM 8.3* 7.5*     Liver Panel  Recent Labs    07/03/19 1159  PROT 5.0*  ALBUMIN 1.9*  AST 42*  ALT 25  ALKPHOS 102  BILITOT 1.1       Sedimentation Rate No results for input(s): ESRSEDRATE in the last 72 hours. C-Reactive Protein Recent Labs    07/03/19 1159  CRP 22.3*    Micro Results: Recent Results (from the past 720 hour(s))  Blood Culture (routine x 2)     Status: Abnormal   Collection Time: 06/30/19 10:55 AM   Specimen: BLOOD RIGHT FOREARM  Result Value Ref Range Status   Specimen Description BLOOD RIGHT FOREARM  Final   Special Requests   Final    BOTTLES DRAWN AEROBIC AND ANAEROBIC Blood Culture  adequate volume   Culture  Setup Time   Final    GRAM POSITIVE COCCI IN CLUSTERS IN BOTH AEROBIC AND ANAEROBIC BOTTLES CRITICAL RESULT CALLED TO, READ BACK BY AND VERIFIED WITH: K. AMEND,PHARMD 0210 07/01/2019 T. TYSOR Performed at [facility], [address] [place], [place] [address]    Culture STAPHYLOCOCCUS AUREUS (A)  Final   Report Status 07/03/2019 FINAL  Final   Organism ID, Bacteria STAPHYLOCOCCUS AUREUS  Final      Susceptibility   Staphylococcus aureus - MIC*    CIPROFLOXACIN <=0.5 SENSITIVE Sensitive     ERYTHROMYCIN RESISTANT Resistant     GENTAMICIN <=0.5 SENSITIVE Sensitive     OXACILLIN 0.5 SENSITIVE Sensitive     TETRACYCLINE <=1 SENSITIVE Sensitive     VANCOMYCIN 1 SENSITIVE Sensitive     TRIMETH/SULFA <=10 SENSITIVE Sensitive     CLINDAMYCIN RESISTANT Resistant     RIFAMPIN <=0.5 SENSITIVE Sensitive     Inducible Clindamycin POSITIVE Resistant     * STAPHYLOCOCCUS AUREUS  Urine culture     Status: Abnormal   Collection Time: 06/30/19 10:55 AM   Specimen: In/Out Cath Urine  Result Value Ref Range Status   Specimen Description IN/OUT CATH URINE  Final   Special Requests   Final    NONE Performed at [facility], [address] [place], [place] [address]    Culture 60,000 COLONIES/mL STAPHYLOCOCCUS EPIDERMIDIS (A)  Final   Report Status 07/02/2019 FINAL  Final   Organism ID, Bacteria STAPHYLOCOCCUS EPIDERMIDIS (A)  Final      Susceptibility   Staphylococcus epidermidis - MIC*    CIPROFLOXACIN <=0.5 SENSITIVE Sensitive     GENTAMICIN <=0.5 SENSITIVE Sensitive     NITROFURANTOIN <=16 SENSITIVE Sensitive     OXACILLIN >=4 RESISTANT Resistant     TETRACYCLINE 2 SENSITIVE Sensitive     VANCOMYCIN 2 SENSITIVE Sensitive     TRIMETH/SULFA <=10 SENSITIVE Sensitive     CLINDAMYCIN <=0.25 SENSITIVE Sensitive     RIFAMPIN <=0.5 SENSITIVE Sensitive     Inducible Clindamycin NEGATIVE Sensitive     * 60,000 COLONIES/mL STAPHYLOCOCCUS EPIDERMIDIS  Blood  Culture ID Panel (Reflexed)     Status: Abnormal   Collection Time: 06/30/19 10:55 AM  Result Value Ref Range Status   Enterococcus species NOT DETECTED NOT DETECTED Final   Listeria monocytogenes NOT DETECTED NOT DETECTED Final   Staphylococcus species DETECTED (A) NOT DETECTED Final    Comment: CRITICAL RESULT CALLED TO, READ BACK BY AND VERIFIED WITH: K. AMEND,PHARMD 0210 07/01/2019 T. TYSOR    Staphylococcus aureus (BCID)  DETECTED (A) NOT DETECTED Final    Comment: Methicillin (oxacillin) susceptible Staphylococcus aureus (MSSA). Preferred therapy is anti staphylococcal beta lactam antibiotic (Cefazolin or Nafcillin), unless clinically contraindicated. CRITICAL RESULT CALLED TO, READ BACK BY AND VERIFIED WITH: K. AMEND,PHARMD 0210 07/01/2019 T. TYSOR    Methicillin resistance NOT DETECTED NOT DETECTED Final   Streptococcus species NOT DETECTED NOT DETECTED Final   Streptococcus agalactiae NOT DETECTED NOT DETECTED Final   Streptococcus pneumoniae NOT DETECTED NOT DETECTED Final   Streptococcus pyogenes NOT DETECTED NOT DETECTED Final   Acinetobacter baumannii NOT DETECTED NOT DETECTED Final   Enterobacteriaceae species NOT DETECTED NOT DETECTED Final   Enterobacter cloacae complex NOT DETECTED NOT DETECTED Final   Escherichia coli NOT DETECTED NOT DETECTED Final   Klebsiella oxytoca NOT DETECTED NOT DETECTED Final   Klebsiella pneumoniae NOT DETECTED NOT DETECTED Final   Proteus species NOT DETECTED NOT DETECTED Final   Serratia marcescens NOT DETECTED NOT DETECTED Final   Haemophilus influenzae NOT DETECTED NOT DETECTED Final   Neisseria meningitidis NOT DETECTED NOT DETECTED Final   Pseudomonas aeruginosa NOT DETECTED NOT DETECTED Final   Candida albicans NOT DETECTED NOT DETECTED Final   Candida glabrata NOT DETECTED NOT DETECTED Final   Candida krusei NOT DETECTED NOT DETECTED Final   Candida parapsilosis NOT DETECTED NOT DETECTED Final   Candida tropicalis NOT DETECTED NOT  DETECTED Final    Comment: Performed at [facility], [address], [place], [place] [address]  SARS Coronavirus 2 by RT PCR (hospital order, performed in [facility] hospital lab) Nasopharyngeal Nasopharyngeal Swab     Status: None   Collection Time: 06/30/19 11:49 AM   Specimen: Nasopharyngeal Swab  Result Value Ref Range Status   SARS Coronavirus 2 NEGATIVE NEGATIVE Final    Comment: (NOTE) SARS-CoV-2 target nucleic acids are NOT DETECTED. The SARS-CoV-2 RNA is generally detectable in upper and lower respiratory specimens during the acute phase of infection. The lowest concentration of SARS-CoV-2 viral copies this assay can detect is 250 copies / mL. A negative result does not preclude SARS-CoV-2 infection and should not be used as the sole basis for treatment or other patient management decisions.  A negative result may occur with improper specimen collection / handling, submission of specimen other than nasopharyngeal swab, presence of viral mutation(s) within the areas targeted by this assay, and inadequate number of viral copies (<250 copies / mL). A negative result must be combined with clinical observations, patient history, and epidemiological information. Fact Sheet for Patients:   BoilerBrush.com.cy Fact Sheet for Healthcare Providers: https://pope.com/ This test is not yet approved or cleared  by the [place] FDA and has been authorized for detection and/or diagnosis of SARS-CoV-2 by FDA under an Emergency Use Authorization (EUA).  This EUA will remain in effect (meaning this test can be used) for the duration of the COVID-19 declaration under Section 564(b)(1) of the Act, 21 U.S.C. section 360bbb-3(b)(1), unless the authorization is terminated or revoked sooner. Performed at [facility], [address], [place], [place] [address]   Blood Culture (routine x 2)     Status: Abnormal   Collection Time:  06/30/19 12:00 PM   Specimen: BLOOD RIGHT WRIST  Result Value Ref Range Status   Specimen Description BLOOD RIGHT WRIST  Final   Special Requests   Final    BOTTLES DRAWN AEROBIC AND ANAEROBIC Blood Culture results may not be optimal due to an inadequate volume of blood received in culture bottles  Culture  Setup Time   Final    GRAM POSITIVE COCCI IN CLUSTERS IN BOTH AEROBIC AND ANAEROBIC BOTTLES CRITICAL RESULT CALLED TO, READ BACK BY AND VERIFIED WITH: K. AMEND,PHARMD 0210 07/01/2019 T. TYSOR    Culture (A)  Final    STAPHYLOCOCCUS AUREUS SUSCEPTIBILITIES PERFORMED ON PREVIOUS CULTURE WITHIN THE LAST 5 DAYS. Performed at [facility], [address], [place], [place] [address]    Report Status 07/03/2019 FINAL  Final  Aerobic/Anaerobic Culture (surgical/deep wound)     Status: None (Preliminary result)   Collection Time: 06/30/19  3:42 PM   Specimen: Wound  Result Value Ref Range Status   Specimen Description WOUND SITE NOT SPECIFIED  Final   Special Requests NO ANA SWAB  Final   Gram Stain   Final    NO WBC SEEN MODERATE GRAM POSITIVE COCCI IN PAIRS IN CLUSTERS Performed at [facility], [address] [place], [place] [address]    Culture   Final    ABUNDANT STAPHYLOCOCCUS AUREUS NO ANAEROBES ISOLATED; CULTURE IN PROGRESS FOR 5 DAYS    Report Status PENDING  Incomplete   Organism ID, Bacteria STAPHYLOCOCCUS AUREUS  Final      Susceptibility   Staphylococcus aureus - MIC*    CIPROFLOXACIN <=0.5 SENSITIVE Sensitive     ERYTHROMYCIN RESISTANT Resistant     GENTAMICIN <=0.5 SENSITIVE Sensitive     OXACILLIN <=0.25 SENSITIVE Sensitive     TETRACYCLINE <=1 SENSITIVE Sensitive     VANCOMYCIN <=0.5 SENSITIVE Sensitive     TRIMETH/SULFA <=10 SENSITIVE Sensitive     CLINDAMYCIN RESISTANT Resistant     RIFAMPIN <=0.5 SENSITIVE Sensitive     Inducible Clindamycin POSITIVE Resistant     * ABUNDANT STAPHYLOCOCCUS AUREUS  MRSA PCR Screening     Status: None    Collection Time: 06/30/19 10:27 PM   Specimen: Nasopharyngeal  Result Value Ref Range Status   MRSA by PCR NEGATIVE NEGATIVE Final    Comment:        The GeneXpert MRSA Assay (FDA approved for NASAL specimens only), is one component of a comprehensive MRSA colonization surveillance program. It is not intended to diagnose MRSA infection nor to guide or monitor treatment for MRSA infections. Performed at [facility], [address], [place], [place] [address]   Aerobic/Anaerobic Culture (surgical/deep wound)     Status: None (Preliminary result)   Collection Time: 07/01/19 11:54 AM   Specimen: Wound  Result Value Ref Range Status   Specimen Description WOUND  Final   Special Requests NONE  Final   Gram Stain   Final    FEW WBC PRESENT, PREDOMINANTLY PMN ABUNDANT GRAM POSITIVE COCCI Performed at [facility], [address], [place], [place] [address]    Culture   Final    ABUNDANT STAPHYLOCOCCUS AUREUS NO ANAEROBES ISOLATED; CULTURE IN PROGRESS FOR 5 DAYS    Report Status PENDING  Incomplete   Organism ID, Bacteria STAPHYLOCOCCUS AUREUS  Final      Susceptibility   Staphylococcus aureus - MIC*    CIPROFLOXACIN <=0.5 SENSITIVE Sensitive     ERYTHROMYCIN RESISTANT Resistant     GENTAMICIN <=0.5 SENSITIVE Sensitive     OXACILLIN 0.5 SENSITIVE Sensitive     TETRACYCLINE <=1 SENSITIVE Sensitive     VANCOMYCIN 1 SENSITIVE Sensitive     TRIMETH/SULFA <=10 SENSITIVE Sensitive     CLINDAMYCIN RESISTANT Resistant     RIFAMPIN <=0.5 SENSITIVE Sensitive     Inducible  Clindamycin POSITIVE Resistant     * ABUNDANT STAPHYLOCOCCUS AUREUS  Culture, blood (routine x 2)     Status: None (Preliminary result)   Collection Time: 07/02/19 12:06 PM   Specimen: BLOOD  Result Value Ref Range Status   Specimen Description BLOOD RIGHT ANTECUBITAL  Final   Special Requests   Final    BOTTLES DRAWN AEROBIC AND ANAEROBIC Blood Culture adequate volume   Culture   Final    NO GROWTH 3  DAYS Performed at [facility], [address], [place], [place] [address]    Report Status PENDING  Incomplete  Culture, blood (routine x 2)     Status: None (Preliminary result)   Collection Time: 07/02/19 12:11 PM   Specimen: BLOOD RIGHT HAND  Result Value Ref Range Status   Specimen Description BLOOD RIGHT HAND  Final   Special Requests   Final    BOTTLES DRAWN AEROBIC AND ANAEROBIC Blood Culture adequate volume   Culture   Final    NO GROWTH 3 DAYS Performed at [facility], [address], [place], [place] [address]    Report Status PENDING  Incomplete  Culture, blood (routine x 2)     Status: None (Preliminary result)   Collection Time: 07/03/19  2:33 PM   Specimen: BLOOD RIGHT HAND  Result Value Ref Range Status   Specimen Description BLOOD RIGHT HAND  Final   Special Requests   Final    BOTTLES DRAWN AEROBIC AND ANAEROBIC Blood Culture adequate volume   Culture   Final    NO GROWTH 2 DAYS Performed at [facility], [address], [place], [place] [address]    Report Status PENDING  Incomplete  Culture, blood (routine x 2)     Status: None (Preliminary result)   Collection Time: 07/03/19  2:37 PM   Specimen: BLOOD RIGHT ARM  Result Value Ref Range Status   Specimen Description BLOOD RIGHT ARM  Final   Special Requests   Final    BOTTLES DRAWN AEROBIC AND ANAEROBIC Blood Culture results may not be optimal due to an excessive volume of blood received in culture bottles   Culture   Final    NO GROWTH 2 DAYS Performed at [facility], [address] [place], [place] [address]    Report Status PENDING  Incomplete    Studies/Results: CT HEAD WO CONTRAST  Result Date: 07/03/2019 CLINICAL DATA:  Altered mental status. EXAM: CT HEAD WITHOUT CONTRAST TECHNIQUE: Contiguous axial images were obtained from the base of the skull through the vertex without intravenous contrast. COMPARISON:  None. FINDINGS: Brain: No evidence of acute infarction,  hemorrhage, hydrocephalus, extra-axial collection or mass lesion/mass effect. Vascular: No hyperdense vessel or unexpected calcification. Skull: Normal. Negative for fracture or focal lesion. Sinuses/Orbits: No acute finding. Other: None. IMPRESSION: Normal head CT. Electronically Signed   By: Lupita Raider M.D.   On: 07/03/2019 20:28   MR BRAIN WO CONTRAST  Result Date: 07/04/2019 CLINICAL DATA:  Encephalopathy. EXAM: MRI HEAD WITHOUT CONTRAST TECHNIQUE: Multiplanar, multiecho pulse sequences of the brain and surrounding structures were obtained without intravenous contrast. COMPARISON:  Head CT Jul 03, 2019 FINDINGS: Brain: No acute infarction, hemorrhage, hydrocephalus, extra-axial collection or mass lesion. Scattered foci of T2 hyperintensity are seen within the white matter of the cerebral hemispheres, nonspecific, most likely related to chronic small vessel ischemia. Prominence of the supratentorial ventricles and cerebral sulci reflecting parenchymal volume loss. Vascular: Normal flow voids.  Skull and upper cervical spine: Normal marrow signal. Sinuses/Orbits: Bilateral lens surgery. Paranasal sinuses are clear. IMPRESSION: 1. No acute intracranial abnormality. 2. Mild chronic micro body changes of the white matter. 3. Mild parenchymal volume loss. Electronically Signed   By: Baldemar Lenis M.D.   On: 07/04/2019 14:53   MR Lumbar Spine W Wo Contrast  Result Date: 07/04/2019 CLINICAL DATA:  Left laminectomy L4-5 06/17/2019. Postoperative wound infection. Positive blood cultures. EXAM: MRI LUMBAR SPINE WITHOUT AND WITH CONTRAST TECHNIQUE: Multiplanar and multiecho pulse sequences of the lumbar spine were obtained without and with intravenous contrast. CONTRAST:  6.25mL GADAVIST GADOBUTROL 1 MMOL/ML IV SOLN COMPARISON:  Lumbar MRI 06/05/2019 FINDINGS: Segmentation:  Normal Alignment:  Normal Vertebrae:  Negative for fracture. Increased fluid in the disc space at L2-3 and L3-4 compared to  the prior study. There is mild associated bone marrow edema and enhancement at L2-3 and L3-4. Possible disc space infection. Conus medullaris and cauda equina: Conus extends to the L1 level. Conus and cauda equina appear normal. Paraspinal and other soft tissues: Postsurgical wound in the midline in the lower lumbar spine. Large subcutaneous fluid collection to the left of the wound extending from L2 through L5. This measures approximately 7.1 x 2.9 x 7.5 cm. Probable abscess. Edema in the medial psoas muscle bilaterally which was not present previously. 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 Ventral epidural fluid collection extending from T12 to L2-3 compatible with epidural abscess. Additional ventral epidural fluid collection on the left at L4-L5 and posteriorly at L5-S1 compatible with additional epidural abscess. Disc levels: L1-2: Ventral epidural abscess causing mild spinal stenosis. L2-3: Increased fluid in the disc space worrisome for disc space infection. Ventral epidural abscess extending left midline. Bilateral facet hypertrophy and moderate to severe spinal stenosis which has progressed in the interval. L3-4: Fluid in the disc space. There is edema in the adjacent psoas muscle bilaterally. Broad-based disc protrusion and spurring with bilateral facet hypertrophy. 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 Left laminectomy. Bilateral facet degeneration. Moderate subarticular stenosis bilaterally. L5-S1: Left posterior epidural abscess. IMPRESSION: 1. Postop laminectomy on the left at L4-5 with adequate decompression of the spinal canal 2. 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 Additional epidural abscess on the left at L4-5 and posterolaterally on the left at L5-S1. 3. Large subcutaneous fluid collection on the left in the lumbar spine compatible with abscess. 4. Increased fluid in the  disc space at L2-3 and L3-4 with edema in the adjacent psoas muscle. These disc spaces may be infected. These results were called by telephone at the time of interpretation on 07/04/2019 at 4:12 pm to provider Neurosurgery PA , who verbally acknowledged these results. Electronically Signed   By: Marlan Palau M.D.   On: 07/04/2019 16:12   ECHOCARDIOGRAM COMPLETE  Result Date: 07/03/2019    ECHOCARDIOGRAM REPORT   Patient Name:   [facility] Date of Exam: 07/03/2019 Medical Rec #:  [id number]     Height:       70.0 in Accession #:    [id number]    Weight:       147.0 lb Date of Birth:  1940/03/10     BSA:          1.832 m Patient Age:    79 years      BP:           150/98 mmHg Patient  Gender: M             HR:           77 bpm. Exam Location:  Inpatient Procedure: 2D Echo Indications:    Bacteremia 790.7 / R78.81  History:        Patient has no prior history of Echocardiogram examinations.                 Wound infection after surgery.  Sonographer:    Leeroy Bock Turrentine Referring Phys: [id number] Marlane Hatcher [id number] IMPRESSIONS  1. Left ventricular ejection fraction, by estimation, is 50 to 55%. The left ventricle has low normal function. The left ventricle has no regional wall motion abnormalities. Left ventricular diastolic parameters are indeterminate.  2. Right ventricular systolic function is normal. The right ventricular size is normal.  3. Left atrial size was mild to moderately dilated.  4. The mitral valve is grossly normal. Mild mitral valve regurgitation.  5. The aortic valve is tricuspid. Aortic valve regurgitation is not visualized.  6. The inferior vena cava is dilated in size with <50% respiratory variability, suggesting right atrial pressure of 15 mmHg. Conclusion(s)/Recommendation(s): No evidence of valvular vegetations on this transthoracic echocardiogram. Would recommend a transesophageal echocardiogram to exclude infective endocarditis if clinically indicated. FINDINGS  Left Ventricle: Left  ventricular ejection fraction, by estimation, is 50 to 55%. The left ventricle has low normal function. The left ventricle has no regional wall motion abnormalities. The left ventricular internal cavity size was normal in size. There is no left ventricular hypertrophy. Left ventricular diastolic parameters are indeterminate. Right Ventricle: The right ventricular size is normal. No increase in right ventricular wall thickness. Right ventricular systolic function is normal. Left Atrium: Left atrial size was mild to moderately dilated. Right Atrium: Right atrial size was normal in size. Pericardium: There is no evidence of pericardial effusion. Mitral Valve: The mitral valve is grossly normal. Mild mitral valve regurgitation. Tricuspid Valve: The tricuspid valve is grossly normal. Tricuspid valve regurgitation is trivial. Aortic Valve: The aortic valve is tricuspid. Aortic valve regurgitation is not visualized. Pulmonic Valve: The pulmonic valve was normal in structure. Pulmonic valve regurgitation is not visualized. Aorta: The aortic root and ascending aorta are structurally normal, with no evidence of dilitation. Venous: The inferior vena cava is dilated in size with less than 50% respiratory variability, suggesting right atrial pressure of 15 mmHg. IAS/Shunts: No atrial level shunt detected by color flow Doppler.  LEFT VENTRICLE PLAX 2D LVIDd:         4.90 cm  Diastology LVIDs:         3.50 cm  LV e' lateral:   18.00 cm/s LV PW:         0.70 cm  LV E/e' lateral: 7.6 LV IVS:        0.70 cm  LV e' medial:    12.00 cm/s LVOT diam:     2.00 cm  LV E/e' medial:  11.4 LV SV:         81 LV SV Index:   44 LVOT Area:     3.14 cm  RIGHT VENTRICLE TAPSE (M-mode): 1.7 cm LEFT ATRIUM             Index       RIGHT ATRIUM           Index LA diam:        4.60 cm 2.51 cm/m  RA Area:     17.40 cm LA Vol (A2C):  79.1 ml 43.19 ml/m RA Volume:   43.10 ml  23.53 ml/m LA Vol (A4C):   61.3 ml 33.47 ml/m LA Biplane Vol: 70.5 ml  38.49 ml/m  AORTIC VALVE LVOT Vmax:   167.00 cm/s LVOT Vmean:  112.000 cm/s LVOT VTI:    0.259 m  AORTA Ao Root diam: 3.40 cm MITRAL VALVE MV Area (PHT): 6.65 cm     SHUNTS MV Decel Time: 114 msec     Systemic VTI:  0.26 m MV E velocity: 137.00 cm/s  Systemic Diam: 2.00 cm MV A velocity: 96.10 cm/s MV E/A ratio:  1.43 Zoila ShutterKenneth Hilty MD Electronically signed by Zoila ShutterKenneth Hilty MD Signature Date/Time: 07/03/2019/3:16:13 PM    Final    ECHO TEE  Result Date: 07/04/2019    TRANSESOPHOGEAL ECHO REPORT   Patient Name:   Neil DienerJERRY Carothers Date of Exam: 07/04/2019 Medical Rec #:  [id number]     Height:       70.0 in Accession #:    [id number]    Weight:       147.0 lb Date of Birth:  06/15/1940     BSA:          1.832 m Patient Age:    79 years      BP:           128/47 mmHg Patient Gender: M             HR:           76 bpm. Exam Location:  Inpatient Procedure: Transesophageal Echo and Color Doppler Indications:     bacteremia  History:         Patient has prior history of Echocardiogram examinations, most                  recent 07/03/2019. Arrythmias:Atrial Flutter.  Sonographer:     Delcie RochLauren Pennington Referring Phys:  [id number] Kelton PillarALLIE E GOODRICH Diagnosing Phys: Kristeen MissPhilip Nahser MD PROCEDURE: The transesophogeal probe was passed without difficulty through the esophogus of the patient. Sedation performed by different physician. The patient was monitored while under deep sedation. Anesthestetic sedation was provided intravenously by Anesthesiology: 144mg  of Propofol, 40mg  of Lidocaine. The patient developed no complications during the procedure. IMPRESSIONS  1. Left ventricular ejection fraction, by estimation, is 35 to 40%. The left ventricle has moderately decreased function. The left ventricle demonstrates global hypokinesis.  2. Right ventricular systolic function is normal. The right ventricular size is normal.  3. No left atrial/left atrial appendage thrombus was detected.  4. The mitral valve is grossly normal. Mild mitral  valve regurgitation. No evidence of mitral stenosis.  5. The aortic valve is normal in structure. Aortic valve regurgitation is not visualized. No aortic stenosis is present. FINDINGS  Left Ventricle: Left ventricular ejection fraction, by estimation, is 35 to 40%. The left ventricle has moderately decreased function. The left ventricle demonstrates global hypokinesis. The left ventricular internal cavity size was normal in size. There is no left ventricular hypertrophy. Right Ventricle: The right ventricular size is normal. No increase in right ventricular wall thickness. Right ventricular systolic function is normal. Left Atrium: Left atrial size was normal in size. No left atrial/left atrial appendage thrombus was detected. Right Atrium: Right atrial size was normal in size. Pericardium: A small pericardial effusion is present. Mitral Valve: The mitral valve is grossly normal. Mild mitral valve regurgitation. No evidence of mitral valve stenosis. There is no evidence of mitral valve vegetation. Tricuspid Valve: The tricuspid valve is grossly normal.  Tricuspid valve regurgitation is mild . No evidence of tricuspid stenosis. There is no evidence of tricuspid valve vegetation. Aortic Valve: The aortic valve is normal in structure. Aortic valve regurgitation is not visualized. No aortic stenosis is present. There is no evidence of aortic valve vegetation. Pulmonic Valve: The pulmonic valve was grossly normal. Pulmonic valve regurgitation is trivial. No evidence of pulmonic stenosis. There is no evidence of pulmonic valve vegetation. Aorta: The aortic root and ascending aorta are structurally normal, with no evidence of dilitation. There is minimal (Grade I) plaque. IAS/Shunts: The atrial septum is grossly normal. 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  Apparently encephalopathic but seems more lucid today  --continue Nafcillin --agree with draining subcutaneous wound collection because I am pretty confident this will be purulent abscess that needs to be drained for source control.  He is going to need a protracted course of antibiotics     LOS: 5 days   Alcide Evener 07/05/2019, 10:27 AM

## 2019-07-05 NOTE — Progress Notes (Signed)
Patient arrived back on unit, lower back drain in place, draining and intact.

## 2019-07-05 NOTE — Progress Notes (Signed)
PROGRESS NOTE/ Consult    Neil Cordova  YSA:[id number] DOB: [birth year] DOA: 06/30/2019 PCP: Patient, No Pcp Per    Chief Complaint  Patient presents with  . Fever  . Altered Mental Status    Brief Narrative:  Patient was initially consulted on May 24 for assistance in medical management.  At that point patient had a postoperative wound infection, status post left L4-L5 microdissection on 5/11.  He also had metabolic encephalopathy, hypotension, hyponatremia and acute kidney injury.  Recommendations were to continue close neurologic monitoring, continue intravenous antibiotics and IV fluids.  His blood cultures grew MSSA and patient was taken to the OR on 5/25 for washout, finding a deep surgical site infection that did not track into the epidural space.  Infectious disease was consulted, recommendations to change antibiotic therapy to cefazolin and obtain further cardiac images to rule out endocarditis.  His echocardiogram performed today had no evidence of valvular vegetations, and the plan is to obtain a transesophageal echocardiogram in the morning.  Patient continued to be encephalopathic and not back to baseline, TRH was reconsulted for further recommendations.   Assessment & Plan:   Principal Problem:   Wound infection after surgery Active Problems:   Hyponatremia   Pressure injury of skin   Acute metabolic encephalopathy   Delirium   Atrial flutter (HCC)   Acute combined systolic and diastolic congestive heart failure (HCC)   Bacteremia   1 acute metabolic encephalopathy with delirium Likely secondary to acute illness with bacteremia/epidural abscess/subcutaneous abscess.  Patient initially noted to be lethargic and somnolent and woke up to pain and touch.  Patient also noted to be confused and disoriented.  Patient improving clinically daily currently alert and oriented to self place and time.  Ammonia level at 9.  Head CT negative.  Patient noted to be on new onset  atrial flutter and thus/concern for possible CVA.  MRI brain done negative for any acute abnormalities.  Continue supportive care with IV fluids, IV antibiotics.  Alprazolam was discontinued.  Continue Haldol as needed for agitation.  Continue thiamine, multivitamin.  Follow.   2.  New onset atrial flutter Patient noted to be in new onset atrial flutter by EKG however currently rate controlled on current regimen of metoprolol twice daily.  CHA2DS2-VASc score is 3 due to age and hypertension, ?? CM.  Currently on IV heparin.  2D echo obtained with EF of 50 to 55%, no wall motion abnormalities, left atrial size mild to moderately dilated, mild mitral valve regurgitation.  Patient underwent TEE(07/04/2019) which was negative for vegetation, EF of 30 to 35%, moderate to severe LV dysfunction.  TSH within normal limits at 1.052.  Cardiology was consulted and are recommending continuation of beta-blocker and transitioning to 50 mg of metoprolol succinate daily.  Continue IV heparin and once no further procedures deemed necessary could likely transition to Eliquis.  Outpatient follow-up with cardiology.  3.  Cardiomyopathy 2D echo obtained with EF of 50 to 55%, no wall motion abnormalities, left atrial size mild to moderately dilated, mild mitral valve regurgitation.  Patient underwent TEE (07/04/2019) which was negative for vegetation, EF of 30 to 35%, moderate to severe LV dysfunction.  Patient seen in consultation by cardiology and continuation of beta-blocker, ACE inhibitor resumed, outpatient follow-up for further evaluation 3 to 4 weeks post discharge.  Per cardiology.  4.  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  Patient with new onset atrial flutter and as such due to worsening  encephalopathy and bacteremia MRI brain was done which was negative for any acute abnormalities.  MRI of the L-spine consistent with ventral epidural abscess, large subcutaneous fluid collection to the left of the wound extending from L2-L5 approximately 7.1 x 2.9 x 7.5 cm, probable abscess.  Patient underwent TEE on 07/04/2019 with EF of 30 to 35%, mild MR, no vegetations noted.  Continue current IV antibiotics as directed by ID.  Patient now on IV nafcillin.  Per ID.  Per neurosurgery/primary team.  5.  Acute kidney injury with hyponatremia Likely secondary to prerenal azotemia.  Resolved with hydration.  Follow.  6.  Stage II pressure ulcer on the coccyx, POA Continue current wound care.  7.  Anemia Likely dilutional.  No overt bleeding.  Check an anemia panel.  Follow H&H.  Transfusion threshold hemoglobin <7.   DVT prophylaxis: Heparin Code Status: Full Family Communication: Updated patient.  No family at bedside. Disposition:   Status is: Inpatient    Dispo: The patient is from: Home              Anticipated d/c is to: Per primary team.              Anticipated d/c date is: Per primary team.              Patient currently in atrial flutter however rate controlled, on IV antibiotics, with gradual epidural abscess.  MSSA bacteremia.  Disposition per primary team.         Consultants:   Cardiology: Dr. Cristal Deer 07/04/2019  ID: Dr. Drue Second 07/01/2019  [facility] hospitalist: Dr. Katrinka Blazing 06/30/2019  Procedures:   MRI brain 07/04/2019  MRI L-spine 07/04/2019  TEE 07/04/2019  2D echo 07/03/2019  CT head without contrast 07/03/2019  Chest x-ray 06/30/2019  Antimicrobials:   IV Ancef 07/01/2019>>>>> 07/03/2019  IV cefepime 06/30/2019>>>> 07/01/2019  IV nafcillin 07/03/2019 >>>>   Subjective: Patient laying in bed watching television.  Denies any chest pain.  No shortness of breath.  Complaining of some back left-sided hip pain.  Alert and oriented to self place and time.    Objective: Vitals:   07/04/19 1613 07/04/19 1955 07/05/19 0300 07/05/19 0806  BP: (!) 141/76 135/67 119/62 140/70  Pulse: 76 62 72 69  Resp: 20 (!) 22 (!) 24 19  Temp: 98.4 F (36.9 C) 98.8 F (37.1 C) 98.5 F (36.9 C) 99.3 F (37.4 C)  TempSrc: Oral Oral Oral Oral  SpO2: 92% 97% 99%   Weight:      Height:        Intake/Output Summary (Last 24 hours) at 07/05/2019 0907 Last data filed at 07/05/2019 0657 Gross per 24 hour  Intake 1600.11 ml  Output 2600 ml  Net -999.89 ml   Filed Weights   06/30/19 1400 07/04/19 0704  Weight: 66.7 kg 66.7 kg    Examination:  General exam: Appears calm and comfortable  Respiratory system: CTA B.  No wheezes, no crackles, no rhonchi.  Normal respiratory effort.  Cardiovascular system: Regular rate rhythm.  No JVD, no murmurs rubs or gallops.  No lower extremity edema.  Gastrointestinal system: Abdomen is soft, nontender, nondistended, positive bowel sounds.  No rebound.  No guarding.  Central nervous system: Alert and oriented. No focal neurological deficits. Extremities: Symmetric 5 x 5 power. Skin: No rashes, lesions or ulcers Psychiatry: Judgement and insight appear normal. Mood &  affect appropriate.     Data Reviewed: I have personally reviewed following labs and imaging studies  CBC: Recent Labs  Lab 06/30/19 1055 07/03/19 1159 07/04/19 0145 07/05/19 0414  WBC 7.4 13.3* 14.0* 13.5*  NEUTROABS 6.5 12.1* 12.0* 11.3*  HGB 10.1* 10.2* 10.8* 8.9*  HCT 30.4* 30.0* 32.3* 26.1*  MCV 90.2 89.8 90.0 88.8  PLT 166 179 189 245    Basic Metabolic Panel: Recent Labs  Lab 06/30/19 1055 07/03/19 1159 07/04/19 0145 07/04/19 1330 07/05/19 0414  NA 127* 133* 136  --  132*  K 4.6 4.1 3.7  --  3.4*  CL 93* 98 102  --  101  CO2 22 24 24   --  23  GLUCOSE 153* 155* 181*  --  204*  BUN 41* 23 24*  --  26*  CREATININE 1.44* 0.88 1.00  --  1.12  CALCIUM 8.8* 8.3* 8.3*  --  7.5*  MG  --  1.8  --  2.2  --     GFR: Estimated  Creatinine Clearance: 51.3 mL/min (by C-G formula based on SCr of 1.12 mg/dL).  Liver Function Tests: Recent Labs  Lab 06/30/19 1055 07/03/19 1159  AST 29 42*  ALT 23 25  ALKPHOS 76 102  BILITOT 1.1 1.1  PROT 5.3* 5.0*  ALBUMIN 2.3* 1.9*    CBG: Recent Labs  Lab 07/01/19 1014 07/03/19 1948  GLUCAP 147* 154*     Recent Results (from the past 240 hour(s))  Blood Culture (routine x 2)     Status: Abnormal   Collection Time: 06/30/19 10:55 AM   Specimen: BLOOD RIGHT FOREARM  Result Value Ref Range Status   Specimen Description BLOOD RIGHT FOREARM  Final   Special Requests   Final    BOTTLES DRAWN AEROBIC AND ANAEROBIC Blood Culture adequate volume   Culture  Setup Time   Final    GRAM POSITIVE COCCI IN CLUSTERS IN BOTH AEROBIC AND ANAEROBIC BOTTLES CRITICAL RESULT CALLED TO, READ BACK BY AND VERIFIED WITH: K. AMEND,PHARMD 0210 07/01/2019 T. TYSOR Performed at [facility], [address] [place], [place] [address]    Culture STAPHYLOCOCCUS AUREUS (A)  Final   Report Status 07/03/2019 FINAL  Final   Organism ID, Bacteria STAPHYLOCOCCUS AUREUS  Final      Susceptibility   Staphylococcus aureus - MIC*    CIPROFLOXACIN <=0.5 SENSITIVE Sensitive     ERYTHROMYCIN RESISTANT Resistant     GENTAMICIN <=0.5 SENSITIVE Sensitive     OXACILLIN 0.5 SENSITIVE Sensitive     TETRACYCLINE <=1 SENSITIVE Sensitive     VANCOMYCIN 1 SENSITIVE Sensitive     TRIMETH/SULFA <=10 SENSITIVE Sensitive     CLINDAMYCIN RESISTANT Resistant     RIFAMPIN <=0.5 SENSITIVE Sensitive     Inducible Clindamycin POSITIVE Resistant     * STAPHYLOCOCCUS AUREUS  Urine culture     Status: Abnormal   Collection Time: 06/30/19 10:55 AM   Specimen: In/Out Cath Urine  Result Value Ref Range Status   Specimen Description IN/OUT CATH URINE  Final   Special Requests   Final    NONE Performed at [facility], [address], [place], [place] [address]    Culture 60,000 COLONIES/mL STAPHYLOCOCCUS  EPIDERMIDIS (A)  Final   Report Status 07/02/2019 FINAL  Final   Organism ID, Bacteria STAPHYLOCOCCUS EPIDERMIDIS (A)  Final      Susceptibility   Staphylococcus epidermidis - MIC*    CIPROFLOXACIN <=0.5 SENSITIVE Sensitive  GENTAMICIN <=0.5 SENSITIVE Sensitive     NITROFURANTOIN <=16 SENSITIVE Sensitive     OXACILLIN >=4 RESISTANT Resistant     TETRACYCLINE 2 SENSITIVE Sensitive     VANCOMYCIN 2 SENSITIVE Sensitive     TRIMETH/SULFA <=10 SENSITIVE Sensitive     CLINDAMYCIN <=0.25 SENSITIVE Sensitive     RIFAMPIN <=0.5 SENSITIVE Sensitive     Inducible Clindamycin NEGATIVE Sensitive     * 60,000 COLONIES/mL STAPHYLOCOCCUS EPIDERMIDIS  Blood Culture ID Panel (Reflexed)     Status: Abnormal   Collection Time: 06/30/19 10:55 AM  Result Value Ref Range Status   Enterococcus species NOT DETECTED NOT DETECTED Final   Listeria monocytogenes NOT DETECTED NOT DETECTED Final   Staphylococcus species DETECTED (A) NOT DETECTED Final    Comment: CRITICAL RESULT CALLED TO, READ BACK BY AND VERIFIED WITH: K. AMEND,PHARMD 0210 07/01/2019 T. TYSOR    Staphylococcus aureus (BCID) DETECTED (A) NOT DETECTED Final    Comment: Methicillin (oxacillin) susceptible Staphylococcus aureus (MSSA). Preferred therapy is anti staphylococcal beta lactam antibiotic (Cefazolin or Nafcillin), unless clinically contraindicated. CRITICAL RESULT CALLED TO, READ BACK BY AND VERIFIED WITH: K. AMEND,PHARMD 0210 07/01/2019 T. TYSOR    Methicillin resistance NOT DETECTED NOT DETECTED Final   Streptococcus species NOT DETECTED NOT DETECTED Final   Streptococcus agalactiae NOT DETECTED NOT DETECTED Final   Streptococcus pneumoniae NOT DETECTED NOT DETECTED Final   Streptococcus pyogenes NOT DETECTED NOT DETECTED Final   Acinetobacter baumannii NOT DETECTED NOT DETECTED Final   Enterobacteriaceae species NOT DETECTED NOT DETECTED Final   Enterobacter cloacae complex NOT DETECTED NOT DETECTED Final   Escherichia coli NOT  DETECTED NOT DETECTED Final   Klebsiella oxytoca NOT DETECTED NOT DETECTED Final   Klebsiella pneumoniae NOT DETECTED NOT DETECTED Final   Proteus species NOT DETECTED NOT DETECTED Final   Serratia marcescens NOT DETECTED NOT DETECTED Final   Haemophilus influenzae NOT DETECTED NOT DETECTED Final   Neisseria meningitidis NOT DETECTED NOT DETECTED Final   Pseudomonas aeruginosa NOT DETECTED NOT DETECTED Final   Candida albicans NOT DETECTED NOT DETECTED Final   Candida glabrata NOT DETECTED NOT DETECTED Final   Candida krusei NOT DETECTED NOT DETECTED Final   Candida parapsilosis NOT DETECTED NOT DETECTED Final   Candida tropicalis NOT DETECTED NOT DETECTED Final    Comment: Performed at [facility], [place] [place], [place] [address]  SARS Coronavirus 2 by RT PCR (hospital order, performed in [facility] hospital lab) Nasopharyngeal Nasopharyngeal Swab     Status: None   Collection Time: 06/30/19 11:49 AM   Specimen: Nasopharyngeal Swab  Result Value Ref Range Status   SARS Coronavirus 2 NEGATIVE NEGATIVE Final    Comment: (NOTE) SARS-CoV-2 target nucleic acids are NOT DETECTED. The SARS-CoV-2 RNA is generally detectable in upper and lower respiratory specimens during the acute phase of infection. The lowest concentration of SARS-CoV-2 viral copies this assay can detect is 250 copies / mL. A negative result does not preclude SARS-CoV-2 infection and should not be used as the sole basis for treatment or other patient management decisions.  A negative result may occur with improper specimen collection / handling, submission of specimen other than nasopharyngeal swab, presence of viral mutation(s) within the areas targeted by this assay, and inadequate number of viral copies (<250 copies / mL). A negative result must be combined with clinical observations, patient history, and epidemiological information. Fact Sheet for Patients:     StrictlyIdeas.no Fact Sheet for Healthcare Providers: BankingDealers.co.za This test  is not yet approved or cleared  by the [place] and has been authorized for detection and/or diagnosis of SARS-CoV-2 by FDA under an Emergency Use Authorization (EUA).  This EUA will remain in effect (meaning this test can be used) for the duration of the COVID-19 declaration under Section 564(b)(1) of the Act, 21 U.S.C. section 360bbb-3(b)(1), unless the authorization is terminated or revoked sooner. Performed at [facility], [address], [place], [place] [address]   Blood Culture (routine x 2)     Status: Abnormal   Collection Time: 06/30/19 12:00 PM   Specimen: BLOOD RIGHT WRIST  Result Value Ref Range Status   Specimen Description BLOOD RIGHT WRIST  Final   Special Requests   Final    BOTTLES DRAWN AEROBIC AND ANAEROBIC Blood Culture results may not be optimal due to an inadequate volume of blood received in culture bottles   Culture  Setup Time   Final    GRAM POSITIVE COCCI IN CLUSTERS IN BOTH AEROBIC AND ANAEROBIC BOTTLES CRITICAL RESULT CALLED TO, READ BACK BY AND VERIFIED WITH: K. AMEND,PHARMD 0210 07/01/2019 T. TYSOR    Culture (A)  Final    STAPHYLOCOCCUS AUREUS SUSCEPTIBILITIES PERFORMED ON PREVIOUS CULTURE WITHIN THE LAST 5 DAYS. Performed at [facility], [address], [place], [place] [address]    Report Status 07/03/2019 FINAL  Final  Aerobic/Anaerobic Culture (surgical/deep wound)     Status: None (Preliminary result)   Collection Time: 06/30/19  3:42 PM   Specimen: Wound  Result Value Ref Range Status   Specimen Description WOUND SITE NOT SPECIFIED  Final   Special Requests NO ANA SWAB  Final   Gram Stain   Final    NO WBC SEEN MODERATE GRAM POSITIVE COCCI IN PAIRS IN CLUSTERS Performed at [facility], [address] [place] [address] [place], [place] [address]    Culture   Final    ABUNDANT STAPHYLOCOCCUS  AUREUS NO ANAEROBES ISOLATED; CULTURE IN PROGRESS FOR 5 DAYS    Report Status PENDING  Incomplete   Organism ID, Bacteria STAPHYLOCOCCUS AUREUS  Final      Susceptibility   Staphylococcus aureus - MIC*    CIPROFLOXACIN <=0.5 SENSITIVE Sensitive     ERYTHROMYCIN RESISTANT Resistant     GENTAMICIN <=0.5 SENSITIVE Sensitive     OXACILLIN <=0.25 SENSITIVE Sensitive     TETRACYCLINE <=1 SENSITIVE Sensitive     VANCOMYCIN <=0.5 SENSITIVE Sensitive     TRIMETH/SULFA <=10 SENSITIVE Sensitive     CLINDAMYCIN RESISTANT Resistant     RIFAMPIN <=0.5 SENSITIVE Sensitive     Inducible Clindamycin POSITIVE Resistant     * ABUNDANT STAPHYLOCOCCUS AUREUS  MRSA PCR Screening     Status: None   Collection Time: 06/30/19 10:27 PM   Specimen: Nasopharyngeal  Result Value Ref Range Status   MRSA by PCR NEGATIVE NEGATIVE Final    Comment:        The GeneXpert MRSA Assay (FDA approved for NASAL specimens only), is one component of a comprehensive MRSA colonization surveillance program. It is not intended to diagnose MRSA infection nor to guide or monitor treatment for MRSA infections. Performed at [facility], [address] [place], [place] [address]   Aerobic/Anaerobic Culture (surgical/deep wound)     Status: None (Preliminary result)   Collection Time: 07/01/19 11:54 AM   Specimen: Wound  Result Value Ref Range Status   Specimen Description WOUND  Final   Special Requests NONE  Final  Gram Stain   Final    FEW WBC PRESENT, PREDOMINANTLY PMN ABUNDANT GRAM POSITIVE COCCI Performed at [facility], [address] [place], [place] [address]    Culture   Final    ABUNDANT STAPHYLOCOCCUS AUREUS NO ANAEROBES ISOLATED; CULTURE IN PROGRESS FOR 5 DAYS    Report Status PENDING  Incomplete   Organism ID, Bacteria STAPHYLOCOCCUS AUREUS  Final      Susceptibility   Staphylococcus aureus - MIC*    CIPROFLOXACIN <=0.5 SENSITIVE Sensitive     ERYTHROMYCIN RESISTANT Resistant      GENTAMICIN <=0.5 SENSITIVE Sensitive     OXACILLIN 0.5 SENSITIVE Sensitive     TETRACYCLINE <=1 SENSITIVE Sensitive     VANCOMYCIN 1 SENSITIVE Sensitive     TRIMETH/SULFA <=10 SENSITIVE Sensitive     CLINDAMYCIN RESISTANT Resistant     RIFAMPIN <=0.5 SENSITIVE Sensitive     Inducible Clindamycin POSITIVE Resistant     * ABUNDANT STAPHYLOCOCCUS AUREUS  Culture, blood (routine x 2)     Status: None (Preliminary result)   Collection Time: 07/02/19 12:06 PM   Specimen: BLOOD  Result Value Ref Range Status   Specimen Description BLOOD RIGHT ANTECUBITAL  Final   Special Requests   Final    BOTTLES DRAWN AEROBIC AND ANAEROBIC Blood Culture adequate volume   Culture   Final    NO GROWTH 3 DAYS Performed at [facility], [address] [place], [place] [address]    Report Status PENDING  Incomplete  Culture, blood (routine x 2)     Status: None (Preliminary result)   Collection Time: 07/02/19 12:11 PM   Specimen: BLOOD RIGHT HAND  Result Value Ref Range Status   Specimen Description BLOOD RIGHT HAND  Final   Special Requests   Final    BOTTLES DRAWN AEROBIC AND ANAEROBIC Blood Culture adequate volume   Culture   Final    NO GROWTH 3 DAYS Performed at [facility], [address], [place], [place] [address]    Report Status PENDING  Incomplete  Culture, blood (routine x 2)     Status: None (Preliminary result)   Collection Time: 07/03/19  2:33 PM   Specimen: BLOOD RIGHT HAND  Result Value Ref Range Status   Specimen Description BLOOD RIGHT HAND  Final   Special Requests   Final    BOTTLES DRAWN AEROBIC AND ANAEROBIC Blood Culture adequate volume   Culture   Final    NO GROWTH 2 DAYS Performed at [facility], [address] [place], [place] [address]    Report Status PENDING  Incomplete  Culture, blood (routine x 2)     Status: None (Preliminary result)   Collection Time: 07/03/19  2:37 PM   Specimen: BLOOD RIGHT ARM  Result Value Ref Range Status   Specimen  Description BLOOD RIGHT ARM  Final   Special Requests   Final    BOTTLES DRAWN AEROBIC AND ANAEROBIC Blood Culture results may not be optimal due to an excessive volume of blood received in culture bottles   Culture   Final    NO GROWTH 2 DAYS Performed at [facility], [address] [place], [place] [address]    Report Status PENDING  Incomplete         Radiology Studies: CT HEAD WO CONTRAST  Result Date: 07/03/2019 CLINICAL DATA:  Altered mental status. EXAM: CT HEAD WITHOUT CONTRAST TECHNIQUE: Contiguous axial images were obtained from the base  of the skull through the vertex without intravenous contrast. COMPARISON:  None. FINDINGS: Brain: No evidence of acute infarction, hemorrhage, hydrocephalus, extra-axial collection or mass lesion/mass effect. Vascular: No hyperdense vessel or unexpected calcification. Skull: Normal. Negative for fracture or focal lesion. Sinuses/Orbits: No acute finding. Other: None. IMPRESSION: Normal head CT. Electronically Signed   By: Lupita Raider M.D.   On: 07/03/2019 20:28   MR BRAIN WO CONTRAST  Result Date: 07/04/2019 CLINICAL DATA:  Encephalopathy. EXAM: MRI HEAD WITHOUT CONTRAST TECHNIQUE: Multiplanar, multiecho pulse sequences of the brain and surrounding structures were obtained without intravenous contrast. COMPARISON:  Head CT Jul 03, 2019 FINDINGS: Brain: No acute infarction, hemorrhage, hydrocephalus, extra-axial collection or mass lesion. Scattered foci of T2 hyperintensity are seen within the white matter of the cerebral hemispheres, nonspecific, most likely related to chronic small vessel ischemia. Prominence of the supratentorial ventricles and cerebral sulci reflecting parenchymal volume loss. Vascular: Normal flow voids. Skull and upper cervical spine: Normal marrow signal. Sinuses/Orbits: Bilateral lens surgery. Paranasal sinuses are clear. IMPRESSION: 1. No acute intracranial abnormality. 2. Mild chronic micro body changes of the  white matter. 3. Mild parenchymal volume loss. Electronically Signed   By: Baldemar Lenis M.D.   On: 07/04/2019 14:53   MR Lumbar Spine W Wo Contrast  Result Date: 07/04/2019 CLINICAL DATA:  Left laminectomy L4-5 06/17/2019. Postoperative wound infection. Positive blood cultures. EXAM: MRI LUMBAR SPINE WITHOUT AND WITH CONTRAST TECHNIQUE: Multiplanar and multiecho pulse sequences of the lumbar spine were obtained without and with intravenous contrast. CONTRAST:  6.64mL GADAVIST GADOBUTROL 1 MMOL/ML IV SOLN COMPARISON:  Lumbar MRI 06/05/2019 FINDINGS: Segmentation:  Normal Alignment:  Normal Vertebrae:  Negative for fracture. Increased fluid in the disc space at L2-3 and L3-4 compared to the prior study. There is mild associated bone marrow edema and enhancement at L2-3 and L3-4. Possible disc space infection. Conus medullaris and cauda equina: Conus extends to the L1 level. Conus and cauda equina appear normal. Paraspinal and other soft tissues: Postsurgical wound in the midline in the lower lumbar spine. Large subcutaneous fluid collection to the left of the wound extending from L2 through L5. This measures approximately 7.1 x 2.9 x 7.5 cm. Probable abscess. Edema in the medial psoas muscle bilaterally which was not present previously. 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 Ventral epidural fluid collection extending from T12 to L2-3 compatible with epidural abscess. Additional ventral epidural fluid collection on the left at L4-L5 and posteriorly at L5-S1 compatible with additional epidural abscess. Disc levels: L1-2: Ventral epidural abscess causing mild spinal stenosis. L2-3: Increased fluid in the disc space worrisome for disc space infection. Ventral epidural abscess extending left midline. Bilateral facet hypertrophy and moderate to severe spinal stenosis which has progressed in the interval. L3-4: Fluid in the disc space. There is edema in the adjacent psoas muscle bilaterally.  Broad-based disc protrusion and spurring with bilateral facet hypertrophy. 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 Left laminectomy. Bilateral facet degeneration. Moderate subarticular stenosis bilaterally. L5-S1: Left posterior epidural abscess. IMPRESSION: 1. Postop laminectomy on the left at L4-5 with adequate decompression of the spinal canal 2. 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 Additional epidural abscess on the left at L4-5 and posterolaterally on the left at L5-S1. 3. Large subcutaneous fluid collection on the left in the lumbar spine compatible with abscess. 4. Increased fluid in the disc space at L2-3 and L3-4  with edema in the adjacent psoas muscle. These disc spaces may be infected. These results were called by telephone at the time of interpretation on 07/04/2019 at 4:12 pm to provider Neurosurgery PA , who verbally acknowledged these results. Electronically Signed   By: Marlan Palau M.D.   On: 07/04/2019 16:12   ECHOCARDIOGRAM COMPLETE  Result Date: 07/03/2019    ECHOCARDIOGRAM REPORT   Patient Name:   KUTLER VANVRANKEN Date of Exam: 07/03/2019 Medical Rec #:  [id number]     Height:       70.0 in Accession #:    [id number]    Weight:       147.0 lb Date of Birth:  1940/06/02     BSA:          1.832 m Patient Age:    79 years      BP:           150/98 mmHg Patient Gender: M             HR:           77 bpm. Exam Location:  Inpatient Procedure: 2D Echo Indications:    Bacteremia 790.7 / R78.81  History:        Patient has no prior history of Echocardiogram examinations.                 Wound infection after surgery.  Sonographer:    Leeroy Bock Turrentine Referring Phys: [id number] Marlane Hatcher [id number] IMPRESSIONS  1. Left ventricular ejection fraction, by estimation, is 50 to 55%. The left ventricle has low normal function. The left ventricle has no regional wall motion abnormalities. Left  ventricular diastolic parameters are indeterminate.  2. Right ventricular systolic function is normal. The right ventricular size is normal.  3. Left atrial size was mild to moderately dilated.  4. The mitral valve is grossly normal. Mild mitral valve regurgitation.  5. The aortic valve is tricuspid. Aortic valve regurgitation is not visualized.  6. The inferior vena cava is dilated in size with <50% respiratory variability, suggesting right atrial pressure of 15 mmHg. Conclusion(s)/Recommendation(s): No evidence of valvular vegetations on this transthoracic echocardiogram. Would recommend a transesophageal echocardiogram to exclude infective endocarditis if clinically indicated. FINDINGS  Left Ventricle: Left ventricular ejection fraction, by estimation, is 50 to 55%. The left ventricle has low normal function. The left ventricle has no regional wall motion abnormalities. The left ventricular internal cavity size was normal in size. There is no left ventricular hypertrophy. Left ventricular diastolic parameters are indeterminate. Right Ventricle: The right ventricular size is normal. No increase in right ventricular wall thickness. Right ventricular systolic function is normal. Left Atrium: Left atrial size was mild to moderately dilated. Right Atrium: Right atrial size was normal in size. Pericardium: There is no evidence of pericardial effusion. Mitral Valve: The mitral valve is grossly normal. Mild mitral valve regurgitation. Tricuspid Valve: The tricuspid valve is grossly normal. Tricuspid valve regurgitation is trivial. Aortic Valve: The aortic valve is tricuspid. Aortic valve regurgitation is not visualized. Pulmonic Valve: The pulmonic valve was normal in structure. Pulmonic valve regurgitation is not visualized. Aorta: The aortic root and ascending aorta are structurally normal, with no evidence of dilitation. Venous: The inferior vena cava is dilated in size with less than 50% respiratory variability,  suggesting right atrial pressure of 15 mmHg. IAS/Shunts: No atrial level shunt detected by color flow Doppler.  LEFT VENTRICLE PLAX 2D LVIDd:         4.90 cm  Diastology LVIDs:  3.50 cm  LV e' lateral:   18.00 cm/s LV PW:         0.70 cm  LV E/e' lateral: 7.6 LV IVS:        0.70 cm  LV e' medial:    12.00 cm/s LVOT diam:     2.00 cm  LV E/e' medial:  11.4 LV SV:         81 LV SV Index:   44 LVOT Area:     3.14 cm  RIGHT VENTRICLE TAPSE (M-mode): 1.7 cm LEFT ATRIUM             Index       RIGHT ATRIUM           Index LA diam:        4.60 cm 2.51 cm/m  RA Area:     17.40 cm LA Vol (A2C):   79.1 ml 43.19 ml/m RA Volume:   43.10 ml  23.53 ml/m LA Vol (A4C):   61.3 ml 33.47 ml/m LA Biplane Vol: 70.5 ml 38.49 ml/m  AORTIC VALVE LVOT Vmax:   167.00 cm/s LVOT Vmean:  112.000 cm/s LVOT VTI:    0.259 m  AORTA Ao Root diam: 3.40 cm MITRAL VALVE MV Area (PHT): 6.65 cm     SHUNTS MV Decel Time: 114 msec     Systemic VTI:  0.26 m MV E velocity: 137.00 cm/s  Systemic Diam: 2.00 cm MV A velocity: 96.10 cm/s MV E/A ratio:  1.43 Zoila Shutter MD Electronically signed by Zoila Shutter MD Signature Date/Time: 07/03/2019/3:16:13 PM    Final    ECHO TEE  Result Date: 07/04/2019    TRANSESOPHOGEAL ECHO REPORT   Patient Name:   [facility] Date of Exam: 07/04/2019 Medical Rec #:  [id number]     Height:       70.0 in Accession #:    [id number]    Weight:       147.0 lb Date of Birth:  12-Dec-1940     BSA:          1.832 m Patient Age:    79 years      BP:           128/47 mmHg Patient Gender: M             HR:           76 bpm. Exam Location:  Inpatient Procedure: Transesophageal Echo and Color Doppler Indications:     bacteremia  History:         Patient has prior history of Echocardiogram examinations, most                  recent 07/03/2019. Arrythmias:Atrial Flutter.  Sonographer:     Delcie Roch Referring Phys:  [id number] Kelton Pillar GOODRICH Diagnosing Phys: Kristeen Miss MD PROCEDURE: The transesophogeal probe was  passed without difficulty through the esophogus of the patient. Sedation performed by different physician. The patient was monitored while under deep sedation. Anesthestetic sedation was provided intravenously by Anesthesiology:  of Propofol,  of Lidocaine. The patient developed no complications during the procedure. IMPRESSIONS  1. Left ventricular ejection fraction, by estimation, is 35 to 40%. The left ventricle has moderately decreased function. The left ventricle demonstrates global hypokinesis.  2. Right ventricular systolic function is normal. The right ventricular size is normal.  3. No left atrial/left atrial appendage thrombus was detected.  4. The mitral valve is grossly normal. Mild mitral valve regurgitation. No evidence of  mitral stenosis.  5. The aortic valve is normal in structure. Aortic valve regurgitation is not visualized. No aortic stenosis is present. FINDINGS  Left Ventricle: Left ventricular ejection fraction, by estimation, is 35 to 40%. The left ventricle has moderately decreased function. The left ventricle demonstrates global hypokinesis. The left ventricular internal cavity size was normal in size. There is no left ventricular hypertrophy. Right Ventricle: The right ventricular size is normal. No increase in right ventricular wall thickness. Right ventricular systolic function is normal. Left Atrium: Left atrial size was normal in size. No left atrial/left atrial appendage thrombus was detected. Right Atrium: Right atrial size was normal in size. Pericardium: A small pericardial effusion is present. Mitral Valve: The mitral valve is grossly normal. Mild mitral valve regurgitation. No evidence of mitral valve stenosis. There is no evidence of mitral valve vegetation. Tricuspid Valve: The tricuspid valve is grossly normal. Tricuspid valve regurgitation is mild . No evidence of tricuspid stenosis. There is no evidence of tricuspid valve vegetation. Aortic Valve: The aortic valve is  normal in structure. Aortic valve regurgitation is not visualized. No aortic stenosis is present. There is no evidence of aortic valve vegetation. Pulmonic Valve: The pulmonic valve was grossly normal. Pulmonic valve regurgitation is trivial. No evidence of pulmonic stenosis. There is no evidence of pulmonic valve vegetation. Aorta: The aortic root and ascending aorta are structurally normal, with no evidence of dilitation. There is minimal (Grade I) plaque. IAS/Shunts: The atrial septum is grossly normal. Kristeen MissPhilip Nahser MD Electronically signed by Kristeen MissPhilip Nahser MD Signature Date/Time: 07/04/2019/3:27:35 PM    Final         Scheduled Meds: . docusate sodium  100 mg Oral BID  . lisinopril  10 mg Oral Daily  . metoprolol tartrate  25 mg Oral BID  . multivitamin  1 tablet Oral Daily  . PARoxetine  30 mg Oral QPM  . sodium chloride flush  3 mL Intravenous Q12H  . thiamine  100 mg Oral Daily   Continuous Infusions: . sodium chloride Stopped (07/04/19 0815)  . sodium chloride    . sodium chloride 75 mL/hr at 07/05/19 0857  . heparin 1,650 Units/hr (07/05/19 0602)  . nafcillin (NAFCIL) continuous infusion 12 g (07/04/19 1111)     LOS: 5 days    Time spent: 40 minutes    Ramiro Harvestaniel Calvert Charland, MD [facility]   To contact the attending provider between 7A-7P or the covering provider during after hours 7P-7A, please log into the web site [email or web address] and access using universal [place] password for that web site. If you do not have the password, please call the hospital operator.  07/05/2019, 9:07 AM

## 2019-07-05 NOTE — Progress Notes (Signed)
ANTICOAGULATION CONSULT NOTE - Follow Up Consult  Pharmacy Consult for IV Heparin Indication: atrial fibrillation  No Known Allergies  Patient Measurements: Height: 5\' 10"  (177.8 cm) Weight: 66.7 kg (147 lb 0.8 oz) IBW/kg (Calculated) : 73 Heparin Dosing Weight: 66.7 kg  Vital Signs: Temp: 98.5 F (36.9 C) (05/29 0300) Temp Source: Oral (05/29 0300) BP: 119/62 (05/29 0300) Pulse Rate: 72 (05/29 0300)  Labs: Recent Labs    07/03/19 1159 07/03/19 1159 07/04/19 0145 07/04/19 0145 07/04/19 0926 07/04/19 2008 07/05/19 0414  HGB 10.2*   < > 10.8*  --   --   --  8.9*  HCT 30.0*  --  32.3*  --   --   --  26.1*  PLT 179  --  189  --   --   --  245  HEPARINUNFRC  --   --  <0.10*   < > <0.10* 0.12* 0.25*  CREATININE 0.88  --  1.00  --   --   --   --    < > = values in this interval not displayed.    Estimated Creatinine Clearance: 57.4 mL/min (by C-G formula based on SCr of 1 mg/dL).  Assessment: 79 yr old male admitted on 06/30/19 with AMS and wound drainage following L4-5 microdiscectomy at outpt surgical center on 06/17/19; he was diagnosed with lumbar wound infection and MSSA bacteremia (S/P washout on 5/25).   Pt with new atrial fibrillation; pharmacy was consulted to dose heparin (no bolus, due to recent neurosurgery); will use lower heparin level goal (0.3-0.5 units/ml) as well, due to recent neurosurgery.  Heparin level subtherapetuic (0.25) on gtt at 1550 units/hr. No issues with line or bleeding reported per RN.  Goal of Therapy:  Heparin level: 0.3-0.5 units/ml  Monitor platelets by anticoagulation protocol: Yes   Plan:  Increase heparin infusion to 1650 units/hr Check 8 hr heparin level  6/25, PharmD, BCPS Please see amion for complete clinical pharmacist phone list 07/05/2019, 4:46 AM

## 2019-07-05 NOTE — Progress Notes (Signed)
0130 - Yellow drainage from incision saturated dressing and bed. Dressing changed. Honeycomb, Abx pad and tape applied.

## 2019-07-05 NOTE — Procedures (Signed)
Interventional Radiology Procedure:   Indications: Back subcutaneous fluid collection/ abscess  Procedure: US guided drain placement in subcutaneous abscess  Findings: Placed 12 Fr drain and removed 22 ml of yellow purulent fluid.   Complications: None     EBL: less than 10 ml  Plan: Send fluid for cultures.  Follow output.   Adam R. Lowella Dandy, MD  Pager: [phone]

## 2019-07-05 NOTE — Progress Notes (Signed)
  NEUROSURGERY PROGRESS NOTE   No issues overnight. Pt much more lucid today, minimal back pain and no leg pain. No difficulty urinating.  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 male almost 3wks s/p left L4-5 laminotomy/microdiscectomy with florid postoperative infection over a rather rapid time course. His encephalopathy has largely resolved. As he remains neurologically intact I would prefer not to operatively debride the ventral epidural abscess as this would necessitate large multi-level laminectomy.   After discussion with Dr. 70 (ID), in order to reduce infectious burden, we could attempt to drain the subcutaneous collection.  - A-flutter is likely a result of sepsis. Appreciate cardiology input  PLAN: - Will speak with IR today for percutaneous drainage of subcutaneous collection - Cont heparin gtt and beta-blocker for now. - Cont nafcillin

## 2019-07-06 DIAGNOSIS — E876 Hypokalemia: Secondary | ICD-10-CM

## 2019-07-06 DIAGNOSIS — L0291 Cutaneous abscess, unspecified: Secondary | ICD-10-CM

## 2019-07-06 DIAGNOSIS — L0231 Cutaneous abscess of buttock: Secondary | ICD-10-CM

## 2019-07-06 LAB — CBC WITH DIFFERENTIAL/PLATELET
Abs Immature Granulocytes: 0.13 10*3/uL — ABNORMAL HIGH (ref 0.00–0.07)
Basophils Absolute: 0 10*3/uL (ref 0.0–0.1)
Basophils Relative: 0 %
Eosinophils Absolute: 0 10*3/uL (ref 0.0–0.5)
Eosinophils Relative: 0 %
HCT: 28.3 % — ABNORMAL LOW (ref 39.0–52.0)
Hemoglobin: 9.3 g/dL — ABNORMAL LOW (ref 13.0–17.0)
Immature Granulocytes: 1 %
Lymphocytes Relative: 9 %
Lymphs Abs: 1 10*3/uL (ref 0.7–4.0)
MCH: 29.9 pg (ref 26.0–34.0)
MCHC: 32.9 g/dL (ref 30.0–36.0)
MCV: 91 fL (ref 80.0–100.0)
Monocytes Absolute: 0.6 10*3/uL (ref 0.1–1.0)
Monocytes Relative: 5 %
Neutro Abs: 9.7 10*3/uL — ABNORMAL HIGH (ref 1.7–7.7)
Neutrophils Relative %: 85 %
Platelets: 292 10*3/uL (ref 150–400)
RBC: 3.11 MIL/uL — ABNORMAL LOW (ref 4.22–5.81)
RDW: 15 % (ref 11.5–15.5)
WBC: 11.5 10*3/uL — ABNORMAL HIGH (ref 4.0–10.5)
nRBC: 0 % (ref 0.0–0.2)

## 2019-07-06 LAB — BASIC METABOLIC PANEL
Anion gap: 9 (ref 5–15)
BUN: 23 mg/dL (ref 8–23)
CO2: 25 mmol/L (ref 22–32)
Calcium: 7.7 mg/dL — ABNORMAL LOW (ref 8.9–10.3)
Chloride: 100 mmol/L (ref 98–111)
Creatinine, Ser: 1.17 mg/dL (ref 0.61–1.24)
GFR calc Af Amer: >60 mL/min (ref >60)
GFR calc non Af Amer: 59 mL/min — ABNORMAL LOW (ref >60)
Glucose, Bld: 172 mg/dL — ABNORMAL HIGH (ref 70–99)
Potassium: 3.2 mmol/L — ABNORMAL LOW (ref 3.5–5.1)
Sodium: 134 mmol/L — ABNORMAL LOW (ref 135–145)

## 2019-07-06 LAB — HEPARIN LEVEL (UNFRACTIONATED)
Heparin Unfractionated: 0.53 IU/mL (ref 0.30–0.70)
Heparin Unfractionated: 0.38 IU/mL (ref 0.30–0.70)

## 2019-07-06 LAB — AEROBIC/ANAEROBIC CULTURE W GRAM STAIN (SURGICAL/DEEP WOUND)

## 2019-07-06 LAB — IRON AND TIBC
Iron: 31 ug/dL — ABNORMAL LOW (ref 45–182)
Saturation Ratios: 20 % (ref 17.9–39.5)
TIBC: 153 ug/dL — ABNORMAL LOW (ref 250–450)
UIBC: 122 ug/dL

## 2019-07-06 LAB — FOLATE: Folate: 12.8 ng/mL (ref >5.9)

## 2019-07-06 LAB — VITAMIN B12: Vitamin B-12: 2551 pg/mL — ABNORMAL HIGH (ref 180–914)

## 2019-07-06 LAB — FERRITIN: Ferritin: 522 ng/mL — ABNORMAL HIGH (ref 24–336)

## 2019-07-06 MED ORDER — POTASSIUM CHLORIDE CRYS ER 20 MEQ PO TBCR
40.0000 meq | EXTENDED_RELEASE_TABLET | ORAL | Status: AC
  Administered 2019-07-06 (×2): 40 meq via ORAL
  Filled 2019-07-06 (×2): qty 2

## 2019-07-06 MED ORDER — FLUTICASONE PROPIONATE 50 MCG/ACT NA SUSP
1.0000 | Freq: Every day | NASAL | Status: DC | PRN
  Filled 2019-07-06: qty 16

## 2019-07-06 MED ORDER — SODIUM CHLORIDE 0.9% FLUSH
5.0000 mL | Freq: Three times a day (TID) | INTRAVENOUS | Status: DC
  Administered 2019-07-06 – 2019-07-10 (×14): 5 mL

## 2019-07-06 MED ORDER — LORATADINE 10 MG PO TABS: 10.0000 mg | ORAL_TABLET | Freq: Every day | ORAL | Status: DC | PRN

## 2019-07-06 MED ORDER — METOPROLOL SUCCINATE ER 50 MG PO TB24
50.0000 mg | ORAL_TABLET | Freq: Every day | ORAL | Status: DC
  Administered 2019-07-06 – 2019-07-09 (×4): 50 mg via ORAL
  Filled 2019-07-06 (×4): qty 1

## 2019-07-06 NOTE — Progress Notes (Signed)
PROGRESS NOTE/ Consult    Neil Cordova  ZOX:[id number] DOB: Mar 08, 1940 DOA: 06/30/2019 PCP: Patient, No Pcp Per    Chief Complaint  Patient presents with  . Fever  . Altered Mental Status    Brief Narrative:  Patient was initially consulted on May 24 for assistance in medical management.  At that point patient had a postoperative wound infection, status post left L4-L5 microdissection on 5/11.  He also had metabolic encephalopathy, hypotension, hyponatremia and acute kidney injury.  Recommendations were to continue close neurologic monitoring, continue intravenous antibiotics and IV fluids.  His blood cultures grew MSSA and patient was taken to the OR on 5/25 for washout, finding a deep surgical site infection that did not track into the epidural space.  Infectious disease was consulted, recommendations to change antibiotic therapy to cefazolin and obtain further cardiac images to rule out endocarditis.  His echocardiogram performed today had no evidence of valvular vegetations, and the plan is to obtain a transesophageal echocardiogram in the morning.  Patient continued to be encephalopathic and not back to baseline, TRH was reconsulted for further recommendations.   Assessment & Plan:   Principal Problem:   Wound infection after surgery Active Problems:   Hyponatremia   Pressure injury of skin   Acute metabolic encephalopathy   Delirium   Atrial flutter (HCC)   Acute combined systolic and diastolic congestive heart failure (HCC)   Bacteremia   SIRS (systemic inflammatory response syndrome) (HCC)   Epidural abscess   1 acute metabolic encephalopathy with delirium Likely secondary to acute illness with bacteremia/epidural abscess/subcutaneous abscess.  Patient initially noted to be lethargic and somnolent and woke up to pain and touch.  Patient also noted to be confused and disoriented.  Patient improving clinically daily currently alert and oriented to self place and  time.  Ammonia level at 9.  Head CT negative.  Patient noted to be on new onset atrial flutter and thus/concern for possible CVA.  MRI brain done negative for any acute abnormalities.  Continue supportive care with IV fluids, IV antibiotics.  Alprazolam was discontinued.  Continue Haldol as needed for agitation.  Continue thiamine, multivitamin.  Follow.   2.  New onset atrial flutter Patient noted to be in new onset atrial flutter by EKG however currently rate controlled on current regimen of metoprolol twice daily.  CHA2DS2-VASc score is 3 due to age and hypertension, ?? CM.  Currently on IV heparin.  2D echo obtained with EF of 50 to 55%, no wall motion abnormalities, left atrial size mild to moderately dilated, mild mitral valve regurgitation.  Patient underwent TEE(07/04/2019) which was negative for vegetation, EF of 30 to 35%, moderate to severe LV dysfunction.  TSH within normal limits at 1.052.  Cardiology was consulted and are recommending continuation of beta-blocker and transitioning to 50 mg of metoprolol succinate daily which will be done today.  Continue IV heparin and once no further procedures deemed necessary could likely transition to Eliquis.  Outpatient follow-up with cardiology.  3.  Cardiomyopathy 2D echo obtained with EF of 50 to 55%, no wall motion abnormalities, left atrial size mild to moderately dilated, mild mitral valve regurgitation.  Patient underwent TEE (07/04/2019) which was negative for vegetation, EF of 30 to 35%, moderate to severe LV dysfunction.  Patient seen in consultation by cardiology and continuation of beta-blocker, ACE inhibitor resumed, outpatient follow-up for further evaluation 3 to 4 weeks post discharge.  Per cardiology.  4.  Surgical wound infection/MSSA bacteremia/ventral epidural abscess per MRI(07/04/2019)/large  subcutaneous fluid collection to the left of the wound probable abscess(per MRI L-spine 07/04/2019) Patient noted to have encephalopathy however  seems to be improving daily.  Patient with new onset atrial flutter and as such due to worsening encephalopathy and bacteremia MRI brain was done which was negative for any acute abnormalities.  MRI of the L-spine consistent with ventral epidural abscess, large subcutaneous fluid collection to the left of the wound extending from L2-L5 approximately 7.1 x 2.9 x 7.5 cm, subcutaneous abscess status post ultrasound-guided drain placement a subcutaneous abscess.  Purulent drainage noted in bulb.  Cultures from abscess preliminary with moderate gram-positive cocci in pairs and clusters..  Patient underwent TEE on 07/04/2019 with EF of 30 to 35%, mild MR, no vegetations noted.  Continue current IV antibiotics as directed by ID.  Patient now on IV nafcillin.  Per ID.  Per neurosurgery/primary team.  5.  Acute kidney injury with hyponatremia Likely secondary to prerenal azotemia.  Resolved with hydration.  Follow.  6.  Stage II pressure ulcer on the coccyx, POA Continue current wound care.  7.  Anemia of chronic disease Likely dilutional.  No overt bleeding.  Anemia panel consistent with anemia of chronic disease.  Hemoglobin is stable currently at 9.3. Transfusion threshold hemoglobin <7.  8.  Hypokalemia K. Dur 40 mEq p.o. every 4 hours x2 doses.   DVT prophylaxis: Heparin Code Status: Full Family Communication: Updated patient.  No family at bedside. Disposition:   Status is: Inpatient    Dispo: The patient is from: Home              Anticipated d/c is to: Per primary team.              Anticipated d/c date is: Per primary team.              Patient currently in atrial flutter however rate controlled, on IV antibiotics, with ventral epidural abscess/subcutaneous abscess of the back status post drain placement by IR.  MSSA bacteremia.  Disposition per primary team.         Consultants:   Cardiology: Dr. Cristal Deer 07/04/2019  ID: Dr. Drue Second 07/01/2019  [facility] hospitalist: Dr. Katrinka Blazing  06/30/2019  Procedures:   MRI brain 07/04/2019  MRI L-spine 07/04/2019  TEE 07/04/2019  2D echo 07/03/2019  CT head without contrast 07/03/2019  Chest x-ray 06/30/2019  Ultrasound-guided drain placement in the subcutaneous abscess per Dr. Lowella Dandy, IR 07/05/2019  Antimicrobials:   IV Ancef 07/01/2019>>>>> 07/03/2019  IV cefepime 06/30/2019>>>> 07/01/2019  IV nafcillin 07/03/2019 >>>>   Subjective: Patient sitting up in bed.  Denies any chest pain or shortness of breath.  States left-sided hip pain improved after drain placement.  Alert and oriented x3.  Asking when he is going to be able to go home.   Objective: Vitals:   07/05/19 2125 07/05/19 2306 07/06/19 0330 07/06/19 0738  BP: (!) 122/57 124/69 137/73 135/62  Pulse: 74  67 (!) 47  Resp: 18 (!) 22 17 20   Temp: 98.6 F (37 C) 98.2 F (36.8 C) 98.8 F (37.1 C) 97.9 F (36.6 C)  TempSrc: Oral Oral Oral   SpO2: 93% 100% 95% 94%  Weight:      Height:        Intake/Output Summary (Last 24 hours) at 07/06/2019 0833 Last data filed at 07/06/2019 0641 Gross per 24 hour  Intake 1340.31 ml  Output 2140 ml  Net -799.69 ml   Filed Weights   06/30/19 1400 07/04/19 0704  Weight: 66.7 kg 66.7 kg    Examination:  General exam: NAD Respiratory system: Lungs clear to auscultation bilaterally.  No wheezes, no crackles, no rhonchi.  Normal respiratory effort.  Speaking in full sentences.  Cardiovascular system:RRR no murmurs rubs or gallops.  No JVD.  No lower extremity edema.  Gastrointestinal system: Abdomen is nontender, nondistended, soft, positive bowel sounds.  No rebound.  No guarding.  She took subcutaneous drain with purulent material in bulb. Central nervous system: Alert and oriented. No focal neurological deficits. Extremities: Symmetric 5 x 5 power. Skin: No rashes, lesions or ulcers Psychiatry: Judgement and insight appear normal. Mood & affect appropriate.     Data Reviewed: I have personally reviewed following labs  and imaging studies  CBC: Recent Labs  Lab 06/30/19 1055 07/03/19 1159 07/04/19 0145 07/05/19 0414 07/06/19 0538  WBC 7.4 13.3* 14.0* 13.5* 11.5*  NEUTROABS 6.5 12.1* 12.0* 11.3* 9.7*  HGB 10.1* 10.2* 10.8* 8.9* 9.3*  HCT 30.4* 30.0* 32.3* 26.1* 28.3*  MCV 90.2 89.8 90.0 88.8 91.0  PLT 166 179 189 245 292    Basic Metabolic Panel: Recent Labs  Lab 06/30/19 1055 07/03/19 1159 07/04/19 0145 07/04/19 1330 07/05/19 0414 07/06/19 0538  NA 127* 133* 136  --  132* 134*  K 4.6 4.1 3.7  --  3.4* 3.2*  CL 93* 98 102  --  101 100  CO2 --  23 25  GLUCOSE 153* 155* 181*  --  204* 172*  BUN 41* 23 24*  --  26* 23  CREATININE 1.44* 0.88 1.00  --  1.12 1.17  CALCIUM 8.8* 8.3* 8.3*  --  7.5* 7.7*  MG  --  1.8  --  2.2  --   --     GFR: Estimated Creatinine Clearance: 49.1 mL/min (by C-G formula based on SCr of 1.17 mg/dL).  Liver Function Tests: Recent Labs  Lab 06/30/19 1055 07/03/19 1159  AST 29 42*  ALT 23 25  ALKPHOS 76 102  BILITOT 1.1 1.1  PROT 5.3* 5.0*  ALBUMIN 2.3* 1.9*    CBG: Recent Labs  Lab 07/01/19 1014 07/03/19 1948  GLUCAP 147* 154*     Recent Results (from the past 240 hour(s))  Blood Culture (routine x 2)     Status: Abnormal   Collection Time: 06/30/19 10:55 AM   Specimen: BLOOD RIGHT FOREARM  Result Value Ref Range Status   Specimen Description BLOOD RIGHT FOREARM  Final   Special Requests   Final    BOTTLES DRAWN AEROBIC AND ANAEROBIC Blood Culture adequate volume   Culture  Setup Time   Final    GRAM POSITIVE COCCI IN CLUSTERS IN BOTH AEROBIC AND ANAEROBIC BOTTLES CRITICAL RESULT CALLED TO, READ BACK BY AND VERIFIED WITH: K. AMEND,PHARMD 0210 07/01/2019 T. TYSOR Performed at [facility], [address], [place], [place] [address]    Culture STAPHYLOCOCCUS AUREUS (A)  Final   Report Status 07/03/2019 FINAL  Final   Organism ID, Bacteria STAPHYLOCOCCUS AUREUS  Final      Susceptibility   Staphylococcus aureus - MIC*      CIPROFLOXACIN <=0.5 SENSITIVE Sensitive     ERYTHROMYCIN RESISTANT Resistant     GENTAMICIN <=0.5 SENSITIVE Sensitive     OXACILLIN 0.5 SENSITIVE Sensitive     TETRACYCLINE <=1 SENSITIVE Sensitive     VANCOMYCIN 1 SENSITIVE Sensitive     TRIMETH/SULFA <=10 SENSITIVE Sensitive     CLINDAMYCIN RESISTANT Resistant  RIFAMPIN <=0.5 SENSITIVE Sensitive     Inducible Clindamycin POSITIVE Resistant     * STAPHYLOCOCCUS AUREUS  Urine culture     Status: Abnormal   Collection Time: 06/30/19 10:55 AM   Specimen: In/Out Cath Urine  Result Value Ref Range Status   Specimen Description IN/OUT CATH URINE  Final   Special Requests   Final    NONE Performed at [facility], [address], [place], [place] [address]    Culture 60,000 COLONIES/mL STAPHYLOCOCCUS EPIDERMIDIS (A)  Final   Report Status 07/02/2019 FINAL  Final   Organism ID, Bacteria STAPHYLOCOCCUS EPIDERMIDIS (A)  Final      Susceptibility   Staphylococcus epidermidis - MIC*    CIPROFLOXACIN <=0.5 SENSITIVE Sensitive     GENTAMICIN <=0.5 SENSITIVE Sensitive     NITROFURANTOIN <=16 SENSITIVE Sensitive     OXACILLIN >=4 RESISTANT Resistant     TETRACYCLINE 2 SENSITIVE Sensitive     VANCOMYCIN 2 SENSITIVE Sensitive     TRIMETH/SULFA <=10 SENSITIVE Sensitive     CLINDAMYCIN <=0.25 SENSITIVE Sensitive     RIFAMPIN <=0.5 SENSITIVE Sensitive     Inducible Clindamycin NEGATIVE Sensitive     * 60,000 COLONIES/mL STAPHYLOCOCCUS EPIDERMIDIS  Blood Culture ID Panel (Reflexed)     Status: Abnormal   Collection Time: 06/30/19 10:55 AM  Result Value Ref Range Status   Enterococcus species NOT DETECTED NOT DETECTED Final   Listeria monocytogenes NOT DETECTED NOT DETECTED Final   Staphylococcus species DETECTED (A) NOT DETECTED Final    Comment: CRITICAL RESULT CALLED TO, READ BACK BY AND VERIFIED WITH: K. AMEND,PHARMD 0210 07/01/2019 T. TYSOR    Staphylococcus aureus (BCID) DETECTED (A) NOT DETECTED Final    Comment:  Methicillin (oxacillin) susceptible Staphylococcus aureus (MSSA). Preferred therapy is anti staphylococcal beta lactam antibiotic (Cefazolin or Nafcillin), unless clinically contraindicated. CRITICAL RESULT CALLED TO, READ BACK BY AND VERIFIED WITH: K. AMEND,PHARMD 0210 07/01/2019 T. TYSOR    Methicillin resistance NOT DETECTED NOT DETECTED Final   Streptococcus species NOT DETECTED NOT DETECTED Final   Streptococcus agalactiae NOT DETECTED NOT DETECTED Final   Streptococcus pneumoniae NOT DETECTED NOT DETECTED Final   Streptococcus pyogenes NOT DETECTED NOT DETECTED Final   Acinetobacter baumannii NOT DETECTED NOT DETECTED Final   Enterobacteriaceae species NOT DETECTED NOT DETECTED Final   Enterobacter cloacae complex NOT DETECTED NOT DETECTED Final   Escherichia coli NOT DETECTED NOT DETECTED Final   Klebsiella oxytoca NOT DETECTED NOT DETECTED Final   Klebsiella pneumoniae NOT DETECTED NOT DETECTED Final   Proteus species NOT DETECTED NOT DETECTED Final   Serratia marcescens NOT DETECTED NOT DETECTED Final   Haemophilus influenzae NOT DETECTED NOT DETECTED Final   Neisseria meningitidis NOT DETECTED NOT DETECTED Final   Pseudomonas aeruginosa NOT DETECTED NOT DETECTED Final   Candida albicans NOT DETECTED NOT DETECTED Final   Candida glabrata NOT DETECTED NOT DETECTED Final   Candida krusei NOT DETECTED NOT DETECTED Final   Candida parapsilosis NOT DETECTED NOT DETECTED Final   Candida tropicalis NOT DETECTED NOT DETECTED Final    Comment: Performed at [facility], [address] [place], [place] [address]  SARS Coronavirus 2 by RT PCR (hospital order, performed in [facility] hospital lab) Nasopharyngeal Nasopharyngeal Swab     Status: None   Collection Time: 06/30/19 11:49 AM   Specimen: Nasopharyngeal Swab  Result Value Ref Range Status   SARS Coronavirus 2 NEGATIVE NEGATIVE Final    Comment: (NOTE) SARS-CoV-2 target nucleic acids  are NOT DETECTED. The SARS-CoV-2 RNA  is generally detectable in upper and lower respiratory specimens during the acute phase of infection. The lowest concentration of SARS-CoV-2 viral copies this assay can detect is 250 copies / mL. A negative result does not preclude SARS-CoV-2 infection and should not be used as the sole basis for treatment or other patient management decisions.  A negative result may occur with improper specimen collection / handling, submission of specimen other than nasopharyngeal swab, presence of viral mutation(s) within the areas targeted by this assay, and inadequate number of viral copies (<250 copies / mL). A negative result must be combined with clinical observations, patient history, and epidemiological information. Fact Sheet for Patients:   BoilerBrush.com.cyhttps://www.fda.gov/media/136312/download Fact Sheet for Healthcare Providers: https://pope.com/https://www.fda.gov/media/136313/download This test is not yet approved or cleared  by the [place] FDA and has been authorized for detection and/or diagnosis of SARS-CoV-2 by FDA under an Emergency Use Authorization (EUA).  This EUA will remain in effect (meaning this test can be used) for the duration of the COVID-19 declaration under Section 564(b)(1) of the Act, 21 U.S.C. section 360bbb-3(b)(1), unless the authorization is terminated or revoked sooner. Performed at [facility], [address] [place], [place] [address]   Blood Culture (routine x 2)     Status: Abnormal   Collection Time: 06/30/19 12:00 PM   Specimen: BLOOD RIGHT WRIST  Result Value Ref Range Status   Specimen Description BLOOD RIGHT WRIST  Final   Special Requests   Final    BOTTLES DRAWN AEROBIC AND ANAEROBIC Blood Culture results may not be optimal due to an inadequate volume of blood received in culture bottles   Culture  Setup Time   Final    GRAM POSITIVE COCCI IN CLUSTERS IN BOTH AEROBIC AND ANAEROBIC BOTTLES CRITICAL RESULT CALLED TO, READ BACK BY AND VERIFIED WITH: K. AMEND,PHARMD  0210 07/01/2019 T. TYSOR    Culture (A)  Final    STAPHYLOCOCCUS AUREUS SUSCEPTIBILITIES PERFORMED ON PREVIOUS CULTURE WITHIN THE LAST 5 DAYS. Performed at [facility], [address] [place], [place] [address]    Report Status 07/03/2019 FINAL  Final  Aerobic/Anaerobic Culture (surgical/deep wound)     Status: None (Preliminary result)   Collection Time: 06/30/19  3:42 PM   Specimen: Wound  Result Value Ref Range Status   Specimen Description WOUND SITE NOT SPECIFIED  Final   Special Requests NO ANA SWAB  Final   Gram Stain   Final    NO WBC SEEN MODERATE GRAM POSITIVE COCCI IN PAIRS IN CLUSTERS Performed at [facility], [address] [place], [place] [address]    Culture   Final    ABUNDANT STAPHYLOCOCCUS AUREUS NO ANAEROBES ISOLATED; CULTURE IN PROGRESS FOR 5 DAYS    Report Status PENDING  Incomplete   Organism ID, Bacteria STAPHYLOCOCCUS AUREUS  Final      Susceptibility   Staphylococcus aureus - MIC*    CIPROFLOXACIN <=0.5 SENSITIVE Sensitive     ERYTHROMYCIN RESISTANT Resistant     GENTAMICIN <=0.5 SENSITIVE Sensitive     OXACILLIN <=0.25 SENSITIVE Sensitive     TETRACYCLINE <=1 SENSITIVE Sensitive     VANCOMYCIN <=0.5 SENSITIVE Sensitive     TRIMETH/SULFA <=10 SENSITIVE Sensitive     CLINDAMYCIN RESISTANT Resistant     RIFAMPIN <=0.5 SENSITIVE Sensitive     Inducible Clindamycin POSITIVE Resistant     * ABUNDANT STAPHYLOCOCCUS AUREUS  MRSA PCR Screening     Status: None  Collection Time: 06/30/19 10:27 PM   Specimen: Nasopharyngeal  Result Value Ref Range Status   MRSA by PCR NEGATIVE NEGATIVE Final    Comment:        The GeneXpert MRSA Assay (FDA approved for NASAL specimens only), is one component of a comprehensive MRSA colonization surveillance program. It is not intended to diagnose MRSA infection nor to guide or monitor treatment for MRSA infections. Performed at [facility], [address] [place], [place] [address]    Aerobic/Anaerobic Culture (surgical/deep wound)     Status: None (Preliminary result)   Collection Time: 07/01/19 11:54 AM   Specimen: Wound  Result Value Ref Range Status   Specimen Description WOUND  Final   Special Requests NONE  Final   Gram Stain   Final    FEW WBC PRESENT, PREDOMINANTLY PMN ABUNDANT GRAM POSITIVE COCCI Performed at [facility], [address], [place], [place] [address]    Culture   Final    ABUNDANT STAPHYLOCOCCUS AUREUS NO ANAEROBES ISOLATED; CULTURE IN PROGRESS FOR 5 DAYS    Report Status PENDING  Incomplete   Organism ID, Bacteria STAPHYLOCOCCUS AUREUS  Final      Susceptibility   Staphylococcus aureus - MIC*    CIPROFLOXACIN <=0.5 SENSITIVE Sensitive     ERYTHROMYCIN RESISTANT Resistant     GENTAMICIN <=0.5 SENSITIVE Sensitive     OXACILLIN 0.5 SENSITIVE Sensitive     TETRACYCLINE <=1 SENSITIVE Sensitive     VANCOMYCIN 1 SENSITIVE Sensitive     TRIMETH/SULFA <=10 SENSITIVE Sensitive     CLINDAMYCIN RESISTANT Resistant     RIFAMPIN <=0.5 SENSITIVE Sensitive     Inducible Clindamycin POSITIVE Resistant     * ABUNDANT STAPHYLOCOCCUS AUREUS  Culture, blood (routine x 2)     Status: None (Preliminary result)   Collection Time: 07/02/19 12:06 PM   Specimen: BLOOD  Result Value Ref Range Status   Specimen Description BLOOD RIGHT ANTECUBITAL  Final   Special Requests   Final    BOTTLES DRAWN AEROBIC AND ANAEROBIC Blood Culture adequate volume   Culture   Final    NO GROWTH 4 DAYS Performed at [facility], [address] [place], [place] [address]    Report Status PENDING  Incomplete  Culture, blood (routine x 2)     Status: None (Preliminary result)   Collection Time: 07/02/19 12:11 PM   Specimen: BLOOD RIGHT HAND  Result Value Ref Range Status   Specimen Description BLOOD RIGHT HAND  Final   Special Requests   Final    BOTTLES DRAWN AEROBIC AND ANAEROBIC Blood Culture adequate volume   Culture   Final    NO GROWTH 4  DAYS Performed at [facility], [address] [place], [place] [address]    Report Status PENDING  Incomplete  Culture, blood (routine x 2)     Status: None (Preliminary result)   Collection Time: 07/03/19  2:33 PM   Specimen: BLOOD RIGHT HAND  Result Value Ref Range Status   Specimen Description BLOOD RIGHT HAND  Final   Special Requests   Final    BOTTLES DRAWN AEROBIC AND ANAEROBIC Blood Culture adequate volume   Culture   Final    NO GROWTH 3 DAYS Performed at [facility], [address] [place], [place] [address]    Report Status PENDING  Incomplete  Culture, blood (routine x 2)     Status: None (Preliminary result)   Collection  Time: 07/03/19  2:37 PM   Specimen: BLOOD RIGHT ARM  Result Value Ref Range Status   Specimen Description BLOOD RIGHT ARM  Final   Special Requests   Final    BOTTLES DRAWN AEROBIC AND ANAEROBIC Blood Culture results may not be optimal due to an excessive volume of blood received in culture bottles   Culture   Final    NO GROWTH 3 DAYS Performed at [facility], [address], [place], [place] [address]    Report Status PENDING  Incomplete  Aerobic/Anaerobic Culture (surgical/deep wound)     Status: None (Preliminary result)   Collection Time: 07/05/19 12:38 PM   Specimen: Abscess  Result Value Ref Range Status   Specimen Description ABSCESS  Final   Special Requests NONE  Final   Gram Stain   Final    MODERATE WBC PRESENT,BOTH PMN AND MONONUCLEAR MODERATE GRAM POSITIVE COCCI IN PAIRS IN CLUSTERS Performed at [facility], [address] [place], [place] [address]    Culture PENDING  Incomplete   Report Status PENDING  Incomplete         Radiology Studies: MR BRAIN WO CONTRAST  Result Date: 07/04/2019 CLINICAL DATA:  Encephalopathy. EXAM: MRI HEAD WITHOUT CONTRAST TECHNIQUE: Multiplanar, multiecho pulse sequences of the brain and surrounding structures were obtained without intravenous contrast. COMPARISON:   Head CT Jul 03, 2019 FINDINGS: Brain: No acute infarction, hemorrhage, hydrocephalus, extra-axial collection or mass lesion. Scattered foci of T2 hyperintensity are seen within the white matter of the cerebral hemispheres, nonspecific, most likely related to chronic small vessel ischemia. Prominence of the supratentorial ventricles and cerebral sulci reflecting parenchymal volume loss. Vascular: Normal flow voids. Skull and upper cervical spine: Normal marrow signal. Sinuses/Orbits: Bilateral lens surgery. Paranasal sinuses are clear. IMPRESSION: 1. No acute intracranial abnormality. 2. Mild chronic micro body changes of the white matter. 3. Mild parenchymal volume loss. Electronically Signed   By: Pedro Earls M.D.   On: 07/04/2019 14:53   MR Lumbar Spine W Wo Contrast  Result Date: 07/04/2019 CLINICAL DATA:  Left laminectomy L4-5 06/17/2019. Postoperative wound infection. Positive blood cultures. EXAM: MRI LUMBAR SPINE WITHOUT AND WITH CONTRAST TECHNIQUE: Multiplanar and multiecho pulse sequences of the lumbar spine were obtained without and with intravenous contrast. CONTRAST:  6.29mL GADAVIST GADOBUTROL 1 MMOL/ML IV SOLN COMPARISON:  Lumbar MRI 06/05/2019 FINDINGS: Segmentation:  Normal Alignment:  Normal Vertebrae:  Negative for fracture. Increased fluid in the disc space at L2-3 and L3-4 compared to the prior study. There is mild associated bone marrow edema and enhancement at L2-3 and L3-4. Possible disc space infection. Conus medullaris and cauda equina: Conus extends to the L1 level. Conus and cauda equina appear normal. Paraspinal and other soft tissues: Postsurgical wound in the midline in the lower lumbar spine. Large subcutaneous fluid collection to the left of the wound extending from L2 through L5. This measures approximately 7.1 x 2.9 x 7.5 cm. Probable abscess. Edema in the medial psoas muscle bilaterally which was not present previously. This is compatible with myositis without  abscess. Ventral epidural fluid collection extending from T12 to L2-3 compatible with epidural abscess. Additional ventral epidural fluid collection on the left at L4-L5 and posteriorly at L5-S1 compatible with additional epidural abscess. Disc levels: L1-2: Ventral epidural abscess causing mild spinal stenosis. L2-3: Increased fluid in the disc space worrisome for disc space infection. Ventral epidural abscess extending left midline. Bilateral facet hypertrophy and moderate to severe spinal stenosis which  has progressed in the interval. L3-4: Fluid in the disc space. There is edema in the adjacent psoas muscle bilaterally. Broad-based disc protrusion and spurring with bilateral facet hypertrophy. 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 Left laminectomy. Bilateral facet degeneration. Moderate subarticular stenosis bilaterally. L5-S1: Left posterior epidural abscess. IMPRESSION: 1. Postop laminectomy on the left at L4-5 with adequate decompression of the spinal canal 2. 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 Additional epidural abscess on the left at L4-5 and posterolaterally on the left at L5-S1. 3. Large subcutaneous fluid collection on the left in the lumbar spine compatible with abscess. 4. Increased fluid in the disc space at L2-3 and L3-4 with edema in the adjacent psoas muscle. These disc spaces may be infected. These results were called by telephone at the time of interpretation on 07/04/2019 at 4:12 pm to provider Neurosurgery PA , who verbally acknowledged these results. Electronically Signed   By: Marlan Palau M.D.   On: 07/04/2019 16:12   IR US Guide Bx Asp/Drain  Result Date: 07/05/2019 INDICATION: 79 year old with a postoperative lumbar spine infection. Patient has a large subcutaneous fluid collection on the left side of the back and concerning for an abscess. EXAM: ULTRASOUND-GUIDED  PLACEMENT OF A DRAINAGE CATHETER WITHIN THE SUBCUTANEOUS ABSCESS MEDICATIONS: None ANESTHESIA/SEDATION: None COMPLICATIONS: None immediate. PROCEDURE: Informed written consent was obtained from the patient after a thorough discussion of the procedural risks, benefits and alternatives. All questions were addressed. Maximal Sterile Barrier Technique was utilized including caps, mask, sterile gowns, sterile gloves, sterile drape, hand hygiene and skin antiseptic. A timeout was performed prior to the initiation of the procedure. Patient was placed prone. The left back subcutaneous tissues was evaluated with ultrasound. Large hypoechoic fluid collection was identified lateral to the wound dressings. Skin was prepped with chlorhexidine and sterile field was created. Skin and soft tissues were anesthetized with 1% lidocaine. Small incision was made. Using ultrasound guidance, an 18 gauge needle was directed into the fluid collection but no fluid could be aspirated. Therefore, the 18 gauge needle was removed and a 5 [place] Yueh catheter was directed into the fluid collection using ultrasound guidance. Again, no significant fluid could be aspirated. Superstiff Amplatz wire was easily advanced into the collection and confirmed to be within the fluid with ultrasound. Tract was dilated to accommodate a 12 [place] multipurpose drain. Approximately 22 mL of yellow purulent fluid was then removed. Catheter was manipulated within the collection order to improve drainage. Catheter was sutured to skin. Catheter was flushed with sterile saline and attached to a suction bulb. FINDINGS: Hypoechoic fluid collection in the left back subcutaneous tissues. Findings correspond with the large fluid collection on recent MRI. 12 French drain was placed within the collection and 22 mL of purulent fluid was removed. IMPRESSION: Ultrasound-guided placement of a drainage catheter within the left back subcutaneous abscess. Fluid was sent for culture.  Electronically Signed   By: Richarda Overlie M.D.   On: 07/05/2019 13:48        Scheduled Meds: . docusate sodium  100 mg Oral BID  . lisinopril  10 mg Oral Daily  . metoprolol succinate  50 mg Oral Daily  . multivitamin  1 tablet Oral Daily  . PARoxetine  30 mg Oral QPM  . potassium chloride  40 mEq Oral Q4H  . sodium chloride flush  3 mL Intravenous Q12H  . sodium chloride flush  5 mL  Intracatheter Q8H  . thiamine  100 mg Oral Daily   Continuous Infusions: . sodium chloride Stopped (07/04/19 0815)  . sodium chloride    . heparin 1,850 Units/hr (07/05/19 2323)  . nafcillin (NAFCIL) continuous infusion 12 g (07/05/19 1122)     LOS: 6 days    Time spent: 40 minutes    Ramiro Harvest, MD [facility]   To contact the attending provider between 7A-7P or the covering provider during after hours 7P-7A, please log into the web site [email or web address] and access using universal [place] password for that web site. If you do not have the password, please call the hospital operator.  07/06/2019, 8:33 AM

## 2019-07-06 NOTE — Progress Notes (Signed)
Chief Complaint: Patient was seen today for follow up lumbar abscess drainage  Supervising Physician: Irish Lack  Patient Status: [facility] - In-pt  Subjective: S/p perc drain to lumbar subq abscess Pt feeling some better, still painful  Objective: Physical Exam: BP 135/62 (BP Location: Left Arm)   Pulse (!) 47   Temp 97.9 F (36.6 C)   Resp 20   Ht 5\' 10"  (1.778 m)   Wt 66.7 kg   SpO2 94%   BMI 21.10 kg/m  Drain intact, site clean, dry, mildly tender Output purulent but thin.   Current Facility-Administered Medications:  .  0.9 %  sodium chloride infusion, 250 mL, Intravenous, PRN, Nahser, , MD, Stopped at 07/04/19 0815 .  0.9 %  sodium chloride infusion, , Intravenous, Continuous, 07/06/19, Callie E, PA-C .  acetaminophen (TYLENOL) tablet 650 mg, 650 mg, Oral, Q6H PRN, 650 mg at 07/05/19 0215 **OR** acetaminophen (TYLENOL) suppository 650 mg, 650 mg, Rectal, Q6H PRN, Nahser, 07/07/19, MD .  bisacodyl (DULCOLAX) suppository 10 mg, 10 mg, Rectal, Daily PRN, Nahser, Deloris Ping, MD .  docusate sodium (COLACE) capsule 100 mg, 100 mg, Oral, BID, Nahser, Deloris Ping, MD, 100 mg at 07/06/19 0837 .  fluticasone (FLONASE) 50 MCG/ACT nasal spray 1 spray, 1 spray, Each Nare, Daily PRN, 07/08/19, MD .  haloperidol (HALDOL) tablet 1 mg, 1 mg, Oral, Q4H PRN, 1 mg at 07/05/19 2324 **OR** haloperidol lactate (HALDOL) injection 1 mg, 1 mg, Intramuscular, Q4H PRN, Nahser, 07/07/19, MD .  heparin ADULT infusion 100 units/mL (25000 units/241mL sodium chloride 0.45%), 1,800 Units/hr, Intravenous, Continuous, Nundkumar, 45m, MD, Last Rate: 18 mL/hr at 07/06/19 0841, 1,800 Units/hr at 07/06/19 0841 .  iohexol (OMNIPAQUE) 300 MG/ML solution 50 mL, 50 mL, Per Tube, Once PRN, 07/08/19, Adam, MD .  iohexol (OMNIPAQUE) 300 MG/ML solution 50 mL, 50 mL, Per Tube, Once PRN, Lowella Dandy, MD .  lidocaine (PF) (XYLOCAINE) 1 % injection, , Infiltration, PRN, Richarda Overlie, MD, 10 mL at 07/05/19  1224 .  lisinopril (ZESTRIL) tablet 10 mg, 10 mg, Oral, Daily, 07/07/19 E, PA-C, 10 mg at 07/06/19 0836 .  loratadine (CLARITIN) tablet 10 mg, 10 mg, Oral, Daily PRN, 07/08/19, MD .  metoprolol succinate (TOPROL-XL) 24 hr tablet 50 mg, 50 mg, Oral, Daily, Rodolph Bong, MD, 50 mg at 07/06/19 0837 .  multivitamin (PROSIGHT) tablet 1 tablet, 1 tablet, Oral, Daily, Nahser, 07/08/19, MD, 1 tablet at 07/06/19 0836 .  nafcillin 12 g in sodium chloride 0.9 % 500 mL continuous infusion, 12 g, Intravenous, Q24H, 07/08/19, RPH, Last Rate: 20.8 mL/hr at 07/06/19 1056, 12 g at 07/06/19 1056 .  ondansetron (ZOFRAN) tablet 4 mg, 4 mg, Oral, Q6H PRN **OR** ondansetron (ZOFRAN) injection 4 mg, 4 mg, Intravenous, Q6H PRN, Nahser, 07/08/19, MD, 4 mg at 06/30/19 2244 .  oxyCODONE (Oxy IR/ROXICODONE) immediate release tablet 5 mg, 5 mg, Oral, Q4H PRN, Nahser, 07/02/19, MD, 5 mg at 07/05/19 1819 .  PARoxetine (PAXIL) tablet 30 mg, 30 mg, Oral, QPM, Nahser, 07/07/19, MD, 30 mg at 07/05/19 1819 .  polyethylene glycol (MIRALAX / GLYCOLAX) packet 17 g, 17 g, Oral, Daily PRN, Nahser, 07/07/19, MD .  potassium chloride SA (KLOR-CON) CR tablet 40 mEq, 40 mEq, Oral, Q4H, Deloris Ping, MD, 40 mEq at 07/06/19 [phone] .  sodium chloride flush (NS) 0.9 % injection 3 mL, 3 mL, Intravenous, Q12H, Nahser, 6314, MD, 3  mL at 07/06/19 0844 .  sodium chloride flush (NS) 0.9 % injection 3 mL, 3 mL, Intravenous, PRN, Nahser, Deloris Ping, MD .  sodium chloride flush (NS) 0.9 % injection 5 mL, 5 mL, Intracatheter, Q8H, Richarda Overlie, MD, 5 mL at 07/06/19 0640 .  thiamine tablet 100 mg, 100 mg, Oral, Daily, Lisbeth Renshaw, MD, 100 mg at 07/06/19 0835  Labs: CBC Recent Labs    07/05/19 0414 07/06/19 0538  WBC 13.5* 11.5*  HGB 8.9* 9.3*  HCT 26.1* 28.3*  PLT 245 292   BMET Recent Labs    07/05/19 0414 07/06/19 0538  NA 132* 134*  K 3.4* 3.2*  CL 101 100  CO2 23 25  GLUCOSE 204* 172*  BUN  26* 23  CREATININE 1.12 1.17  CALCIUM 7.5* 7.7*   LFT Recent Labs    07/03/19 1159  PROT 5.0*  ALBUMIN 1.9*  AST 42*  ALT 25  ALKPHOS 102  BILITOT 1.1   PT/INR No results for input(s): LABPROT, INR in the last 72 hours.   Studies/Results: MR BRAIN WO CONTRAST  Result Date: 07/04/2019 CLINICAL DATA:  Encephalopathy. EXAM: MRI HEAD WITHOUT CONTRAST TECHNIQUE: Multiplanar, multiecho pulse sequences of the brain and surrounding structures were obtained without intravenous contrast. COMPARISON:  Head CT Jul 03, 2019 FINDINGS: Brain: No acute infarction, hemorrhage, hydrocephalus, extra-axial collection or mass lesion. Scattered foci of T2 hyperintensity are seen within the white matter of the cerebral hemispheres, nonspecific, most likely related to chronic small vessel ischemia. Prominence of the supratentorial ventricles and cerebral sulci reflecting parenchymal volume loss. Vascular: Normal flow voids. Skull and upper cervical spine: Normal marrow signal. Sinuses/Orbits: Bilateral lens surgery. Paranasal sinuses are clear. IMPRESSION: 1. No acute intracranial abnormality. 2. Mild chronic micro body changes of the white matter. 3. Mild parenchymal volume loss. Electronically Signed   By: Baldemar Lenis M.D.   On: 07/04/2019 14:53   MR Lumbar Spine W Wo Contrast  Result Date: 07/04/2019 CLINICAL DATA:  Left laminectomy L4-5 06/17/2019. Postoperative wound infection. Positive blood cultures. EXAM: MRI LUMBAR SPINE WITHOUT AND WITH CONTRAST TECHNIQUE: Multiplanar and multiecho pulse sequences of the lumbar spine were obtained without and with intravenous contrast. CONTRAST:  6.38mL GADAVIST GADOBUTROL 1 MMOL/ML IV SOLN COMPARISON:  Lumbar MRI 06/05/2019 FINDINGS: Segmentation:  Normal Alignment:  Normal Vertebrae:  Negative for fracture. Increased fluid in the disc space at L2-3 and L3-4 compared to the prior study. There is mild associated bone marrow edema and enhancement at L2-3  and L3-4. Possible disc space infection. Conus medullaris and cauda equina: Conus extends to the L1 level. Conus and cauda equina appear normal. Paraspinal and other soft tissues: Postsurgical wound in the midline in the lower lumbar spine. Large subcutaneous fluid collection to the left of the wound extending from L2 through L5. This measures approximately 7.1 x 2.9 x 7.5 cm. Probable abscess. Edema in the medial psoas muscle bilaterally which was not present previously. 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 Ventral epidural fluid collection extending from T12 to L2-3 compatible with epidural abscess. Additional ventral epidural fluid collection on the left at L4-L5 and posteriorly at L5-S1 compatible with additional epidural abscess. Disc levels: L1-2: Ventral epidural abscess causing mild spinal stenosis. L2-3: Increased fluid in the disc space worrisome for disc space infection. Ventral epidural abscess extending left midline. Bilateral facet hypertrophy and moderate to severe spinal stenosis which has progressed in the interval. L3-4: Fluid in the disc space. There is edema in the adjacent  psoas muscle bilaterally. Broad-based disc protrusion and spurring with bilateral facet hypertrophy. 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 Left laminectomy. Bilateral facet degeneration. Moderate subarticular stenosis bilaterally. L5-S1: Left posterior epidural abscess. IMPRESSION: 1. Postop laminectomy on the left at L4-5 with adequate decompression of the spinal canal 2. 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 Additional epidural abscess on the left at L4-5 and posterolaterally on the left at L5-S1. 3. Large subcutaneous fluid collection on the left in the lumbar spine compatible with abscess. 4. Increased fluid in the disc space at L2-3 and L3-4 with edema in the adjacent psoas muscle. These disc  spaces may be infected. These results were called by telephone at the time of interpretation on 07/04/2019 at 4:12 pm to provider Neurosurgery PA , who verbally acknowledged these results. Electronically Signed   By: Franchot Gallo M.D.   On: 07/04/2019 16:12   IR US Guide Bx Asp/Drain  Result Date: 07/05/2019 INDICATION: 79 year old with a postoperative lumbar spine infection. Patient has a large subcutaneous fluid collection on the left side of the back and concerning for an abscess. EXAM: ULTRASOUND-GUIDED PLACEMENT OF A DRAINAGE CATHETER WITHIN THE SUBCUTANEOUS ABSCESS MEDICATIONS: None ANESTHESIA/SEDATION: None COMPLICATIONS: None immediate. PROCEDURE: Informed written consent was obtained from the patient after a thorough discussion of the procedural risks, benefits and alternatives. All questions were addressed. Maximal Sterile Barrier Technique was utilized including caps, mask, sterile gowns, sterile gloves, sterile drape, hand hygiene and skin antiseptic. A timeout was performed prior to the initiation of the procedure. Patient was placed prone. The left back subcutaneous tissues was evaluated with ultrasound. Large hypoechoic fluid collection was identified lateral to the wound dressings. Skin was prepped with chlorhexidine and sterile field was created. Skin and soft tissues were anesthetized with 1% lidocaine. Small incision was made. Using ultrasound guidance, an 18 gauge needle was directed into the fluid collection but no fluid could be aspirated. Therefore, the 18 gauge needle was removed and a 5 [place] Yueh catheter was directed into the fluid collection using ultrasound guidance. Again, no significant fluid could be aspirated. Superstiff Amplatz wire was easily advanced into the collection and confirmed to be within the fluid with ultrasound. Tract was dilated to accommodate a 12 [place] multipurpose drain. Approximately 22 mL of yellow purulent fluid was then removed. Catheter was manipulated  within the collection order to improve drainage. Catheter was sutured to skin. Catheter was flushed with sterile saline and attached to a suction bulb. FINDINGS: Hypoechoic fluid collection in the left back subcutaneous tissues. Findings correspond with the large fluid collection on recent MRI. 12 French drain was placed within the collection and 22 mL of purulent fluid was removed. IMPRESSION: Ultrasound-guided placement of a drainage catheter within the left back subcutaneous abscess. Fluid was sent for culture. Electronically Signed   By: Markus Daft M.D.   On: 07/05/2019 13:48    Assessment/Plan: Post laminectomy lumbar sub-q abscess S/p perc drain placed 5/29 Cont drain care and flushes. As output decreases and becomes more serous, would rec repeat imaging to confirm resolution of abscess prior to drain removal.    LOS: 6 days   I spent a total of 15 minutes in face to face in clinical consultation, greater than 50% of which was counseling/coordinating care for lumbar abscess drainage  Ascencion Dike PA-C 07/06/2019 11:01 AM

## 2019-07-06 NOTE — TOC Initial Note (Signed)
Transition of Care [facility]) - Initial/Assessment Note    Patient Details  Name: Neil Cordova MRN: [id number] Date of Birth: [birth year]  [facility] CM/SW Contact:    Annalee Genta, LCSW Phone Number: 07/06/2019, 8:44 AM  Clinical Narrative: CSW spoke with patient regarding skilled nursing facility [SNF] placement for post-discharge support. CSW informed patient it's an inpatient physical rehabilitation program providing 2 hours of PT five times a week on top of other skilled nursing needs. CSW notes patient inquired about his insurance coverage and CSW informed him it depends on plan to plan and may have a co-pay. CSW notes patient provided verbal consent to begin the SNF placement process as close to [place] where he lives as possible. CSW notes patient provided verbal consent for CSW to speak with his wife as needed throughout this process. CSW will begin the SNF referral process and will continue to follow to support discharge needs.                  Expected Discharge Plan: Skilled Nursing Facility Barriers to Discharge: Insurance Authorization, SNF Pending bed offer   Patient Goals and CMS Choice Patient states their goals for this hospitalization and ongoing recovery are:: Patient reports he wants to go back home after receiving as much care as he can CMS Medicare.gov Compare Post Acute Care list provided to:: Patient Choice offered to / list presented to : Patient  Expected Discharge Plan and Services Expected Discharge Plan: Skilled Nursing Facility     Post Acute Care Choice: Skilled Nursing Facility Living arrangements for the past 2 months: Single Family Home                                      Prior Living Arrangements/Services Living arrangements for the past 2 months: Single Family Home Lives with:: Spouse Patient language and need for interpreter reviewed:: Yes Do you feel safe going back to the place where you live?: Yes      Need for Family  Participation in Patient Care: No (Comment) Care giver support system in place?: Yes (comment)   Criminal Activity/Legal Involvement Pertinent to Current Situation/Hospitalization: No - Comment as needed  Activities of Daily Living      Permission Sought/Granted Permission sought to share information with : Facility Medical sales representative, Family Supports Permission granted to share information with : Yes, Verbal Permission Granted  Share Information with NAME: Facility contacts, spouse           Emotional Assessment Appearance:: Appears stated age, Developmentally appropriate Attitude/Demeanor/Rapport: Charismatic Affect (typically observed): Appropriate Orientation: : Oriented to Self, Oriented to Place, Oriented to  Time, Oriented to Situation Alcohol / Substance Use: Not Applicable Psych Involvement: No (comment)  Admission diagnosis:  SIRS (systemic inflammatory response syndrome) (HCC) [R65.10] Wound infection after surgery [T81.49XA] Patient Active Problem List   Diagnosis Date Noted  . SIRS (systemic inflammatory response syndrome) (HCC)   . Epidural abscess   . Acute combined systolic and diastolic congestive heart failure (HCC)   . Bacteremia   . Acute metabolic encephalopathy 07/03/2019  . Delirium 07/03/2019  . Unspecified atrial fibrillation (HCC) 07/03/2019  . Atrial flutter (HCC) 07/03/2019  . Pressure injury of skin 07/01/2019  . Wound infection after surgery 06/30/2019  . Hyponatremia 06/30/2019   PCP:  Patient, No Pcp Per Pharmacy:   [id number] - [place],  - [address] AT [facility] OF [address] [place] [address] Phone: [phone] Fax: [phone]     Social Determinants of Health (SDOH) Interventions    Readmission Risk Interventions No flowsheet data found.

## 2019-07-06 NOTE — [place] FL2 (Signed)
[place] MEDICAID FL2 LEVEL OF CARE SCREENING TOOL     IDENTIFICATION  Patient Name: Neil Cordova Birthdate: 01-Oct-1940 Sex: male Admission Date (Current Location): 06/30/2019  [facility] and [place] Number:  Producer, television/film/video and Address:  [facility] [address], [place], [place] [address]      Provider Number: [id number]  Attending Physician Name and Address:  Lisbeth Renshaw, MD  Relative Name and Phone Number:  Gehrig Patras, spouse, [phone]    Current Level of Care: Hospital Recommended Level of Care: Skilled Nursing Facility Prior Approval Number:    Date Approved/Denied: 07/06/19 PASRR Number: [id number]  Discharge Plan: SNF    Current Diagnoses: Patient Active Problem List   Diagnosis Date Noted  . SIRS (systemic inflammatory response syndrome) (HCC)   . Epidural abscess   . Acute combined systolic and diastolic congestive heart failure (HCC)   . Bacteremia   . Acute metabolic encephalopathy 07/03/2019  . Delirium 07/03/2019  . Unspecified atrial fibrillation (HCC) 07/03/2019  . Atrial flutter (HCC) 07/03/2019  . Pressure injury of skin 07/01/2019  . Wound infection after surgery 06/30/2019  . Hyponatremia 06/30/2019    Orientation RESPIRATION BLADDER Height & Weight     Self, Situation, Time, Place  Normal Continent Weight: 147 lb 0.8 oz (66.7 kg) Height:  5\' 10"  (177.8 cm)  BEHAVIORAL SYMPTOMS/MOOD NEUROLOGICAL BOWEL NUTRITION STATUS      Continent Diet(Heart healthy)  AMBULATORY STATUS COMMUNICATION OF NEEDS Skin     Verbally Surgical wounds                       Personal Care Assistance Level of Assistance  Bathing, Feeding, Dressing Bathing Assistance: Maximum assistance Feeding assistance: Limited assistance Dressing Assistance: Maximum assistance     Functional Limitations Info             SPECIAL CARE FACTORS FREQUENCY  PT (By licensed PT), OT (By licensed OT)     PT Frequency: 5x  weekly OT Frequency: 5x weekly            Contractures Contractures Info: Not present    Additional Factors Info  Code Status Code Status Info: Full             Current Medications (07/06/2019):  This is the current hospital active medication list Current Facility-Administered Medications  Medication Dose Route Frequency Provider Last Rate Last Admin  . 0.9 %  sodium chloride infusion  250 mL Intravenous PRN Nahser, 07/08/2019, MD   Stopped at 07/04/19 0815  . 0.9 %  sodium chloride infusion   Intravenous Continuous 07/06/19, PA-C      . acetaminophen (TYLENOL) tablet 650 mg  650 mg Oral Q6H PRN Nahser, Corrin Parker, MD   650 mg at 07/05/19 0215   Or  . acetaminophen (TYLENOL) suppository 650 mg  650 mg Rectal Q6H PRN Nahser, 07/07/19, MD      . bisacodyl (DULCOLAX) suppository 10 mg  10 mg Rectal Daily PRN Nahser, Deloris Ping, MD      . docusate sodium (COLACE) capsule 100 mg  100 mg Oral BID Nahser, Deloris Ping, MD   100 mg at 07/06/19 07/08/19  . fluticasone (FLONASE) 50 MCG/ACT nasal spray 1 spray  1 spray Each Nare Daily PRN [id number], MD      . haloperidol (HALDOL) tablet 1 mg  1 mg Oral Q4H PRN Nahser, Rodolph Bong, MD   1 mg at 07/05/19  2324   Or  . haloperidol lactate (HALDOL) injection 1 mg  1 mg Intramuscular Q4H PRN Nahser, Wonda Cheng, MD      . heparin ADULT infusion 100 units/mL (25000 units/260mL sodium chloride 0.45%)  1,800 Units/hr Intravenous Continuous Consuella Lose, MD 18 mL/hr at 07/06/19 0841 1,800 Units/hr at 07/06/19 0841  . iohexol (OMNIPAQUE) 300 MG/ML solution 50 mL  50 mL Per Tube Once PRN Markus Daft, MD      . iohexol (OMNIPAQUE) 300 MG/ML solution 50 mL  50 mL Per Tube Once PRN Markus Daft, MD      . lidocaine (PF) (XYLOCAINE) 1 % injection   Infiltration PRN Markus Daft, MD   10 mL at 07/05/19 1224  . lisinopril (ZESTRIL) tablet 10 mg  10 mg Oral Daily Sande Rives E, PA-C   10 mg at 07/06/19 0836  . loratadine (CLARITIN) tablet 10 mg  10 mg  Oral Daily PRN Eugenie Filler, MD      . metoprolol succinate (TOPROL-XL) 24 hr tablet 50 mg  50 mg Oral Daily Eugenie Filler, MD   50 mg at 07/06/19 8295  . multivitamin (PROSIGHT) tablet 1 tablet  1 tablet Oral Daily Nahser, Wonda Cheng, MD   1 tablet at 07/06/19 0836  . nafcillin 12 g in sodium chloride 0.9 % 500 mL continuous infusion  12 g Intravenous Q24H Susa Raring, RPH 20.8 mL/hr at 07/05/19 1122 12 g at 07/05/19 1122  . ondansetron (ZOFRAN) tablet 4 mg  4 mg Oral Q6H PRN Nahser, Wonda Cheng, MD       Or  . ondansetron [facility]) injection 4 mg  4 mg Intravenous Q6H PRN Nahser, Wonda Cheng, MD   4 mg at 06/30/19 2244  . oxyCODONE (Oxy IR/ROXICODONE) immediate release tablet 5 mg  5 mg Oral Q4H PRN Nahser, Wonda Cheng, MD   5 mg at 07/05/19 1819  . PARoxetine (PAXIL) tablet 30 mg  30 mg Oral QPM Nahser, Wonda Cheng, MD   30 mg at 07/05/19 1819  . polyethylene glycol (MIRALAX / GLYCOLAX) packet 17 g  17 g Oral Daily PRN Nahser, Wonda Cheng, MD      . potassium chloride SA (KLOR-CON) CR tablet 40 mEq  40 mEq Oral Q4H Eugenie Filler, MD   40 mEq at 07/06/19 [id number]  . sodium chloride flush (NS) 0.9 % injection 3 mL  3 mL Intravenous Q12H Nahser, Wonda Cheng, MD   3 mL at 07/06/19 0844  . sodium chloride flush (NS) 0.9 % injection 3 mL  3 mL Intravenous PRN Nahser, Wonda Cheng, MD      . sodium chloride flush (NS) 0.9 % injection 5 mL  5 mL Intracatheter Q8H Markus Daft, MD   5 mL at 07/06/19 0640  . thiamine tablet 100 mg  100 mg Oral Daily Consuella Lose, MD   100 mg at 07/06/19 0865     Discharge Medications: Please see discharge summary for a list of discharge medications.  Relevant Imaging Results:  Relevant Lab Results:   Additional Information SSN: [id number]  Oretha Milch, LCSW

## 2019-07-06 NOTE — Progress Notes (Signed)
  NEUROSURGERY PROGRESS NOTE   No issues overnight. Pt reports appropriate back pain, no real leg pain. No new N/T/W or difficulty urinating. Ambulating with minimal assistance.  EXAM:  BP 130/85 (BP Location: Left Arm)   Pulse 72   Temp 97.6 F (36.4 C) (Oral)   Resp 20   Ht 5\' 10"  (1.778 m)   Wt 66.7 kg   SpO2 95%   BMI 21.10 kg/m   Awake, alert, oriented  Speech fluent, appropriate  CN grossly intact  5/5 BUE/BLE   IMPRESSION:  79 y.o. male with lumbar infection with MSSA s/p discectomy. Steadily improving on Nafcillin.  PLAN: - Will cont subcutaneous drain - Cont Nafcillin - Cont therapy - suspect he will need CIR v SNF - Cont Toprol for aflutter with outpatient cards f/u

## 2019-07-06 NOTE — Progress Notes (Signed)
ANTICOAGULATION CONSULT NOTE - Follow Up Consult  Pharmacy Consult for IV Heparin Indication: atrial fibrillation  No Known Allergies  Patient Measurements: Height: 5\' 10"  (177.8 cm) Weight: 66.7 kg (147 lb 0.8 oz) IBW/kg (Calculated) : 73 Heparin Dosing Weight: 66.7 kg  Vital Signs: Temp: 97.6 F (36.4 C) (05/30 1103) Temp Source: Oral (05/30 1103) BP: 130/85 (05/30 1103) Pulse Rate: 72 (05/30 1103)  Labs: Recent Labs    07/04/19 0145 07/04/19 0926 07/05/19 0414 07/05/19 0414 07/05/19 1403 07/06/19 0538 07/06/19 1616  HGB 10.8*  --  8.9*  --   --  9.3*  --   HCT 32.3*  --  26.1*  --   --  28.3*  --   PLT 189  --  245  --   --  292  --   HEPARINUNFRC <0.10*   < > 0.25*   < > <0.10* 0.53 0.38  CREATININE 1.00  --  1.12  --   --  1.17  --    < > = values in this interval not displayed.    Estimated Creatinine Clearance: 49.1 mL/min (by C-G formula based on SCr of 1.17 mg/dL).  Assessment: 79 yr old male admitted on 06/30/19 with AMS and wound drainage following L4-5 microdiscectomy at outpt surgical center on 06/17/19; he was diagnosed with lumbar wound infection and MSSA bacteremia (S/P washout on 5/25).   Pt with new atrial fibrillation; pharmacy was consulted to dose heparin (no bolus, due to recent neurosurgery); will use lower heparin level goal (0.3-0.5 units/ml) as well, due to recent neurosurgery.  Heparin level 0.38 on gtt at 1800 units/hr. No issues with line or bleeding noted.  Goal of Therapy:  Heparin level: 0.3-0.5 units/ml  Monitor platelets by anticoagulation protocol: Yes   Plan:  Continue heparin infusion to 1800 units/hr Heparin level with cbc daily  6/25 PharmD., BCPS Clinical Pharmacist 07/06/2019 5:27 PM

## 2019-07-06 NOTE — Progress Notes (Signed)
Subjective: No new complaints   Antibiotics:  Anti-infectives (From admission, onward)   Start     Dose/Rate Route Frequency Ordered Stop   07/04/19 1030  nafcillin 12 g in sodium chloride 0.9 % 500 mL continuous infusion     12 g 20.8 mL/hr over 24 Hours Intravenous Every 24 hours 07/04/19 0851     07/03/19 1600  nafcillin 2 g in sodium chloride 0.9 % 100 mL IVPB  Status:  Discontinued     2 g 200 mL/hr over 30 Minutes Intravenous Every 4 hours 07/03/19 1412 07/04/19 0851   07/01/19 1240  bacitracin 50,000 Units in sodium chloride 0.9 % 500 mL irrigation  Status:  Discontinued       As needed 07/01/19 1240 07/01/19 1245   07/01/19 1030  ceFAZolin (ANCEF) IVPB 2g/100 mL premix  Status:  Discontinued     2 g 200 mL/hr over 30 Minutes Intravenous Every 8 hours 07/01/19 0942 07/03/19 1412   07/01/19 0900  ceFEPIme (MAXIPIME) 2 g in sodium chloride 0.9 % 100 mL IVPB  Status:  Discontinued     2 g 200 mL/hr over 30 Minutes Intravenous Every 12 hours 07/01/19 0837 07/01/19 0942   06/30/19 1430  vancomycin (VANCOREADY) IVPB 1250 mg/250 mL  Status:  Discontinued     1,250 mg 166.7 mL/hr over 90 Minutes Intravenous Every 24 hours 06/30/19 1403 07/01/19 0942   06/30/19 1430  ceFEPIme (MAXIPIME) 2 g in sodium chloride 0.9 % 100 mL IVPB  Status:  Discontinued     2 g 200 mL/hr over 30 Minutes Intravenous Every 12 hours 06/30/19 1403 07/01/19 0837      Medications: Scheduled Meds: . docusate sodium  100 mg Oral BID  . lisinopril  10 mg Oral Daily  . metoprolol succinate  50 mg Oral Daily  . multivitamin  1 tablet Oral Daily  . PARoxetine  30 mg Oral QPM  . potassium chloride  40 mEq Oral Q4H  . sodium chloride flush  3 mL Intravenous Q12H  . sodium chloride flush  5 mL Intracatheter Q8H  . thiamine  100 mg Oral Daily   Continuous Infusions: . sodium chloride Stopped (07/04/19 0815)  . sodium chloride 20 mL/hr at 07/06/19 1000  . heparin 1,800 Units/hr (07/06/19 1000)  .  nafcillin (NAFCIL) continuous infusion 12 g (07/06/19 1056)   PRN Meds:.sodium chloride, acetaminophen **OR** acetaminophen, bisacodyl, fluticasone, haloperidol **OR** haloperidol lactate, iohexol, iohexol, lidocaine (PF), loratadine, ondansetron **OR** ondansetron (ZOFRAN) IV, oxyCODONE, polyethylene glycol, sodium chloride flush    Objective: Weight change:   Intake/Output Summary (Last 24 hours) at 07/06/2019 1329 Last data filed at 07/06/2019 1152 Gross per 24 hour  Intake 2681.59 ml  Output 2091 ml  Net 590.59 ml   Blood pressure 130/85, pulse 72, temperature 97.6 F (36.4 C), temperature source Oral, resp. rate 20, height  (1.778 m), weight 66.7 kg, SpO2 95 %. Temp:  [97.6 F (36.4 C)-99.4 F (37.4 C)] 97.6 F (36.4 C) (05/30 1103) Pulse Rate:  [47-76] 72 (05/30 1103) Resp:  [17-22] 20 (05/30 1103) BP: (122-140)/(57-85) 130/85 (05/30 1103) SpO2:  [93 %-100 %] 95 % (05/30 1103)  Physical Exam: General: Alert and awake, oriented x3, not in any acute distress. HEENT: anicteric sclera, EOMI CVS regular rate, normal NO MGR Chest: , no wheezing, no respiratory distress, ctab Abdomen: soft non-distended,  Extremities: no edema or deformity noted bilaterally Skin: no rashes, drain in place with purulent material seen  Neuro: nonfocal  CBC:    BMET Recent Labs    07/05/19 0414 07/06/19 0538  NA 132* 134*  K 3.4* 3.2*  CL 101 100  CO2 23 25  GLUCOSE 204* 172*  BUN 26* 23  CREATININE 1.12 1.17  CALCIUM 7.5* 7.7*     Liver Panel  No results for input(s): PROT, ALBUMIN, AST, ALT, ALKPHOS, BILITOT, BILIDIR, IBILI in the last 72 hours.     Sedimentation Rate No results for input(s): ESRSEDRATE in the last 72 hours. C-Reactive Protein No results for input(s): CRP in the last 72 hours.  Micro Results: Recent Results (from the past 720 hour(s))  Blood Culture (routine x 2)     Status: Abnormal   Collection Time: 06/30/19 10:55 AM   Specimen: BLOOD RIGHT  FOREARM  Result Value Ref Range Status   Specimen Description BLOOD RIGHT FOREARM  Final   Special Requests   Final    BOTTLES DRAWN AEROBIC AND ANAEROBIC Blood Culture adequate volume   Culture  Setup Time   Final    GRAM POSITIVE COCCI IN CLUSTERS IN BOTH AEROBIC AND ANAEROBIC BOTTLES CRITICAL RESULT CALLED TO, READ BACK BY AND VERIFIED WITH: K. AMEND,PHARMD 0210 07/01/2019 T. TYSOR Performed at [facility], [address] [place], [place] [address]    Culture STAPHYLOCOCCUS AUREUS (A)  Final   Report Status 07/03/2019 FINAL  Final   Organism ID, Bacteria STAPHYLOCOCCUS AUREUS  Final      Susceptibility   Staphylococcus aureus - MIC*    CIPROFLOXACIN <=0.5 SENSITIVE Sensitive     ERYTHROMYCIN RESISTANT Resistant     GENTAMICIN <=0.5 SENSITIVE Sensitive     OXACILLIN 0.5 SENSITIVE Sensitive     TETRACYCLINE <=1 SENSITIVE Sensitive     VANCOMYCIN 1 SENSITIVE Sensitive     TRIMETH/SULFA <=10 SENSITIVE Sensitive     CLINDAMYCIN RESISTANT Resistant     RIFAMPIN <=0.5 SENSITIVE Sensitive     Inducible Clindamycin POSITIVE Resistant     * STAPHYLOCOCCUS AUREUS  Urine culture     Status: Abnormal   Collection Time: 06/30/19 10:55 AM   Specimen: In/Out Cath Urine  Result Value Ref Range Status   Specimen Description IN/OUT CATH URINE  Final   Special Requests   Final    NONE Performed at [facility], [address], [place], [place] [address]    Culture 60,000 COLONIES/mL STAPHYLOCOCCUS EPIDERMIDIS (A)  Final   Report Status 07/02/2019 FINAL  Final   Organism ID, Bacteria STAPHYLOCOCCUS EPIDERMIDIS (A)  Final      Susceptibility   Staphylococcus epidermidis - MIC*    CIPROFLOXACIN <=0.5 SENSITIVE Sensitive     GENTAMICIN <=0.5 SENSITIVE Sensitive     NITROFURANTOIN <=16 SENSITIVE Sensitive     OXACILLIN >=4 RESISTANT Resistant     TETRACYCLINE 2 SENSITIVE Sensitive     VANCOMYCIN 2 SENSITIVE Sensitive     TRIMETH/SULFA <=10 SENSITIVE Sensitive     CLINDAMYCIN  <=0.25 SENSITIVE Sensitive     RIFAMPIN <=0.5 SENSITIVE Sensitive     Inducible Clindamycin NEGATIVE Sensitive     * 60,000 COLONIES/mL STAPHYLOCOCCUS EPIDERMIDIS  Blood Culture ID Panel (Reflexed)     Status: Abnormal   Collection Time: 06/30/19 10:55 AM  Result Value Ref Range Status   Enterococcus species NOT DETECTED NOT DETECTED Final   Listeria monocytogenes NOT DETECTED NOT DETECTED Final   Staphylococcus species DETECTED (A) NOT DETECTED Final    Comment: CRITICAL RESULT CALLED TO, READ BACK BY AND  VERIFIED WITH: K. AMEND,PHARMD 0210 07/01/2019 T. TYSOR    Staphylococcus aureus (BCID) DETECTED (A) NOT DETECTED Final    Comment: Methicillin (oxacillin) susceptible Staphylococcus aureus (MSSA). Preferred therapy is anti staphylococcal beta lactam antibiotic (Cefazolin or Nafcillin), unless clinically contraindicated. CRITICAL RESULT CALLED TO, READ BACK BY AND VERIFIED WITH: K. AMEND,PHARMD 0210 07/01/2019 T. TYSOR    Methicillin resistance NOT DETECTED NOT DETECTED Final   Streptococcus species NOT DETECTED NOT DETECTED Final   Streptococcus agalactiae NOT DETECTED NOT DETECTED Final   Streptococcus pneumoniae NOT DETECTED NOT DETECTED Final   Streptococcus pyogenes NOT DETECTED NOT DETECTED Final   Acinetobacter baumannii NOT DETECTED NOT DETECTED Final   Enterobacteriaceae species NOT DETECTED NOT DETECTED Final   Enterobacter cloacae complex NOT DETECTED NOT DETECTED Final   Escherichia coli NOT DETECTED NOT DETECTED Final   Klebsiella oxytoca NOT DETECTED NOT DETECTED Final   Klebsiella pneumoniae NOT DETECTED NOT DETECTED Final   Proteus species NOT DETECTED NOT DETECTED Final   Serratia marcescens NOT DETECTED NOT DETECTED Final   Haemophilus influenzae NOT DETECTED NOT DETECTED Final   Neisseria meningitidis NOT DETECTED NOT DETECTED Final   Pseudomonas aeruginosa NOT DETECTED NOT DETECTED Final   Candida albicans NOT DETECTED NOT DETECTED Final   Candida glabrata NOT  DETECTED NOT DETECTED Final   Candida krusei NOT DETECTED NOT DETECTED Final   Candida parapsilosis NOT DETECTED NOT DETECTED Final   Candida tropicalis NOT DETECTED NOT DETECTED Final    Comment: Performed at [facility], [address] [place], [place] [address]  SARS Coronavirus 2 by RT PCR (hospital order, performed in [facility] hospital lab) Nasopharyngeal Nasopharyngeal Swab     Status: None   Collection Time: 06/30/19 11:49 AM   Specimen: Nasopharyngeal Swab  Result Value Ref Range Status   SARS Coronavirus 2 NEGATIVE NEGATIVE Final    Comment: (NOTE) SARS-CoV-2 target nucleic acids are NOT DETECTED. The SARS-CoV-2 RNA is generally detectable in upper and lower respiratory specimens during the acute phase of infection. The lowest concentration of SARS-CoV-2 viral copies this assay can detect is 250 copies / mL. A negative result does not preclude SARS-CoV-2 infection and should not be used as the sole basis for treatment or other patient management decisions.  A negative result may occur with improper specimen collection / handling, submission of specimen other than nasopharyngeal swab, presence of viral mutation(s) within the areas targeted by this assay, and inadequate number of viral copies (<250 copies / mL). A negative result must be combined with clinical observations, patient history, and epidemiological information. Fact Sheet for Patients:   BoilerBrush.com.cy Fact Sheet for Healthcare Providers: https://pope.com/ This test is not yet approved or cleared  by the [place] FDA and has been authorized for detection and/or diagnosis of SARS-CoV-2 by FDA under an Emergency Use Authorization (EUA).  This EUA will remain in effect (meaning this test can be used) for the duration of the COVID-19 declaration under Section 564(b)(1) of the Act, 21 U.S.C. section 360bbb-3(b)(1), unless the authorization is terminated  or revoked sooner. Performed at [facility], [address] [place], [place] [address]   Blood Culture (routine x 2)     Status: Abnormal   Collection Time: 06/30/19 12:00 PM   Specimen: BLOOD RIGHT WRIST  Result Value Ref Range Status   Specimen Description BLOOD RIGHT WRIST  Final   Special Requests   Final    BOTTLES DRAWN AEROBIC AND ANAEROBIC Blood Culture results may not  be optimal due to an inadequate volume of blood received in culture bottles   Culture  Setup Time   Final    GRAM POSITIVE COCCI IN CLUSTERS IN BOTH AEROBIC AND ANAEROBIC BOTTLES CRITICAL RESULT CALLED TO, READ BACK BY AND VERIFIED WITH: K. AMEND,PHARMD 0210 07/01/2019 T. TYSOR    Culture (A)  Final    STAPHYLOCOCCUS AUREUS SUSCEPTIBILITIES PERFORMED ON PREVIOUS CULTURE WITHIN THE LAST 5 DAYS. Performed at [facility], [address] [place], [place] [address]    Report Status 07/03/2019 FINAL  Final  Aerobic/Anaerobic Culture (surgical/deep wound)     Status: None (Preliminary result)   Collection Time: 06/30/19  3:42 PM   Specimen: Wound  Result Value Ref Range Status   Specimen Description WOUND SITE NOT SPECIFIED  Final   Special Requests NO ANA SWAB  Final   Gram Stain   Final    NO WBC SEEN MODERATE GRAM POSITIVE COCCI IN PAIRS IN CLUSTERS Performed at [facility], [address], [place], [place] [address]    Culture   Final    ABUNDANT STAPHYLOCOCCUS AUREUS NO ANAEROBES ISOLATED; CULTURE IN PROGRESS FOR 5 DAYS    Report Status PENDING  Incomplete   Organism ID, Bacteria STAPHYLOCOCCUS AUREUS  Final      Susceptibility   Staphylococcus aureus - MIC*    CIPROFLOXACIN <=0.5 SENSITIVE Sensitive     ERYTHROMYCIN RESISTANT Resistant     GENTAMICIN <=0.5 SENSITIVE Sensitive     OXACILLIN <=0.25 SENSITIVE Sensitive     TETRACYCLINE <=1 SENSITIVE Sensitive     VANCOMYCIN <=0.5 SENSITIVE Sensitive     TRIMETH/SULFA <=10 SENSITIVE Sensitive     CLINDAMYCIN RESISTANT Resistant      RIFAMPIN <=0.5 SENSITIVE Sensitive     Inducible Clindamycin POSITIVE Resistant     * ABUNDANT STAPHYLOCOCCUS AUREUS  MRSA PCR Screening     Status: None   Collection Time: 06/30/19 10:27 PM   Specimen: Nasopharyngeal  Result Value Ref Range Status   MRSA by PCR NEGATIVE NEGATIVE Final    Comment:        The GeneXpert MRSA Assay (FDA approved for NASAL specimens only), is one component of a comprehensive MRSA colonization surveillance program. It is not intended to diagnose MRSA infection nor to guide or monitor treatment for MRSA infections. Performed at [facility], [address], [place], [place] [address]   Aerobic/Anaerobic Culture (surgical/deep wound)     Status: None   Collection Time: 07/01/19 11:54 AM   Specimen: Wound  Result Value Ref Range Status   Specimen Description WOUND  Final   Special Requests NONE  Final   Gram Stain   Final    FEW WBC PRESENT, PREDOMINANTLY PMN ABUNDANT GRAM POSITIVE COCCI    Culture   Final    ABUNDANT STAPHYLOCOCCUS AUREUS NO ANAEROBES ISOLATED Performed at [facility], [address] [place], [place] [address]    Report Status 07/06/2019 FINAL  Final   Organism ID, Bacteria STAPHYLOCOCCUS AUREUS  Final      Susceptibility   Staphylococcus aureus - MIC*    CIPROFLOXACIN <=0.5 SENSITIVE Sensitive     ERYTHROMYCIN RESISTANT Resistant     GENTAMICIN <=0.5 SENSITIVE Sensitive     OXACILLIN 0.5 SENSITIVE Sensitive     TETRACYCLINE <=1 SENSITIVE Sensitive     VANCOMYCIN 1 SENSITIVE Sensitive     TRIMETH/SULFA <=10 SENSITIVE Sensitive     CLINDAMYCIN RESISTANT Resistant     RIFAMPIN <=  0.5 SENSITIVE Sensitive     Inducible Clindamycin POSITIVE Resistant     * ABUNDANT STAPHYLOCOCCUS AUREUS  Culture, blood (routine x 2)     Status: None (Preliminary result)   Collection Time: 07/02/19 12:06 PM   Specimen: BLOOD  Result Value Ref Range Status   Specimen Description BLOOD RIGHT ANTECUBITAL  Final   Special  Requests   Final    BOTTLES DRAWN AEROBIC AND ANAEROBIC Blood Culture adequate volume   Culture   Final    NO GROWTH 4 DAYS Performed at [facility], [place] [address] [place], [place] [address]    Report Status PENDING  Incomplete  Culture, blood (routine x 2)     Status: None (Preliminary result)   Collection Time: 07/02/19 12:11 PM   Specimen: BLOOD RIGHT HAND  Result Value Ref Range Status   Specimen Description BLOOD RIGHT HAND  Final   Special Requests   Final    BOTTLES DRAWN AEROBIC AND ANAEROBIC Blood Culture adequate volume   Culture   Final    NO GROWTH 4 DAYS Performed at [facility], [place] [address], [place], [place] [address]    Report Status PENDING  Incomplete  Culture, blood (routine x 2)     Status: None (Preliminary result)   Collection Time: 07/03/19  2:33 PM   Specimen: BLOOD RIGHT HAND  Result Value Ref Range Status   Specimen Description BLOOD RIGHT HAND  Final   Special Requests   Final    BOTTLES DRAWN AEROBIC AND ANAEROBIC Blood Culture adequate volume   Culture   Final    NO GROWTH 3 DAYS Performed at [facility], [place] [address], [place], [place] [address]    Report Status PENDING  Incomplete  Culture, blood (routine x 2)     Status: None (Preliminary result)   Collection Time: 07/03/19  2:37 PM   Specimen: BLOOD RIGHT ARM  Result Value Ref Range Status   Specimen Description BLOOD RIGHT ARM  Final   Special Requests   Final    BOTTLES DRAWN AEROBIC AND ANAEROBIC Blood Culture results may not be optimal due to an excessive volume of blood received in culture bottles   Culture   Final    NO GROWTH 3 DAYS Performed at [facility], [place] [address] [place], [place] [address]    Report Status PENDING  Incomplete  Aerobic/Anaerobic Culture (surgical/deep wound)     Status: None (Preliminary result)   Collection Time: 07/05/19 12:38 PM   Specimen: Abscess  Result Value Ref Range Status   Specimen Description ABSCESS  Final    Special Requests NONE  Final   Gram Stain   Final    MODERATE WBC PRESENT,BOTH PMN AND MONONUCLEAR MODERATE GRAM POSITIVE COCCI IN PAIRS IN CLUSTERS    Culture   Final    ABUNDANT STAPHYLOCOCCUS AUREUS SUSCEPTIBILITIES TO FOLLOW Performed at [facility], [place] [address], [place], [place] [address]    Report Status PENDING  Incomplete    Studies/Results: MR BRAIN WO CONTRAST  Result Date: 07/04/2019 CLINICAL DATA:  Encephalopathy. EXAM: MRI HEAD WITHOUT CONTRAST TECHNIQUE: Multiplanar, multiecho pulse sequences of the brain and surrounding structures were obtained without intravenous contrast. COMPARISON:  Head CT Jul 03, 2019 FINDINGS: Brain: No acute infarction, hemorrhage, hydrocephalus, extra-axial collection or mass lesion. Scattered foci of T2 hyperintensity are seen within the white matter of the cerebral hemispheres, nonspecific, most likely related to chronic small vessel ischemia.  Prominence of the supratentorial ventricles and cerebral sulci reflecting parenchymal volume loss. Vascular: Normal flow voids. Skull and upper cervical spine: Normal marrow signal. Sinuses/Orbits: Bilateral lens surgery. Paranasal sinuses are clear. IMPRESSION: 1. No acute intracranial abnormality. 2. Mild chronic micro body changes of the white matter. 3. Mild parenchymal volume loss. Electronically Signed   By: Baldemar Lenis M.D.   On: 07/04/2019 14:53   MR Lumbar Spine W Wo Contrast  Result Date: 07/04/2019 CLINICAL DATA:  Left laminectomy L4-5 06/17/2019. Postoperative wound infection. Positive blood cultures. EXAM: MRI LUMBAR SPINE WITHOUT AND WITH CONTRAST TECHNIQUE: Multiplanar and multiecho pulse sequences of the lumbar spine were obtained without and with intravenous contrast. CONTRAST:  6.7mL GADAVIST GADOBUTROL 1 MMOL/ML IV SOLN COMPARISON:  Lumbar MRI 06/05/2019 FINDINGS: Segmentation:  Normal Alignment:  Normal Vertebrae:  Negative for fracture. Increased fluid in the disc  space at L2-3 and L3-4 compared to the prior study. There is mild associated bone marrow edema and enhancement at L2-3 and L3-4. Possible disc space infection. Conus medullaris and cauda equina: Conus extends to the L1 level. Conus and cauda equina appear normal. Paraspinal and other soft tissues: Postsurgical wound in the midline in the lower lumbar spine. Large subcutaneous fluid collection to the left of the wound extending from L2 through L5. This measures approximately 7.1 x 2.9 x 7.5 cm. Probable abscess. Edema in the medial psoas muscle bilaterally which was not present previously. 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 Ventral epidural fluid collection extending from T12 to L2-3 compatible with epidural abscess. Additional ventral epidural fluid collection on the left at L4-L5 and posteriorly at L5-S1 compatible with additional epidural abscess. Disc levels: L1-2: Ventral epidural abscess causing mild spinal stenosis. L2-3: Increased fluid in the disc space worrisome for disc space infection. Ventral epidural abscess extending left midline. Bilateral facet hypertrophy and moderate to severe spinal stenosis which has progressed in the interval. L3-4: Fluid in the disc space. There is edema in the adjacent psoas muscle bilaterally. Broad-based disc protrusion and spurring with bilateral facet hypertrophy. 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 Left laminectomy. Bilateral facet degeneration. Moderate subarticular stenosis bilaterally. L5-S1: Left posterior epidural abscess. IMPRESSION: 1. Postop laminectomy on the left at L4-5 with adequate decompression of the spinal canal 2. 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 Additional epidural abscess on the left at L4-5 and posterolaterally on the left at L5-S1. 3. Large subcutaneous fluid collection on the left in the lumbar spine compatible with  abscess. 4. Increased fluid in the disc space at L2-3 and L3-4 with edema in the adjacent psoas muscle. These disc spaces may be infected. These results were called by telephone at the time of interpretation on 07/04/2019 at 4:12 pm to provider Neurosurgery PA , who verbally acknowledged these results. Electronically Signed   By: Marlan Palau M.D.   On: 07/04/2019 16:12   IR US Guide Bx Asp/Drain  Result Date: 07/05/2019 INDICATION: 79 year old with a postoperative lumbar spine infection. Patient has a large subcutaneous fluid collection on the left side of the back and concerning for an abscess. EXAM: ULTRASOUND-GUIDED PLACEMENT OF A DRAINAGE CATHETER WITHIN THE SUBCUTANEOUS ABSCESS MEDICATIONS: None ANESTHESIA/SEDATION: None COMPLICATIONS: None immediate. PROCEDURE: Informed written consent was obtained from the patient after a thorough discussion of the procedural risks, benefits and alternatives. All questions were addressed. Maximal Sterile Barrier Technique was utilized including caps, mask, sterile gowns, sterile  gloves, sterile drape, hand hygiene and skin antiseptic. A timeout was performed prior to the initiation of the procedure. Patient was placed prone. The left back subcutaneous tissues was evaluated with ultrasound. Large hypoechoic fluid collection was identified lateral to the wound dressings. Skin was prepped with chlorhexidine and sterile field was created. Skin and soft tissues were anesthetized with 1% lidocaine. Small incision was made. Using ultrasound guidance, an 18 gauge needle was directed into the fluid collection but no fluid could be aspirated. Therefore, the 18 gauge needle was removed and a 5 [place]French Yueh catheter was directed into the fluid collection using ultrasound guidance. Again, no significant fluid could be aspirated. Superstiff Amplatz wire was easily advanced into the collection and confirmed to be within the fluid with ultrasound. Tract was dilated to accommodate a 12  [place] multipurpose drain. Approximately 22 mL of yellow purulent fluid was then removed. Catheter was manipulated within the collection order to improve drainage. Catheter was sutured to skin. Catheter was flushed with sterile saline and attached to a suction bulb. FINDINGS: Hypoechoic fluid collection in the left back subcutaneous tissues. Findings correspond with the large fluid collection on recent MRI. 12 French drain was placed within the collection and 22 mL of purulent fluid was removed. IMPRESSION: Ultrasound-guided placement of a drainage catheter within the left back subcutaneous abscess. Fluid was sent for culture. Electronically Signed   By: Richarda OverlieAdam  Henn M.D.   On: 07/05/2019 13:48      Assessment/Plan:  INTERVAL HISTORY:    Eventual radiology have drained and left drain in place and superficial abscess is already growing Staph aureus and culture  Principal Problem:   Wound infection after surgery Active Problems:   Hyponatremia   Pressure injury of skin   Acute metabolic encephalopathy   Delirium   Atrial flutter (HCC)   Acute combined systolic and diastolic congestive heart failure (HCC)   Bacteremia   SIRS (systemic inflammatory response syndrome) (HCC)   Epidural abscess    Neil DienerJerry Winborne is a 79 y.o. 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  Apparently encephalopathic at the end of last week but is become progressively more lucid.  He is undergone IR guided drainage of superficial abscess that is already growing Staph aureus and less than a day  --continue Nafcillin   He is going to need a protracted course of antibiotics and I would strongly recommend repeat MRI prior to end of his systemic abx  Dr Drue SecondSnider will be back tomorrow.     LOS: 6 days   Acey LavCornelius Van Dam 07/06/2019, 1:29 PM

## 2019-07-06 NOTE — Progress Notes (Signed)
ANTICOAGULATION CONSULT NOTE - Follow Up Consult  Pharmacy Consult for IV Heparin Indication: atrial fibrillation  No Known Allergies  Patient Measurements: Height: 5\' 10"  (177.8 cm) Weight: 66.7 kg (147 lb 0.8 oz) IBW/kg (Calculated) : 73 Heparin Dosing Weight: 66.7 kg  Vital Signs: Temp: 98.8 F (37.1 C) (05/30 0330) Temp Source: Oral (05/30 0330) BP: 137/73 (05/30 0330) Pulse Rate: 67 (05/30 0330)  Labs: Recent Labs    07/04/19 0145 07/04/19 0926 07/05/19 0414 07/05/19 1403 07/06/19 0538  HGB 10.8*  --  8.9*  --  9.3*  HCT 32.3*  --  26.1*  --  28.3*  PLT 189  --  245  --  292  HEPARINUNFRC <0.10*   < > 0.25* <0.10* 0.53  CREATININE 1.00  --  1.12  --  1.17   < > = values in this interval not displayed.    Estimated Creatinine Clearance: 49.1 mL/min (by C-G formula based on SCr of 1.17 mg/dL).  Assessment: 79 yr old male admitted on 06/30/19 with AMS and wound drainage following L4-5 microdiscectomy at outpt surgical center on 06/17/19; he was diagnosed with lumbar wound infection and MSSA bacteremia (S/P washout on 5/25).   Pt with new atrial fibrillation; pharmacy was consulted to dose heparin (no bolus, due to recent neurosurgery); will use lower heparin level goal (0.3-0.5 units/ml) as well, due to recent neurosurgery.  Heparin level 0.53 on gtt at 1850 units/hr. No issues with line or bleeding reported per RN.  Goal of Therapy:  Heparin level: 0.3-0.5 units/ml  Monitor platelets by anticoagulation protocol: Yes   Plan:  Decrease heparin infusion to 1800 units/hr Check 8 hr heparin level  6/25, PharmD, [facility] Clinical Pharmacist Please see AMION for all Pharmacists' Contact Phone Numbers 07/06/2019, 7:35 AM

## 2019-07-07 LAB — CULTURE, BLOOD (ROUTINE X 2)
Culture: NO GROWTH
Culture: NO GROWTH
Special Requests: ADEQUATE
Special Requests: ADEQUATE

## 2019-07-07 LAB — MAGNESIUM: Magnesium: 1.9 mg/dL (ref 1.7–2.4)

## 2019-07-07 LAB — BASIC METABOLIC PANEL
Anion gap: 8 (ref 5–15)
BUN: 21 mg/dL (ref 8–23)
CO2: 25 mmol/L (ref 22–32)
Calcium: 7.7 mg/dL — ABNORMAL LOW (ref 8.9–10.3)
Chloride: 104 mmol/L (ref 98–111)
Creatinine, Ser: 1.16 mg/dL (ref 0.61–1.24)
GFR calc Af Amer: >60 mL/min (ref >60)
GFR calc non Af Amer: 60 mL/min — ABNORMAL LOW (ref >60)
Glucose, Bld: 149 mg/dL — ABNORMAL HIGH (ref 70–99)
Potassium: 3.6 mmol/L (ref 3.5–5.1)
Sodium: 137 mmol/L (ref 135–145)

## 2019-07-07 LAB — CBC WITH DIFFERENTIAL/PLATELET
Abs Immature Granulocytes: 0.1 10*3/uL — ABNORMAL HIGH (ref 0.00–0.07)
Basophils Absolute: 0 10*3/uL (ref 0.0–0.1)
Basophils Relative: 0 %
Eosinophils Absolute: 0.1 10*3/uL (ref 0.0–0.5)
Eosinophils Relative: 1 %
HCT: 25.6 % — ABNORMAL LOW (ref 39.0–52.0)
Hemoglobin: 8.4 g/dL — ABNORMAL LOW (ref 13.0–17.0)
Immature Granulocytes: 1 %
Lymphocytes Relative: 13 %
Lymphs Abs: 1.2 10*3/uL (ref 0.7–4.0)
MCH: 30 pg (ref 26.0–34.0)
MCHC: 32.8 g/dL (ref 30.0–36.0)
MCV: 91.4 fL (ref 80.0–100.0)
Monocytes Absolute: 0.7 10*3/uL (ref 0.1–1.0)
Monocytes Relative: 7 %
Neutro Abs: 7.6 10*3/uL (ref 1.7–7.7)
Neutrophils Relative %: 78 %
Platelets: 271 10*3/uL (ref 150–400)
RBC: 2.8 MIL/uL — ABNORMAL LOW (ref 4.22–5.81)
RDW: 15.3 % (ref 11.5–15.5)
WBC: 9.6 10*3/uL (ref 4.0–10.5)
nRBC: 0 % (ref 0.0–0.2)

## 2019-07-07 LAB — HEPARIN LEVEL (UNFRACTIONATED): Heparin Unfractionated: 0.49 IU/mL (ref 0.30–0.70)

## 2019-07-07 LAB — AEROBIC/ANAEROBIC CULTURE W GRAM STAIN (SURGICAL/DEEP WOUND): Gram Stain: NONE SEEN

## 2019-07-07 MED ORDER — POTASSIUM CHLORIDE CRYS ER 20 MEQ PO TBCR
40.0000 meq | EXTENDED_RELEASE_TABLET | Freq: Once | ORAL | Status: AC
  Administered 2019-07-07: 40 meq via ORAL
  Filled 2019-07-07: qty 2

## 2019-07-07 NOTE — Progress Notes (Signed)
Physical Therapy Treatment Patient Details Name: Neil Cordova MRN: [id number] DOB: 12/18/1940 Today's Date: 07/07/2019    History of Present Illness 79 yo male L4-5 microdiscectomy on 5/11 noted to have fever, purulent drainage, cills an swelling. On 5/21 taken to ED due to confusion left AMA per notes.On 5/24 confusion HTN fevers arrived via ambulance from home. Blood cx grew Mssa and taken to OR 5/25 for wash out with Dr Kathyrn Sheriff. PMH depression HTN    PT Comments    Patient progressing much with mobility this session met all PT goals and demonstrated improved activity tolerance, though still with some pain.  He is still unsafe due to cognitive deficits not realizing he was in [place], but knew he was at a hospital.  He may still need some form of post-acute inpatient rehab if wife unable to assist for safety due to cognitive issues.  He is mobilizing currently with minguard to supervision assist and is too high level for CIR.  PT to continue to follow.  Did note he had a good amount of drainage from the back dressing and had to change it and drainage bulb full as well; RN informed.    Follow Up Recommendations  SNF     Equipment Recommendations       Recommendations for Other Services       Precautions / Restrictions Precautions Precautions: Back Restrictions Weight Bearing Restrictions: No    Mobility  Bed Mobility Overal bed mobility: Needs Assistance Bed Mobility: Sit to Supine;Rolling;Sidelying to Sit Rolling: Supervision Sidelying to sit: Min guard   Sit to supine: Min assist   General bed mobility comments: cues for technique to come up to sit, to supine pt did not follow cues for side first and grimacing/moaning as a result of poor techinque  Transfers Overall transfer level: Needs assistance Equipment used: Rolling walker (2 wheeled) Transfers: Sit to/from Stand Sit to Stand: Supervision         General transfer comment: uses RW to  stand  Ambulation/Gait Ambulation/Gait assistance: Min Gaffer (Feet): 600 Feet Assistive device: Rolling walker (2 wheeled) Gait Pattern/deviations: Step-through pattern;Decreased stride length     General Gait Details: walked several laps on unit, good technique with RW, cues for direction each time   Stairs             Wheelchair Mobility    Modified Rankin (Stroke Patients Only)       Balance Overall balance assessment: Needs assistance   Sitting balance-Leahy Scale: Good       Standing balance-Leahy Scale: Fair Standing balance comment: standing on his own to wash hands, brush teeth                            Cognition Arousal/Alertness: Awake/alert Behavior During Therapy: WFL for tasks assessed/performed Overall Cognitive Status: Impaired/Different from baseline Area of Impairment: Orientation;Safety/judgement                 Orientation Level: Disoriented to;Place;Time Current Attention Level: Selective Memory: Decreased recall of precautions;Decreased short-term memory Following Commands: Follows one step commands consistently Safety/Judgement: Decreased awareness of deficits;Decreased awareness of safety            Exercises      General Comments General comments (skin integrity, edema, etc.): on RA, pt requesting tooth brush prior to ambulation, then brushed teeth again after toileting in bathroom      Pertinent Vitals/Pain Pain Assessment: Faces Faces Pain Scale:  Hurts whole lot Pain Location: back Pain Descriptors / Indicators: Grimacing Pain Intervention(s): Monitored during session;Repositioned    Home Living                      Prior Function            PT Goals (current goals can now be found in the care plan section) Progress towards PT goals: Goals met and updated - see care plan    Frequency    Min 5X/week      PT Plan Current plan remains appropriate     Co-evaluation              AM-PAC PT "6 Clicks" Mobility   Outcome Measure  Help needed turning from your back to your side while in a flat bed without using bedrails?: None Help needed moving from lying on your back to sitting on the side of a flat bed without using bedrails?: A Little Help needed moving to and from a bed to a chair (including a wheelchair)?: A Little Help needed standing up from a chair using your arms (e.g., wheelchair or bedside chair)?: None Help needed to walk in hospital room?: A Little Help needed climbing 3-5 steps with a railing? : A Lot 6 Click Score: 19    End of Session   Activity Tolerance: Patient tolerated treatment well Patient left: in bed;with call bell/phone within reach;with bed alarm set   PT Visit Diagnosis: Unsteadiness on feet (R26.81);Muscle weakness (generalized) (M62.81);Other symptoms and signs involving the nervous system (R29.898);Pain     Time: 0940-0050 PT Time Calculation (min) (ACUTE ONLY): 36 min  Charges:  $Gait Training: 8-22 mins $Therapeutic Activity: 8-22 mins                     [place] [phone] 07/07/2019    Reginia Naas 07/07/2019, 5:49 PM

## 2019-07-07 NOTE — Progress Notes (Signed)
Inpatient Rehabilitation Admissions Coordinator  Inpatient rehab consult received. I met with patient at bedside for rehab assessment. We discussed goals and expectations of an inpt rehab admit that I await further therapy assessment this week to assess if CIR vs SNF most appropriate. I contacted his wife, [name], by phone and discussed the same workup. I will meet with wife and patient tomorrow at 4 pm to further define rehab venue.  Danne Baxter, RN, MSN Rehab Admissions Coordinator [phone] 07/07/2019 2:35 PM

## 2019-07-07 NOTE — Progress Notes (Signed)
ANTICOAGULATION CONSULT NOTE - Follow Up Consult  Pharmacy Consult for IV Heparin Indication: atrial fibrillation  No Known Allergies  Patient Measurements: Height: 5\' 10"  (177.8 cm) Weight: 66.7 kg (147 lb 0.8 oz) IBW/kg (Calculated) : 73 Heparin Dosing Weight: 66.7 kg  Vital Signs: Temp: 98 F (36.7 C) (05/31 0725) Temp Source: Oral (05/31 0725) BP: 149/66 (05/31 0725) Pulse Rate: 69 (05/31 0725)  Labs: Recent Labs    07/05/19 0414 07/05/19 1403 07/06/19 0538 07/06/19 1616 07/07/19 0137  HGB 8.9*  --  9.3*  --  8.4*  HCT 26.1*  --  28.3*  --  25.6*  PLT 245  --  292  --  271  HEPARINUNFRC 0.25*   < > 0.53 0.38 0.49  CREATININE 1.12  --  1.17  --  1.16   < > = values in this interval not displayed.    Estimated Creatinine Clearance: 49.5 mL/min (by C-G formula based on SCr of 1.16 mg/dL).  Assessment: 79 yr old male admitted on 06/30/19 with AMS and wound drainage following L4-5 microdiscectomy at outpt surgical center on 06/17/19; he was diagnosed with lumbar wound infection and MSSA bacteremia (S/P washout on 5/25).   Pt with new atrial fibrillation; pharmacy was consulted to dose heparin (no bolus due to recent neurosurgery); will use lower heparin level goal (0.3-0.5 units/ml), as well, due to recent neurosurgery.  Heparin level remains therapeutic at 0.49. No active bleed issues reported.  Goal of Therapy:  Heparin level: 0.3-0.5 units/ml  Monitor platelets by anticoagulation protocol: Yes   Plan:  Continue heparin infusion to 1800 units/hr Monitor daily heparin level and CBC, s/sx bleeding   6/25, PharmD, BCPS Please check AMION for all [facility] Pharmacy contact numbers Clinical Pharmacist 07/07/2019 8:18 AM

## 2019-07-07 NOTE — Progress Notes (Signed)
Subjective: Patient reports still quite a bit of pain  Objective: Vital signs in last 24 hours: Temp:  [98 F (36.7 C)-100.9 F (38.3 C)] 98.4 F (36.9 C) (05/31 1307) Pulse Rate:  [67-86] 67 (05/31 1307) Resp:  [18-20] 20 (05/31 1307) BP: (112-149)/(51-67) 133/56 (05/31 1307) SpO2:  [92 %-100 %] 95 % (05/31 1307)  Intake/Output from previous day: 05/30 0701 - 05/31 0700 In: 1942.7 [P.O.:480; I.V.:742.9; IV Piggyback:709.9] Out: 1596 [Urine:1550; Drains:45; Stool:1] Intake/Output this shift: Total I/O In: 360 [P.O.:360] Out: 766 [Urine:725; Drains:40; Stool:1]  Physical Exam: Full strength lower extremities, drain patent.  Lab Results: Recent Labs    07/06/19 0538 07/07/19 0137  WBC 11.5* 9.6  HGB 9.3* 8.4*  HCT 28.3* 25.6*  PLT 292 271   BMET Recent Labs    07/06/19 0538 07/07/19 0137  NA 134* 137  K 3.2* 3.6  CL 100 104  CO2 25 25  GLUCOSE 172* 149*  BUN 23 21  CREATININE 1.17 1.16  CALCIUM 7.7* 7.7*    Studies/Results: No results found.  Assessment/Plan: Patient on ABX for MSSA.  Will likely need PICC line placed.    LOS: 7 days    Dorian Heckle, MD 07/07/2019, 2:20 PM

## 2019-07-07 NOTE — Progress Notes (Signed)
PROGRESS NOTE/ Consult    Neil Cordova  WJX:[id number] DOB: 1940/08/05 DOA: 06/30/2019 PCP: Patient, No Pcp Per    Chief Complaint  Patient presents with  . Fever  . Altered Mental Status    Brief Narrative:  Patient was initially consulted on May 24 for assistance in medical management.  At that point patient had a postoperative wound infection, status post left L4-L5 microdissection on 5/11.  He also had metabolic encephalopathy, hypotension, hyponatremia and acute kidney injury.  Recommendations were to continue close neurologic monitoring, continue intravenous antibiotics and IV fluids.  His blood cultures grew MSSA and patient was taken to the OR on 5/25 for washout, finding a deep surgical site infection that did not track into the epidural space.  Infectious disease was consulted, recommendations to change antibiotic therapy to cefazolin and obtain further cardiac images to rule out endocarditis.  His echocardiogram performed today had no evidence of valvular vegetations, and the plan is to obtain a transesophageal echocardiogram in the morning.  Patient continued to be encephalopathic and not back to baseline, TRH was reconsulted for further recommendations.   Assessment & Plan:   Principal Problem:   Wound infection after surgery Active Problems:   Hyponatremia   Pressure injury of skin   Acute metabolic encephalopathy   Delirium   Atrial flutter (HCC)   Acute combined systolic and diastolic congestive heart failure (HCC)   Bacteremia   SIRS (systemic inflammatory response syndrome) (HCC)   Epidural abscess   Abscess   1 acute metabolic encephalopathy with delirium Likely secondary to acute illness with bacteremia/epidural abscess/subcutaneous abscess.  Patient initially noted to be lethargic and somnolent and woke up to pain and touch.  Patient also noted to be confused and disoriented.  Patient has improved clinically and is alert and oriented x3.  Likely close  to baseline.  Ammonia level at 9.  Head CT negative.  MRI head negative.  Continue supportive care.  Continue IV antibiotics.  Alprazolam has been discontinued. Continue Haldol as needed for agitation.  Continue thiamine, multivitamin.  Follow.   2.  New onset atrial flutter Patient noted to be in new onset atrial flutter by EKG however currently rate controlled on current regimen of metoprolol twice daily.  CHA2DS2-VASc score is 3 due to age and hypertension, ?? CM.  Currently on IV heparin.  2D echo obtained with EF of 50 to 55%, no wall motion abnormalities, left atrial size mild to moderately dilated, mild mitral valve regurgitation.  Patient underwent TEE(07/04/2019) which was negative for vegetation, EF of 30 to 35%, moderate to severe LV dysfunction.  TSH within normal limits at 1.052.  Cardiology was consulted and are recommending continuation of beta-blocker.  Patient currently on Toprol-XL 50 mg daily.  Continue IV heparin and once no further procedures deemed necessary could likely transition to Eliquis.  Outpatient follow-up with cardiology.  3.  Cardiomyopathy 2D echo obtained with EF of 50 to 55%, no wall motion abnormalities, left atrial size mild to moderately dilated, mild mitral valve regurgitation.  Patient underwent TEE (07/04/2019) which was negative for vegetation, EF of 30 to 35%, moderate to severe LV dysfunction.  Patient seen in consultation by cardiology and continuation of beta-blocker, ACE inhibitor resumed, outpatient follow-up for further evaluation 3 to 4 weeks post discharge.  Per cardiology.  4.  Surgical wound infection/MSSA bacteremia/ventral epidural abscess per MRI(07/04/2019)/large subcutaneous fluid collection to the left of the wound probable abscess(per MRI L-spine 07/04/2019) Patient noted to have encephalopathy however seems to  be improving daily.  Patient with new onset atrial flutter and as such due to worsening encephalopathy and bacteremia MRI brain was done which  was negative for any acute abnormalities.  T-max 100.9 at 8:15 PM(07/06/2019). MRI of the L-spine consistent with ventral epidural abscess, large subcutaneous fluid collection to the left of the wound extending from L2-L5 approximately 7.1 x 2.9 x 7.5 cm, subcutaneous abscess status post ultrasound-guided drain placement a subcutaneous abscess.  Purulent drainage noted in bulb.  Cultures from abscess preliminary with staph aureus..  Patient underwent TEE on 07/04/2019 with EF of 30 to 35%, mild MR, no vegetations noted.  Continue current IV antibiotics as directed by ID.  Patient now on IV nafcillin.  Per ID.  Drain management per IR.  Per neurosurgery/primary team.  5.  Acute kidney injury with hyponatremia Likely secondary to prerenal azotemia.  Resolved with hydration.  Follow.  6.  Stage II pressure ulcer on the coccyx, POA Continue current wound care.  7.  Anemia of chronic disease Likely dilutional.  No overt bleeding.  Anemia panel consistent with anemia of chronic disease.  Hemoglobin is stable currently at 8.4. Transfusion threshold hemoglobin <7.  8.  Hypokalemia Potassium at 3.6.  We will give a dose of K. Dur 40 mEq p.o. x1.  Goal to keep potassium close to 4.  Patient with atrial flutter.  Magnesium at 1.9.  Follow.    DVT prophylaxis: Heparin Code Status: Full Family Communication: Updated patient.  No family at bedside. Disposition:   Status is: Inpatient    Dispo: The patient is from: Home              Anticipated d/c is to: Per primary team.              Anticipated d/c date is: Per primary team.              Patient currently in atrial flutter however rate controlled, on IV antibiotics, with ventral epidural abscess/subcutaneous abscess of the back status post drain placement by IR.  MSSA bacteremia.  Disposition per primary team.         Consultants:   Cardiology: Dr. Cristal Deer 07/04/2019  ID: Dr. Drue Second 07/01/2019  [facility] hospitalist: Dr. Katrinka Blazing 06/30/2019   Procedures:   MRI brain 07/04/2019  MRI L-spine 07/04/2019  TEE 07/04/2019  2D echo 07/03/2019  CT head without contrast 07/03/2019  Chest x-ray 06/30/2019  Ultrasound-guided drain placement in the subcutaneous abscess per Dr. Lowella Dandy, IR 07/05/2019  Antimicrobials:   IV Ancef 07/01/2019>>>>> 07/03/2019  IV cefepime 06/30/2019>>>> 07/01/2019  IV nafcillin 07/03/2019 >>>>   Subjective: Patient sitting up in bed drinking coffee.  Denies any chest pain or shortness of breath.  States right-sided buttocks pain improved after drain placement.  Still complaining of low back pain ongoing since admission.  T-max 100.9 at 2015 hrs(07/06/19).  Objective: Vitals:   07/06/19 2015 07/06/19 2350 07/07/19 0424 07/07/19 0725  BP: (!) 144/67 129/62 (!) 112/51 (!) 149/66  Pulse: 74 86 67 69  Resp: 20 18 19 20   Temp: (!) 100.9 F (38.3 C) 98.2 F (36.8 C) 98.1 F (36.7 C) 98 F (36.7 C)  TempSrc: Oral Oral Oral Oral  SpO2: 100% 97% 95% 92%  Weight:      Height:        Intake/Output Summary (Last 24 hours) at 07/07/2019 0850 Last data filed at 07/07/2019 0800 Gross per 24 hour  Intake 1194.63 ml  Output 1636 ml  Net -441.37 ml  Filed Weights   06/30/19 1400 07/04/19 0704  Weight: 66.7 kg 66.7 kg    Examination:  General exam: NAD Respiratory system: CTA B.  No wheezes, no crackles, no rhonchi.  Normal respiratory effort.  Speaking in full sentences. Cardiovascular system: Regular rate rhythm no murmurs rubs or gallops.  No JVD.  Trace to 1+ pedal edema.  Gastrointestinal system: Abdomen is soft, nontender, nondistended, positive bowel sounds.  No rebound.  No guarding.  Subcutaneous drain with purulent material in bulb.   Central nervous system: Alert and oriented. No focal neurological deficits. Extremities: Symmetric 5 x 5 power. Skin: No rashes, lesions or ulcers Psychiatry: Judgement and insight appear normal. Mood & affect appropriate.     Data Reviewed: I have personally  reviewed following labs and imaging studies  CBC: Recent Labs  Lab 07/03/19 1159 07/04/19 0145 07/05/19 0414 07/06/19 0538 07/07/19 0137  WBC 13.3* 14.0* 13.5* 11.5* 9.6  NEUTROABS 12.1* 12.0* 11.3* 9.7* 7.6  HGB 10.2* 10.8* 8.9* 9.3* 8.4*  HCT 30.0* 32.3* 26.1* 28.3* 25.6*  MCV 89.8 90.0 88.8 91.0 91.4  PLT 179 189 245 292 271    Basic Metabolic Panel: Recent Labs  Lab 07/03/19 1159 07/04/19 0145 07/04/19 1330 07/05/19 0414 07/06/19 0538 07/07/19 0137  NA 133* 136  --  132* 134* 137  K 4.1 3.7  --  3.4* 3.2* 3.6  CL 98 102  --  101 100 104  CO2 24 24  --  23 25 25   GLUCOSE 155* 181*  --  204* 172* 149*  BUN 23 24*  --  26* 23 21  CREATININE 0.88 1.00  --  1.12 1.17 1.16  CALCIUM 8.3* 8.3*  --  7.5* 7.7* 7.7*  MG 1.8  --  2.2  --   --  1.9    GFR: Estimated Creatinine Clearance: 49.5 mL/min (by C-G formula based on SCr of 1.16 mg/dL).  Liver Function Tests: Recent Labs  Lab 06/30/19 1055 07/03/19 1159  AST 29 42*  ALT 23 25  ALKPHOS 76 102  BILITOT 1.1 1.1  PROT 5.3* 5.0*  ALBUMIN 2.3* 1.9*    CBG: Recent Labs  Lab 07/01/19 1014 07/03/19 1948  GLUCAP 147* 154*     Recent Results (from the past 240 hour(s))  Blood Culture (routine x 2)     Status: Abnormal   Collection Time: 06/30/19 10:55 AM   Specimen: BLOOD RIGHT FOREARM  Result Value Ref Range Status   Specimen Description BLOOD RIGHT FOREARM  Final   Special Requests   Final    BOTTLES DRAWN AEROBIC AND ANAEROBIC Blood Culture adequate volume   Culture  Setup Time   Final    GRAM POSITIVE COCCI IN CLUSTERS IN BOTH AEROBIC AND ANAEROBIC BOTTLES CRITICAL RESULT CALLED TO, READ BACK BY AND VERIFIED WITH: K. AMEND,PHARMD 0210 07/01/2019 T. TYSOR Performed at [facility], [address] [place], [place] [id number]    Culture STAPHYLOCOCCUS AUREUS (A)  Final   Report Status 07/03/2019 FINAL  Final   Organism ID, Bacteria STAPHYLOCOCCUS AUREUS  Final      Susceptibility    Staphylococcus aureus - MIC*    CIPROFLOXACIN <=0.5 SENSITIVE Sensitive     ERYTHROMYCIN RESISTANT Resistant     GENTAMICIN <=0.5 SENSITIVE Sensitive     OXACILLIN 0.5 SENSITIVE Sensitive     TETRACYCLINE <=1 SENSITIVE Sensitive     VANCOMYCIN 1 SENSITIVE Sensitive     TRIMETH/SULFA <=10 SENSITIVE Sensitive     CLINDAMYCIN  RESISTANT Resistant     RIFAMPIN <=0.5 SENSITIVE Sensitive     Inducible Clindamycin POSITIVE Resistant     * STAPHYLOCOCCUS AUREUS  Urine culture     Status: Abnormal   Collection Time: 06/30/19 10:55 AM   Specimen: In/Out Cath Urine  Result Value Ref Range Status   Specimen Description IN/OUT CATH URINE  Final   Special Requests   Final    NONE Performed at [facility], [address], [place], [place] [address]    Culture 60,000 COLONIES/mL STAPHYLOCOCCUS EPIDERMIDIS (A)  Final   Report Status 07/02/2019 FINAL  Final   Organism ID, Bacteria STAPHYLOCOCCUS EPIDERMIDIS (A)  Final      Susceptibility   Staphylococcus epidermidis - MIC*    CIPROFLOXACIN <=0.5 SENSITIVE Sensitive     GENTAMICIN <=0.5 SENSITIVE Sensitive     NITROFURANTOIN <=16 SENSITIVE Sensitive     OXACILLIN >=4 RESISTANT Resistant     TETRACYCLINE 2 SENSITIVE Sensitive     VANCOMYCIN 2 SENSITIVE Sensitive     TRIMETH/SULFA <=10 SENSITIVE Sensitive     CLINDAMYCIN <=0.25 SENSITIVE Sensitive     RIFAMPIN <=0.5 SENSITIVE Sensitive     Inducible Clindamycin NEGATIVE Sensitive     * 60,000 COLONIES/mL STAPHYLOCOCCUS EPIDERMIDIS  Blood Culture ID Panel (Reflexed)     Status: Abnormal   Collection Time: 06/30/19 10:55 AM  Result Value Ref Range Status   Enterococcus species NOT DETECTED NOT DETECTED Final   Listeria monocytogenes NOT DETECTED NOT DETECTED Final   Staphylococcus species DETECTED (A) NOT DETECTED Final    Comment: CRITICAL RESULT CALLED TO, READ BACK BY AND VERIFIED WITH: K. AMEND,PHARMD 0210 07/01/2019 T. TYSOR    Staphylococcus aureus (BCID) DETECTED (A) NOT DETECTED  Final    Comment: Methicillin (oxacillin) susceptible Staphylococcus aureus (MSSA). Preferred therapy is anti staphylococcal beta lactam antibiotic (Cefazolin or Nafcillin), unless clinically contraindicated. CRITICAL RESULT CALLED TO, READ BACK BY AND VERIFIED WITH: K. AMEND,PHARMD 0210 07/01/2019 T. TYSOR    Methicillin resistance NOT DETECTED NOT DETECTED Final   Streptococcus species NOT DETECTED NOT DETECTED Final   Streptococcus agalactiae NOT DETECTED NOT DETECTED Final   Streptococcus pneumoniae NOT DETECTED NOT DETECTED Final   Streptococcus pyogenes NOT DETECTED NOT DETECTED Final   Acinetobacter baumannii NOT DETECTED NOT DETECTED Final   Enterobacteriaceae species NOT DETECTED NOT DETECTED Final   Enterobacter cloacae complex NOT DETECTED NOT DETECTED Final   Escherichia coli NOT DETECTED NOT DETECTED Final   Klebsiella oxytoca NOT DETECTED NOT DETECTED Final   Klebsiella pneumoniae NOT DETECTED NOT DETECTED Final   Proteus species NOT DETECTED NOT DETECTED Final   Serratia marcescens NOT DETECTED NOT DETECTED Final   Haemophilus influenzae NOT DETECTED NOT DETECTED Final   Neisseria meningitidis NOT DETECTED NOT DETECTED Final   Pseudomonas aeruginosa NOT DETECTED NOT DETECTED Final   Candida albicans NOT DETECTED NOT DETECTED Final   Candida glabrata NOT DETECTED NOT DETECTED Final   Candida krusei NOT DETECTED NOT DETECTED Final   Candida parapsilosis NOT DETECTED NOT DETECTED Final   Candida tropicalis NOT DETECTED NOT DETECTED Final    Comment: Performed at [facility], [address], [place], [place] [address]  SARS Coronavirus 2 by RT PCR (hospital order, performed in [facility] hospital lab) Nasopharyngeal Nasopharyngeal Swab     Status: None   Collection Time: 06/30/19 11:49 AM   Specimen: Nasopharyngeal Swab  Result Value Ref Range Status   SARS Coronavirus 2 NEGATIVE NEGATIVE Final  Comment: (NOTE) SARS-CoV-2 target nucleic acids are NOT DETECTED.  The SARS-CoV-2 RNA is generally detectable in upper and lower respiratory specimens during the acute phase of infection. The lowest concentration of SARS-CoV-2 viral copies this assay can detect is 250 copies / mL. A negative result does not preclude SARS-CoV-2 infection and should not be used as the sole basis for treatment or other patient management decisions.  A negative result may occur with improper specimen collection / handling, submission of specimen other than nasopharyngeal swab, presence of viral mutation(s) within the areas targeted by this assay, and inadequate number of viral copies (<250 copies / mL). A negative result must be combined with clinical observations, patient history, and epidemiological information. Fact Sheet for Patients:   BoilerBrush.com.cy Fact Sheet for Healthcare Providers: https://pope.com/ This test is not yet approved or cleared  by the [place] FDA and has been authorized for detection and/or diagnosis of SARS-CoV-2 by FDA under an Emergency Use Authorization (EUA).  This EUA will remain in effect (meaning this test can be used) for the duration of the COVID-19 declaration under Section 564(b)(1) of the Act, 21 U.S.C. section 360bbb-3(b)(1), unless the authorization is terminated or revoked sooner. Performed at [facility], [address] [place], [place] [address]   Blood Culture (routine x 2)     Status: Abnormal   Collection Time: 06/30/19 12:00 PM   Specimen: BLOOD RIGHT WRIST  Result Value Ref Range Status   Specimen Description BLOOD RIGHT WRIST  Final   Special Requests   Final    BOTTLES DRAWN AEROBIC AND ANAEROBIC Blood Culture results may not be optimal due to an inadequate volume of blood received in culture bottles   Culture  Setup Time   Final    GRAM POSITIVE COCCI IN CLUSTERS IN BOTH AEROBIC AND ANAEROBIC BOTTLES CRITICAL RESULT CALLED TO, READ BACK BY AND VERIFIED  WITH: K. AMEND,PHARMD 0210 07/01/2019 T. TYSOR    Culture (A)  Final    STAPHYLOCOCCUS AUREUS SUSCEPTIBILITIES PERFORMED ON PREVIOUS CULTURE WITHIN THE LAST 5 DAYS. Performed at [facility], [address], [place], [place] [address]    Report Status 07/03/2019 FINAL  Final  Aerobic/Anaerobic Culture (surgical/deep wound)     Status: None (Preliminary result)   Collection Time: 06/30/19  3:42 PM   Specimen: Wound  Result Value Ref Range Status   Specimen Description WOUND SITE NOT SPECIFIED  Final   Special Requests NO ANA SWAB  Final   Gram Stain   Final    NO WBC SEEN MODERATE GRAM POSITIVE COCCI IN PAIRS IN CLUSTERS Performed at [facility], [address] [place], [place] [address]    Culture   Final    ABUNDANT STAPHYLOCOCCUS AUREUS NO ANAEROBES ISOLATED; CULTURE IN PROGRESS FOR 5 DAYS    Report Status PENDING  Incomplete   Organism ID, Bacteria STAPHYLOCOCCUS AUREUS  Final      Susceptibility   Staphylococcus aureus - MIC*    CIPROFLOXACIN <=0.5 SENSITIVE Sensitive     ERYTHROMYCIN RESISTANT Resistant     GENTAMICIN <=0.5 SENSITIVE Sensitive     OXACILLIN <=0.25 SENSITIVE Sensitive     TETRACYCLINE <=1 SENSITIVE Sensitive     VANCOMYCIN <=0.5 SENSITIVE Sensitive     TRIMETH/SULFA <=10 SENSITIVE Sensitive     CLINDAMYCIN RESISTANT Resistant     RIFAMPIN <=0.5 SENSITIVE Sensitive     Inducible Clindamycin POSITIVE Resistant     * ABUNDANT STAPHYLOCOCCUS AUREUS  MRSA PCR Screening  Status: None   Collection Time: 06/30/19 10:27 PM   Specimen: Nasopharyngeal  Result Value Ref Range Status   MRSA by PCR NEGATIVE NEGATIVE Final    Comment:        The GeneXpert MRSA Assay (FDA approved for NASAL specimens only), is one component of a comprehensive MRSA colonization surveillance program. It is not intended to diagnose MRSA infection nor to guide or monitor treatment for MRSA infections. Performed at [facility], [address]  [place], [place] [address]   Aerobic/Anaerobic Culture (surgical/deep wound)     Status: None   Collection Time: 07/01/19 11:54 AM   Specimen: Wound  Result Value Ref Range Status   Specimen Description WOUND  Final   Special Requests NONE  Final   Gram Stain   Final    FEW WBC PRESENT, PREDOMINANTLY PMN ABUNDANT GRAM POSITIVE COCCI    Culture   Final    ABUNDANT STAPHYLOCOCCUS AUREUS NO ANAEROBES ISOLATED Performed at [facility], [address], [place], [place] [address]    Report Status 07/06/2019 FINAL  Final   Organism ID, Bacteria STAPHYLOCOCCUS AUREUS  Final      Susceptibility   Staphylococcus aureus - MIC*    CIPROFLOXACIN <=0.5 SENSITIVE Sensitive     ERYTHROMYCIN RESISTANT Resistant     GENTAMICIN <=0.5 SENSITIVE Sensitive     OXACILLIN 0.5 SENSITIVE Sensitive     TETRACYCLINE <=1 SENSITIVE Sensitive     VANCOMYCIN 1 SENSITIVE Sensitive     TRIMETH/SULFA <=10 SENSITIVE Sensitive     CLINDAMYCIN RESISTANT Resistant     RIFAMPIN <=0.5 SENSITIVE Sensitive     Inducible Clindamycin POSITIVE Resistant     * ABUNDANT STAPHYLOCOCCUS AUREUS  Culture, blood (routine x 2)     Status: None   Collection Time: 07/02/19 12:06 PM   Specimen: BLOOD  Result Value Ref Range Status   Specimen Description BLOOD RIGHT ANTECUBITAL  Final   Special Requests   Final    BOTTLES DRAWN AEROBIC AND ANAEROBIC Blood Culture adequate volume   Culture   Final    NO GROWTH 5 DAYS Performed at [facility], [address] [place], [place] [address]    Report Status 07/07/2019 FINAL  Final  Culture, blood (routine x 2)     Status: None   Collection Time: 07/02/19 12:11 PM   Specimen: BLOOD RIGHT HAND  Result Value Ref Range Status   Specimen Description BLOOD RIGHT HAND  Final   Special Requests   Final    BOTTLES DRAWN AEROBIC AND ANAEROBIC Blood Culture adequate volume   Culture   Final    NO GROWTH 5 DAYS Performed at [facility], [address], [place], [place]  [address]    Report Status 07/07/2019 FINAL  Final  Culture, blood (routine x 2)     Status: None (Preliminary result)   Collection Time: 07/03/19  2:33 PM   Specimen: BLOOD RIGHT HAND  Result Value Ref Range Status   Specimen Description BLOOD RIGHT HAND  Final   Special Requests   Final    BOTTLES DRAWN AEROBIC AND ANAEROBIC Blood Culture adequate volume   Culture   Final    NO GROWTH 4 DAYS Performed at [facility], [address], [place], [place] [address]    Report Status PENDING  Incomplete  Culture, blood (routine x 2)     Status: None (Preliminary result)   Collection Time: 07/03/19  2:37 PM  Specimen: BLOOD RIGHT ARM  Result Value Ref Range Status   Specimen Description BLOOD RIGHT ARM  Final   Special Requests   Final    BOTTLES DRAWN AEROBIC AND ANAEROBIC Blood Culture results may not be optimal due to an excessive volume of blood received in culture bottles   Culture   Final    NO GROWTH 4 DAYS Performed at [facility], [place] [address], [place], [place] [address]    Report Status PENDING  Incomplete  Aerobic/Anaerobic Culture (surgical/deep wound)     Status: None (Preliminary result)   Collection Time: 07/05/19 12:38 PM   Specimen: Abscess  Result Value Ref Range Status   Specimen Description ABSCESS  Final   Special Requests NONE  Final   Gram Stain   Final    MODERATE WBC PRESENT,BOTH PMN AND MONONUCLEAR MODERATE GRAM POSITIVE COCCI IN PAIRS IN CLUSTERS    Culture   Final    ABUNDANT STAPHYLOCOCCUS AUREUS SUSCEPTIBILITIES TO FOLLOW Performed at [facility], [place] [address] [place], [place] [address]    Report Status PENDING  Incomplete         Radiology Studies: IR US Guide Bx Asp/Drain  Result Date: 07/05/2019 INDICATION: 79 year old with a postoperative lumbar spine infection. Patient has a large subcutaneous fluid collection on the left side of the back and concerning for an abscess. EXAM: ULTRASOUND-GUIDED PLACEMENT OF A  DRAINAGE CATHETER WITHIN THE SUBCUTANEOUS ABSCESS MEDICATIONS: None ANESTHESIA/SEDATION: None COMPLICATIONS: None immediate. PROCEDURE: Informed written consent was obtained from the patient after a thorough discussion of the procedural risks, benefits and alternatives. All questions were addressed. Maximal Sterile Barrier Technique was utilized including caps, mask, sterile gowns, sterile gloves, sterile drape, hand hygiene and skin antiseptic. A timeout was performed prior to the initiation of the procedure. Patient was placed prone. The left back subcutaneous tissues was evaluated with ultrasound. Large hypoechoic fluid collection was identified lateral to the wound dressings. Skin was prepped with chlorhexidine and sterile field was created. Skin and soft tissues were anesthetized with 1% lidocaine. Small incision was made. Using ultrasound guidance, an 18 gauge needle was directed into the fluid collection but no fluid could be aspirated. Therefore, the 18 gauge needle was removed and a 5 [place] Yueh catheter was directed into the fluid collection using ultrasound guidance. Again, no significant fluid could be aspirated. Superstiff Amplatz wire was easily advanced into the collection and confirmed to be within the fluid with ultrasound. Tract was dilated to accommodate a 12 [place] multipurpose drain. Approximately 22 mL of yellow purulent fluid was then removed. Catheter was manipulated within the collection order to improve drainage. Catheter was sutured to skin. Catheter was flushed with sterile saline and attached to a suction bulb. FINDINGS: Hypoechoic fluid collection in the left back subcutaneous tissues. Findings correspond with the large fluid collection on recent MRI. 12 French drain was placed within the collection and 22 mL of purulent fluid was removed. IMPRESSION: Ultrasound-guided placement of a drainage catheter within the left back subcutaneous abscess. Fluid was sent for culture. Electronically  Signed   By: Markus Daft M.D.   On: 07/05/2019 13:48        Scheduled Meds: . docusate sodium  100 mg Oral BID  . lisinopril  10 mg Oral Daily  . metoprolol succinate  50 mg Oral Daily  . multivitamin  1 tablet Oral Daily  . PARoxetine  30 mg Oral QPM  . sodium chloride flush  3 mL Intravenous Q12H  .  sodium chloride flush  5 mL Intracatheter Q8H  . thiamine  100 mg Oral Daily   Continuous Infusions: . sodium chloride Stopped (07/04/19 0815)  . sodium chloride Stopped (07/06/19 1056)  . heparin 1,800 Units/hr (07/07/19 0432)  . nafcillin (NAFCIL) continuous infusion 12 g (07/06/19 1056)     LOS: 7 days    Time spent: 35 minutes    Ramiro Harvest, MD [facility]   To contact the attending provider between 7A-7P or the covering provider during after hours 7P-7A, please log into the web site [email or web address] and access using universal [place] password for that web site. If you do not have the password, please call the hospital operator.  07/07/2019, 8:50 AM

## 2019-07-07 NOTE — Progress Notes (Signed)
[facility] for Infectious Disease    Date of Admission:  06/30/2019   Total days of antibiotics 8/day 5 nafcillin           ID: Neil Cordova is a 79 y.o. 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 Sleeping but easily arousable, conversant and appropriate. Some ongoing back pain.   Medications:  . docusate sodium  100 mg Oral BID  . lisinopril  10 mg Oral Daily  . metoprolol succinate  50 mg Oral Daily  . multivitamin  1 tablet Oral Daily  . PARoxetine  30 mg Oral QPM  . sodium chloride flush  3 mL Intravenous Q12H  . sodium chloride flush  5 mL Intracatheter Q8H  . thiamine  100 mg Oral Daily    Objective: Vital signs in last 24 hours: Temp:  [98 F (36.7 C)-100.9 F (38.3 C)] 98 F (36.7 C) (05/31 0725) Pulse Rate:  [67-86] 69 (05/31 0725) Resp:  [18-20] 20 (05/31 0725) BP: (112-149)/(51-67) 149/66 (05/31 0725) SpO2:  [92 %-100 %] 92 % (05/31 0725) Physical Exam  Constitutional: He is oriented to person, place, and time. He appears well-developed and well-nourished. No distress.  HENT:  Mouth/Throat: Oropharynx is clear and moist. No oropharyngeal exudate.  Cardiovascular: Normal rate, regular rhythm and normal heart sounds. Exam reveals no gallop and no friction rub.  No murmur heard.  Pulmonary/Chest: Effort normal and breath sounds normal. No respiratory distress. He has no wheezes.  Abdominal: Soft. Bowel sounds are normal.  He exhibits no distension. There is no tenderness.  Back: lumbar drain for large subcutaneous fluid collection - purulent fluid in collection system/jp bulb Neurological: He is alert and oriented to person, place, and time.  Skin: Skin is warm and dry. No rash noted. No erythema.  Psychiatric: He has a normal mood and affect. His behavior is normal.     Lab Results Recent Labs    07/06/19 0538 07/07/19 0137  WBC 11.5* 9.6  HGB 9.3* 8.4*  HCT 28.3* 25.6*  NA 134* 137  K 3.2* 3.6  CL 100 104  CO2 25 25  BUN 23 21  CREATININE 1.17 1.16    Microbiology: 5/29 fluid MSSA 5/27 blood cx NGTD  Studies/Results: IR US Guide Bx Asp/Drain  Result Date: 07/05/2019 INDICATION: 79 year old with a postoperative lumbar spine infection. Patient has a large subcutaneous fluid collection on the left side of the back and concerning for an abscess. EXAM: ULTRASOUND-GUIDED PLACEMENT OF A DRAINAGE CATHETER WITHIN THE SUBCUTANEOUS ABSCESS MEDICATIONS: None ANESTHESIA/SEDATION: None COMPLICATIONS: None immediate. PROCEDURE: Informed written consent was obtained from the patient after a thorough discussion of the procedural risks, benefits and alternatives. All questions were addressed. Maximal Sterile Barrier Technique was utilized including caps, mask, sterile gowns, sterile gloves, sterile drape, hand hygiene and skin antiseptic. A timeout was performed prior to the initiation of the procedure. Patient was placed prone. The left back subcutaneous tissues was evaluated with ultrasound. Large hypoechoic fluid collection was identified lateral to  the wound dressings. Skin was prepped with chlorhexidine and sterile field was created. Skin and soft tissues were anesthetized with 1% lidocaine. Small incision was made. Using ultrasound guidance, an 18 gauge needle was directed into the fluid collection but no fluid could be aspirated. Therefore, the 18 gauge needle was removed and a 5 [place] Yueh catheter was  directed into the fluid collection using ultrasound guidance. Again, no significant fluid could be aspirated. Superstiff Amplatz wire was easily advanced into the collection and confirmed to be within the fluid with ultrasound. Tract was dilated to accommodate a 12 [place] multipurpose drain. Approximately 22 mL of yellow purulent fluid was then removed. Catheter was manipulated within the collection order to improve drainage. Catheter was sutured to skin. Catheter was flushed with sterile saline and attached to a suction bulb. FINDINGS: Hypoechoic fluid collection in the left back subcutaneous tissues. Findings correspond with the large fluid collection on recent MRI. 12 French drain was placed within the collection and 22 mL of purulent fluid was removed. IMPRESSION: Ultrasound-guided placement of a drainage catheter within the left back subcutaneous abscess. Fluid was sent for culture. Electronically Signed   By: Richarda Overlie M.D.   On: 07/05/2019 13:48     Assessment/Plan: Disseminated MSSA infection 2/2 SSI c/b ventral epidural abscess, discitis, and large subcutaneous fluid colllection  - if he reamins afebrile for the next 24hr, can place picc line tomorrow - continue on nafcillin for 6 wk using 5/30 as day 1 - continue to have drain in place and monitor output/flush per IR recs - recommend repeat lumbar/thoracic MRI in 2 wks to see if improvement/worsening given burden of disease  [facility] Cell: [phone] Pager: [phone]  07/07/2019, 11:31 AM

## 2019-07-08 ENCOUNTER — Inpatient Hospital Stay: Payer: Self-pay

## 2019-07-08 ENCOUNTER — Inpatient Hospital Stay (HOSPITAL_COMMUNITY): Payer: Medicare Other

## 2019-07-08 DIAGNOSIS — R600 Localized edema: Secondary | ICD-10-CM

## 2019-07-08 LAB — CBC WITH DIFFERENTIAL/PLATELET
Abs Immature Granulocytes: 0.13 10*3/uL — ABNORMAL HIGH (ref 0.00–0.07)
Basophils Absolute: 0 10*3/uL (ref 0.0–0.1)
Basophils Relative: 0 %
Eosinophils Absolute: 0.1 10*3/uL (ref 0.0–0.5)
Eosinophils Relative: 0 %
HCT: 31.5 % — ABNORMAL LOW (ref 39.0–52.0)
Hemoglobin: 10 g/dL — ABNORMAL LOW (ref 13.0–17.0)
Immature Granulocytes: 1 %
Lymphocytes Relative: 11 %
Lymphs Abs: 1.2 10*3/uL (ref 0.7–4.0)
MCH: 29.9 pg (ref 26.0–34.0)
MCHC: 31.7 g/dL (ref 30.0–36.0)
MCV: 94.3 fL (ref 80.0–100.0)
Monocytes Absolute: 0.7 10*3/uL (ref 0.1–1.0)
Monocytes Relative: 6 %
Neutro Abs: 9.5 10*3/uL — ABNORMAL HIGH (ref 1.7–7.7)
Neutrophils Relative %: 82 %
Platelets: 335 10*3/uL (ref 150–400)
RBC: 3.34 MIL/uL — ABNORMAL LOW (ref 4.22–5.81)
RDW: 15.5 % (ref 11.5–15.5)
WBC: 11.7 10*3/uL — ABNORMAL HIGH (ref 4.0–10.5)
nRBC: 0 % (ref 0.0–0.2)

## 2019-07-08 LAB — HEPARIN LEVEL (UNFRACTIONATED): Heparin Unfractionated: 0.43 IU/mL (ref 0.30–0.70)

## 2019-07-08 LAB — BASIC METABOLIC PANEL
Anion gap: 10 (ref 5–15)
BUN: 19 mg/dL (ref 8–23)
CO2: 22 mmol/L (ref 22–32)
Calcium: 8.1 mg/dL — ABNORMAL LOW (ref 8.9–10.3)
Chloride: 102 mmol/L (ref 98–111)
Creatinine, Ser: 1.21 mg/dL (ref 0.61–1.24)
GFR calc Af Amer: >60 mL/min (ref >60)
GFR calc non Af Amer: 57 mL/min — ABNORMAL LOW (ref >60)
Glucose, Bld: 133 mg/dL — ABNORMAL HIGH (ref 70–99)
Potassium: 3.9 mmol/L (ref 3.5–5.1)
Sodium: 134 mmol/L — ABNORMAL LOW (ref 135–145)

## 2019-07-08 LAB — CULTURE, BLOOD (ROUTINE X 2)
Culture: NO GROWTH
Culture: NO GROWTH
Special Requests: ADEQUATE

## 2019-07-08 IMAGING — DX DG CHEST 1V PORT
1 series · 1 of 1 positions shown · non-contrast
Comparison: [DATE]

CLINICAL DATA: Right PICC placement

EXAM:
PORTABLE CHEST 1 VIEW

[chest ap]
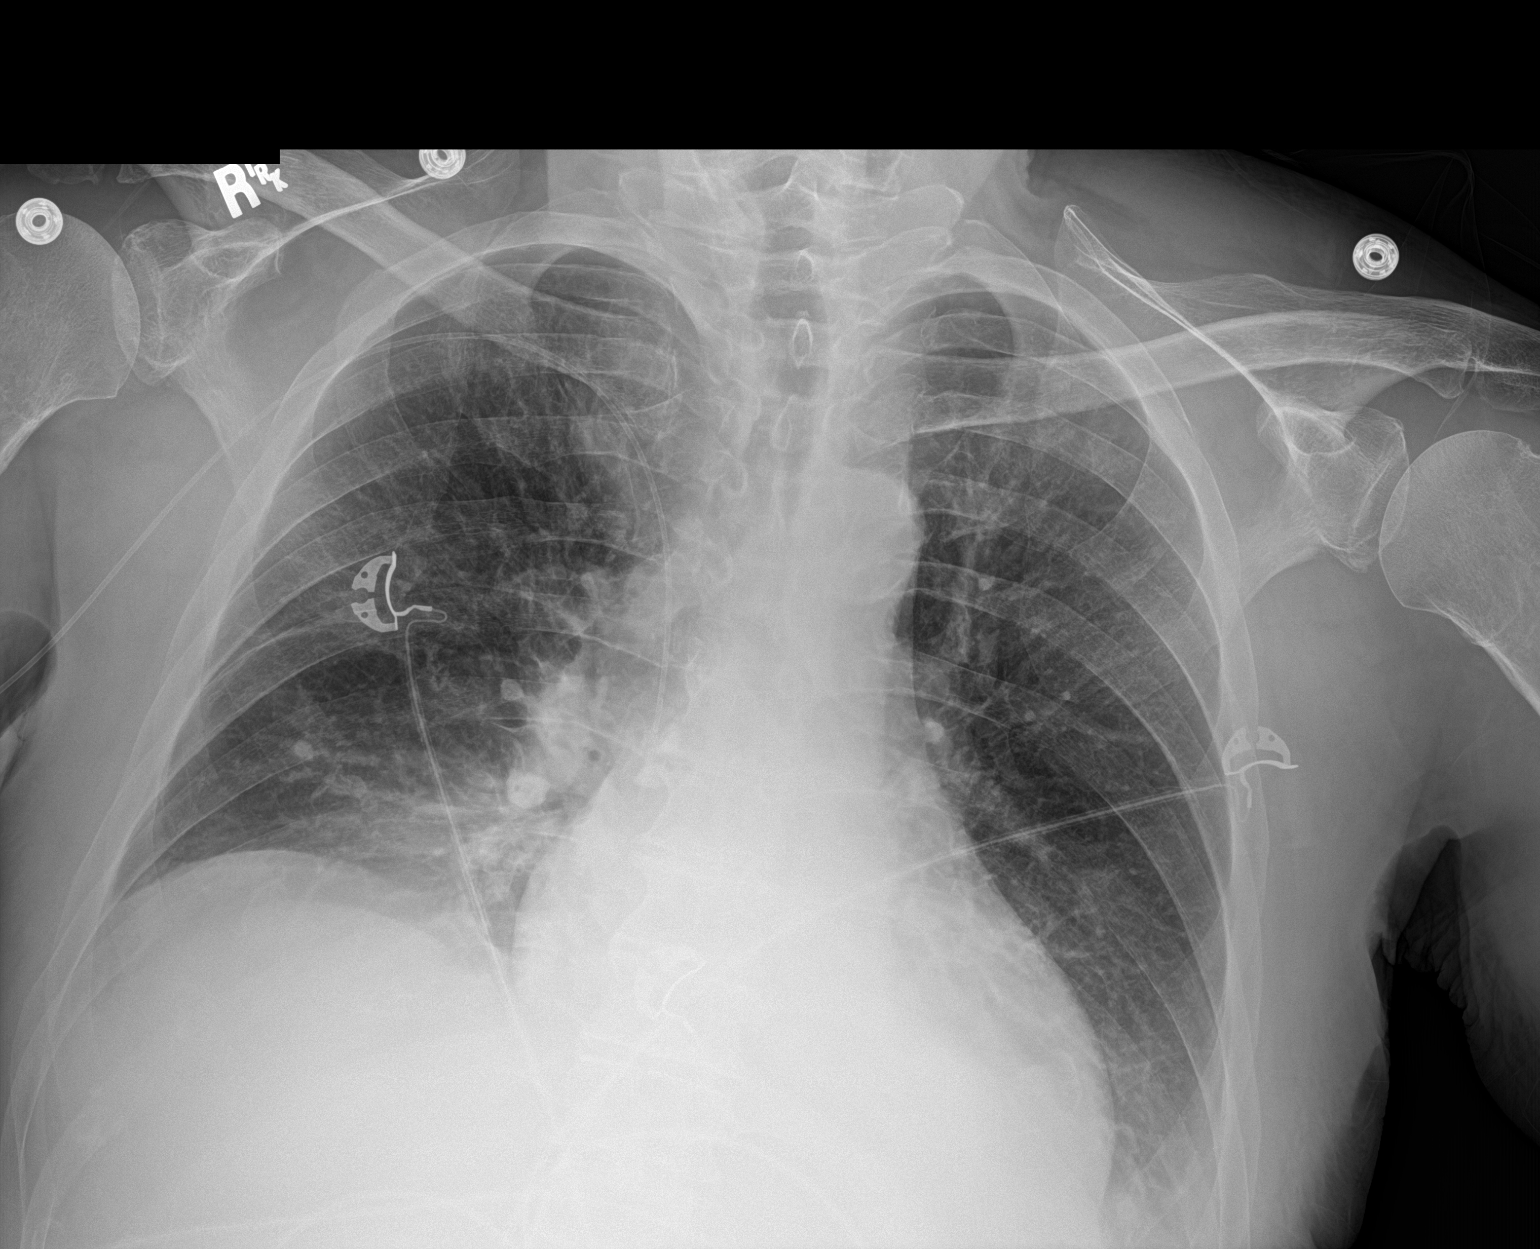

[1 of 1 positions shown; findings below may reference images not displayed]

FINDINGS: Interval placement of right upper extremity PICC, tip positioned
near the superior cavoatrial junction. The heart size and
mediastinal contours are within normal limits. There may be
bibasilar atelectasis and/or small layering pleural effusions. The
visualized skeletal structures are unremarkable.
IMPRESSION: 1. Interval placement of right upper extremity PICC, tip positioned
near the superior cavoatrial junction.
2. There may be bibasilar atelectasis and/or small layering pleural
effusions on AP portable radiograph, consider PA and lateral
radiographs to further evaluate.

## 2019-07-08 MED ORDER — SODIUM CHLORIDE 0.9% FLUSH
10.0000 mL | Freq: Two times a day (BID) | INTRAVENOUS | Status: DC
  Administered 2019-07-08 – 2019-07-10 (×5): 10 mL

## 2019-07-08 MED ORDER — CHLORHEXIDINE GLUCONATE CLOTH 2 % EX PADS
6.0000 | MEDICATED_PAD | Freq: Every day | CUTANEOUS | Status: DC
  Administered 2019-07-09 – 2019-07-10 (×2): 6 via TOPICAL

## 2019-07-08 MED ORDER — FUROSEMIDE 10 MG/ML IJ SOLN
20.0000 mg | Freq: Once | INTRAMUSCULAR | Status: AC
  Administered 2019-07-08: 20 mg via INTRAVENOUS
  Filled 2019-07-08: qty 2

## 2019-07-08 MED ORDER — SODIUM CHLORIDE 0.9% FLUSH: 10.0000 mL | INTRAVENOUS | Status: DC | PRN

## 2019-07-08 MED ORDER — POTASSIUM CHLORIDE CRYS ER 20 MEQ PO TBCR
20.0000 meq | EXTENDED_RELEASE_TABLET | Freq: Once | ORAL | Status: AC
  Administered 2019-07-08: 20 meq via ORAL
  Filled 2019-07-08: qty 1

## 2019-07-08 NOTE — Progress Notes (Signed)
Physical Therapy Treatment Patient Details Name: Neil Cordova MRN: [id number] DOB: Apr 27, 1940 Today's Date: 07/08/2019    History of Present Illness 79 yo male L4-5 microdiscectomy on 5/11 noted to have fever, purulent drainage, cills an swelling. On 5/21 taken to ED due to confusion left AMA per notes.On 5/24 confusion HTN fevers arrived via ambulance from home. Blood cx grew Mssa and taken to OR 5/25 for wash out with Dr Kathyrn Sheriff. PMH depression HTN    PT Comments    Patient progressing with mobility.  Still slow and requiring supervision for safety due to cognitive deficits and limited recall of back precautions.  We attempted to sit in chair again today after ambulation with pillows in seat and behind him, but he could not sit more than a few minutes prior to asking to return to bed.  Seems motivated to sit, but painful due to back and longstanding problem with sore on his bottom per pt.  Feel he could safely return home if family can provide 24 hour supervision for safety.  PT to follow acutely.   Follow Up Recommendations  Home health PT;Supervision/Assistance - 24 hour     Equipment Recommendations  Rolling walker with 5" wheels;3in1 (PT)(he may already have RW)    Recommendations for Other Services       Precautions / Restrictions Precautions Precautions: Fall;Back    Mobility  Bed Mobility Overal bed mobility: Needs Assistance   Rolling: Supervision Sidelying to sit: Min guard     Sit to sidelying: Mod assist General bed mobility comments: questioning cues initially for technique for supine to sit, but pt could not remember so edcuated on technique, assist for safety side to sit as struggling to come up, assist for legs into bed to sidelying  Transfers   Equipment used: Rolling walker (2 wheeled) Transfers: Sit to/from Stand Sit to Stand: Min guard         General transfer comment: assist for balance coming up to stand  Ambulation/Gait Ambulation/Gait  assistance: Min guard;Supervision Gait Distance (Feet): 300 Feet Assistive device: Rolling walker (2 wheeled) Gait Pattern/deviations: Step-through pattern;Decreased stride length     General Gait Details: longer lap with RW and increased time, no LOB and S for safety and direction   Stairs             Wheelchair Mobility    Modified Rankin (Stroke Patients Only)       Balance Overall balance assessment: Needs assistance Sitting-balance support: Feet supported Sitting balance-Leahy Scale: Good     Standing balance support: Bilateral upper extremity supported Standing balance-Leahy Scale: Fair                              Cognition Arousal/Alertness: Awake/alert Behavior During Therapy: WFL for tasks assessed/performed Overall Cognitive Status: Impaired/Different from baseline Area of Impairment: Orientation;Safety/judgement                     Memory: Decreased recall of precautions;Decreased short-term memory Following Commands: Follows one step commands consistently Safety/Judgement: Decreased awareness of deficits;Decreased awareness of safety Awareness: Intellectual Problem Solving: Slow processing;Requires verbal cues;Requires tactile cues        Exercises      General Comments General comments (skin integrity, edema, etc.): reminded him he had condom cath as he requested to toilet prior to ambulation      Pertinent Vitals/Pain Pain Score: 5  Pain Location: back Pain Descriptors / Indicators: Aching Pain  Intervention(s): Monitored during session    Home Living                      Prior Function            PT Goals (current goals can now be found in the care plan section) Progress towards PT goals: Progressing toward goals    Frequency    Min 5X/week      PT Plan Discharge plan needs to be updated    Co-evaluation              AM-PAC PT "6 Clicks" Mobility   Outcome Measure  Help needed  turning from your back to your side while in a flat bed without using bedrails?: None Help needed moving from lying on your back to sitting on the side of a flat bed without using bedrails?: A Little Help needed moving to and from a bed to a chair (including a wheelchair)?: A Little Help needed standing up from a chair using your arms (e.g., wheelchair or bedside chair)?: A Little Help needed to walk in hospital room?: A Little Help needed climbing 3-5 steps with a railing? : A Lot 6 Click Score: 18    End of Session   Activity Tolerance: Patient tolerated treatment well Patient left: in bed;with call bell/phone within reach;with bed alarm set   PT Visit Diagnosis: Unsteadiness on feet (R26.81);Muscle weakness (generalized) (M62.81);Other symptoms and signs involving the nervous system (R29.898);Pain Pain - part of body: (back)     Time: 1240-1315 PT Time Calculation (min) (ACUTE ONLY): 35 min  Charges:  $Gait Training: 8-22 mins $Therapeutic Activity: 8-22 mins                     Sheran Lawless, [facility] [phone] 07/08/2019    Neil Cordova 07/08/2019, 5:22 PM

## 2019-07-08 NOTE — Progress Notes (Signed)
ID PROGRESS NOTE  Patient is getting his picc line during this visit.  A/P:79yo M underwent Lumbar microdiscectomy c/b post surgical site infection with MSSA complicated by large ventral epidural abscess, discitis, subcutaneous abscess  Plan to treat minimum of 6 wk of IV cefazolin with end date of 08/17/19 Will have patient follow up in 2 wk at [facility] either by virtual visit, and also mid July to transition to oral abtx Defer drain management to IR Repeat mri of thoracic and lumbar spine in 2wk as previously discussed with dr Carlye Grippe B. Drue Second MD MPH [facility] [phone]

## 2019-07-08 NOTE — Progress Notes (Signed)
PHARMACY CONSULT NOTE FOR:  OUTPATIENT  PARENTERAL ANTIBIOTIC THERAPY (OPAT)  Indication: MSSA bacteremia/discitis and ventral epidural abscess Regimen: Nafcillin 12 gm every 24 hours as a continuous infusion End date: 08/17/2019  IV antibiotic discharge orders are pended. To discharging provider:  please sign these orders via discharge navigator,  Select New Orders & click on the button choice - Manage This Unsigned Work.     Thank you for allowing pharmacy to be a part of this patient's care.  Sharin Mons, PharmD, BCPS, BCIDP Infectious Diseases Clinical Pharmacist Phone: [phone] 07/08/2019, 9:38 AM

## 2019-07-08 NOTE — Progress Notes (Signed)
Inpatient Rehabilitation Admissions Coordinator  I met with patient and his wife, Neil Cordova, at bedside for rehab venue discussion. Earlier I observed patient with PT in hallway. Patient has progressed to supervision level with therapy. He is not in need of an intensive inpatient rehab admission at this level. Wife expressed her frustrations and concerns with his postoperative course since initial surgery. She requests that I speak further with surgical team to assist with admission to CIR. She states patient not ready to discharge home , nor is she accepting of SNF rehab at this time. I contacted Al Pimple, PA to convey wife's concerns as well as discussed with TOC RN CM, Kristi. I will follow up tomorrow.  Danne Baxter, RN, MSN Rehab Admissions Coordinator [phone] 07/08/2019 5:24 PM

## 2019-07-08 NOTE — Progress Notes (Signed)
Peripherally Inserted Central Catheter Placement  The IV Nurse has discussed with the patient and/or persons authorized to consent for the patient, the purpose of this procedure and the potential benefits and risks involved with this procedure.  The benefits include less needle sticks, lab draws from the catheter, and the patient may be discharged home with the catheter. Risks include, but not limited to, infection, bleeding, blood clot (thrombus formation), and puncture of an artery; nerve damage and irregular heartbeat and possibility to perform a PICC exchange if needed/ordered by physician.  Alternatives to this procedure were also discussed.  Bard Power PICC patient education guide, fact sheet on infection prevention and patient information card has been provided to patient /or left at bedside.    PICC Placement Documentation  PICC Single Lumen 07/08/19 PICC Right Basilic 39 cm 0 cm (Active)  Indication for Insertion or Continuance of Line Prolonged intravenous therapies 07/08/19 1119  Exposed Catheter (cm) 0 cm 07/08/19 1119  Site Assessment Clean;Dry;Intact 07/08/19 1119  Line Status Flushed;Blood return noted 07/08/19 1119  Dressing Type Transparent 07/08/19 1119  Dressing Status Clean;Dry;Intact;Antimicrobial disc in place;Other (Comment) 07/08/19 1119  Dressing Intervention New dressing 07/08/19 1119  Dressing Change Due 07/15/19 07/08/19 1119       Reginia Forts Albarece 07/08/2019, 11:20 AM

## 2019-07-08 NOTE — TOC Progression Note (Signed)
Transition of Care [facility]) - Progression Note    Patient Details  Name: Neil Cordova MRN: [id number] Date of Birth: 09-20-1940  Transition of [facility]) CM/SW Contact  Doy Hut[place], [place] Phone Number: 07/08/2019, 10:13 AM  Clinical Narrative:    CSW awaits determination by CIR before proceeding with further SNF work up. FL2/assessment has been completed, aware pt pref is for near to home in [place] if cannot do CIR.    Expected Discharge Plan: Skilled Nursing Facility Barriers to Discharge: English as a second language teacher, SNF Pending bed offer  Expected Discharge Plan and Services Expected Discharge Plan: Skilled Nursing Facility  Post Acute Care Choice: Skilled Nursing Facility Living arrangements for the past 2 months: Single Family Home     Readmission Risk Interventions No flowsheet data found.

## 2019-07-08 NOTE — Progress Notes (Signed)
Referring Physician(s): DR Conchita Paris  Supervising Physician: Ruel Favors  Patient Status:  [facility] - In-pt  Chief Complaint:  Back pain Post op infection - left microdissection L4-5 on 06/17/19   Subjective:  Left back subcutaneous collection drain placement in IR 5/29 Pt feels little better  OP cloudy yellow In bed    Allergies: Patient has no known allergies.  Medications: Prior to Admission medications   Medication Sig Start Date End Date Taking? Authorizing Provider  acetaminophen (TYLENOL) 325 MG tablet Take 325 mg by mouth every 6 (six) hours as needed for moderate pain.   Yes [provider]  ALPRAZolam Prudy Feeler) 1 MG tablet Take 1 mg by mouth at bedtime.   Yes [provider]  fluticasone (FLONASE) 50 MCG/ACT nasal spray Place 1 spray into both nostrils daily as needed for allergies or rhinitis.   Yes [provider]  ibuprofen (ADVIL) 200 MG tablet Take 200 mg by mouth every 6 (six) hours as needed for mild pain or moderate pain.   Yes [provider]  lisinopril (ZESTRIL) 10 MG tablet Take 10 mg by mouth every evening.   Yes [provider]  loratadine (CLARITIN) 10 MG tablet Take 10 mg by mouth daily as needed for allergies.   Yes [provider]  oxyCODONE-acetaminophen (PERCOCET) 7.5-325 MG tablet Take 1 tablet by mouth every 4 (four) hours as needed for severe pain.   Yes [provider]  PARoxetine (PAXIL) 30 MG tablet Take 30 mg by mouth every evening.   Yes [provider]  psyllium (METAMUCIL) 58.6 % packet Take 1 packet by mouth daily as needed (constipation).   Yes [provider]  sildenafil (VIAGRA) 100 MG tablet Take 100 mg by mouth daily as needed for erectile dysfunction.   Yes [provider]  Turmeric 500 MG TABS Take 500 mg by mouth daily.   Yes [provider]     Vital Signs: BP (!) 151/62 (BP Location: Right Arm)    Pulse (!) 55    Temp 97.9 F  (36.6 C) (Oral)    Resp 19    Ht 5\' 10"  (1.778 m)    Wt 147 lb 0.8 oz (66.7 kg)    SpO2 100%    BMI 21.10 kg/m   Physical Exam Skin:    General: Skin is warm and dry.     Comments: Site is clean and dry NT no bleeding OP cloudy yellow 20 cc in JP Organism ID, Bacteria STAPHYLOCOCCUS AUREUS       Imaging: MR BRAIN WO CONTRAST  Result Date: 07/04/2019 CLINICAL DATA:  Encephalopathy. EXAM: MRI HEAD WITHOUT CONTRAST TECHNIQUE: Multiplanar, multiecho pulse sequences of the brain and surrounding structures were obtained without intravenous contrast. COMPARISON:  Head CT Jul 03, 2019 FINDINGS: Brain: No acute infarction, hemorrhage, hydrocephalus, extra-axial collection or mass lesion. Scattered foci of T2 hyperintensity are seen within the white matter of the cerebral hemispheres, nonspecific, most likely related to chronic small vessel ischemia. Prominence of the supratentorial ventricles and cerebral sulci reflecting parenchymal volume loss. Vascular: Normal flow voids. Skull and upper cervical spine: Normal marrow signal. Sinuses/Orbits: Bilateral lens surgery. Paranasal sinuses are clear. IMPRESSION: 1. No acute intracranial abnormality. 2. Mild chronic micro body changes of the white matter. 3. Mild parenchymal volume loss. Electronically Signed   By: Jul 05, 2019 M.D.   On: 07/04/2019 14:53   MR Lumbar Spine W Wo Contrast  Result Date: 07/04/2019 CLINICAL DATA:  Left laminectomy  L4-5 06/17/2019. Postoperative wound infection. Positive blood cultures. EXAM: MRI LUMBAR SPINE WITHOUT AND WITH CONTRAST TECHNIQUE: Multiplanar and multiecho pulse sequences of the lumbar spine were obtained without and with intravenous contrast. CONTRAST:  6.39mL GADAVIST GADOBUTROL 1 MMOL/ML IV SOLN COMPARISON:  Lumbar MRI 06/05/2019 FINDINGS: Segmentation:  Normal Alignment:  Normal Vertebrae:  Negative for fracture. Increased fluid in the disc space at L2-3 and L3-4 compared to the prior study.  There is mild associated bone marrow edema and enhancement at L2-3 and L3-4. Possible disc space infection. Conus medullaris and cauda equina: Conus extends to the L1 level. Conus and cauda equina appear normal. Paraspinal and other soft tissues: Postsurgical wound in the midline in the lower lumbar spine. Large subcutaneous fluid collection to the left of the wound extending from L2 through L5. This measures approximately 7.1 x 2.9 x 7.5 cm. Probable abscess. Edema in the medial psoas muscle bilaterally which was not present previously. 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 Ventral epidural fluid collection extending from T12 to L2-3 compatible with epidural abscess. Additional ventral epidural fluid collection on the left at L4-L5 and posteriorly at L5-S1 compatible with additional epidural abscess. Disc levels: L1-2: Ventral epidural abscess causing mild spinal stenosis. L2-3: Increased fluid in the disc space worrisome for disc space infection. Ventral epidural abscess extending left midline. Bilateral facet hypertrophy and moderate to severe spinal stenosis which has progressed in the interval. L3-4: Fluid in the disc space. There is edema in the adjacent psoas muscle bilaterally. Broad-based disc protrusion and spurring with bilateral facet hypertrophy. 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 Left laminectomy. Bilateral facet degeneration. Moderate subarticular stenosis bilaterally. L5-S1: Left posterior epidural abscess. IMPRESSION: 1. Postop laminectomy on the left at L4-5 with adequate decompression of the spinal canal 2. 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 Additional epidural abscess on the left at L4-5 and posterolaterally on the left at L5-S1. 3. Large subcutaneous fluid collection on the left in the lumbar spine compatible with abscess. 4. Increased fluid in the disc space at  L2-3 and L3-4 with edema in the adjacent psoas muscle. These disc spaces may be infected. These results were called by telephone at the time of interpretation on 07/04/2019 at 4:12 pm to provider Neurosurgery PA , who verbally acknowledged these results. Electronically Signed   By: Marlan Palau M.D.   On: 07/04/2019 16:12   IR US Guide Bx Asp/Drain  Result Date: 07/05/2019 INDICATION: 79 year old with a postoperative lumbar spine infection. Patient has a large subcutaneous fluid collection on the left side of the back and concerning for an abscess. EXAM: ULTRASOUND-GUIDED PLACEMENT OF A DRAINAGE CATHETER WITHIN THE SUBCUTANEOUS ABSCESS MEDICATIONS: None ANESTHESIA/SEDATION: None COMPLICATIONS: None immediate. PROCEDURE: Informed written consent was obtained from the patient after a thorough discussion of the procedural risks, benefits and alternatives. All questions were addressed. Maximal Sterile Barrier Technique was utilized including caps, mask, sterile gowns, sterile gloves, sterile drape, hand hygiene and skin antiseptic. A timeout was performed prior to the initiation of the procedure. Patient was placed prone. The left back subcutaneous tissues was evaluated with ultrasound. Large hypoechoic fluid collection was identified lateral to the wound dressings. Skin was prepped with chlorhexidine and sterile field was created. Skin and soft tissues were anesthetized with 1% lidocaine. Small incision was made. Using ultrasound guidance, an 18 gauge needle was directed into the fluid collection but no fluid could be aspirated. Therefore,  the 18 gauge needle was removed and a 5 [place] Yueh catheter was directed into the fluid collection using ultrasound guidance. Again, no significant fluid could be aspirated. Superstiff Amplatz wire was easily advanced into the collection and confirmed to be within the fluid with ultrasound. Tract was dilated to accommodate a 12 [place] multipurpose drain. Approximately 22 mL of  yellow purulent fluid was then removed. Catheter was manipulated within the collection order to improve drainage. Catheter was sutured to skin. Catheter was flushed with sterile saline and attached to a suction bulb. FINDINGS: Hypoechoic fluid collection in the left back subcutaneous tissues. Findings correspond with the large fluid collection on recent MRI. 12 French drain was placed within the collection and 22 mL of purulent fluid was removed. IMPRESSION: Ultrasound-guided placement of a drainage catheter within the left back subcutaneous abscess. Fluid was sent for culture. Electronically Signed   By: Markus Daft M.D.   On: 07/05/2019 13:48   DG Chest Port 1 View  Result Date: 07/08/2019 CLINICAL DATA:  Right PICC placement EXAM: PORTABLE CHEST 1 VIEW COMPARISON:  06/30/2019 FINDINGS: Interval placement of right upper extremity PICC, tip positioned near the superior cavoatrial junction. The heart size and mediastinal contours are within normal limits. There may be bibasilar atelectasis and/or small layering pleural effusions. The visualized skeletal structures are unremarkable. IMPRESSION: 1. Interval placement of right upper extremity PICC, tip positioned near the superior cavoatrial junction. 2. There may be bibasilar atelectasis and/or small layering pleural effusions on AP portable radiograph, consider PA and lateral radiographs to further evaluate. Electronically Signed   By: Eddie Candle M.D.   On: 07/08/2019 11:51   [place] EKG SITE RITE  Result Date: 07/08/2019 If Site Rite image not attached, placement could not be confirmed due to current cardiac rhythm.   Labs:  CBC: Recent Labs    07/05/19 0414 07/06/19 0538 07/07/19 0137 07/08/19 0653  WBC 13.5* 11.5* 9.6 11.7*  HGB 8.9* 9.3* 8.4* 10.0*  HCT 26.1* 28.3* 25.6* 31.5*  PLT 245 292 271 335    COAGS: Recent Labs    06/30/19 1055  INR 1.1  APTT 27    BMP: Recent Labs    07/05/19 0414 07/06/19 0538 07/07/19 0137  07/08/19 0653  NA 132* 134* 137 134*  K 3.4* 3.2* 3.6 3.9  CL 101 100 104 102  CO2 23 25 25 22   GLUCOSE 204* 172* 149* 133*  BUN 26* 23 21 19   CALCIUM 7.5* 7.7* 7.7* 8.1*  CREATININE 1.12 1.17 1.16 1.21  GFRNONAA >60 59* 60* 57*  GFRAA >60 >60 >60 >60    LIVER FUNCTION TESTS: Recent Labs    06/26/19 2020 06/30/19 1055 07/03/19 1159  BILITOT 1.6* 1.1 1.1  AST 17 29 42*  ALT 19 23 25   ALKPHOS 49 76 102  PROT 6.5 5.3* 5.0*  ALBUMIN 3.6 2.3* 1.9*    Assessment and Plan:  Left back abscess drain in place 20 cc today-- cloudy yellow: +staph Will follow  Electronically Signed: Lavonia Drafts, PA-C 07/08/2019, 12:32 PM   I spent a total of 15 Minutes at the the patient's bedside AND on the patient's hospital floor or unit, greater than 50% of which was counseling/coordinating care for left back post op abscess drain

## 2019-07-08 NOTE — Progress Notes (Signed)
ANTICOAGULATION CONSULT NOTE - Follow Up Consult  Pharmacy Consult for IV Heparin Indication: atrial fibrillation  No Known Allergies  Patient Measurements: Height: 5\' 10"  (177.8 cm) Weight: 66.7 kg (147 lb 0.8 oz) IBW/kg (Calculated) : 73 Heparin Dosing Weight: 66.7 kg  Vital Signs: Temp: 97.9 F (36.6 C) (06/01 0425) Temp Source: Oral (06/01 0425) BP: 151/62 (06/01 0327) Pulse Rate: 55 (06/01 0327)  Labs: Recent Labs    07/06/19 0538 07/06/19 0538 07/06/19 1616 07/07/19 0137 07/08/19 0653  HGB 9.3*   < >  --  8.4* 10.0*  HCT 28.3*  --   --  25.6* 31.5*  PLT 292  --   --  271 335  HEPARINUNFRC 0.53   < > 0.38 0.49 0.43  CREATININE 1.17  --   --  1.16  --    < > = values in this interval not displayed.    Estimated Creatinine Clearance: 49.5 mL/min (by C-G formula based on SCr of 1.16 mg/dL).  Assessment: 79 yr old male admitted on 06/30/19 with AMS and wound drainage following L4-5 microdiscectomy at outpt surgical center on 06/17/19; he was diagnosed with lumbar wound infection and MSSA bacteremia (S/P washout on 5/25).   Pt with new atrial fibrillation; pharmacy was consulted to dose heparin (no bolus due to recent neurosurgery); will use lower heparin level goal (0.3-0.5 units/ml), as well, due to recent neurosurgery.  Heparin level today remains therapeutic at 0.43. No active bleed issues reported. CBC uptrending.  Goal of Therapy:  Heparin level: 0.3-0.5 units/ml  Monitor platelets by anticoagulation protocol: Yes   Plan:  Continue heparin infusion to 1800 units/hr Monitor daily heparin level and CBC, s/sx bleeding  Thank you for allowing pharmacy to be a part of this patient's care.  6/25, PharmD, BCPS Clinical Pharmacist Clinical phone for 07/08/2019: 09/07/2019 07/08/2019 8:04 AM   **Pharmacist phone directory can now be found on [email or web address] ([email or web address]).  Listed under [facility].

## 2019-07-08 NOTE — Progress Notes (Signed)
  NEUROSURGERY PROGRESS NOTE   No issues overnight. Complains mostly of back pain, controlled with current regimen No N/T/W in extremities.  Doing well with therapy  EXAM:  BP (!) 151/62 (BP Location: Right Arm)   Pulse (!) 55   Temp 97.9 F (36.6 C) (Oral)   Resp 19   Ht 5\' 10"  (1.778 m)   Wt 66.7 kg   SpO2 100%   BMI 21.10 kg/m   Awake, alert, oriented  Speech fluent, appropriate  CN grossly intact  5/5 BUE/BLE  Incision: bandage in place subq drain in place, purulent discharge  IMPRESSION/PLAN 79 y.o. male with lumbar infection with MSSA s/p discectomy. Steadily improving on Nafcillin. - Will cont subcutaneous drain - Cont Nafcillin - Cont therapy - per PT notes, patient is doing too well for CIR. I think SNF would be detrimental to his recovery. If patient is not a candidate for CIR, will send home with HH - Cont Toprol for aflutter with outpatient cards f/u

## 2019-07-08 NOTE — Progress Notes (Signed)
PROGRESS NOTE/ Consult    Neil Cordova  ZOX:[id number] DOB: 13-Sep-1940 DOA: 06/30/2019 PCP: Patient, No Pcp Per    Chief Complaint  Patient presents with  . Fever  . Altered Mental Status    Brief Narrative:  Patient was initially consulted on May 24 for assistance in medical management.  At that point patient had a postoperative wound infection, status post left L4-L5 microdissection on 5/11.  He also had metabolic encephalopathy, hypotension, hyponatremia and acute kidney injury.  Recommendations were to continue close neurologic monitoring, continue intravenous antibiotics and IV fluids.  His blood cultures grew MSSA and patient was taken to the OR on 5/25 for washout, finding a deep surgical site infection that did not track into the epidural space.  Infectious disease was consulted, recommendations to change antibiotic therapy to cefazolin and obtain further cardiac images to rule out endocarditis.  His echocardiogram performed today had no evidence of valvular vegetations, and the plan is to obtain a transesophageal echocardiogram in the morning.  Patient continued to be encephalopathic and not back to baseline, TRH was reconsulted for further recommendations.   Assessment & Plan:   Principal Problem:   Wound infection after surgery Active Problems:   Hyponatremia   Pressure injury of skin   Acute metabolic encephalopathy   Delirium   Atrial flutter (HCC)   Acute combined systolic and diastolic congestive heart failure (HCC)   Bacteremia   SIRS (systemic inflammatory response syndrome) (HCC)   Epidural abscess   Abscess   1 acute metabolic encephalopathy with delirium Likely secondary to acute illness with bacteremia/epidural abscess/subcutaneous abscess.  Patient initially noted to be lethargic and somnolent and woke up to pain and touch.  Patient also noted to be confused and disoriented.  Mentation has improved.  Alert and oriented x3.  Likely at baseline.   Ammonia level at 9.  Head CT negative.  MRI head negative.  Continue supportive care.  Continue IV antibiotics.  Alprazolam has been discontinued. Continue Haldol as needed for agitation.  Continue thiamine, multivitamin.  Follow.   2.  New onset atrial flutter Patient noted to be in new onset atrial flutter by EKG however currently rate controlled on current regimen of metoprolol twice daily.  CHA2DS2-VASc score is 3 due to age and hypertension, ?? CM.  Currently on IV heparin.  2D echo obtained with EF of 50 to 55%, no wall motion abnormalities, left atrial size mild to moderately dilated, mild mitral valve regurgitation.  Patient underwent TEE(07/04/2019) which was negative for vegetation, EF of 30 to 35%, moderate to severe LV dysfunction.  TSH within normal limits at 1.052.  Cardiology was consulted and are recommending continuation of beta-blocker.  Patient currently on Toprol-XL 50 mg daily.  Continue IV heparin and once no further procedures deemed necessary could likely transition to Eliquis.  Outpatient follow-up with cardiology.  3.  Cardiomyopathy 2D echo obtained with EF of 50 to 55%, no wall motion abnormalities, left atrial size mild to moderately dilated, mild mitral valve regurgitation.  Patient underwent TEE (07/04/2019) which was negative for vegetation, EF of 30 to 35%, moderate to severe LV dysfunction.  Patient seen in consultation by cardiology and continuation of beta-blocker, ACE inhibitor resumed, outpatient follow-up for further evaluation 3 to 4 weeks post discharge.  Per cardiology.  4.  Surgical wound infection/MSSA bacteremia/ventral epidural abscess per MRI(07/04/2019)/large subcutaneous fluid collection to the left of the wound probable abscess(per MRI L-spine 07/04/2019) Patient noted to have encephalopathy however seems to be improving daily.  Patient with new onset atrial flutter and as such due to worsening encephalopathy and bacteremia MRI brain was done which was negative  for any acute abnormalities.  T-max 100.9 at 8:15 PM(07/06/2019). MRI of the L-spine consistent with ventral epidural abscess, large subcutaneous fluid collection to the left of the wound extending from L2-L5 approximately 7.1 x 2.9 x 7.5 cm, subcutaneous abscess status post ultrasound-guided drain placement a subcutaneous abscess.  Purulent drainage noted in bulb.  Cultures from abscess preliminary with staph aureus..  Patient underwent TEE on 07/04/2019 with EF of 30 to 35%, mild MR, no vegetations noted.  Continue current IV antibiotics as directed by ID.  Patient now on IV nafcillin.  Per ID.  Drain management per IR.  Per neurosurgery/primary team.  5.  Acute kidney injury with hyponatremia Likely secondary to prerenal azotemia.  Resolved with hydration.  Follow.  6.  Stage II pressure ulcer on the coccyx, POA Continue current wound care.  7.  Anemia of chronic disease Likely dilutional.  No overt bleeding.  Anemia panel consistent with anemia of chronic disease.  Hemoglobin is stable currently at 10.0. Transfusion threshold hemoglobin <7.  8.  Hypokalemia Potassium at 3.9.  We will give a dose of K. Dur 20 mEq p.o. x1 as patient to receive Lasix 40 mg IV x1..  Goal to keep potassium close to 4.  Patient with atrial flutter.  Magnesium at 1.9.  Follow.  9.  Lower extremity edema Lasix 20 mg IV x1.  We will sign off.  Nothing further to add.   DVT prophylaxis: Heparin Code Status: Full Family Communication: Updated patient.  No family at bedside. Disposition:   Status is: Inpatient    Dispo: The patient is from: Home              Anticipated d/c is to: Per primary team.              Anticipated d/c date is: Per primary team.              Patient currently in atrial flutter however rate controlled, on IV antibiotics, with ventral epidural abscess/subcutaneous abscess of the back status post drain placement by IR.  MSSA bacteremia.  Disposition per primary team.           Consultants:   Cardiology: Dr. Cristal Deerhristopher 07/04/2019  ID: Dr. Drue SecondSnider 07/01/2019  [facility] hospitalist: Dr. Katrinka BlazingSmith 06/30/2019  Procedures:   MRI brain 07/04/2019  MRI L-spine 07/04/2019  TEE 07/04/2019  2D echo 07/03/2019  CT head without contrast 07/03/2019  Chest x-ray 06/30/2019  Ultrasound-guided drain placement in the subcutaneous abscess per Dr. Lowella DandyHenn, IR 07/05/2019  Antimicrobials:   IV Ancef 07/01/2019>>>>> 07/03/2019  IV cefepime 06/30/2019>>>> 07/01/2019  IV nafcillin 07/03/2019 >>>>   Subjective: Patient laying in bed.  Alert and oriented to self place and time.  Still with low back pain.  Right-sided buttocks pain improved after drain placement.  Afebrile.  Patient with generalized weakness.    Objective: Vitals:   07/07/19 2021 07/07/19 2350 07/08/19 0327 07/08/19 0425  BP: 137/80 (!) 129/53 (!) 151/62   Pulse: 61  (!) 55   Resp: 20 17 19    Temp: 99 F (37.2 C) 98.2 F (36.8 C) 98.6 F (37 C) 97.9 F (36.6 C)  TempSrc: Oral Oral Oral Oral  SpO2: 100%     Weight:      Height:        Intake/Output Summary (Last 24 hours) at 07/08/2019 [id number] Last data filed  at 07/08/2019 0617 Gross per 24 hour  Intake 605 ml  Output 2057 ml  Net -1452 ml   Filed Weights   06/30/19 1400 07/04/19 0704  Weight: 66.7 kg 66.7 kg    Examination:  General exam: NAD Respiratory system: Lungs clear to auscultation bilaterally anterior lung fields.  No wheezes, no crackles, no rhonchi.  Normal respiratory effort.  Speaking in full sentences.  Cardiovascular system: RRR no murmurs rubs or gallops.  No JVD.  1-2+ lower extremity edema.  Gastrointestinal system: Abdomen is nontender, nondistended, soft, positive bowel sounds.  No rebound.  No guarding.  Subcutaneous drain with purulent material in bulb.  Central nervous system: Alert and oriented. No focal neurological deficits. Extremities: Symmetric 5 x 5 power. Skin: No rashes, lesions or ulcers Psychiatry: Judgement and insight  appear normal. Mood & affect appropriate.     Data Reviewed: I have personally reviewed following labs and imaging studies  CBC: Recent Labs  Lab 07/04/19 0145 07/05/19 0414 07/06/19 0538 07/07/19 0137 07/08/19 0653  WBC 14.0* 13.5* 11.5* 9.6 11.7*  NEUTROABS 12.0* 11.3* 9.7* 7.6 9.5*  HGB 10.8* 8.9* 9.3* 8.4* 10.0*  HCT 32.3* 26.1* 28.3* 25.6* 31.5*  MCV 90.0 88.8 91.0 91.4 94.3  PLT 189 245 292 271 335    Basic Metabolic Panel: Recent Labs  Lab 07/03/19 1159 07/03/19 1159 07/04/19 0145 07/04/19 1330 07/05/19 0414 07/06/19 0538 07/07/19 0137 07/08/19 0653  NA 133*   < > 136  --  132* 134* 137 134*  K 4.1   < > 3.7  --  3.4* 3.2* 3.6 3.9  CL 98   < > 102  --  101 100 104 102  CO2 24   < > 24  --  23 25 25 22   GLUCOSE 155*   < > 181*  --  204* 172* 149* 133*  BUN 23   < > 24*  --  26* 23 21 19   CREATININE 0.88   < > 1.00  --  1.12 1.17 1.16 1.21  CALCIUM 8.3*   < > 8.3*  --  7.5* 7.7* 7.7* 8.1*  MG 1.8  --   --  2.2  --   --  1.9  --    < > = values in this interval not displayed.    GFR: Estimated Creatinine Clearance: 47.5 mL/min (by C-G formula based on SCr of 1.21 mg/dL).  Liver Function Tests: Recent Labs  Lab 07/03/19 1159  AST 42*  ALT 25  ALKPHOS 102  BILITOT 1.1  PROT 5.0*  ALBUMIN 1.9*    CBG: Recent Labs  Lab 07/01/19 1014 07/03/19 1948  GLUCAP 147* 154*     Recent Results (from the past 240 hour(s))  Blood Culture (routine x 2)     Status: Abnormal   Collection Time: 06/30/19 10:55 AM   Specimen: BLOOD RIGHT FOREARM  Result Value Ref Range Status   Specimen Description BLOOD RIGHT FOREARM  Final   Special Requests   Final    BOTTLES DRAWN AEROBIC AND ANAEROBIC Blood Culture adequate volume   Culture  Setup Time   Final    GRAM POSITIVE COCCI IN CLUSTERS IN BOTH AEROBIC AND ANAEROBIC BOTTLES CRITICAL RESULT CALLED TO, READ BACK BY AND VERIFIED WITH: K. AMEND,PHARMD 0210 07/01/2019 T. TYSOR Performed at [facility],  [address], [place], [address] [place]    Culture STAPHYLOCOCCUS AUREUS (A)  Final   Report Status 07/03/2019 FINAL  Final   Organism  ID, Bacteria STAPHYLOCOCCUS AUREUS  Final      Susceptibility   Staphylococcus aureus - MIC*    CIPROFLOXACIN <=0.5 SENSITIVE Sensitive     ERYTHROMYCIN RESISTANT Resistant     GENTAMICIN <=0.5 SENSITIVE Sensitive     OXACILLIN 0.5 SENSITIVE Sensitive     TETRACYCLINE <=1 SENSITIVE Sensitive     VANCOMYCIN 1 SENSITIVE Sensitive     TRIMETH/SULFA <=10 SENSITIVE Sensitive     CLINDAMYCIN RESISTANT Resistant     RIFAMPIN <=0.5 SENSITIVE Sensitive     Inducible Clindamycin POSITIVE Resistant     * STAPHYLOCOCCUS AUREUS  Urine culture     Status: Abnormal   Collection Time: 06/30/19 10:55 AM   Specimen: In/Out Cath Urine  Result Value Ref Range Status   Specimen Description IN/OUT CATH URINE  Final   Special Requests   Final    NONE Performed at [facility], [address], [place], [place] [address]    Culture 60,000 COLONIES/mL STAPHYLOCOCCUS EPIDERMIDIS (A)  Final   Report Status 07/02/2019 FINAL  Final   Organism ID, Bacteria STAPHYLOCOCCUS EPIDERMIDIS (A)  Final      Susceptibility   Staphylococcus epidermidis - MIC*    CIPROFLOXACIN <=0.5 SENSITIVE Sensitive     GENTAMICIN <=0.5 SENSITIVE Sensitive     NITROFURANTOIN <=16 SENSITIVE Sensitive     OXACILLIN >=4 RESISTANT Resistant     TETRACYCLINE 2 SENSITIVE Sensitive     VANCOMYCIN 2 SENSITIVE Sensitive     TRIMETH/SULFA <=10 SENSITIVE Sensitive     CLINDAMYCIN <=0.25 SENSITIVE Sensitive     RIFAMPIN <=0.5 SENSITIVE Sensitive     Inducible Clindamycin NEGATIVE Sensitive     * 60,000 COLONIES/mL STAPHYLOCOCCUS EPIDERMIDIS  Blood Culture ID Panel (Reflexed)     Status: Abnormal   Collection Time: 06/30/19 10:55 AM  Result Value Ref Range Status   Enterococcus species NOT DETECTED NOT DETECTED Final   Listeria monocytogenes NOT DETECTED NOT DETECTED Final   Staphylococcus species  DETECTED (A) NOT DETECTED Final    Comment: CRITICAL RESULT CALLED TO, READ BACK BY AND VERIFIED WITH: K. AMEND,PHARMD 0210 07/01/2019 T. TYSOR    Staphylococcus aureus (BCID) DETECTED (A) NOT DETECTED Final    Comment: Methicillin (oxacillin) susceptible Staphylococcus aureus (MSSA). Preferred therapy is anti staphylococcal beta lactam antibiotic (Cefazolin or Nafcillin), unless clinically contraindicated. CRITICAL RESULT CALLED TO, READ BACK BY AND VERIFIED WITH: K. AMEND,PHARMD 0210 07/01/2019 T. TYSOR    Methicillin resistance NOT DETECTED NOT DETECTED Final   Streptococcus species NOT DETECTED NOT DETECTED Final   Streptococcus agalactiae NOT DETECTED NOT DETECTED Final   Streptococcus pneumoniae NOT DETECTED NOT DETECTED Final   Streptococcus pyogenes NOT DETECTED NOT DETECTED Final   Acinetobacter baumannii NOT DETECTED NOT DETECTED Final   Enterobacteriaceae species NOT DETECTED NOT DETECTED Final   Enterobacter cloacae complex NOT DETECTED NOT DETECTED Final   Escherichia coli NOT DETECTED NOT DETECTED Final   Klebsiella oxytoca NOT DETECTED NOT DETECTED Final   Klebsiella pneumoniae NOT DETECTED NOT DETECTED Final   Proteus species NOT DETECTED NOT DETECTED Final   Serratia marcescens NOT DETECTED NOT DETECTED Final   Haemophilus influenzae NOT DETECTED NOT DETECTED Final   Neisseria meningitidis NOT DETECTED NOT DETECTED Final   Pseudomonas aeruginosa NOT DETECTED NOT DETECTED Final   Candida albicans NOT DETECTED NOT DETECTED Final   Candida glabrata NOT DETECTED NOT DETECTED Final   Candida krusei NOT DETECTED NOT DETECTED Final   Candida parapsilosis NOT DETECTED NOT DETECTED Final  Candida tropicalis NOT DETECTED NOT DETECTED Final    Comment: Performed at [facility], [address] [place], [place] [address]  SARS Coronavirus 2 by RT PCR (hospital order, performed in [facility] hospital lab) Nasopharyngeal Nasopharyngeal Swab     Status: None   Collection  Time: 06/30/19 11:49 AM   Specimen: Nasopharyngeal Swab  Result Value Ref Range Status   SARS Coronavirus 2 NEGATIVE NEGATIVE Final    Comment: (NOTE) SARS-CoV-2 target nucleic acids are NOT DETECTED. The SARS-CoV-2 RNA is generally detectable in upper and lower respiratory specimens during the acute phase of infection. The lowest concentration of SARS-CoV-2 viral copies this assay can detect is 250 copies / mL. A negative result does not preclude SARS-CoV-2 infection and should not be used as the sole basis for treatment or other patient management decisions.  A negative result may occur with improper specimen collection / handling, submission of specimen other than nasopharyngeal swab, presence of viral mutation(s) within the areas targeted by this assay, and inadequate number of viral copies (<250 copies / mL). A negative result must be combined with clinical observations, patient history, and epidemiological information. Fact Sheet for Patients:   BoilerBrush.com.cy Fact Sheet for Healthcare Providers: https://pope.com/ This test is not yet approved or cleared  by the [place] FDA and has been authorized for detection and/or diagnosis of SARS-CoV-2 by FDA under an Emergency Use Authorization (EUA).  This EUA will remain in effect (meaning this test can be used) for the duration of the COVID-19 declaration under Section 564(b)(1) of the Act, 21 U.S.C. section 360bbb-3(b)(1), unless the authorization is terminated or revoked sooner. Performed at [facility], [address], [place], [place] [address]   Blood Culture (routine x 2)     Status: Abnormal   Collection Time: 06/30/19 12:00 PM   Specimen: BLOOD RIGHT WRIST  Result Value Ref Range Status   Specimen Description BLOOD RIGHT WRIST  Final   Special Requests   Final    BOTTLES DRAWN AEROBIC AND ANAEROBIC Blood Culture results may not be optimal due to an inadequate  volume of blood received in culture bottles   Culture  Setup Time   Final    GRAM POSITIVE COCCI IN CLUSTERS IN BOTH AEROBIC AND ANAEROBIC BOTTLES CRITICAL RESULT CALLED TO, READ BACK BY AND VERIFIED WITH: K. AMEND,PHARMD 0210 07/01/2019 T. TYSOR    Culture (A)  Final    STAPHYLOCOCCUS AUREUS SUSCEPTIBILITIES PERFORMED ON PREVIOUS CULTURE WITHIN THE LAST 5 DAYS. Performed at [facility], [address] [place], [place] [address]    Report Status 07/03/2019 FINAL  Final  Aerobic/Anaerobic Culture (surgical/deep wound)     Status: None   Collection Time: 06/30/19  3:42 PM   Specimen: Wound  Result Value Ref Range Status   Specimen Description WOUND SITE NOT SPECIFIED  Final   Special Requests NO ANA SWAB  Final   Gram Stain   Final    NO WBC SEEN MODERATE GRAM POSITIVE COCCI IN PAIRS IN CLUSTERS    Culture   Final    ABUNDANT STAPHYLOCOCCUS AUREUS NO ANAEROBES ISOLATED Performed at [facility], [address] [place], [place] [address]    Report Status 07/07/2019 FINAL  Final   Organism ID, Bacteria STAPHYLOCOCCUS AUREUS  Final      Susceptibility   Staphylococcus aureus - MIC*    CIPROFLOXACIN <=0.5 SENSITIVE Sensitive     ERYTHROMYCIN RESISTANT Resistant     GENTAMICIN <=  0.5 SENSITIVE Sensitive     OXACILLIN <=0.25 SENSITIVE Sensitive     TETRACYCLINE <=1 SENSITIVE Sensitive     VANCOMYCIN <=0.5 SENSITIVE Sensitive     TRIMETH/SULFA <=10 SENSITIVE Sensitive     CLINDAMYCIN RESISTANT Resistant     RIFAMPIN <=0.5 SENSITIVE Sensitive     Inducible Clindamycin POSITIVE Resistant     * ABUNDANT STAPHYLOCOCCUS AUREUS  MRSA PCR Screening     Status: None   Collection Time: 06/30/19 10:27 PM   Specimen: Nasopharyngeal  Result Value Ref Range Status   MRSA by PCR NEGATIVE NEGATIVE Final    Comment:        The GeneXpert MRSA Assay (FDA approved for NASAL specimens only), is one component of a comprehensive MRSA colonization surveillance program. It is  not intended to diagnose MRSA infection nor to guide or monitor treatment for MRSA infections. Performed at [facility], [place] [address], [place], [place] [address]   Aerobic/Anaerobic Culture (surgical/deep wound)     Status: None   Collection Time: 07/01/19 11:54 AM   Specimen: Wound  Result Value Ref Range Status   Specimen Description WOUND  Final   Special Requests NONE  Final   Gram Stain   Final    FEW WBC PRESENT, PREDOMINANTLY PMN ABUNDANT GRAM POSITIVE COCCI    Culture   Final    ABUNDANT STAPHYLOCOCCUS AUREUS NO ANAEROBES ISOLATED Performed at [facility], [address] [place], [place] [address]    Report Status 07/06/2019 FINAL  Final   Organism ID, Bacteria STAPHYLOCOCCUS AUREUS  Final      Susceptibility   Staphylococcus aureus - MIC*    CIPROFLOXACIN <=0.5 SENSITIVE Sensitive     ERYTHROMYCIN RESISTANT Resistant     GENTAMICIN <=0.5 SENSITIVE Sensitive     OXACILLIN 0.5 SENSITIVE Sensitive     TETRACYCLINE <=1 SENSITIVE Sensitive     VANCOMYCIN 1 SENSITIVE Sensitive     TRIMETH/SULFA <=10 SENSITIVE Sensitive     CLINDAMYCIN RESISTANT Resistant     RIFAMPIN <=0.5 SENSITIVE Sensitive     Inducible Clindamycin POSITIVE Resistant     * ABUNDANT STAPHYLOCOCCUS AUREUS  Culture, blood (routine x 2)     Status: None   Collection Time: 07/02/19 12:06 PM   Specimen: BLOOD  Result Value Ref Range Status   Specimen Description BLOOD RIGHT ANTECUBITAL  Final   Special Requests   Final    BOTTLES DRAWN AEROBIC AND ANAEROBIC Blood Culture adequate volume   Culture   Final    NO GROWTH 5 DAYS Performed at [facility], [address], [place], [place] [address]    Report Status 07/07/2019 FINAL  Final  Culture, blood (routine x 2)     Status: None   Collection Time: 07/02/19 12:11 PM   Specimen: BLOOD RIGHT HAND  Result Value Ref Range Status   Specimen Description BLOOD RIGHT HAND  Final   Special Requests   Final    BOTTLES DRAWN AEROBIC  AND ANAEROBIC Blood Culture adequate volume   Culture   Final    NO GROWTH 5 DAYS Performed at [facility], [place] [address] [place], [place] [address]    Report Status 07/07/2019 FINAL  Final  Culture, blood (routine x 2)     Status: None (Preliminary result)   Collection Time: 07/03/19  2:33 PM   Specimen: BLOOD RIGHT HAND  Result Value Ref Range Status   Specimen Description BLOOD RIGHT HAND  Final  Special Requests   Final    BOTTLES DRAWN AEROBIC AND ANAEROBIC Blood Culture adequate volume   Culture   Final    NO GROWTH 4 DAYS Performed at [facility], [address], [place], [place] [address]    Report Status PENDING  Incomplete  Culture, blood (routine x 2)     Status: None (Preliminary result)   Collection Time: 07/03/19  2:37 PM   Specimen: BLOOD RIGHT ARM  Result Value Ref Range Status   Specimen Description BLOOD RIGHT ARM  Final   Special Requests   Final    BOTTLES DRAWN AEROBIC AND ANAEROBIC Blood Culture results may not be optimal due to an excessive volume of blood received in culture bottles   Culture   Final    NO GROWTH 4 DAYS Performed at [facility], [address], [place], [place] [address]    Report Status PENDING  Incomplete  Aerobic/Anaerobic Culture (surgical/deep wound)     Status: None (Preliminary result)   Collection Time: 07/05/19 12:38 PM   Specimen: Abscess  Result Value Ref Range Status   Specimen Description ABSCESS  Final   Special Requests NONE  Final   Gram Stain   Final    MODERATE WBC PRESENT,BOTH PMN AND MONONUCLEAR MODERATE GRAM POSITIVE COCCI IN PAIRS IN CLUSTERS Performed at [facility], [address] [place], [place] [address]    Culture   Final    ABUNDANT STAPHYLOCOCCUS AUREUS NO ANAEROBES ISOLATED; CULTURE IN PROGRESS FOR 5 DAYS    Report Status PENDING  Incomplete   Organism ID, Bacteria STAPHYLOCOCCUS AUREUS  Final      Susceptibility   Staphylococcus aureus - MIC*    CIPROFLOXACIN <=0.5  SENSITIVE Sensitive     ERYTHROMYCIN RESISTANT Resistant     GENTAMICIN <=0.5 SENSITIVE Sensitive     OXACILLIN 0.5 SENSITIVE Sensitive     TETRACYCLINE <=1 SENSITIVE Sensitive     VANCOMYCIN <=0.5 SENSITIVE Sensitive     TRIMETH/SULFA <=10 SENSITIVE Sensitive     CLINDAMYCIN RESISTANT Resistant     RIFAMPIN <=0.5 SENSITIVE Sensitive     Inducible Clindamycin POSITIVE Resistant     * ABUNDANT STAPHYLOCOCCUS AUREUS         Radiology Studies: [place] EKG SITE RITE  Result Date: 07/08/2019 If Site Rite image not attached, placement could not be confirmed due to current cardiac rhythm.       Scheduled Meds: . docusate sodium  100 mg Oral BID  . lisinopril  10 mg Oral Daily  . metoprolol succinate  50 mg Oral Daily  . multivitamin  1 tablet Oral Daily  . PARoxetine  30 mg Oral QPM  . sodium chloride flush  3 mL Intravenous Q12H  . sodium chloride flush  5 mL Intracatheter Q8H  . thiamine  100 mg Oral Daily   Continuous Infusions: . sodium chloride Stopped (07/04/19 0815)  . heparin 1,800 Units/hr (07/07/19 1952)  . nafcillin (NAFCIL) continuous infusion 12 g (07/07/19 1046)     LOS: 8 days    Time spent: 35 minutes    Ramiro Harvest, MD [facility]   To contact the attending provider between 7A-7P or the covering provider during after hours 7P-7A, please log into the web site [email or web address] and access using universal [place] password for that web site. If you do not have the password, please call the hospital operator.  07/08/2019, 8:28 AM

## 2019-07-09 LAB — BASIC METABOLIC PANEL
Anion gap: 9 (ref 5–15)
BUN: 16 mg/dL (ref 8–23)
CO2: 25 mmol/L (ref 22–32)
Calcium: 7.7 mg/dL — ABNORMAL LOW (ref 8.9–10.3)
Chloride: 100 mmol/L (ref 98–111)
Creatinine, Ser: 1.06 mg/dL (ref 0.61–1.24)
GFR calc Af Amer: >60 mL/min (ref >60)
GFR calc non Af Amer: >60 mL/min (ref >60)
Glucose, Bld: 162 mg/dL — ABNORMAL HIGH (ref 70–99)
Potassium: 3.5 mmol/L (ref 3.5–5.1)
Sodium: 134 mmol/L — ABNORMAL LOW (ref 135–145)

## 2019-07-09 LAB — GLUCOSE, CAPILLARY: Glucose-Capillary: 134 mg/dL — ABNORMAL HIGH (ref 70–99)

## 2019-07-09 LAB — HEPARIN LEVEL (UNFRACTIONATED)
Heparin Unfractionated: 0.54 IU/mL (ref 0.30–0.70)
Heparin Unfractionated: 0.58 IU/mL (ref 0.30–0.70)

## 2019-07-09 NOTE — Progress Notes (Signed)
Physical Therapy Treatment Patient Details Name: Neil Cordova MRN: [id number] DOB: 06/02/1940 Today's Date: 07/09/2019    History of Present Illness 79 yo male L4-5 microdiscectomy on 5/11 noted to have fever, purulent drainage, cills an swelling. On 5/21 taken to ED due to confusion left AMA per notes.On 5/24 confusion HTN fevers arrived via ambulance from home. Blood cx grew Mssa and taken to OR 5/25 for wash out with Dr Conchita Paris. PMH depression HTN    PT Comments    Patient progressing with increased distance with ambulation and able to negotiate steps this session.  Still needs consistent cues for back precautions.  Able to walk some without walker, but will get RW for home use.  Would like to have wife in for training prior to d/c.  PT to follow.    Follow Up Recommendations  Home health PT;Supervision/Assistance - 24 hour     Equipment Recommendations  Rolling walker with 5" wheels;3in1 (PT)    Recommendations for Other Services       Precautions / Restrictions Precautions Precautions: Fall;Back    Mobility  Bed Mobility Overal bed mobility: Needs Assistance   Rolling: Supervision Sidelying to sit: Supervision     Sit to sidelying: Supervision General bed mobility comments: cues throughout for sequence/abiding by precautions  Transfers Overall transfer level: Needs assistance Equipment used: Rolling walker (2 wheeled) Transfers: Sit to/from Stand Sit to Stand: Supervision         General transfer comment: up from EOB no A needed  Ambulation/Gait Ambulation/Gait assistance: Supervision Gait Distance (Feet): 450 Feet Assistive device: Rolling walker (2 wheeled);None Gait Pattern/deviations: Step-through pattern;Decreased stride length     General Gait Details: intermittent cues for posture, in room able to move without walker short distance to bed   Stairs Stairs: Yes Stairs assistance: Supervision Stair Management: Step to pattern;Forwards;One rail  Right Number of Stairs: 3 General stair comments: cues for step to sequence, assist for lines, but pt able to negotiate without LOB   Wheelchair Mobility    Modified Rankin (Stroke Patients Only)       Balance Overall balance assessment: Needs assistance   Sitting balance-Leahy Scale: Good       Standing balance-Leahy Scale: Good Standing balance comment: able to walk steps in room without AD                            Cognition Arousal/Alertness: Awake/alert Behavior During Therapy: WFL for tasks assessed/performed Overall Cognitive Status: Impaired/Different from baseline Area of Impairment: Memory;Awareness                     Memory: Decreased short-term memory;Decreased recall of precautions     Awareness: Emergent   General Comments: aware of conversation had with MD and wife today and plan for home, reports likely will be Friday      Exercises      General Comments General comments (skin integrity, edema, etc.): remains with condom catheter      Pertinent Vitals/Pain Pain Assessment: Faces Faces Pain Scale: Hurts little more Pain Location: back Pain Descriptors / Indicators: Discomfort;Grimacing Pain Intervention(s): Monitored during session;Repositioned    Home Living                      Prior Function            PT Goals (current goals can now be found in the care plan section) Progress towards PT  goals: Progressing toward goals    Frequency    Min 5X/week      PT Plan Current plan remains appropriate    Co-evaluation              AM-PAC PT "6 Clicks" Mobility   Outcome Measure  Help needed turning from your back to your side while in a flat bed without using bedrails?: None Help needed moving from lying on your back to sitting on the side of a flat bed without using bedrails?: None Help needed moving to and from a bed to a chair (including a wheelchair)?: A Little Help needed standing up from a  chair using your arms (e.g., wheelchair or bedside chair)?: None Help needed to walk in hospital room?: A Little Help needed climbing 3-5 steps with a railing? : A Little 6 Click Score: 21    End of Session Equipment Utilized During Treatment: Gait belt Activity Tolerance: Patient tolerated treatment well Patient left: in bed;with call bell/phone within reach;with bed alarm set   PT Visit Diagnosis: Unsteadiness on feet (R26.81);Muscle weakness (generalized) (M62.81);Other symptoms and signs involving the nervous system (R29.898);Pain Pain - part of body: (back)     Time: 0102-7253 PT Time Calculation (min) (ACUTE ONLY): 25 min  Charges:  $Gait Training: 23-37 mins                     Neil Cordova, [place] [facility] [phone] 07/09/2019    Reginia Naas 07/09/2019, 4:53 PM

## 2019-07-09 NOTE — Progress Notes (Signed)
  NEUROSURGERY PROGRESS NOTE   No issues overnight.  Complains of LBP attributable to not getting up and moving yet this am. The severe LBP from pre op has largely resolved.  EXAM:  BP 134/61 (BP Location: Left Arm)   Pulse 66   Temp 98.8 F (37.1 C) (Oral)   Resp (!) 21   Ht 5\' 10"  (1.778 m)   Wt 66.7 kg   SpO2 99%   BMI 21.10 kg/m   Awake, alert, oriented  Speech fluent, appropriate  CN grossly intact  MAEW   IMPRESSION/PLAN 79 y.o. male with lumbar infection with MSSA s/p discectomy. Steadily improving on Nafcillin. -Will cont subcutaneous drain - Cont Nafcillin - Cont Toprol for aflutter with outpatient cards f/u  Discharge planning: patient has progressed amazingly and does not require intensive 3 hour rehab at discharge. I discussed this with 71, CIR, yesterday who was present during the patient's PT session. I relayed this to the patient again this am, who agrees he does not need intensive 3 hour a day rehab. He feels comfortable going home with [facility] OT/PT and nurses aide to help facilitate safety. Will discuss this wife, as she was adamant yesterday about CIR at discharge. I agree patient is doing too well for CIR and SNF at discharge.

## 2019-07-09 NOTE — Progress Notes (Signed)
PROGRESS NOTE  Neil Cordova IEP:[id number] DOB: January 05, 1941 DOA: 06/30/2019 PCP: Patient, No Pcp Per  Brief History   Patient was initially consulted on May 24 for assistance in medical management. At that point patient had a postoperative wound infection, status post left L4-L5 microdissection on 5/11. He also had metabolic encephalopathy, hypotension, hyponatremia and acute kidney injury.  Recommendations were to continue close neurologic monitoring, continue intravenous antibiotics and IV fluids.  His blood cultures grew MSSAandpatient was taken to the OR on 5/25 for washout, finding a deep surgical site infection that did not track into the epidural space. Infectious disease was consulted, recommendations to change antibiotic therapy to cefazolin and obtain further cardiac images to rule out endocarditis. His echocardiogram performed today had no evidence of valvular vegetations,and the plan is to obtain a transesophageal echocardiogram in the morning.  Patient continued to be encephalopathic and not back to baseline, TRH was reconsulted for further recommendations.  Consultants  . Cardiology . Infectious disease . [facility]  Procedures  . TEE 07/04/2019 . Ultrasound guided drain placement in the subcutaneous abscess by IR 07/05/2019.  Antibiotics   Anti-infectives (From admission, onward)   Start     Dose/Rate Route Frequency Ordered Stop   07/04/19 1030  nafcillin 12 g in sodium chloride 0.9 % 500 mL continuous infusion     12 g 20.8 mL/hr over 24 Hours Intravenous Every 24 hours 07/04/19 0851     07/03/19 1600  nafcillin 2 g in sodium chloride 0.9 % 100 mL IVPB  Status:  Discontinued     2 g 200 mL/hr over 30 Minutes Intravenous Every 4 hours 07/03/19 1412 07/04/19 0851   07/01/19 1240  bacitracin 50,000 Units in sodium chloride 0.9 % 500 mL irrigation  Status:  Discontinued       As needed 07/01/19 1240 07/01/19 1245   07/01/19 1030  ceFAZolin (ANCEF) IVPB  2g/100 mL premix  Status:  Discontinued     2 g 200 mL/hr over 30 Minutes Intravenous Every 8 hours 07/01/19 0942 07/03/19 1412   07/01/19 0900  ceFEPIme (MAXIPIME) 2 g in sodium chloride 0.9 % 100 mL IVPB  Status:  Discontinued     2 g 200 mL/hr over 30 Minutes Intravenous Every 12 hours 07/01/19 0837 07/01/19 0942   06/30/19 1430  vancomycin (VANCOREADY) IVPB 1250 mg/250 mL  Status:  Discontinued     1,250 mg 166.7 mL/hr over 90 Minutes Intravenous Every 24 hours 06/30/19 1403 07/01/19 0942   06/30/19 1430  ceFEPIme (MAXIPIME) 2 g in sodium chloride 0.9 % 100 mL IVPB  Status:  Discontinued     2 g 200 mL/hr over 30 Minutes Intravenous Every 12 hours 06/30/19 1403 07/01/19 0837    .   Subjective  The patient is resting comfortably. No new complaints.  Objective   Vitals:  Vitals:   07/09/19 1147 07/09/19 1520  BP: 132/67 117/61  Pulse: (!) 47 66  Resp: 20 18  Temp: 98.4 F (36.9 C) 98.9 F (37.2 C)  SpO2: 100% 100%   Exam:  Constitutional:  . The patient is awake, alert, and oriented x 3. Mild distress from low back pain. Respiratory:  . No increased work of breathing. . No wheezes, rales, or rhonchi . No tactile fremitus Cardiovascular:  . Regular rate and rhythm . No murmurs, ectopy, or gallups. . No lateral PMI. No thrills. Abdomen:  . Abdomen is soft, non-tender, non-distended . No hernias, masses, or organomegaly . Normoactive bowel sounds.  Musculoskeletal:  .  No cyanosis or clubbing . 1-2+ pitting edema Skin:  . No rashes, lesions, ulcers . palpation of skin: no induration or nodules Neurologic:  . CN 2-12 intact . Sensation all 4 extremities intact Psychiatric:  . Mental status o Mood, affect appropriate o Orientation to person, place, time  . judgment and insight appear intact  I have personally reviewed the following:   Today's Data  . Vitals, BMP  Imaging  . VFI:EPPIRJJOA atelectasis and/or small layering pleural effusions  Scheduled  Meds: . Chlorhexidine Gluconate Cloth  6 each Topical Daily  . docusate sodium  100 mg Oral BID  . lisinopril  10 mg Oral Daily  . metoprolol succinate  50 mg Oral Daily  . multivitamin  1 tablet Oral Daily  . PARoxetine  30 mg Oral QPM  . sodium chloride flush  10-40 mL Intracatheter Q12H  . sodium chloride flush  3 mL Intravenous Q12H  . sodium chloride flush  5 mL Intracatheter Q8H  . thiamine  100 mg Oral Daily   Continuous Infusions: . sodium chloride Stopped (07/04/19 0815)  . heparin 1,750 Units/hr (07/09/19 1205)  . nafcillin (NAFCIL) continuous infusion 12 g (07/09/19 1210)    Principal Problem:   Wound infection after surgery Active Problems:   Hyponatremia   Pressure injury of skin   Acute metabolic encephalopathy   Delirium   Atrial flutter (HCC)   Acute combined systolic and diastolic congestive heart failure (HCC)   Bacteremia   SIRS (systemic inflammatory response syndrome) (HCC)   Epidural abscess   Abscess   LOS: 9 days   A & P   Acute metabolic encephalopathy with delirium: Likely secondary to acute illness with bacteremia/epidural abscess/subcutaneous abscess.  Patient initially noted to be lethargic and somnolent and woke up to pain and touch.  Patient also noted to be confused and disoriented.  Mentation has improved.  Alert and oriented x3.  Likely at baseline.  Ammonia level at 9.  Head CT negative.  MRI head negative.  Continue supportive care.  Continue IV antibiotics.  Alprazolam has been discontinued. Continue Haldol as needed for agitation.  Continue thiamine, multivitamin.  Follow.   New onset atrial flutter:  Patient noted to be in new onset atrial flutter by EKG however currently rate controlled on current regimen of metoprolol twice daily.  CHA2DS2-VASc score is 3 due to age and hypertension, ?? CM.  Currently on IV heparin.  2D echo obtained with EF of 50 to 55%, no wall motion abnormalities, left atrial size mild to moderately dilated, mild  mitral valve regurgitation.  Patient underwent TEE(07/04/2019) which was negative for vegetation, EF of 30 to 35%, moderate to severe LV dysfunction.  TSH within normal limits at 1.052.  Cardiology was consulted and are recommending continuation of beta-blocker.  Patient currently on Toprol-XL 50 mg daily.  Continue IV heparin and once no further procedures deemed necessary could likely transition to Eliquis.  Outpatient follow-up with cardiology.  Cardiomyopathy: 2D echo obtained with EF of 50 to 55%, no wall motion abnormalities, left atrial size mild to moderately dilated, mild mitral valve regurgitation.  Patient underwent TEE (07/04/2019) which was negative for vegetation, EF of 30 to 35%, moderate to severe LV dysfunction.  Patient seen in consultation by cardiology and continuation of beta-blocker, ACE inhibitor resumed, outpatient follow-up for further evaluation 3 to 4 weeks post discharge.  Per cardiology.  Surgical wound infection/MSSA bacteremia/ventral epidural abscess per MRI(07/04/2019)/large subcutaneous fluid collection to the left of the wound probable abscess(per  MRI L-spine 07/04/2019): Patient noted to have encephalopathy however seems to be improving daily. Patient with new onset atrial flutter and as such due to worsening encephalopathy and bacteremia MRI brain was done which was negative for any acute abnormalities. T-max 100.9 at 8:15 PM(07/06/2019). MRI of the L-spine consistent with ventral epidural abscess, large subcutaneous fluid collection to the left of the wound extending from L2-L5 approximately 7.1 x 2.9 x 7.5 cm, subcutaneous abscess status post ultrasound-guided drain placement a subcutaneous abscess. Purulent drainage noted in bulb.  Cultures from abscess preliminary with staph aureus. Patient underwent TEE on 07/04/2019 with EF of 30 to 35%, mild MR, no vegetations noted. Continue current IV antibiotics as directed by ID.  Patient now on IV nafcillin. Per ID. Drain management  per IR.   Acute kidney injury with hyponatremia: Likely secondary to prerenal azotemia.  Resolved with hydration.  Follow.  Stage II pressure ulcer on the coccyx, POA: Continue current wound care.  Anemia of chronic disease: Likely dilutional. No overt bleeding. Anemia panel consistent with anemia of chronic disease. Hemoglobin is stable currently at 10.0. Transfusion threshold hemoglobin <7.  Hypokalemia: Potassium at 3.9.  We will give a dose of K. Dur 20 mEq p.o. x1 as patient to receive Lasix 40 mg IV x1. Goal to keep potassium close to 4. Patient with atrial flutter. Magnesium at 1.9. Follow.  Lower extremity edema: Continue diuresis. Monitor volume status.  We will sign off.  Nothing further to add.   DVT prophylaxis: Heparin Code Status: Full Family Communication: Updated patient.  No family at bedside. Disposition:   Status is: Inpatient    Dispo: The patient is from: Home  Anticipated d/c is to: Per primary team.  Anticipated d/c date is: Per primary team.  Patient currently in atrial flutter however rate controlled, on IV antibiotics, with ventral epidural abscess/subcutaneous abscess of the back status post drain placement by IR.  MSSA bacteremia.  Disposition per primary team.   Fran Lowes, DO [facility] Direct contact: see [email or web address]  7PM-7AM contact night coverage as above 07/09/2019, 4:10 PM  LOS: 9 days

## 2019-07-09 NOTE — Progress Notes (Signed)
Occupational Therapy Treatment Patient Details Name: Neil Cordova MRN: [id number] DOB: 1940-11-25 Today's Date: 07/09/2019    History of present illness 79 yo male L4-5 microdiscectomy on 5/11 noted to have fever, purulent drainage, cills an swelling. On 5/21 taken to ED due to confusion left AMA per notes.On 5/24 confusion HTN fevers arrived via ambulance from home. Blood cx grew Mssa and taken to OR 5/25 for wash out with Dr Conchita Paris. PMH depression HTN   OT comments  Pt demonstrates some cognitive deficits this session but much improved activity tolerance. Pt states "didn't I walk with you yesterday?" Pt needs cues throughout session for adls with back precautions. Pt has progressed past CIR level and will need follow up at home possibly outpatient. Wife not present at this time.   Follow Up Recommendations  Home health OT(could progress to Outpatient very soon)    Equipment Recommendations  Other (comment)(RW)    Recommendations for Other Services      Precautions / Restrictions Precautions Precautions: Fall;Back       Mobility Bed Mobility Overal bed mobility: Needs Assistance Bed Mobility: Rolling;Supine to Sit Rolling: Min guard   Supine to sit: Min guard     General bed mobility comments: pt requires mod cues to sequence to proper back precautions  Transfers Overall transfer level: Needs assistance Equipment used: Rolling walker (2 wheeled) Transfers: Sit to/from Stand Sit to Stand: Min guard         General transfer comment: pt needs cues for posture while in Rw and to push the RW instead of lifting it.     Balance Overall balance assessment: Needs assistance Sitting-balance support: No upper extremity supported;Feet supported Sitting balance-Leahy Scale: Good     Standing balance support: No upper extremity supported;During functional activity Standing balance-Leahy Scale: Good Standing balance comment: able to stand and apply mask while adjusting  glasses                           ADL either performed or assessed with clinical judgement   ADL Overall ADL's : Needs assistance/impaired Eating/Feeding: Set up;Sitting   Grooming: Minimal assistance;Standing               Lower Body Dressing: Minimal assistance;Sit to/from stand Lower Body Dressing Details (indicate cue type and reason): pt does demonstrate ability to complete a figure 4 without sustaining the position at this time. pt reaches ankle well but could need help with socks Toilet Transfer: Min guard;RW     Toileting - Clothing Manipulation Details (indicate cue type and reason): pt currently with condom foley and encourage staff to allow patient to have waking hour transfers to teh bathroom. pt states i think i would have accident if i didnt have it but i dont know why i have that thing     Functional mobility during ADLs: Min guard;Rolling walker General ADL Comments: pt reports discomfort and not exiting the bed yet today. pt reports only pain in hip area with movement and decr overall back pain. Tech agreeable to ambulating patient later today     Vision       Perception     Praxis      Cognition Arousal/Alertness: Awake/alert Behavior During Therapy: WFL for tasks assessed/performed Overall Cognitive Status: Impaired/Different from baseline Area of Impairment: Memory;Awareness                     Memory: Decreased short-term memory;Decreased recall of precautions  Awareness: Emergent   General Comments: Pt expressed concerns with return to running and reports his motivation has been focused on his next pair of shoes. pt with poor recall of back precautions.         Exercises     Shoulder Instructions       General Comments drain L side and attached to gown with yellow drainage noted. Pt tolerates RA this session with stable VSS    Pertinent Vitals/ Pain       Pain Assessment: Faces Faces Pain Scale: Hurts little  more Pain Location: back Pain Descriptors / Indicators: Discomfort;Grimacing Pain Intervention(s): Monitored during session;Repositioned(pt reports pain gone just soreness with walking)  Home Living                                          Prior Functioning/Environment              Frequency  Min 3X/week        Progress Toward Goals  OT Goals(current goals can now be found in the care plan section)  Progress towards OT goals: Progressing toward goals  Acute Rehab OT Goals Patient Stated Goal: to get a new pair of running shoes OT Goal Formulation: With patient Time For Goal Achievement: 07/23/19 Potential to Achieve Goals: Good ADL Goals Pt Will Perform Grooming: with set-up;standing Pt Will Perform Upper Body Bathing: with modified independence;sitting Pt Will Perform Lower Body Bathing: with modified independence;sit to/from stand;with adaptive equipment Pt Will Transfer to Toilet: with modified independence;ambulating;bedside commode Additional ADL Goal #1: pt will recall back precautions mod I with bed mobility  Plan Discharge plan needs to be updated    Co-evaluation                 AM-PAC OT "6 Clicks" Daily Activity     Outcome Measure   Help from another person eating meals?: A Little Help from another person taking care of personal grooming?: A Little Help from another person toileting, which includes using toliet, bedpan, or urinal?: A Little Help from another person bathing (including washing, rinsing, drying)?: A Little Help from another person to put on and taking off regular upper body clothing?: A Little Help from another person to put on and taking off regular lower body clothing?: A Little 6 Click Score: 18    End of Session Equipment Utilized During Treatment: Rolling walker;Gait belt  OT Visit Diagnosis: Unsteadiness on feet (R26.81);Muscle weakness (generalized) (M62.81);Pain   Activity Tolerance Patient tolerated  treatment well   Patient Left in chair;with call bell/phone within reach;with chair alarm set;with nursing/sitter in room(tech arriving for blood sugars at conclusion of session)   Nurse Communication Mobility status;Precautions        Time: 5638-7564 OT Time Calculation (min): 24 min  Charges: OT General Charges $OT Visit: 1 Visit OT Treatments $Therapeutic Activity: 8-22 mins   Neil Cordova, OTR/L  Acute Rehabilitation Services Pager: [phone] Office: [phone] .    Neil Cordova 07/09/2019, 12:21 PM

## 2019-07-09 NOTE — Progress Notes (Signed)
Inpatient Rehabilitation Admissions Coordinator  Per discussion with Vinnie, PA, plans are to d/c home with [facility]. We will sign off at this time.  Ottie Glazier, RN, MSN Rehab Admissions Coordinator [phone] 07/09/2019 4:09 PM

## 2019-07-09 NOTE — Progress Notes (Signed)
ANTICOAGULATION CONSULT NOTE - Follow Up Consult  Pharmacy Consult for IV Heparin Indication: atrial fibrillation  No Known Allergies  Patient Measurements: Height: 5\' 10"  (177.8 cm) Weight: 66.7 kg (147 lb 0.8 oz) IBW/kg (Calculated) : 73 Heparin Dosing Weight: 66.7 kg  Vital Signs: Temp: 100.6 F (38.1 C) (06/02 2024) Temp Source: Oral (06/02 2024) BP: 124/51 (06/02 2000) Pulse Rate: 66 (06/02 1520)  Labs: Recent Labs    07/07/19 0137 07/07/19 0137 07/08/19 0653 07/09/19 0959 07/09/19 2016  HGB 8.4*  --  10.0*  --   --   HCT 25.6*  --  31.5*  --   --   PLT 271  --  335  --   --   HEPARINUNFRC 0.49   < > 0.43 0.54 0.58  CREATININE 1.16  --  1.21 1.06  --    < > = values in this interval not displayed.    Estimated Creatinine Clearance: 54.2 mL/min (by C-G formula based on SCr of 1.06 mg/dL).  Assessment: 79 yr old male admitted on 06/30/19 with AMS and wound drainage following L4-5 microdiscectomy at outpt surgical center on 06/17/19; he was diagnosed with lumbar wound infection and MSSA bacteremia (S/P washout on 5/25).   Pt with new atrial fibrillation; pharmacy was consulted to dose heparin (no bolus due to recent neurosurgery); will use lower heparin level goal (0.3-0.5 units/ml), as well, due to recent neurosurgery.  Heparin level this PM remains slightly SUPRAtherapeutic at 0.58 and up from last level despite decrease in rate to 1750 units/hr. No active bleed issues reported per RN. JP drain site ok.  CBC uptrending 6/1, none taken 6/2. No issues with infusion.   Goal of Therapy:  Heparin level: 0.3-0.5 units/ml  Monitor platelets by anticoagulation protocol: Yes   Plan:  Reduce Heparin drip slightly to 1500 units/hr (15 ml/hr) Will continue to monitor for any signs/symptoms of bleeding and will follow up with heparin level in 8 hours   Thank you for allowing pharmacy to be a part of this patient's care.  8/2, PharmD, BCPS, BCCCP Clinical  Pharmacist Please refer to [facility] for [facility] Pharmacy numbers 07/09/2019 9:20 PM

## 2019-07-09 NOTE — Progress Notes (Signed)
ANTICOAGULATION CONSULT NOTE - Follow Up Consult  Pharmacy Consult for IV Heparin Indication: atrial fibrillation  No Known Allergies  Patient Measurements: Height: 5\' 10"  (177.8 cm) Weight: 66.7 kg (147 lb 0.8 oz) IBW/kg (Calculated) : 73 Heparin Dosing Weight: 66.7 kg  Vital Signs: Temp: 97.8 F (36.6 C) (06/02 0428) Temp Source: Oral (06/02 0428) BP: 156/57 (06/02 0428) Pulse Rate: 63 (06/02 0428)  Labs: Recent Labs    07/06/19 1616 07/07/19 0137 07/08/19 0653  HGB  --  8.4* 10.0*  HCT  --  25.6* 31.5*  PLT  --  271 335  HEPARINUNFRC 0.38 0.49 0.43  CREATININE  --  1.16 1.21    Estimated Creatinine Clearance: 47.5 mL/min (by C-G formula based on SCr of 1.21 mg/dL).  Assessment: 79 yr old male admitted on 06/30/19 with AMS and wound drainage following L4-5 microdiscectomy at outpt surgical center on 06/17/19; he was diagnosed with lumbar wound infection and MSSA bacteremia (S/P washout on 5/25).   Pt with new atrial fibrillation; pharmacy was consulted to dose heparin (no bolus due to recent neurosurgery); will use lower heparin level goal (0.3-0.5 units/ml), as well, due to recent neurosurgery.  Heparin level today is slightly SUPRAtherapeutic at 0.54. No active bleed issues reported. CBC uptrending 6/1, none taken 6/2.   Goal of Therapy:  Heparin level: 0.3-0.5 units/ml  Monitor platelets by anticoagulation protocol: Yes   Plan:  Reduce Heparin drip slightly to 1750 units/hr (17.5 ml/hr) Will continue to monitor for any signs/symptoms of bleeding and will follow up with heparin level in 8 hours   Thank you for allowing pharmacy to be a part of this patient's care.  8/2, PharmD, BCPS Clinical Pharmacist Clinical phone for 07/09/2019: 09/08/2019 07/09/2019 7:47 AM   **Pharmacist phone directory can now be found on [email or web address] ([email or web address]).  Listed under [facility].

## 2019-07-09 NOTE — Plan of Care (Signed)

## 2019-07-10 ENCOUNTER — Telehealth: Payer: Self-pay | Admitting: Cardiology

## 2019-07-10 ENCOUNTER — Other Ambulatory Visit: Payer: Self-pay | Admitting: Physician Assistant

## 2019-07-10 DIAGNOSIS — T8149XA Infection following a procedure, other surgical site, initial encounter: Secondary | ICD-10-CM

## 2019-07-10 LAB — HEPARIN LEVEL (UNFRACTIONATED): Heparin Unfractionated: 0.28 IU/mL — ABNORMAL LOW (ref 0.30–0.70)

## 2019-07-10 LAB — AEROBIC/ANAEROBIC CULTURE W GRAM STAIN (SURGICAL/DEEP WOUND)

## 2019-07-10 LAB — BASIC METABOLIC PANEL
Anion gap: 10 (ref 5–15)
BUN: 16 mg/dL (ref 8–23)
CO2: 23 mmol/L (ref 22–32)
Calcium: 7.7 mg/dL — ABNORMAL LOW (ref 8.9–10.3)
Chloride: 101 mmol/L (ref 98–111)
Creatinine, Ser: 1.01 mg/dL (ref 0.61–1.24)
GFR calc Af Amer: >60 mL/min (ref >60)
GFR calc non Af Amer: >60 mL/min (ref >60)
Glucose, Bld: 114 mg/dL — ABNORMAL HIGH (ref 70–99)
Potassium: 3.4 mmol/L — ABNORMAL LOW (ref 3.5–5.1)
Sodium: 134 mmol/L — ABNORMAL LOW (ref 135–145)

## 2019-07-10 MED ORDER — APIXABAN 5 MG PO TABS
5.0000 mg | ORAL_TABLET | Freq: Two times a day (BID) | ORAL | Status: DC
  Administered 2019-07-10: 5 mg via ORAL
  Filled 2019-07-10: qty 1

## 2019-07-10 MED ORDER — NAFCILLIN IV (FOR PTA / DISCHARGE USE ONLY): 12.0000 g | INTRAVENOUS | 0 refills | Status: DC

## 2019-07-10 MED ORDER — APIXABAN 5 MG PO TABS: 5.0000 mg | ORAL_TABLET | Freq: Two times a day (BID) | ORAL | 2 refills | Status: DC

## 2019-07-10 NOTE — Discharge Summary (Signed)
Physician Discharge Summary  Patient ID: Neil Cordova MRN: [id number] DOB/AGE: 02/07/1940 79 y.o.  Admit date: 06/30/2019 Discharge date: 07/10/2019  Admission Diagnoses:  Wound infection after surgery Encephalopathy SIRs bacteremia  Discharge Diagnoses:  Same Principal Problem:   Wound infection after surgery Active Problems:   Hyponatremia   Pressure injury of skin   Acute metabolic encephalopathy   Delirium   Atrial flutter (HCC)   Acute combined systolic and diastolic congestive heart failure (HCC)   Bacteremia   SIRS (systemic inflammatory response syndrome) (HCC)   Epidural abscess   Abscess   Discharged Condition: Stable  Hospital Course:  Neil Cordova is a 79 y.o. male who presented to the ED 06/30/2019 with altered mental status and lumbar wound drainage approximately 2 weeks s/p left L4-5 microdiscectomy. Patient was seen and evaluated and admitted for further work up and management. Full SIRs work up performed including blood cultures.  He underwent lumbar wound debridement and washout on 5/25. Wound cultures and blood cultures positive for MSSA. ID consulted for reccomendations. Initially patient was started on ancef, but due to lack of improvement, he was switched from ancef to nafcillin per ID. ID also obtained an MRI of the lumbar spine which revealed a large subcutaneous abscess as well as lumbar epidural abscess, discitis ostemyelitis. The decision was made to have IR place a drain to help reduce the infection burden rather than take the patient back to the OR. He underwent IR guided aspiration with placement of drain on 5/29.   His post operative course was complicated by new onset Aflutter. Cardiology was consulted, rec starting toprol BID, heparin with eventual transition to Eliquis and outpatient follow up.  Patient's encephalopathy gradually improved. He worked with PT/OT throughout his hospital stay and progressed well enough to be able to be discharged  home with [facility]/PT/Nursing.  Treatments: Surgery  5/25: lumbar wound washout 5/29: IR guided placement drain  Discharge Exam: Blood pressure 140/72, pulse 65, temperature 98.3 F (36.8 C), temperature source Oral, resp. rate 18, height _0  (1.778 m), weight 66.7 kg, SpO2 96 %. Awake, alert, oriented Speech fluent, appropriate CN grossly intact MAEW IR drain in place Wound c/d/i  Disposition: Discharge disposition: 01-Home or Self Care       Discharge Instructions     [facility] pharmacist to adjust dose for Vancomycin, Aminoglycosides and other anti-infective therapies as requested by physician.   Complete by: As directed    [facility] to provide Cath Flo 65m   Complete by: As directed    Administer for PICC line occlusion and as ordered by physician for other access device issues.   Anaphylaxis Kit: Provided to treat any anaphylactic reaction to the medication being provided to the patient if First Dose or when requested by physician   Complete by: As directed    Epinephrine 126mml vial / amp: Administer 0.40m30m0.40ml25mubcutaneously once for moderate to severe anaphylaxis, nurse to call physician and pharmacy when reaction occurs and call 911 if needed for immediate care   Diphenhydramine 50mg47mIV vial: Administer 25-50mg 61mM PRN for first dose reaction, rash, itching, mild reaction, nurse to call physician and pharmacy when reaction occurs   Sodium Chloride 0.9% NS 500ml I49mdminister if needed for hypovolemic blood pressure drop or as ordered by physician after call to physician with anaphylactic reaction   Call MD for:  difficulty breathing, headache or visual disturbances   Complete by: As directed    Call MD for:  persistant dizziness or light-headedness   Complete by: As directed    Call MD for:  redness, tenderness, or signs of infection (pain, swelling, redness, odor or green/yellow discharge around incision site)   Complete by: As  directed    Call MD for:  severe uncontrolled pain   Complete by: As directed    Call MD for:  temperature >100.4   Complete by: As directed    Change dressing on IV access line weekly and PRN   Complete by: As directed    Diet general   Complete by: As directed    Driving Restrictions   Complete by: As directed    Do not drive until given clearance.   Flush IV access with Sodium Chloride 0.9% and Heparin 10 units/ml or 100 units/ml   Complete by: As directed    Home infusion instructions - [facility]   Complete by: As directed    Instructions: Flush IV access with Sodium Chloride 0.9% and Heparin 10units/ml or 100units/ml   Change dressing on IV access line: Weekly and PRN   Instructions Cath Flo 86m: Administer for PICC Line occlusion and as ordered by physician for other access device   [facility] pharmacist to adjust dose for: Vancomycin, Aminoglycosides and other anti-infective therapies as requested by physician   Increase activity slowly   Complete by: As directed    Lifting restrictions   Complete by: As directed    Do not lift anything >10lbs. Avoid bending and twisting in awkward positions. Avoid bending at the back.   May shower / Bathe   Complete by: As directed    In 24 hours. Okay to wash wound with warm soapy water. Avoid scrubbing the wound. Pat dry.   Method of administration may be changed at the discretion of home infusion pharmacist based upon assessment of the patient and/or caregiver's ability to self-administer the medication ordered   Complete by: As directed    Remove dressing in 24 hours   Complete by: As directed       Allergies as of 07/10/2019   No Known Allergies      Medication List     TAKE these medications    acetaminophen 325 MG tablet Commonly known as: TYLENOL Take 325 mg by mouth every 6 (six) hours as needed for moderate pain.   ALPRAZolam 1 MG tablet Commonly known as: XANAX Take 1 mg by mouth at  bedtime.   apixaban 5 MG Tabs tablet Commonly known as: ELIQUIS Take 1 tablet (5 mg total) by mouth 2 (two) times daily.   fluticasone 50 MCG/ACT nasal spray Commonly known as: FLONASE Place 1 spray into both nostrils daily as needed for allergies or rhinitis.   ibuprofen 200 MG tablet Commonly known as: ADVIL Take 200 mg by mouth every 6 (six) hours as needed for mild pain or moderate pain.   lisinopril 10 MG tablet Commonly known as: ZESTRIL Take 10 mg by mouth every evening.   loratadine 10 MG tablet Commonly known as: CLARITIN Take 10 mg by mouth daily as needed for allergies.   nafcillin  IVPB Inject 12 g into the vein daily. As a continuous infusion via PICC line.Indication:  MSSA bacteremia/discitis/ventral epidural abscess First Dose: Yes Last Day of Therapy:  08/17/2019 Labs - Twice weekly:  CBC/D and BMP, Labs - Every other week:  ESR and CRP Method of administration: Elastomeric (Continuous infusion) Method of administration may be changed at the discretion of home infusion pharmacist  based upon assessment of the patient and/or caregiver's ability to self-administer the medication ordered.   oxyCODONE-acetaminophen 7.5-325 MG tablet Commonly known as: PERCOCET Take 1 tablet by mouth every 4 (four) hours as needed for severe pain.   PARoxetine 30 MG tablet Commonly known as: PAXIL Take 30 mg by mouth every evening.   psyllium 58.6 % packet Commonly known as: METAMUCIL Take 1 packet by mouth daily as needed (constipation).   sildenafil 100 MG tablet Commonly known as: VIAGRA Take 100 mg by mouth daily as needed for erectile dysfunction.   Turmeric 500 MG Tabs Take 500 mg by mouth daily.               Durable Medical Equipment  (From admission, onward)           Start     Ordered   07/10/19 1155  DME Walker  Once    Question Answer Comment  Walker: With [place] Wheels   Patient needs a walker to treat with the following condition Gait  instability      07/10/19 1159   07/10/19 1155  DME 3-in-1  Once     07/10/19 1159              Discharge Care Instructions  (From admission, onward)           Start     Ordered   07/10/19 0000  Change dressing on IV access line weekly and PRN  (Home infusion instructions - [facility] )     07/10/19 1159           Follow-up Information     Sande Rives E, PA-C Follow up.   Specialties: Physician Assistant, Cardiology Why: Follow-up scheduled for 08/01/2019 at 12:00pm. Please arrive 15 minutes early for check-in. If this date/time does not work for you, please call our office to reschedule.  Contact information: [place] [address] [phone]         Consuella Lose, MD. Schedule an appointment as soon as possible for a visit in 2 week(s).   Specialty: Neurosurgery Contact information: [address] [place] [address] [place] [address] [phone]            Signed: Traci Sermon 07/10/2019, 12:00 PM

## 2019-07-10 NOTE — TOC Benefit Eligibility Note (Signed)
Transition of Care [facility]) Benefit Eligibility Note    Patient Details  Name: Neil Cordova MRN: [id number] Date of Birth: 05/28/1940   Medication/Dose: Eliquis 5mg . bid.  Covered?: Yes  Tier: 3 Drug  Prescription Coverage [facility]erred [facility]  Spoke with Person/Company/Phone Number:: Maurisha L. W/[employer] @855 [phone]  Co-Pay: $43.00  Prior Approval: No  Deductible: Unmet       Phone Number: 07/10/2019, 12:56 PM

## 2019-07-10 NOTE — Care Management (Signed)
Patients primary care MD is in [place] He does see cariology here Marjie Skiff PA,. Reached out to that office to see if she can sign orders for home health

## 2019-07-10 NOTE — Telephone Encounter (Signed)
Judeth Cornfield with Care Management is requesting to speak with Neil Cordova in regards to home health. She states she is requesting an order. Please return call to discuss at [phone].

## 2019-07-10 NOTE — Discharge Instructions (Signed)

## 2019-07-10 NOTE — Telephone Encounter (Addendum)
Neil Cordova. Dr. Cristal Deer and I only saw this patient once in the hospital. He was admitted with sepsis secondary to a lumbar wound infection after a recent back surgery. So I really think Neurosurgery should be the ones to order this.

## 2019-07-10 NOTE — Telephone Encounter (Signed)
Called back home health care management-  She states that the patient just moved here, has not had an established pcp yet- but he states he seen Callie in the hospital, and would like to know if she would sign for his home health assistance for PT/OT/RN.   I advised with her that I would send a message back to see if she would and would let them know.   Will route to [facility].

## 2019-07-10 NOTE — Progress Notes (Signed)
Physical Therapy Treatment Patient Details Name: Neil Cordova MRN: [id number] DOB: 05-17-1940 Today's Date: 07/10/2019    History of Present Illness 79 yo male L4-5 microdiscectomy on 5/11 noted to have fever, purulent drainage, cills an swelling. On 5/21 taken to ED due to confusion left AMA per notes.On 5/24 confusion HTN fevers arrived via ambulance from home. Blood cx grew Mssa and taken to OR 5/25 for wash out with Dr Conchita Paris. PMH depression HTN    PT Comments    Patient progressing with mobility and wife and daughter present for education regarding supervision level of assistance and cues needed for safety.  Patient stable for home with family support and follow up HHPT.   Follow Up Recommendations  Home health PT;Supervision/Assistance - 24 hour     Equipment Recommendations  Rolling walker with 5" wheels;3in1 (PT)    Recommendations for Other Services       Precautions / Restrictions Precautions Precautions: Fall;Back    Mobility  Bed Mobility Overal bed mobility: Needs Assistance   Rolling: Supervision Sidelying to sit: Supervision       General bed mobility comments: initial cue for precautions  Transfers Overall transfer level: Needs assistance Equipment used: Rolling walker (2 wheeled) Transfers: Sit to/from Stand Sit to Stand: Supervision         General transfer comment: up from EOB no A needed  Ambulation/Gait Ambulation/Gait assistance: Supervision Gait Distance (Feet): 300 Feet Assistive device: Rolling walker (2 wheeled) Gait Pattern/deviations: Step-through pattern;Decreased stride length     General Gait Details: cues for posture, wife and daughter in room to observe patient's progress, level of assist and for education regarding precautions   Stairs   Stairs assistance: Supervision Stair Management: One rail Right;Step to pattern;Forwards Number of Stairs: 3 General stair comments: assist for lines, safety, wife and daughter  present and educated in Theme park manager    Modified Rankin (Stroke Patients Only)       Balance Overall balance assessment: Needs assistance Sitting-balance support: No upper extremity supported;Feet supported Sitting balance-Leahy Scale: Good       Standing balance-Leahy Scale: Fair                              Cognition Arousal/Alertness: Awake/alert Behavior During Therapy: WFL for tasks assessed/performed Overall Cognitive Status: Impaired/Different from baseline Area of Impairment: Memory                     Memory: Decreased short-term memory;Decreased recall of precautions Following Commands: Follows one step commands consistently;Follows one step commands with increased time              Exercises      General Comments General comments (skin integrity, edema, etc.): Discussed car transfers      Pertinent Vitals/Pain Pain Assessment: Faces Faces Pain Scale: Hurts little more Pain Location: back Pain Descriptors / Indicators: Discomfort;Grimacing;Tightness Pain Intervention(s): Monitored during session;Repositioned    Home Living                      Prior Function            PT Goals (current goals can now be found in the care plan section) Progress towards PT goals: Progressing toward goals    Frequency    Min 5X/week      PT Plan Current plan remains appropriate    Co-evaluation  AM-PAC PT "6 Clicks" Mobility   Outcome Measure  Help needed turning from your back to your side while in a flat bed without using bedrails?: None Help needed moving from lying on your back to sitting on the side of a flat bed without using bedrails?: None Help needed moving to and from a bed to a chair (including a wheelchair)?: A Little Help needed standing up from a chair using your arms (e.g., wheelchair or bedside chair)?: None Help needed to walk in hospital room?: A Little Help needed  climbing 3-5 steps with a railing? : A Little 6 Click Score: 21    End of Session   Activity Tolerance: Patient tolerated treatment well Patient left: in chair;with nursing/sitter in room;with family/visitor present   PT Visit Diagnosis: Unsteadiness on feet (R26.81);Muscle weakness (generalized) (M62.81);Other symptoms and signs involving the nervous system (R29.898);Pain     Time: 1425-1452 PT Time Calculation (min) (ACUTE ONLY): 27 min  Charges:  $Gait Training: 8-22 mins $Self Care/Home Management: [place] [phone] 07/10/2019    Reginia Naas 07/10/2019, 7:36 PM

## 2019-07-10 NOTE — Progress Notes (Signed)
ANTICOAGULATION CONSULT NOTE - Follow Up Consult  Pharmacy Consult for IV Heparin Indication: atrial fibrillation  No Known Allergies  Patient Measurements: Height: 5\' 10"  (177.8 cm) Weight: 66.7 kg (147 lb 0.8 oz) IBW/kg (Calculated) : 73 Heparin Dosing Weight: 66.7 kg  Vital Signs: Temp: 98.4 F (36.9 C) (06/03 0400) Temp Source: Oral (06/03 0400) BP: 159/58 (06/03 0400) Pulse Rate: 66 (06/03 0400)  Labs: Recent Labs    07/08/19 0653 07/08/19 0653 07/09/19 0959 07/09/19 2016 07/10/19 0610  HGB 10.0*  --   --   --   --   HCT 31.5*  --   --   --   --   PLT 335  --   --   --   --   HEPARINUNFRC 0.43   < > 0.54 0.58 0.28*  CREATININE 1.21  --  1.06  --  1.01   < > = values in this interval not displayed.    Estimated Creatinine Clearance: 56.9 mL/min (by C-G formula based on SCr of 1.01 mg/dL).  Assessment: 79 yr old male admitted on 06/30/19 with AMS and wound drainage following L4-5 microdiscectomy at outpt surgical center on 06/17/19; he was diagnosed with lumbar wound infection and MSSA bacteremia (S/P washout on 5/25).   Pt with new atrial fibrillation; pharmacy was consulted to dose heparin (no bolus due to recent neurosurgery); will use lower heparin level goal (0.3-0.5 units/ml), as well, due to recent neurosurgery.  Repeat lvl this am now below goal  Goal of Therapy:  Heparin level: 0.3-0.5 units/ml  Monitor platelets by anticoagulation protocol: Yes   Plan:  Increase heparin to 1550 units/hr Recheck at 1500  6/25, PharmD, BCPS, BCCCP Clinical Pharmacist [phone]  Please check AMION for all [facility] Pharmacy numbers  07/10/2019 7:05 AM

## 2019-07-10 NOTE — Care Management (Signed)
Spoke with wife on the phone per patient request to get home health choice. Will print out choices for her and leave at bediside . She will be coming in . She states that Medicare A & B should be listed as first payor on his chart. It is in his documents. # [phone]

## 2019-07-10 NOTE — Care Management (Signed)
Eligibility for Eliquis Sent

## 2019-07-10 NOTE — Progress Notes (Signed)
Occupational Therapy Treatment Patient Details Name: Neil Cordova MRN: [id number] DOB: 10-04-1940 Today's Date: 07/10/2019    History of present illness 79 yo male L4-5 microdiscectomy on 5/11 noted to have fever, purulent drainage, cills an swelling. On 5/21 taken to ED due to confusion left AMA per notes.On 5/24 confusion HTN fevers arrived via ambulance from home. Blood cx grew Mssa and taken to OR 5/25 for wash out with Dr Conchita Paris. PMH depression HTN   OT comments  Patient continues to make steady progress towards goals in skilled OT session. Patient's session encompassed re-iteration of back precautions, ADLs, and assessment of cognitive abilities. Pt is progressing with movement and ADLs, however from a cognitive standpoint, pt continues to demonstrate deficits. Pt is not fully oriented, is unable to state back precautions independently, deficits noted in basic sequencing tasks stating "I have to wash my hands" but then never looked for soap and required orientation to task to complete. Pt also cued to notifiy therapist when done in the bathroom, not 45 seconds later pt was ambulating off of commode independently. Education to provided later this afternoon by PT for wife to promote safe discharge home; will continue to follow acutely.    Follow Up Recommendations  Home health OT    Equipment Recommendations  Other (comment)(RW)    Recommendations for Other Services      Precautions / Restrictions Precautions Precautions: Fall;Back Precaution Booklet Issued: No Precaution Comments: When prompted, pt still is unable to verbalize precautions without therapst listing each and having pt explain them stating "my wife keeps beating those into my head" Restrictions Weight Bearing Restrictions: No       Mobility Bed Mobility Overal bed mobility: Needs Assistance Bed Mobility: Rolling;Supine to Sit Rolling: Supervision Sidelying to sit: Supervision       General bed mobility  comments: cues throughout for sequence/abiding by precautions  Transfers Overall transfer level: Needs assistance Equipment used: Rolling walker (2 wheeled) Transfers: Sit to/from Stand Sit to Stand: Supervision         General transfer comment: up from EOB no A needed    Balance Overall balance assessment: Needs assistance Sitting-balance support: No upper extremity supported;Feet supported Sitting balance-Leahy Scale: Good Sitting balance - Comments: min-modA at edge of bed   Standing balance support: Bilateral upper extremity supported;During functional activity Standing balance-Leahy Scale: Fair                             ADL either performed or assessed with clinical judgement   ADL Overall ADL's : Needs assistance/impaired     Grooming: Wash/dry hands;Wash/dry face;Cueing for sequencing;Standing Grooming Details (indicate cue type and reason): Forgot the steps as to how to wash his hands                 Toilet Transfer: Min guard;RW;Cueing for sequencing;Cueing for safety;Grab bars   Toileting- Clothing Manipulation and Hygiene: Min guard Toileting - Clothing Manipulation Details (indicate cue type and reason): pt able to have BM, however stood without letting therapist know despite cues not a minute prior to signify when done     Functional mobility during ADLs: Min guard;Rolling walker General ADL Comments: Pt is progressing from a mobility standpoint, however cognitively is still impaired with major deficits noted     Vision       Perception     Praxis      Cognition Arousal/Alertness: Awake/alert Behavior During Therapy: [facility] for tasks assessed/performed  Overall Cognitive Status: Impaired/Different from baseline Area of Impairment: Memory;Awareness;Orientation;Following commands;Safety/judgement;Problem solving;Attention                 Orientation Level: Disoriented to;Time Current Attention Level: Selective Memory:  Decreased short-term memory;Decreased recall of precautions Following Commands: Follows one step commands inconsistently Safety/Judgement: Decreased awareness of deficits;Decreased awareness of safety Awareness: Emergent Problem Solving: Slow processing;Requires verbal cues;Requires tactile cues;Difficulty sequencing General Comments: Pt continues to demonstrate deficits in cognition, is not fully oriented, and is unable to state back precautions indepdendently, deficits noted in basic sequencing tasks stating "I have to wash my hands" but then never looked for soap and required orientation to task to complete. Pt also cued to notifiy therapist when done in the bathroom, not 45 seconds later pt was ambulating off of commode independently.        Exercises     Shoulder Instructions       General Comments      Pertinent Vitals/ Pain       Pain Assessment: 0-10 Pain Score: 4  Pain Location: back Pain Descriptors / Indicators: Discomfort;Grimacing Pain Intervention(s): Limited activity within patient's tolerance;Monitored during session;Repositioned  Home Living                                          Prior Functioning/Environment              Frequency  Min 3X/week        Progress Toward Goals  OT Goals(current goals can now be found in the care plan section)  Progress towards OT goals: Progressing toward goals  Acute Rehab OT Goals Patient Stated Goal: None stated Time For Goal Achievement: 07/23/19 Potential to Achieve Goals: Good  Plan Discharge plan needs to be updated    Co-evaluation                 AM-PAC OT "6 Clicks" Daily Activity     Outcome Measure   Help from another person eating meals?: A Little Help from another person taking care of personal grooming?: A Little Help from another person toileting, which includes using toliet, bedpan, or urinal?: A Little Help from another person bathing (including washing, rinsing,  drying)?: A Little Help from another person to put on and taking off regular upper body clothing?: A Little Help from another person to put on and taking off regular lower body clothing?: A Little 6 Click Score: 18    End of Session Equipment Utilized During Treatment: Rolling walker;Gait belt  OT Visit Diagnosis: Unsteadiness on feet (R26.81);Muscle weakness (generalized) (M62.81);Pain Pain - part of body: (back)   Activity Tolerance Patient tolerated treatment well   Patient Left in chair;with call bell/phone within reach;with chair alarm set   Nurse Communication Mobility status;Precautions(Cognitive status)        Time: 2951-8841 OT Time Calculation (min): 25 min  Charges: OT General Charges $OT Visit: 1 Visit OT Treatments $Self Care/Home Management : 23-37 mins  Corinne Ports E. [place], [place] [facility] [place] 07/10/2019, 2:22 PM

## 2019-07-10 NOTE — Progress Notes (Signed)
Referring Physician(s): Nundkumar  Supervising Physician: Jacqulynn Cadet  Patient Status:  [facility] - In-pt  Chief Complaint:  Post op infection  Brief History:  Marcellous, Snarski 01-26-2041 had a microdissection L4-L5 by Nundkumar on 5/11. Had post op wound infection. Taken back to OR for washout 5/25 and had placement of a drain by [facility] on 5/29.   Subjective:  Eager to go home.  Allergies: Patient has no known allergies.  Medications: Prior to Admission medications   Medication Sig Start Date End Date Taking? Authorizing Provider  acetaminophen (TYLENOL) 325 MG tablet Take 325 mg by mouth every 6 (six) hours as needed for moderate pain.   Yes [provider]  ALPRAZolam Duanne Moron) 1 MG tablet Take 1 mg by mouth at bedtime.   Yes [provider]  fluticasone (FLONASE) 50 MCG/ACT nasal spray Place 1 spray into both nostrils daily as needed for allergies or rhinitis.   Yes [provider]  ibuprofen (ADVIL) 200 MG tablet Take 200 mg by mouth every 6 (six) hours as needed for mild pain or moderate pain.   Yes [provider]  lisinopril (ZESTRIL) 10 MG tablet Take 10 mg by mouth every evening.   Yes [provider]  loratadine (CLARITIN) 10 MG tablet Take 10 mg by mouth daily as needed for allergies.   Yes [provider]  oxyCODONE-acetaminophen (PERCOCET) 7.5-325 MG tablet Take 1 tablet by mouth every 4 (four) hours as needed for severe pain.   Yes [provider]  PARoxetine (PAXIL) 30 MG tablet Take 30 mg by mouth every evening.   Yes [provider]  psyllium (METAMUCIL) 58.6 % packet Take 1 packet by mouth daily as needed (constipation).   Yes [provider]  sildenafil (VIAGRA) 100 MG tablet Take 100 mg by mouth daily as needed for erectile dysfunction.   Yes [provider]  Turmeric 500 MG TABS Take 500 mg by mouth daily.   Yes [provider]     Vital Signs: BP 140/72 (BP  Location: Left Arm)   Pulse 65   Temp 98.3 F (36.8 C) (Oral)   Resp 18   Ht 5\' 10"  (1.778 m)   Wt 66.7 kg   SpO2 96%   BMI 21.10 kg/m   Physical Exam Constitutional:      Appearance: Normal appearance.  HENT:     Head: Normocephalic and atraumatic.  Cardiovascular:     Rate and Rhythm: Normal rate.  Pulmonary:     Effort: Pulmonary effort is normal. No respiratory distress.  Abdominal:     Palpations: Abdomen is soft.     Tenderness: There is no abdominal tenderness.  Musculoskeletal:     Comments: Drain in place but still has copious purulent output, 120-140 daily.  Skin:    General: Skin is warm and dry.  Neurological:     General: No focal deficit present.     Mental Status: He is alert and oriented to person, place, and time.  Psychiatric:        Mood and Affect: Mood normal.        Behavior: Behavior normal.        Thought Content: Thought content normal.        Judgment: Judgment normal.     Imaging: DG Chest Port 1 View  Result Date: 07/08/2019 CLINICAL DATA:  Right PICC placement EXAM: PORTABLE CHEST 1 VIEW COMPARISON:  06/30/2019 FINDINGS: Interval placement of right upper extremity PICC, tip positioned near the  superior cavoatrial junction. The heart size and mediastinal contours are within normal limits. There may be bibasilar atelectasis and/or small layering pleural effusions. The visualized skeletal structures are unremarkable. IMPRESSION: 1. Interval placement of right upper extremity PICC, tip positioned near the superior cavoatrial junction. 2. There may be bibasilar atelectasis and/or small layering pleural effusions on AP portable radiograph, consider PA and lateral radiographs to further evaluate. Electronically Signed   By: Lauralyn Primes M.D.   On: 07/08/2019 11:51   [place] EKG SITE RITE  Result Date: 07/08/2019 If Site Rite image not attached, placement could not be confirmed due to current cardiac rhythm.   Labs:  CBC: Recent Labs    07/05/19 0414  07/06/19 0538 07/07/19 0137 07/08/19 0653  WBC 13.5* 11.5* 9.6 11.7*  HGB 8.9* 9.3* 8.4* 10.0*  HCT 26.1* 28.3* 25.6* 31.5*  PLT 245 292 271 335    COAGS: Recent Labs    06/30/19 1055  INR 1.1  APTT 27    BMP: Recent Labs    07/07/19 0137 07/08/19 0653 07/09/19 0959 07/10/19 0610  NA 137 134* 134* 134*  K 3.6 3.9 3.5 3.4*  CL 104 102 100 101  CO2 25 22 25 23   GLUCOSE 149* 133* 162* 114*  BUN 21 19 16 16   CALCIUM 7.7* 8.1* 7.7* 7.7*  CREATININE 1.16 1.21 1.06 1.01  GFRNONAA 60* 57* >60 >60  GFRAA >60 >60 >60 >60    LIVER FUNCTION TESTS: Recent Labs    06/26/19 2020 06/30/19 1055 07/03/19 1159  BILITOT 1.6* 1.1 1.1  AST 17 29 42*  ALT 19 23 25   ALKPHOS 49 76 102  PROT 6.5 5.3* 5.0*  ALBUMIN 3.6 2.3* 1.9*    Assessment and Plan:  S/P microdissection L4-L5 by Nundkumar on 5/11.   Had post op wound infection. Taken back to OR for washout 07/01/19  Placement of a drain by [facility] on 5/29.  Still with significant cloudy yellow output.  Continue drain. Ok to D/C with drain in place. I have written orders for nursing to demonstrate drain care with flushes.  IR drain clinic will call patient with appointment date and time for follow up CT scan and drain evaluation.  Electronically Signed: 7/11, PA-C 07/10/2019, 9:55 AM    I spent a total of 15 Minutes at the the patient's bedside AND on the patient's hospital floor or unit, greater than 50% of which was counseling/coordinating care for f/u drain.

## 2019-07-10 NOTE — Progress Notes (Signed)
Pt and Pt's wife given d/c instructions and all questions answered. Pt and wife educate don JP drain flushes daily and dressing changes PRN. 3 in 1 and walker delivered to room prior to D/C. Printed prescription for antibiotic placed in packet. Pts IVs removed and Pt d/c'd with PICC in place per order. Pt taken to car with all belongings.

## 2019-07-10 NOTE — Progress Notes (Signed)
ANTICOAGULATION CONSULT NOTE - Follow Up Consult  Pharmacy Consult for IV Heparin >> Apixaban Indication: atrial fibrillation  No Known Allergies  Patient Measurements: Height: 5\' 10"  (177.8 cm) Weight: 66.7 kg (147 lb 0.8 oz) IBW/kg (Calculated) : 73 Heparin Dosing Weight: 66.7 kg  Vital Signs: Temp: 98.3 F (36.8 C) (06/03 0803) Temp Source: Oral (06/03 0803) BP: 140/72 (06/03 0803) Pulse Rate: 65 (06/03 0803)  Labs: Recent Labs    07/08/19 09/07/19 07/08/19 0653 07/09/19 0959 07/09/19 2016 07/10/19 0610  HGB 10.0*  --   --   --   --   HCT 31.5*  --   --   --   --   PLT 335  --   --   --   --   HEPARINUNFRC 0.43   < > 0.54 0.58 0.28*  CREATININE 1.21  --  1.06  --  1.01   < > = values in this interval not displayed.    Estimated Creatinine Clearance: 56.9 mL/min (by C-G formula based on SCr of 1.01 mg/dL).  Assessment: 79 yr old male admitted on 06/30/19 with AMS and wound drainage following L4-5 microdiscectomy at outpt surgical center on 06/17/19; he was diagnosed with lumbar wound infection and MSSA bacteremia (S/P washout on 5/25).   Pt with new atrial fibrillation; pharmacy was consulted to dose heparin (no bolus due to recent neurosurgery) and now with plans to transition to Apixaban in anticipation of discharge.   Given age<80, wt>60kg, SCr<1.5 - will initiate Apixaban 5 mg bid  Goal of Therapy:  Heparin level: 0.3-0.5 units/ml  Monitor platelets by anticoagulation protocol: Yes   Plan:  D/c Heparin Start Apixaban 5 mg bid AVS in chart, education done with wife  Thank you for allowing pharmacy to be a part of this patient's care.  6/25, PharmD, BCPS Clinical Pharmacist Clinical phone for 07/10/2019: 09/09/2019 07/10/2019 9:11 AM   **Pharmacist phone directory can now be found on [email or web address] ([email or web address]).  Listed under [facility].

## 2019-07-11 ENCOUNTER — Telehealth: Payer: Self-pay

## 2019-07-11 NOTE — Telephone Encounter (Signed)
Called Neil Cordova to  Inquire if he will act as primary for the patient as far as home health goes for orders. He stated they can call the office [phone] and they will  Be available for orders with home health.

## 2019-07-11 NOTE — Telephone Encounter (Signed)
Spoke to [employer]  With advanced infusion  She is coordinating with Home health services, ID and  This care coordinator to provide holistic care to Mr Recupero. She is aware of neurosurgery as point for orders with home health and Frances Furbish is aware as well, patients HH company. She will call patent and wife

## 2019-07-11 NOTE — Telephone Encounter (Signed)
Called and spoke to [place] regarding the  message from [place]- She will reach out to Neuro and ask them.   No other questions at this time.

## 2019-07-11 NOTE — Telephone Encounter (Signed)
No RN available at times needs from [place] will be going with [facility], Becky Sax point of contact. Pam will be teaching daughter and Wife IV infusion ABX for today via tele zoom .

## 2019-07-14 ENCOUNTER — Other Ambulatory Visit: Payer: Self-pay | Admitting: Neurosurgery

## 2019-07-14 ENCOUNTER — Other Ambulatory Visit: Payer: Self-pay | Admitting: Specialist

## 2019-07-14 DIAGNOSIS — T8149XA Infection following a procedure, other surgical site, initial encounter: Secondary | ICD-10-CM

## 2019-07-17 NOTE — Telephone Encounter (Signed)
Completely agree.  Thank you.

## 2019-07-18 NOTE — Telephone Encounter (Signed)
Scheduled patient for hospital follow up 6/23 2:00 with Dr Drue Second. Andree Coss, RN

## 2019-07-18 NOTE — Telephone Encounter (Signed)
If he has truly gained 30lbs in about 1 week, he needs to be seen by a provider because he likely does need a diuretic. However, we only saw him once in the hospital 2 weeks ago (at which time he was not volume overloaded at all) and have never seen him in the office so I don't really feel comfortable ordering anything without having seen and examined him again. I would defer to the physician who is writing home health orders until we can see him. Can we see if we can move up his appointment to early next week? If not, I recommend he see his PCP. How do his lungs sound? If he is develops any shortness of breath, especially at rest, before then he should go to the ED.  Thank you!

## 2019-07-22 ENCOUNTER — Inpatient Hospital Stay (HOSPITAL_COMMUNITY)
Admission: EM | Admit: 2019-07-22 | Discharge: 2019-07-28 | DRG: 308 | Disposition: A | Discharge status: Home Health | Payer: Medicare Other | Attending: Internal Medicine | Admitting: Internal Medicine

## 2019-07-22 ENCOUNTER — Encounter (HOSPITAL_COMMUNITY): Payer: Self-pay | Admitting: Emergency Medicine

## 2019-07-22 ENCOUNTER — Emergency Department (HOSPITAL_COMMUNITY): Payer: Medicare Other

## 2019-07-22 DIAGNOSIS — D500 Iron deficiency anemia secondary to blood loss (chronic): Secondary | ICD-10-CM

## 2019-07-22 DIAGNOSIS — I110 Hypertensive heart disease with heart failure: Secondary | ICD-10-CM | POA: Diagnosis present

## 2019-07-22 DIAGNOSIS — G062 Extradural and subdural abscess, unspecified: Secondary | ICD-10-CM | POA: Diagnosis present

## 2019-07-22 DIAGNOSIS — D638 Anemia in other chronic diseases classified elsewhere: Secondary | ICD-10-CM | POA: Diagnosis present

## 2019-07-22 DIAGNOSIS — L03115 Cellulitis of right lower limb: Secondary | ICD-10-CM | POA: Diagnosis present

## 2019-07-22 DIAGNOSIS — T8143XA Infection following a procedure, organ and space surgical site, initial encounter: Secondary | ICD-10-CM | POA: Diagnosis present

## 2019-07-22 DIAGNOSIS — R7881 Bacteremia: Secondary | ICD-10-CM

## 2019-07-22 DIAGNOSIS — D649 Anemia, unspecified: Secondary | ICD-10-CM

## 2019-07-22 DIAGNOSIS — I444 Left anterior fascicular block: Secondary | ICD-10-CM | POA: Diagnosis present

## 2019-07-22 DIAGNOSIS — Z95828 Presence of other vascular implants and grafts: Secondary | ICD-10-CM

## 2019-07-22 DIAGNOSIS — I5043 Acute on chronic combined systolic (congestive) and diastolic (congestive) heart failure: Secondary | ICD-10-CM | POA: Diagnosis present

## 2019-07-22 DIAGNOSIS — I5021 Acute systolic (congestive) heart failure: Secondary | ICD-10-CM

## 2019-07-22 DIAGNOSIS — Z79899 Other long term (current) drug therapy: Secondary | ICD-10-CM

## 2019-07-22 DIAGNOSIS — L03116 Cellulitis of left lower limb: Secondary | ICD-10-CM | POA: Diagnosis present

## 2019-07-22 DIAGNOSIS — Z7901 Long term (current) use of anticoagulants: Secondary | ICD-10-CM | POA: Diagnosis not present

## 2019-07-22 DIAGNOSIS — I340 Nonrheumatic mitral (valve) insufficiency: Secondary | ICD-10-CM | POA: Diagnosis not present

## 2019-07-22 DIAGNOSIS — I471 Supraventricular tachycardia: Secondary | ICD-10-CM | POA: Diagnosis not present

## 2019-07-22 DIAGNOSIS — I4892 Unspecified atrial flutter: Principal | ICD-10-CM | POA: Diagnosis present

## 2019-07-22 DIAGNOSIS — R600 Localized edema: Secondary | ICD-10-CM | POA: Diagnosis not present

## 2019-07-22 DIAGNOSIS — Z20822 Contact with and (suspected) exposure to covid-19: Secondary | ICD-10-CM | POA: Diagnosis present

## 2019-07-22 DIAGNOSIS — R739 Hyperglycemia, unspecified: Secondary | ICD-10-CM | POA: Diagnosis present

## 2019-07-22 DIAGNOSIS — I10 Essential (primary) hypertension: Secondary | ICD-10-CM | POA: Diagnosis not present

## 2019-07-22 DIAGNOSIS — R7303 Prediabetes: Secondary | ICD-10-CM | POA: Diagnosis present

## 2019-07-22 DIAGNOSIS — Z87891 Personal history of nicotine dependence: Secondary | ICD-10-CM

## 2019-07-22 DIAGNOSIS — Z825 Family history of asthma and other chronic lower respiratory diseases: Secondary | ICD-10-CM | POA: Diagnosis not present

## 2019-07-22 DIAGNOSIS — Q211 Atrial septal defect: Secondary | ICD-10-CM | POA: Diagnosis not present

## 2019-07-22 DIAGNOSIS — I429 Cardiomyopathy, unspecified: Secondary | ICD-10-CM | POA: Diagnosis present

## 2019-07-22 DIAGNOSIS — I483 Typical atrial flutter: Secondary | ICD-10-CM | POA: Diagnosis not present

## 2019-07-22 DIAGNOSIS — B9561 Methicillin susceptible Staphylococcus aureus infection as the cause of diseases classified elsewhere: Secondary | ICD-10-CM

## 2019-07-22 DIAGNOSIS — L0291 Cutaneous abscess, unspecified: Secondary | ICD-10-CM

## 2019-07-22 DIAGNOSIS — I509 Heart failure, unspecified: Secondary | ICD-10-CM

## 2019-07-22 LAB — COMPREHENSIVE METABOLIC PANEL
ALT: <5 U/L (ref 0–44)
AST: 13 U/L — ABNORMAL LOW (ref 15–41)
Albumin: 2.1 g/dL — ABNORMAL LOW (ref 3.5–5.0)
Alkaline Phosphatase: 76 U/L (ref 38–126)
Anion gap: 10 (ref 5–15)
BUN: 16 mg/dL (ref 8–23)
CO2: 24 mmol/L (ref 22–32)
Calcium: 8.5 mg/dL — ABNORMAL LOW (ref 8.9–10.3)
Chloride: 102 mmol/L (ref 98–111)
Creatinine, Ser: 1.26 mg/dL — ABNORMAL HIGH (ref 0.61–1.24)
GFR calc Af Amer: >60 mL/min (ref >60)
GFR calc non Af Amer: 54 mL/min — ABNORMAL LOW (ref >60)
Glucose, Bld: 201 mg/dL — ABNORMAL HIGH (ref 70–99)
Potassium: 4.2 mmol/L (ref 3.5–5.1)
Sodium: 136 mmol/L (ref 135–145)
Total Bilirubin: 0.8 mg/dL (ref 0.3–1.2)
Total Protein: 5.9 g/dL — ABNORMAL LOW (ref 6.5–8.1)

## 2019-07-22 LAB — URINALYSIS, ROUTINE W REFLEX MICROSCOPIC
Bacteria, UA: NONE SEEN
Bilirubin Urine: NEGATIVE
Glucose, UA: NEGATIVE mg/dL
Ketones, ur: NEGATIVE mg/dL
Leukocytes,Ua: NEGATIVE
Nitrite: NEGATIVE
Protein, ur: NEGATIVE mg/dL
Specific Gravity, Urine: 1.011 (ref 1.005–1.030)
pH: 7 (ref 5.0–8.0)

## 2019-07-22 LAB — CBC WITH DIFFERENTIAL/PLATELET
Abs Immature Granulocytes: 0.02 10*3/uL (ref 0.00–0.07)
Basophils Absolute: 0.1 10*3/uL (ref 0.0–0.1)
Basophils Relative: 1 %
Eosinophils Absolute: 0.2 10*3/uL (ref 0.0–0.5)
Eosinophils Relative: 3 %
HCT: 28.1 % — ABNORMAL LOW (ref 39.0–52.0)
Hemoglobin: 8.6 g/dL — ABNORMAL LOW (ref 13.0–17.0)
Immature Granulocytes: 0 %
Lymphocytes Relative: 15 %
Lymphs Abs: 1 10*3/uL (ref 0.7–4.0)
MCH: 29.8 pg (ref 26.0–34.0)
MCHC: 30.6 g/dL (ref 30.0–36.0)
MCV: 97.2 fL (ref 80.0–100.0)
Monocytes Absolute: 0.5 10*3/uL (ref 0.1–1.0)
Monocytes Relative: 8 %
Neutro Abs: 4.7 10*3/uL (ref 1.7–7.7)
Neutrophils Relative %: 73 %
Platelets: 247 10*3/uL (ref 150–400)
RBC: 2.89 MIL/uL — ABNORMAL LOW (ref 4.22–5.81)
RDW: 15.5 % (ref 11.5–15.5)
WBC: 6.4 10*3/uL (ref 4.0–10.5)
nRBC: 0 % (ref 0.0–0.2)

## 2019-07-22 LAB — BRAIN NATRIURETIC PEPTIDE: B Natriuretic Peptide: 559.2 pg/mL — ABNORMAL HIGH (ref 0.0–100.0)

## 2019-07-22 LAB — LACTIC ACID, PLASMA
Lactic Acid, Venous: 1 mmol/L (ref 0.5–1.9)
Lactic Acid, Venous: 1.6 mmol/L (ref 0.5–1.9)

## 2019-07-22 LAB — SARS CORONAVIRUS 2 BY RT PCR (HOSPITAL ORDER, PERFORMED IN ~~LOC~~ HOSPITAL LAB): SARS Coronavirus 2: NEGATIVE

## 2019-07-22 IMAGING — CR DG CHEST 2V
2 series · 2 of 2 positions shown · non-contrast
Comparison: Chest x-ray dated [DATE].

CLINICAL DATA: Lower extremity swelling.

EXAM:
CHEST - 2 VIEW

[chest lat]
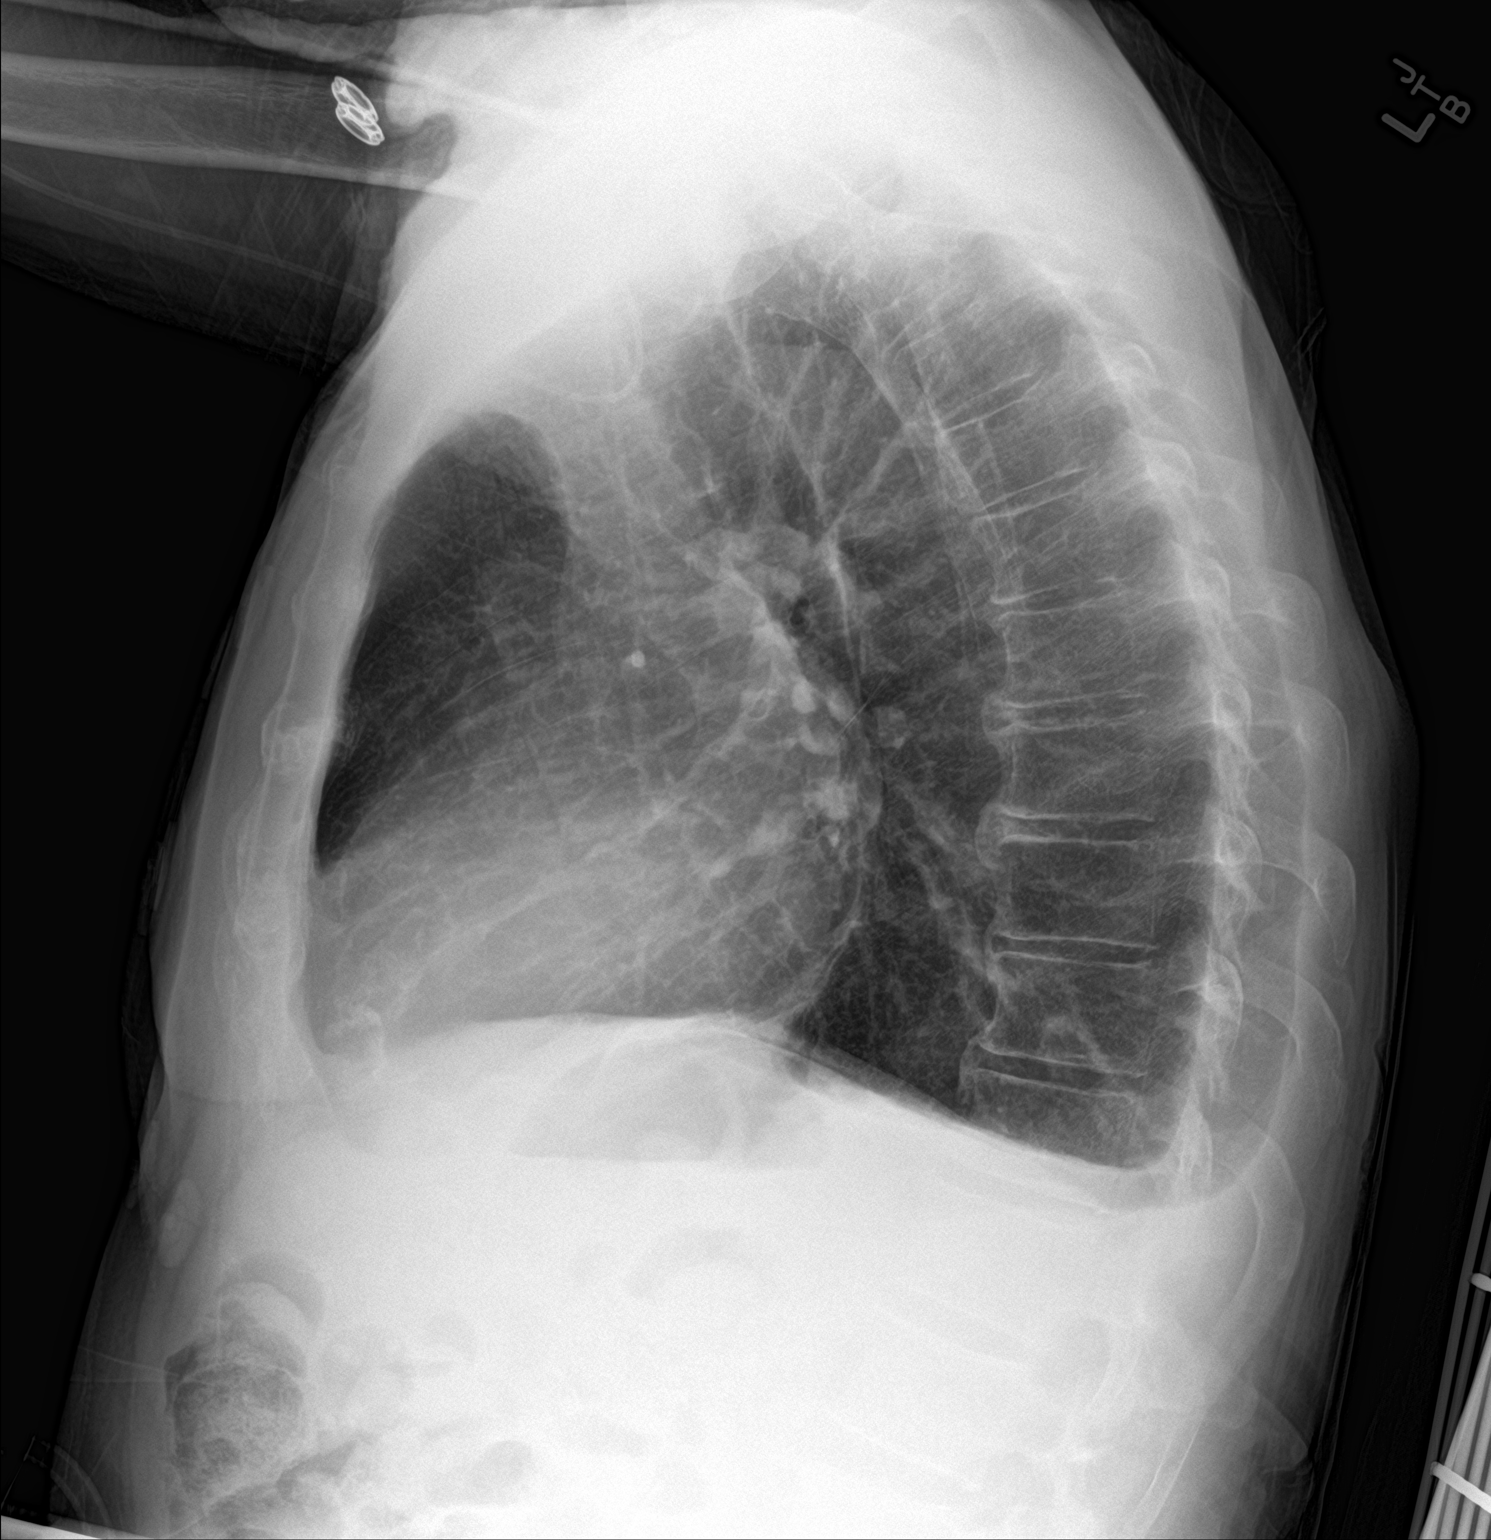

[chest ap]
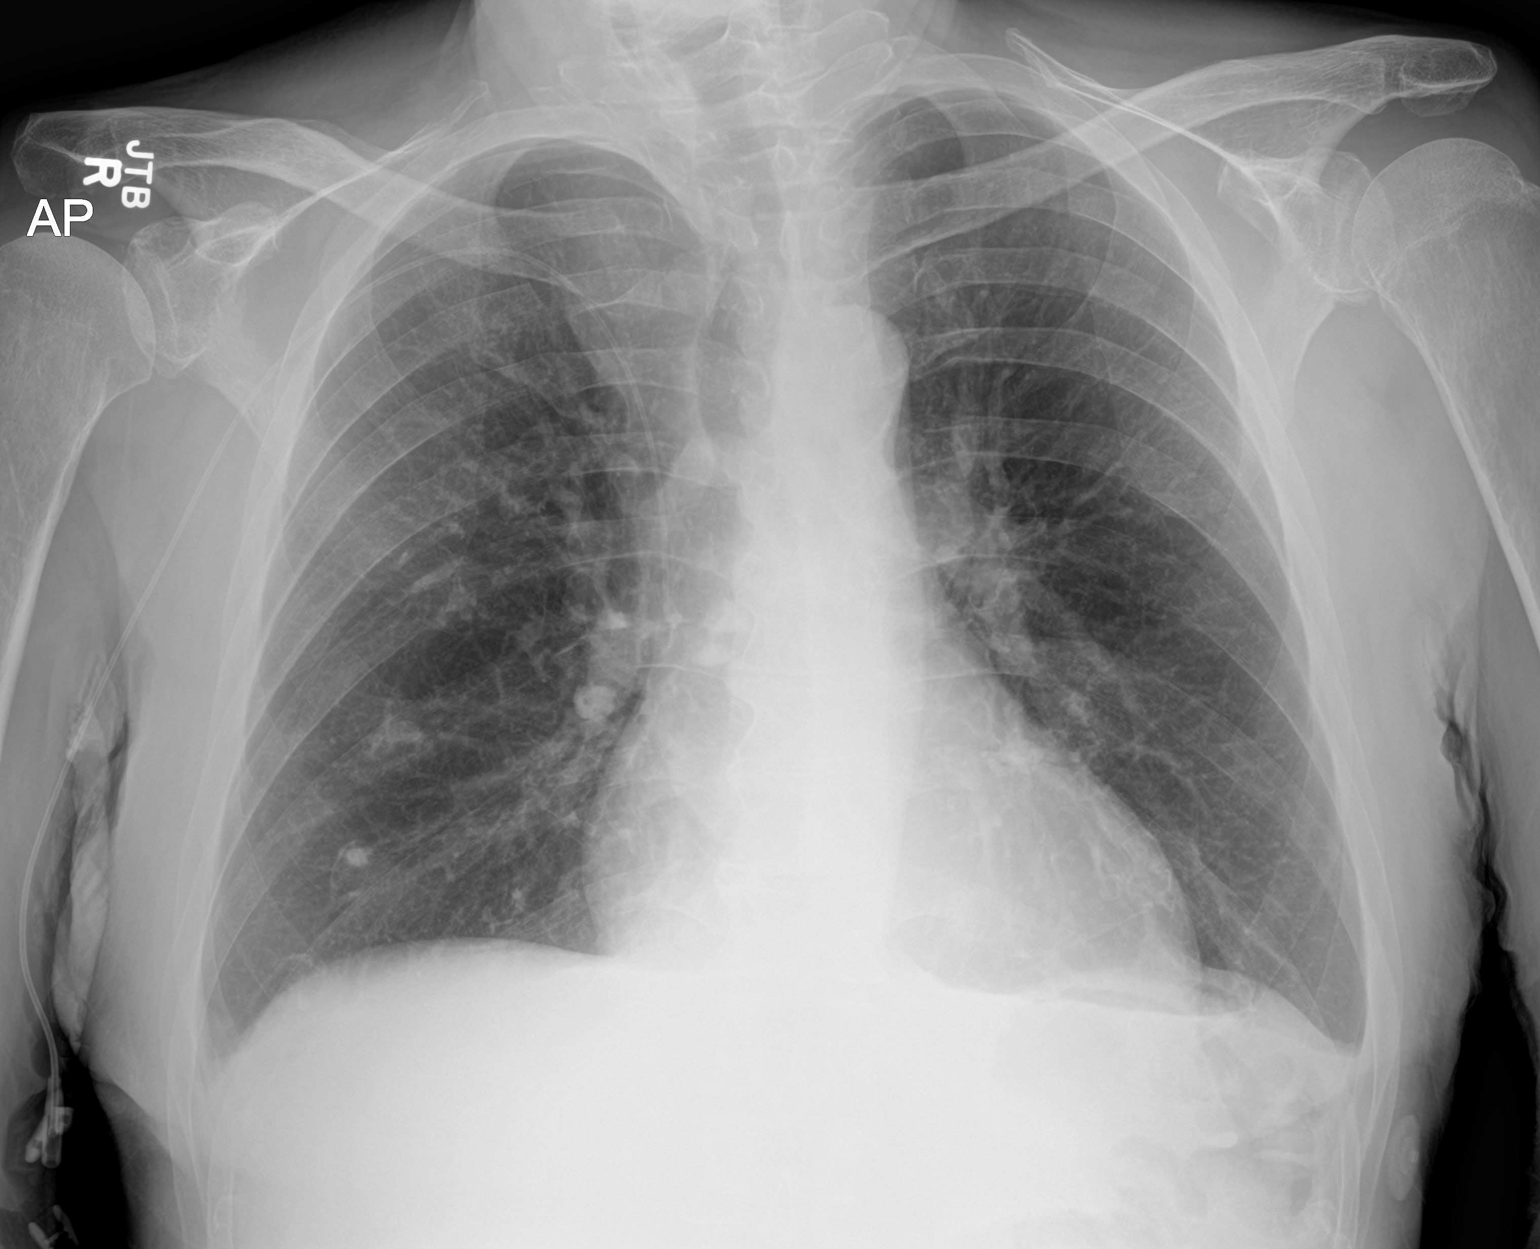

[2 of 2 positions shown; findings below may reference images not displayed]

FINDINGS: Unchanged right upper extremity PICC line. The heart size and
mediastinal contours are within normal limits. Normal pulmonary
vascularity. Trace bilateral pleural effusions. No consolidation or
pneumothorax. No acute osseous abnormality.
IMPRESSION: 1. Trace bilateral pleural effusions.

## 2019-07-22 MED ORDER — ONDANSETRON HCL 4 MG/2ML IJ SOLN: 4.0000 mg | Freq: Four times a day (QID) | INTRAMUSCULAR | Status: DC | PRN

## 2019-07-22 MED ORDER — SODIUM CHLORIDE 0.9% FLUSH
3.0000 mL | Freq: Once | INTRAVENOUS | Status: AC
  Administered 2019-07-22: 3 mL via INTRAVENOUS

## 2019-07-22 MED ORDER — CEFAZOLIN SODIUM-DEXTROSE 2-4 GM/100ML-% IV SOLN
2.0000 g | Freq: Three times a day (TID) | INTRAVENOUS | Status: DC
  Administered 2019-07-23 – 2019-07-28 (×17): 2 g via INTRAVENOUS
  Filled 2019-07-22 (×18): qty 100

## 2019-07-22 MED ORDER — OXYCODONE-ACETAMINOPHEN 7.5-325 MG PO TABS
1.0000 | ORAL_TABLET | ORAL | Status: DC | PRN
  Administered 2019-07-22 – 2019-07-27 (×9): 1 via ORAL
  Filled 2019-07-22 (×10): qty 1

## 2019-07-22 MED ORDER — APIXABAN 5 MG PO TABS
5.0000 mg | ORAL_TABLET | Freq: Two times a day (BID) | ORAL | Status: DC
  Administered 2019-07-22 – 2019-07-28 (×12): 5 mg via ORAL
  Filled 2019-07-22 (×12): qty 1

## 2019-07-22 MED ORDER — ACETAMINOPHEN 325 MG PO TABS: 325.0000 mg | ORAL_TABLET | Freq: Four times a day (QID) | ORAL | Status: DC | PRN

## 2019-07-22 MED ORDER — SODIUM CHLORIDE 0.9 % IV SOLN
250.0000 mL | INTRAVENOUS | Status: DC | PRN
  Administered 2019-07-26 – 2019-07-27 (×2): 250 mL via INTRAVENOUS

## 2019-07-22 MED ORDER — ACETAMINOPHEN 325 MG PO TABS
650.0000 mg | ORAL_TABLET | ORAL | Status: DC | PRN
  Administered 2019-07-23: 650 mg via ORAL
  Filled 2019-07-22: qty 2

## 2019-07-22 MED ORDER — FUROSEMIDE 10 MG/ML IJ SOLN
40.0000 mg | Freq: Two times a day (BID) | INTRAMUSCULAR | Status: DC
  Administered 2019-07-23 – 2019-07-25 (×5): 40 mg via INTRAVENOUS
  Filled 2019-07-22 (×6): qty 4

## 2019-07-22 MED ORDER — LORATADINE 10 MG PO TABS: 10.0000 mg | ORAL_TABLET | Freq: Every day | ORAL | Status: DC | PRN

## 2019-07-22 MED ORDER — ALPRAZOLAM 0.5 MG PO TABS
1.0000 mg | ORAL_TABLET | Freq: Every day | ORAL | Status: DC
  Administered 2019-07-22 – 2019-07-27 (×6): 1 mg via ORAL
  Filled 2019-07-22 (×6): qty 2

## 2019-07-22 MED ORDER — FUROSEMIDE 10 MG/ML IJ SOLN
40.0000 mg | Freq: Once | INTRAMUSCULAR | Status: AC
  Administered 2019-07-22: 40 mg via INTRAVENOUS
  Filled 2019-07-22: qty 4

## 2019-07-22 MED ORDER — FLUTICASONE PROPIONATE 50 MCG/ACT NA SUSP
1.0000 | Freq: Every day | NASAL | Status: DC | PRN
  Filled 2019-07-22: qty 16

## 2019-07-22 MED ORDER — SODIUM CHLORIDE 0.9% FLUSH
3.0000 mL | Freq: Two times a day (BID) | INTRAVENOUS | Status: DC
  Administered 2019-07-22 – 2019-07-28 (×10): 3 mL via INTRAVENOUS

## 2019-07-22 MED ORDER — SODIUM CHLORIDE 0.9% FLUSH: 3.0000 mL | INTRAVENOUS | Status: DC | PRN

## 2019-07-22 MED ORDER — METOPROLOL TARTRATE 25 MG PO TABS: 12.5000 mg | ORAL_TABLET | Freq: Two times a day (BID) | ORAL | Status: DC

## 2019-07-22 MED ORDER — PAROXETINE HCL 30 MG PO TABS
30.0000 mg | ORAL_TABLET | Freq: Every evening | ORAL | Status: DC
  Administered 2019-07-23 – 2019-07-27 (×6): 30 mg via ORAL
  Filled 2019-07-22 (×7): qty 1

## 2019-07-22 MED ORDER — NAFCILLIN SODIUM 1 G IJ SOLR: 12.0000 g | Freq: Every day | INTRAVENOUS | Status: DC

## 2019-07-22 NOTE — ED Notes (Signed)
Lunch Tray Ordered @ 1750.

## 2019-07-22 NOTE — ED Triage Notes (Signed)
Pt. Stated, I had surgery on my back for a herniated disc on May 12 and developed a staph infection. I have a drain in my back.  I started having fluid build up in my chest  And my legs and waist down is fluid retention since surgery.

## 2019-07-22 NOTE — Discharge Planning (Signed)
Pt currently active with [facility] for Home IV Infusion services and [facility] for Home Health services.  Resumption of care requested. Pam and Elnita Maxwell of notified of pt admission.  No DME needs identified at this time.   Nell Schrack J. Lucretia Roers, RN, BSN, [place] [phone]

## 2019-07-22 NOTE — H&P (Addendum)
History and Physical    Neil Cordova YCX:[id number] DOB: [birth year] DOA: 07/22/2019  I have briefly reviewed the patient's prior medical records in [place]  PCP: Patient, No Pcp Per  Patient coming from: home  Chief Complaint: leg swelling   HPI: Neil Cordova is a 79 y.o. male with medical history significant of L4-L5 microdiscectomy on May 10 by Dr. Kathyrn Sheriff otherwise relatively healthy.  Following the surgery about 2 weeks afterwards he was hospitalized here with acute metabolic encephalopathy with delirium, he was found to have MSSA bacteremia in the setting of surgical wound infection/ventral abscess.  During that admission underwent repeat surgery cultures from the abscess grew MSSA as well.  He was sent home with intravenous antibiotics (nafcillin) via a PICC line for 6 weeks with end date 7/11.  During that hospital stay he was also diagnosed with new onset a flutter, he was rate controlled on metoprolol.  He was placed on Eliquis by cardiology.  2D echo was done showed an EF of 50-55%, no WMA however subsequent TEE to look for endocarditis actually showed an EF of 30-35% with moderate to severe LV dysfunction.  Patient tells me that since his discharge on 6/3 he has been having progressive lower extremity swelling from the waist down and has gained about 30 pounds.  His legs have become painful and red.  He denies any shortness of breath.  He denies any chest pain, denies any palpitations.  He also tells me that when he was discharged did not have any antibiotics at home for at least 36 hours and he is quite upset about that.  Currently he has no fever or chills, no abdominal pain, no nausea or vomiting.  He received his Covid vaccination.  ED Course: In the emergency room he is afebrile, blood pressure is in the 140s-170s, he is satting 100% on room air.  His blood work reveals a creatinine of 1.2, hemoglobin of 8.6, glucose 201 but otherwise unremarkable.  Chest x-ray shows trace  bilateral pleural effusions.  BNP was elevated.  We were asked to admit for heart failure.  Review of Systems: All systems reviewed, and apart from HPI, all negative  Past Medical History:  Diagnosis Date  . Hypertension     Past Surgical History:  Procedure Laterality Date  . BACK SURGERY    . IR US GUIDE BX ASP/DRAIN  07/05/2019  . LUMBAR WOUND DEBRIDEMENT N/A 07/01/2019   Procedure: LUMBAR WOUND EXPLORATION;  Surgeon: Consuella Lose, MD;  Location: [facility];  Service: Neurosurgery;  Laterality: N/A;  posterior  . TEE WITHOUT CARDIOVERSION N/A 07/04/2019   Procedure: TRANSESOPHAGEAL ECHOCARDIOGRAM (TEE);  Surgeon: Acie Fredrickson Wonda Cheng, MD;  Location: [facility] ENDOSCOPY;  Service: Cardiovascular;  Laterality: N/A;     reports that he has quit smoking. His smokeless tobacco use includes snuff. He reports previous alcohol use. No history on file for drug use.  No Known Allergies  Family History  Problem Relation Age of Onset  . Emphysema Father   . CAD Neg Hx   . Heart failure Neg Hx     Prior to Admission medications   Medication Sig Start Date End Date Taking? Authorizing Provider  acetaminophen (TYLENOL) 325 MG tablet Take 325 mg by mouth every 6 (six) hours as needed for moderate pain.    [provider]  ALPRAZolam Duanne Moron) 1 MG tablet Take 1 mg by mouth at bedtime.    [provider]  apixaban (ELIQUIS) 5 MG TABS tablet Take 1 tablet (  5 mg total) by mouth 2 (two) times daily. 07/10/19   Costella, Vista Mink, PA-C  fluticasone (FLONASE) 50 MCG/ACT nasal spray Place 1 spray into both nostrils daily as needed for allergies or rhinitis.    [provider]  ibuprofen (ADVIL) 200 MG tablet Take 200 mg by mouth every 6 (six) hours as needed for mild pain or moderate pain.    [provider]  lisinopril (ZESTRIL) 10 MG tablet Take 10 mg by mouth every evening.    [provider]  loratadine (CLARITIN) 10 MG tablet Take 10 mg by mouth daily as needed for  allergies.    [provider]  nafcillin IVPB Inject 12 g into the vein daily. As a continuous infusion via PICC line.Indication:  MSSA bacteremia/discitis/ventral epidural abscess First Dose: Yes Last Day of Therapy:  08/17/2019 Labs - Twice weekly:  CBC/D and BMP, Labs - Every other week:  ESR and CRP Method of administration: Elastomeric (Continuous infusion) Method of administration may be changed at the discretion of home infusion pharmacist based upon assessment of the patient and/or caregiver's ability to self-administer the medication ordered. 07/10/19 08/19/19  Costella, Vista Mink, PA-C  oxyCODONE-acetaminophen (PERCOCET) 7.5-325 MG tablet Take 1 tablet by mouth every 4 (four) hours as needed for severe pain.    [provider]  PARoxetine (PAXIL) 30 MG tablet Take 30 mg by mouth every evening.    [provider]  psyllium (METAMUCIL) 58.6 % packet Take 1 packet by mouth daily as needed (constipation).    [provider]  sildenafil (VIAGRA) 100 MG tablet Take 100 mg by mouth daily as needed for erectile dysfunction.    [provider]  Turmeric 500 MG TABS Take 500 mg by mouth daily.    [provider]    Physical Exam: Vitals:   07/22/19 1101 07/22/19 1632  BP: 127/64 (!) 145/74  Pulse: 66 69  Resp: 18 18  Temp: 98.4 F (36.9 C)   TempSrc: Oral   SpO2: 100% 100%  Weight: 76.7 kg   Height: _0  (1.778 m)     Constitutional: NAD, calm, comfortable Eyes: PERRL, lids and conjunctivae normal ENMT: Mucous membranes are moist.  Neck: normal, supple Respiratory: clear to auscultation bilaterally, no wheezing, faint bibasilar crackles.  Normal respiratory effort Cardiovascular: Regular rate and rhythm, no murmurs / rubs / gallops.  2+ pitting lower extremity edema Abdomen: no tenderness, no masses palpated. Bowel sounds positive.  Musculoskeletal: no clubbing / cyanosis. Normal muscle tone.  Skin: no rashes, lesions, ulcers.  No induration Neurologic: CN 2-12 grossly intact. Strength 5/5 in all 4.  Psychiatric: Normal judgment and insight. Alert and oriented x 3. Normal mood.   Labs on Admission: I have personally reviewed following labs and imaging studies  CBC: Recent Labs  Lab 07/22/19 1122  WBC 6.4  NEUTROABS 4.7  HGB 8.6*  HCT 28.1*  MCV 97.2  PLT 765   Basic Metabolic Panel: Recent Labs  Lab 07/22/19 1122  NA 136  K 4.2  CL 102  CO2 24  GLUCOSE 201*  BUN 16  CREATININE 1.26*  CALCIUM 8.5*   Liver Function Tests: Recent Labs  Lab 07/22/19 1122  AST 13*  ALT <5  ALKPHOS 76  BILITOT 0.8  PROT 5.9*  ALBUMIN 2.1*   Coagulation Profile: No results for input(s): INR, PROTIME in the last 168 hours. BNP (last 3 results) No results for input(s): PROBNP in the last 8760 hours. CBG: No results for input(s):  GLUCAP in the last 168 hours. Thyroid Function Tests: No results for input(s): TSH, T4TOTAL, FREET4, T3FREE, THYROIDAB in the last 72 hours. Urine analysis:    Component Value Date/Time   COLORURINE YELLOW 06/30/2019 1055   APPEARANCEUR HAZY (A) 06/30/2019 1055   LABSPEC 1.018 06/30/2019 1055   PHURINE 5.0 06/30/2019 1055   GLUCOSEU NEGATIVE 06/30/2019 1055   HGBUR LARGE (A) 06/30/2019 1055   BILIRUBINUR NEGATIVE 06/30/2019 1055   KETONESUR NEGATIVE 06/30/2019 1055   PROTEINUR 100 (A) 06/30/2019 1055   NITRITE NEGATIVE 06/30/2019 [place] 06/30/2019 1055     Radiological Exams on Admission: DG Chest 2 View  Result Date: 07/22/2019 CLINICAL DATA:  Lower extremity swelling. EXAM: CHEST - 2 VIEW COMPARISON:  Chest x-ray dated July 08, 2019. FINDINGS: Unchanged right upper extremity PICC line. The heart size and mediastinal contours are within normal limits. Normal pulmonary vascularity. Trace bilateral pleural effusions. No consolidation or pneumothorax. No acute osseous abnormality. IMPRESSION: 1. Trace bilateral pleural effusions. Electronically Signed    By: Titus Dubin M.D.   On: 07/22/2019 11:46    EKG: Independently reviewed.  A flutter with 41 conduction  Assessment/Plan  Principal Problem Acute on chronic systolic CHF-patient was not on any diuretics at home.  Medication review does not show him being on any beta-blockers although he has been on it while hospitalized last time.  I will start him on Lasix 40 mg twice daily, he is Lasix nave, admitted on heart failure pathway, daily weights, strict I's and O's, telemetry monitoring.  I will repeat a 2D echo.  Cardiology has been consulted via staff messaging.  Resume ACE inhibitor in the morning if renal function is stable after tonight's Lasix.  He has been off his metoprolol and he currently is rate controlled with rates in the 60s.  Active Problems A flutter, rate controlled-EKG in the emergency room shows a flutter with 4-1 conduction.  Hold beta-blockers as he was not getting these at home and currently his rate into the 60s, resume Eliquis, monitor on telemetry.  Recent MSSA bacteremia/MSSA abscess/postop complication-he appears to be on home nafcillin however pharmacy tells me he was actually on Ancef, resume, he has a PICC line in place.  He is afebrile now.  He has a drain in place placed by IR, he was supposed to have repeat MRI on 6/16 to potentially get the drain out.  I have consulted interventional radiology, appreciate input  Anemia of chronic disease-no bleeding, monitor hemoglobin  Hyperglycemia-glucose on the BMP is 201.  He does not have a history of diabetes.  This is not a fasting value, please monitor a.m. glucose on the BMP, if still elevated placed on sliding scale   DVT prophylaxis: Eliquis  Code Status: Full code  Family Communication: no family at bedside  Disposition Plan: home when ready  Bed Type: cardiac telemetry  Consults called: cardiology via staff messaging  Obs/Inp: inpatient   At the time of admission, it appears that the appropriate admission  status for this patient is INPATIENT as it is expected that patient will require hospital care > 2 midnights. This is judged to be reasonable and necessary in order to provide the required intensity of service to ensure the patient's safety given: presenting symptoms, initial radiographic and laboratory data and in the context of their chronic comorbidities. Together, these circumstances are felt to place patient at high at high risk for further clinical deterioration threatening life, limb, or organ.  Pansy Ostrovsky,  MD, PhD [facility]  Contact via [email or web address]  07/22/2019, 5:36 PM

## 2019-07-22 NOTE — ED Notes (Signed)
Correction:Dinner Tray Ordered @ 1750.

## 2019-07-22 NOTE — ED Notes (Signed)
Report given to [facility] 

## 2019-07-22 NOTE — ED Provider Notes (Signed)
[facility] EMERGENCY DEPARTMENT Provider Note   CSN: [id number] Arrival date & time: 07/22/19  1054     History Chief Complaint  Patient presents with   Blood Infection   Chest Pain    Trinidad Petron is a 79 y.o. male.  The history is provided by the patient, the spouse and medical records. No language interpreter was used.  Chest Pain  Maejor Erven is a 79 y.o. male who presents to the Emergency Department complaining of chest pain.  He had lumbar laminotomy and micro discectomy on May 12 that was complicated by epidural abscess. On May 25 he had a lumbar wound exploration, debridement and washout by Dr. Kathyrn Sheriff. He had sepsis at that time and a drain was placed. He was ultimately discharged home on PICC line antibiotics. He presents the emergency department today from home due to experiencing significant weight gain over the last two weeks. He believes he is gained up to 30 pounds. He has swelling from his feet to his naval. He feels like his right lower extremity began to turn red about three days ago. He denies any shortness of breath, fevers, chest pain, cough. He saw the neurosurgery yesterday and was started on Lasix. He feels like he is urinating well but he is not improving in his lower extremity edema. He does have a follow-up appointment cardiology on June 25. The home health nurse today felt that he had fluid on his lungs and recommended evaluation the emergency department.   Past Medical History:  Diagnosis Date   Hypertension     Patient Active Problem List   Diagnosis Date Noted   Acute systolic CHF (congestive heart failure) ([place]) 07/22/2019   Abscess    SIRS (systemic inflammatory response syndrome) ([facility])    Epidural abscess    Acute combined systolic and diastolic congestive heart failure ([place])    Bacteremia    Acute metabolic encephalopathy [id number]/49/6759   Delirium 07/03/2019   Unspecified atrial fibrillation ([place]) 07/03/2019    Atrial flutter ([place]) 07/03/2019   Pressure injury of skin 07/01/2019   Wound infection after surgery 06/30/2019   Hyponatremia 06/30/2019    Past Surgical History:  Procedure Laterality Date   BACK SURGERY     IR US GUIDE BX ASP/DRAIN  07/05/2019   LUMBAR WOUND DEBRIDEMENT N/A 07/01/2019   Procedure: LUMBAR WOUND EXPLORATION;  Surgeon: Consuella Lose, MD;  Location: [place];  Service: Neurosurgery;  Laterality: N/A;  posterior   TEE WITHOUT CARDIOVERSION N/A 07/04/2019   Procedure: TRANSESOPHAGEAL ECHOCARDIOGRAM (TEE);  Surgeon: Acie Fredrickson Wonda Cheng, MD;  Location: [facility] ENDOSCOPY;  Service: Cardiovascular;  Laterality: N/A;       Family History  Problem Relation Age of Onset   Emphysema Father    CAD Neg Hx    Heart failure Neg Hx     Social History   Tobacco Use   Smoking status: Former Smoker   Smokeless tobacco: Current User    Types: Snuff  Vaping Use   Vaping Use: Never used  Substance Use Topics   Alcohol use: Not Currently   Drug use: Not on file    Home Medications Prior to Admission medications   Medication Sig Start Date End Date Taking? Authorizing Provider  acetaminophen (TYLENOL) 325 MG tablet Take 650 mg by mouth every 6 (six) hours as needed for mild pain, moderate pain or headache.    Yes [provider]  ALPRAZolam Duanne Moron) 1 MG tablet Take 1 mg by mouth at bedtime.  [provider]  apixaban (ELIQUIS) 5 MG TABS tablet Take 1 tablet (5 mg total) by mouth 2 (two) times daily. 07/10/19   Costella, Vista Mink, PA-C  fluticasone (FLONASE) 50 MCG/ACT nasal spray Place 1 spray into both nostrils daily as needed for allergies or rhinitis.    [provider]  ibuprofen (ADVIL) 200 MG tablet Take 200 mg by mouth every 6 (six) hours as needed for mild pain or moderate pain.    [provider]  lisinopril (ZESTRIL) 10 MG tablet Take 10 mg by mouth every evening.    [provider]  loratadine (CLARITIN) 10 MG  tablet Take 10 mg by mouth daily as needed for allergies.    [provider]  nafcillin IVPB Inject 12 g into the vein daily. As a continuous infusion via PICC line.Indication:  MSSA bacteremia/discitis/ventral epidural abscess First Dose: Yes Last Day of Therapy:  08/17/2019 Labs - Twice weekly:  CBC/D and BMP, Labs - Every other week:  ESR and CRP Method of administration: Elastomeric (Continuous infusion) Method of administration may be changed at the discretion of home infusion pharmacist based upon assessment of the patient and/or caregiver's ability to self-administer the medication ordered. 07/10/19 08/19/19  Costella, Vista Mink, PA-C  oxyCODONE-acetaminophen (PERCOCET) 7.5-325 MG tablet Take 1 tablet by mouth every 4 (four) hours as needed for severe pain.    [provider]  PARoxetine (PAXIL) 30 MG tablet Take 30 mg by mouth every evening.    [provider]  psyllium (METAMUCIL) 58.6 % packet Take 1 packet by mouth daily as needed (constipation).    [provider]  sildenafil (VIAGRA) 100 MG tablet Take 100 mg by mouth daily as needed for erectile dysfunction.    [provider]  Turmeric 500 MG TABS Take 500 mg by mouth daily.    [provider]    Allergies    Patient has no known allergies.  Review of Systems   Review of Systems  Cardiovascular: Positive for chest pain.  All other systems reviewed and are negative.   Physical Exam Updated Vital Signs BP (!) 169/64 (BP Location: Right Arm)    Pulse 69    Temp 99 F (37.2 C) (Oral)    Resp 19    Ht _0  (1.778 m)    Wt 76.7 kg    SpO2 100%    BMI 24.25 kg/m   Physical Exam Vitals and nursing note reviewed.  Constitutional:      Appearance: He is well-developed.  HENT:     Head: Normocephalic and atraumatic.  Cardiovascular:     Rate and Rhythm: Normal rate and regular rhythm.     Heart sounds: No murmur heard.   Pulmonary:     Effort: Pulmonary effort is normal.  No respiratory distress.     Breath sounds: Normal breath sounds.  Abdominal:     Palpations: Abdomen is soft.     Tenderness: There is no abdominal tenderness. There is no guarding or rebound.  Musculoskeletal:     Comments: 3-4+ pitting edema to BLE.  Surgical drain in left lower back.    Skin:    General: Skin is warm and dry.  Neurological:     Mental Status: He is alert and oriented to person, place, and time.  Psychiatric:        Behavior: Behavior normal.     ED Results / Procedures / Treatments   Labs (all labs ordered are listed, but only abnormal  results are displayed) Labs Reviewed  COMPREHENSIVE METABOLIC PANEL - Abnormal; Notable for the following components:      Result Value   Glucose, Bld 201 (*)    Creatinine, Ser 1.26 (*)    Calcium 8.5 (*)    Total Protein 5.9 (*)    Albumin 2.1 (*)    AST 13 (*)    GFR calc non Af Amer 54 (*)    All other components within normal limits  CBC WITH DIFFERENTIAL/PLATELET - Abnormal; Notable for the following components:   RBC 2.89 (*)    Hemoglobin 8.6 (*)    HCT 28.1 (*)    All other components within normal limits  URINALYSIS, ROUTINE W REFLEX MICROSCOPIC - Abnormal; Notable for the following components:   Hgb urine dipstick SMALL (*)    All other components within normal limits  BRAIN NATRIURETIC PEPTIDE - Abnormal; Notable for the following components:   B Natriuretic Peptide 559.2 (*)    All other components within normal limits  SARS CORONAVIRUS 2 BY RT PCR (HOSPITAL ORDER, [place] [facility])  LACTIC ACID, PLASMA  LACTIC ACID, PLASMA  BASIC METABOLIC PANEL    EKG None  Radiology DG Chest 2 View  Result Date: 07/22/2019 CLINICAL DATA:  Lower extremity swelling. EXAM: CHEST - 2 VIEW COMPARISON:  Chest x-ray dated July 08, 2019. FINDINGS: Unchanged right upper extremity PICC line. The heart size and mediastinal contours are within normal limits. Normal pulmonary vascularity. Trace bilateral  pleural effusions. No consolidation or pneumothorax. No acute osseous abnormality. IMPRESSION: 1. Trace bilateral pleural effusions. Electronically Signed   By: Titus Dubin M.D.   On: 07/22/2019 11:46    Procedures Procedures (including critical care time)  Medications Ordered in ED Medications  acetaminophen (TYLENOL) tablet 325 mg (has no administration in time range)  oxyCODONE-acetaminophen (PERCOCET) 7.5-325 MG per tablet 1 tablet (has no administration in time range)  ALPRAZolam (XANAX) tablet 1 mg (has no administration in time range)  PARoxetine (PAXIL) tablet 30 mg (has no administration in time range)  apixaban (ELIQUIS) tablet 5 mg (has no administration in time range)  fluticasone (FLONASE) 50 MCG/ACT nasal spray 1 spray (has no administration in time range)  loratadine (CLARITIN) tablet 10 mg (has no administration in time range)  sodium chloride flush (NS) 0.9 % injection 3 mL (has no administration in time range)  sodium chloride flush (NS) 0.9 % injection 3 mL (has no administration in time range)  0.9 %  sodium chloride infusion (has no administration in time range)  acetaminophen (TYLENOL) tablet 650 mg (has no administration in time range)  ondansetron (ZOFRAN) injection 4 mg (has no administration in time range)  furosemide (LASIX) injection 40 mg (has no administration in time range)  ceFAZolin (ANCEF) IVPB 2g/100 mL premix (has no administration in time range)  sodium chloride flush (NS) 0.9 % injection 3 mL (3 mLs Intravenous Given 07/22/19 1822)  furosemide (LASIX) injection 40 mg (40 mg Intravenous Given 07/22/19 1824)    ED Course  I have reviewed the triage vital signs and the nursing notes.  Pertinent labs & imaging results that were available during my care of the patient were reviewed by me and considered in my medical decision making (see chart for details).    MDM Rules/Calculators/A&P                         Patient presents the emergency  department for evaluation of progressive  lower extremity edema following recent lumbar surgery with complication of infection. He is markedly edematous on examination. Will treat with IV Lasix for diuresis. Given degree of edema recommend admission for further diuresis. Patient updated findings of studies recommendation for admission and he is in agreement treatment plan. Hospitalist consulted for admission.  Final Clinical Impression(s) / ED Diagnoses Final diagnoses:  Acute congestive heart failure, unspecified heart failure type [facility])    Rx / [place] Orders ED Discharge Orders    None       Quintella Reichert, MD 07/22/19 2137

## 2019-07-23 ENCOUNTER — Inpatient Hospital Stay: Admission: RE | Admit: 2019-07-23 | Payer: Medicare Other | Source: Ambulatory Visit

## 2019-07-23 ENCOUNTER — Inpatient Hospital Stay (HOSPITAL_COMMUNITY): Payer: Medicare Other

## 2019-07-23 ENCOUNTER — Other Ambulatory Visit: Payer: Medicare Other

## 2019-07-23 DIAGNOSIS — I4892 Unspecified atrial flutter: Principal | ICD-10-CM

## 2019-07-23 DIAGNOSIS — I5043 Acute on chronic combined systolic (congestive) and diastolic (congestive) heart failure: Secondary | ICD-10-CM

## 2019-07-23 DIAGNOSIS — R600 Localized edema: Secondary | ICD-10-CM

## 2019-07-23 DIAGNOSIS — I5021 Acute systolic (congestive) heart failure: Secondary | ICD-10-CM

## 2019-07-23 LAB — BASIC METABOLIC PANEL
Anion gap: 11 (ref 5–15)
BUN: 9 mg/dL (ref 8–23)
CO2: 29 mmol/L (ref 22–32)
Calcium: 8.9 mg/dL (ref 8.9–10.3)
Chloride: 98 mmol/L (ref 98–111)
Creatinine, Ser: 1.12 mg/dL (ref 0.61–1.24)
GFR calc Af Amer: >60 mL/min (ref >60)
GFR calc non Af Amer: >60 mL/min (ref >60)
Glucose, Bld: 100 mg/dL — ABNORMAL HIGH (ref 70–99)
Potassium: 3.5 mmol/L (ref 3.5–5.1)
Sodium: 138 mmol/L (ref 135–145)

## 2019-07-23 LAB — ECHOCARDIOGRAM COMPLETE
Height: 70 in
Weight: 2572.8 oz

## 2019-07-23 IMAGING — CT CT ABD-PELV W/ CM
2 of 5 series · 13 of 46 positions shown, 15 images · IV contrast (Omni 300)
Comparison: Ultrasound-guided subcutaneous drainage catheter
placement-[DATE]

Lumbar spine MRI-[DATE]

CLINICAL DATA: History of abscess within the subcutaneous tissues
about the caudal aspect of the left flank following L4-L5
laminectomy, post ultrasound-guided placement of percutaneous
catheter into the subcutaneous flank abscess on [DATE].

EXAM:
CT ABDOMEN AND PELVIS WITH CONTRAST
TECHNIQUE: Multidetector CT imaging of the abdomen and pelvis was performed
using the standard protocol following bolus administration of
intravenous contrast.
CONTRAST:  100mL OMNIPAQUE IOHEXOL 300 MG/ML  SOLN

[Series 3: a/p w/ 5mm · axial · 0.82mm/px · z∈[+802,+1207]mm · 10 of 91 slices shown, 12 images]
[im 5/91  soft-tissue]
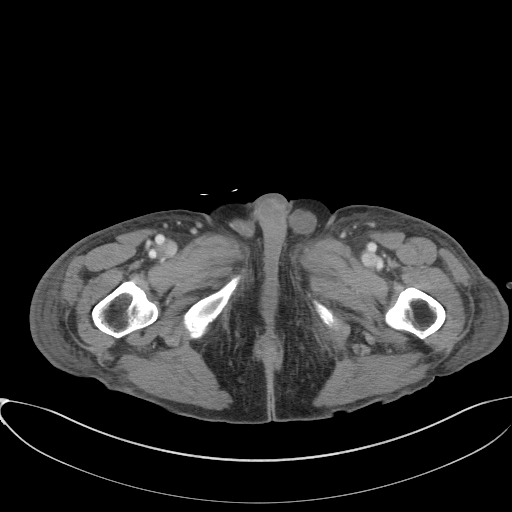
[im 5/91  bone]
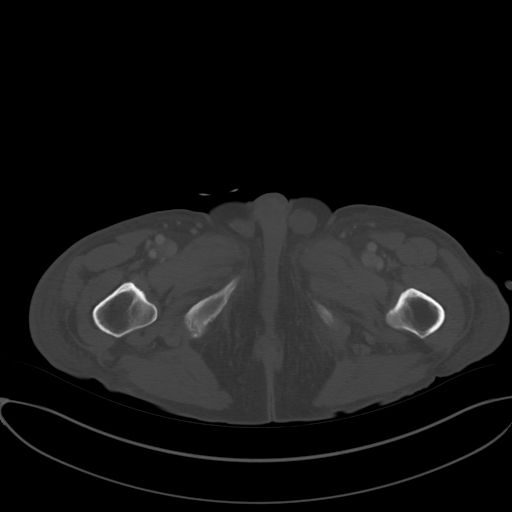
[im 15/91  soft-tissue]
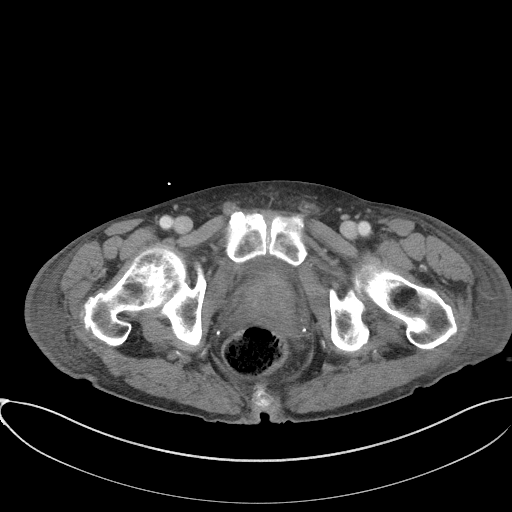
[im 24/91  soft-tissue]
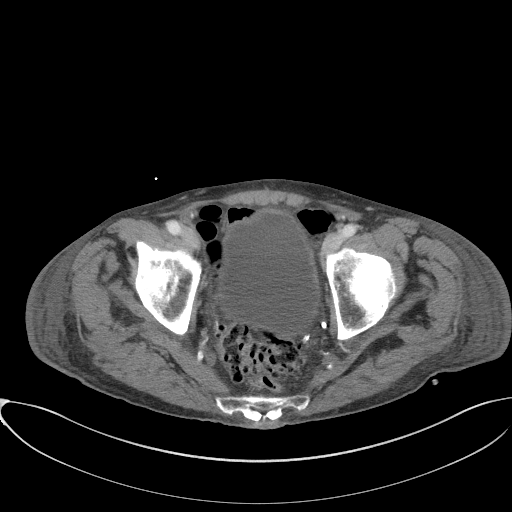
[im 34/91  soft-tissue]
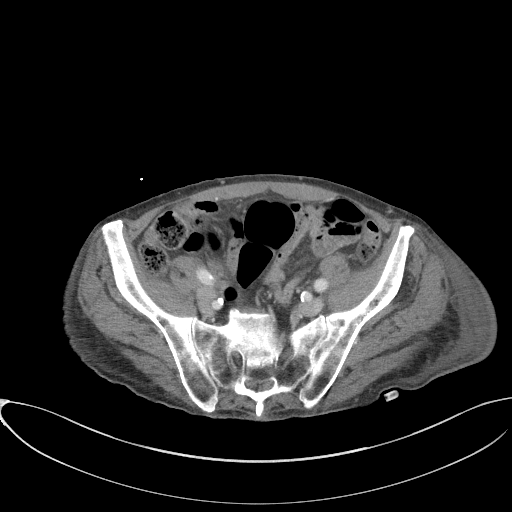
[im 43/91  soft-tissue]
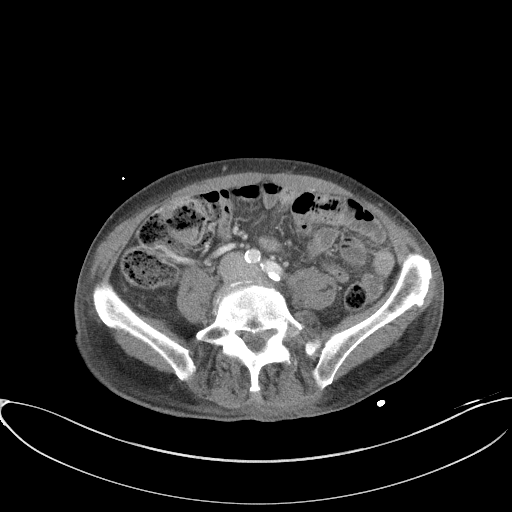
[im 48/91  soft-tissue]
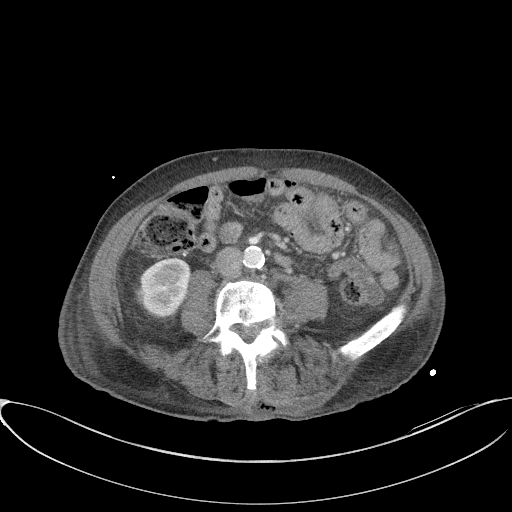
[im 57/91  soft-tissue]
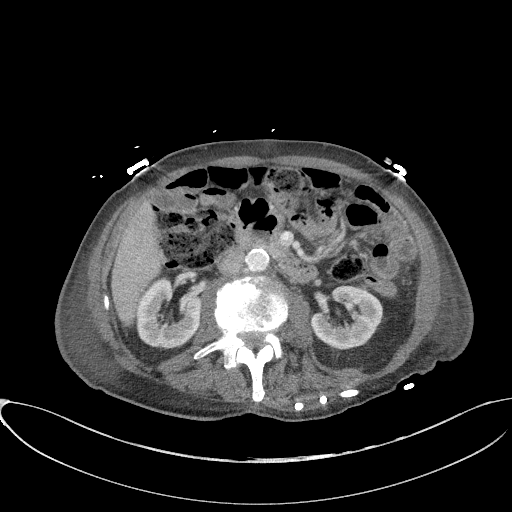
[im 67/91  soft-tissue]
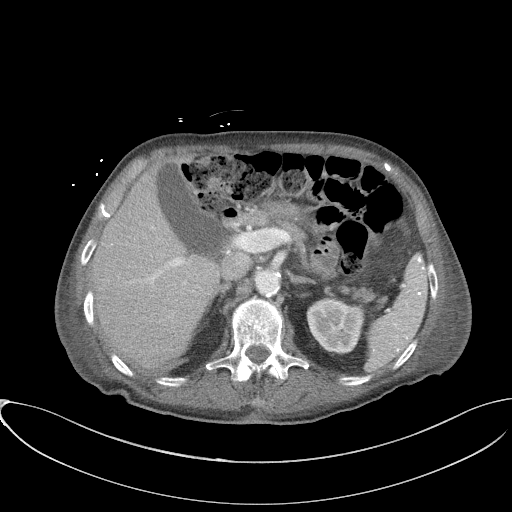
[im 76/91  soft-tissue]
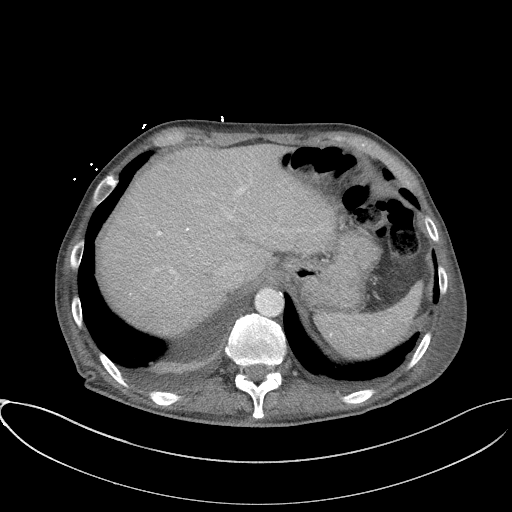
[im 76/91  bone]
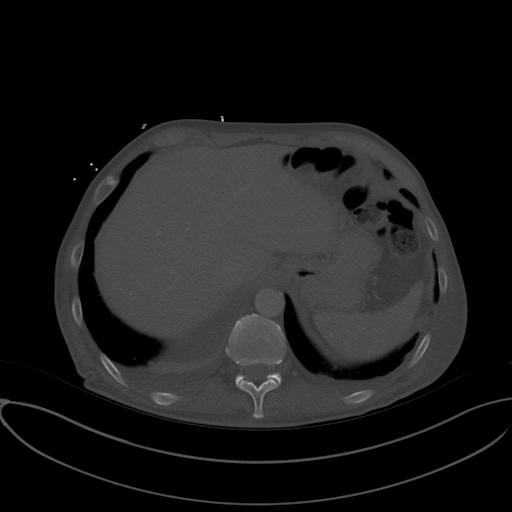
[im 86/91  soft-tissue]
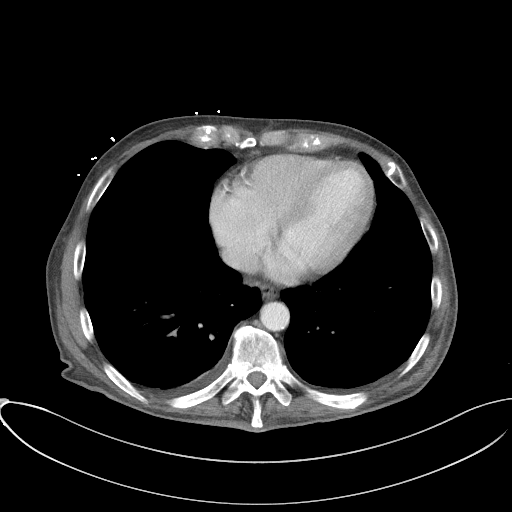

[Series 6: a/p w/ cor · coronal · 0.71mm/px · 3 of 130 slices shown]
[im 44/130  soft-tissue]
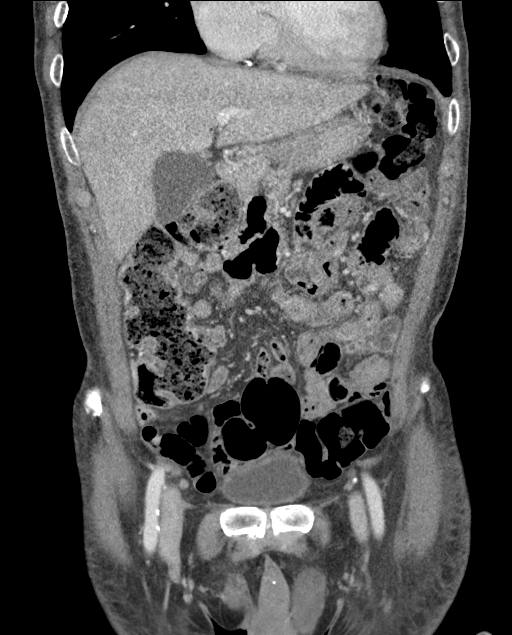
[im 58/130  soft-tissue]
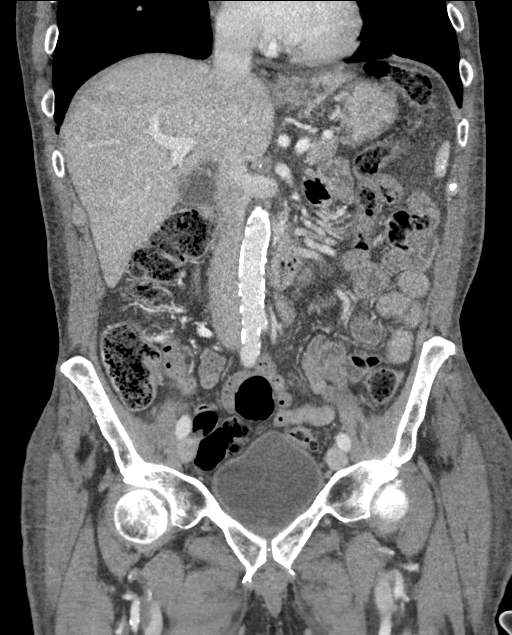
[im 72/130  soft-tissue]
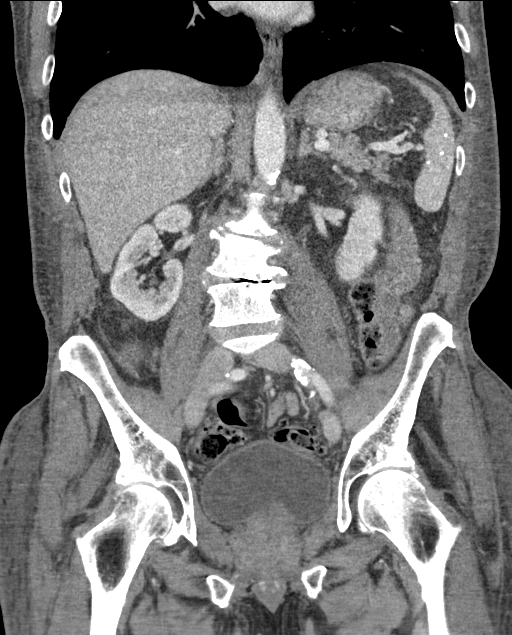

[13 of 46 positions shown; findings below may reference images not displayed]

FINDINGS: Lower chest: Limited visualization of the lower thorax demonstrates
small/trace bilateral effusions with associated bibasilar
atelectasis, right greater than left. Note is made of a punctate
(approximately 0.4 cm granuloma within the right lower lobe (image
8, series 5). Normal heart size. Coronary artery calcifications. No
pericardial effusion.

Hepatobiliary: Normal hepatic contour. There are multiple punctate
subcentimeter hypoattenuating hepatic lesions which are too small to
adequately characterize though favored to represent hepatic cysts.
Note is made of multiple punctate calcified hepatic granulomas.
There is a minimal amount of focal fatty infiltration adjacent to
the fissure for the ligamentum teres. No discrete worrisome hepatic
lesions. Normal appearance of the gallbladder given degree
distention. No radiopaque gallstones. No intra or extrahepatic
biliary ductal dilatation. No ascites.

Pancreas: Normal appearance of the pancreas.

Spleen: Multiple punctate granulomas are seen within otherwise
normal-appearing spleen.

Adrenals/Urinary Tract: Symmetric enhancement and excretion of the
bilateral kidneys. No evidence of nephrolithiasis on this
postcontrast examination. Note is made of an approximately 1.4 cm
hypoattenuating nonenhancing left-sided renal cyst. No discrete
right-sided renal lesions. No urinary obstruction or perinephric
stranding.

There is mild thickening of the left adrenal gland without discrete
nodule. Normal appearance of the right adrenal gland.

Normal appearance of the urinary bladder given degree distention.

Stomach/Bowel: Extensive colonic diverticulosis, primarily involving
the sigmoid colon, without evidence of superimposed acute
diverticulitis. Moderate to large colonic stool burden without
evidence of enteric obstruction. Normal appearance of the terminal
ileum and the retrocecal appendix. No discrete areas of bowel wall
thickening. No pneumoperitoneum, pneumatosis or portal venous gas.

Vascular/Lymphatic: Moderate amount of slightly irregular mixed
noncalcified though predominantly calcified atherosclerotic plaque
within a normal caliber abdominal aorta. The major branch vessels of
the abdominal aorta appear patent on this non CTA examination.

No bulky retroperitoneal, mesenteric, pelvic or inguinal
lymphadenopathy.

Reproductive: Prostatomegaly with mass effect on the undersurface of
the urinary bladder. No free fluid the pelvic cul-de-sac.

Other: Interval reduction/near resolution of subcutaneous fluid
collection about the caudal aspect of the left lower back/flank with
residual serpiginous collection measuring approximately 8.8 x 6.4 x
0.9 cm (sagittal image 118, series 7; axial image 43, series 6).
Diffuse body wall anasarca, most conspicuous about the bilateral
flanks and lateral thighs bilaterally.

Musculoskeletal: Post left-sided L4 laminectomy (axial image 45,
series 3), with suspected residual ventral epidural thickening
extending from L2 through L4, suboptimally evaluated.

Severe DDD of L2-L3 and L3-L4 with disc space height loss, endplate
irregularity and small posteriorly directed disc osteophyte complex
at L3-L4. Additionally, there is endplate lucency about the L2-L3
and L3-L4 intervertebral disc spaces, most conspicuous at L2-L3
(coronal image 96, series 7) which is nonspecific though could be
seen in the setting of discitis/osteomyelitis as was suggested on
lumbar spine MRI performed [DATE]

Mild degenerative change of the bilateral hips, left greater than
right, with joint space loss, subchondral sclerosis and
osteophytosis.
IMPRESSION: 1. Post percutaneous drainage catheter placement with interval
reduction/near resolution of serpiginous fluid collection within the
subcutaneous tissues about the left lower back/flank. No new
definable/drainable fluid collections.
2. Suspected ventral epidural thickening extending from L2-L4,
incompletely evaluated on the present examination though previously
was worrisome for an epidural abscess. Additionally, there is severe
DDD of L2-L3 and L3-L4, however given associated endplate lucency
findings could be attributable to discitis/osteomyelitis (as was
suggested on previous lumbar spine MRI). Further evaluation with
repeat contrast-enhanced lumbar spine MRI as indicated.
3. Small/trace bilateral effusions with mild diffuse body wall
anasarca, nonspecific though could be seen in the setting of
congestive heart failure. Clinical correlation is advised.
4. Extensive colonic diverticulosis, primarily involving the sigmoid
colon, without evidence of superimposed acute diverticulitis.
5. Large colonic stool burden without evidence of enteric
obstruction.
6. Prostatomegaly with mass effect on the undersurface of the
urinary bladder. If not recently performed, further evaluation with
BORGELUS is advised.
7. See BORGELUS of previous granulomatous infection as above.
8.  Aortic Atherosclerosis ([OY]-[OY]).

## 2019-07-23 MED ORDER — METOPROLOL SUCCINATE ER 25 MG PO TB24
12.5000 mg | ORAL_TABLET | Freq: Every day | ORAL | Status: DC
  Administered 2019-07-23 – 2019-07-24 (×2): 12.5 mg via ORAL
  Filled 2019-07-23 (×2): qty 1

## 2019-07-23 MED ORDER — IOHEXOL 300 MG/ML  SOLN
100.0000 mL | Freq: Once | INTRAMUSCULAR | Status: AC | PRN
  Administered 2019-07-23: 100 mL via INTRAVENOUS

## 2019-07-23 MED ORDER — CHLORHEXIDINE GLUCONATE CLOTH 2 % EX PADS
6.0000 | MEDICATED_PAD | Freq: Every day | CUTANEOUS | Status: DC
  Administered 2019-07-23 – 2019-07-28 (×6): 6 via TOPICAL

## 2019-07-23 NOTE — Progress Notes (Signed)
   Postoperative lumbar spine infection  5/29: IR procedure:  FINDINGS: Hypoechoic fluid collection in the left back subcutaneous tissues. Findings correspond with the large fluid collection on recent MRI. 12 French drain was placed within the collection and 22 mL of purulent fluid was removed.  IMPRESSION: Ultrasound-guided placement of a drainage catheter within the left back subcutaneous abscess. Fluid was sent for culture  Pt was to be seen in IR OP Clinic today But now is INpt at [facility]  Request made for evaluation and possible drain removal  Duscussed with Dr watts Rec: CT evaluation today  Pending report  Will review with Dr Grace Isaac IR PA will see pt when plan is determined

## 2019-07-23 NOTE — Progress Notes (Signed)
PROGRESS NOTE  Epic Tribbett  DOB: [birth year]  PCP: Neil Cordova, No Pcp Per [id number]  DOA: 07/22/2019  LOS: 1 day   Chief Complaint  Neil Cordova presents with   Blood Infection   Chest Pain   Brief narrative: Neil Cordova is a 79 y.o. male  who was physically very active and did not have any past medical issues until recent surgery.   5/10, Neil Cordova underwent L4-L5 microdiscectomy on 06/16/2019 by Dr. Conchita Paris 5/24 - 6/3, Neil Cordova was hospitalized with acute metabolic encephalopathy with delirium. He was found to have MSSA bacteremia in the setting of surgical wound infection/ventral abscess.  He underwent repeat surgery cultures from the abscess which grew MSSA as well.  He was sent home with intravenous antibiotics (nafcillin) via a PICC line for 6 weeks with end date 7/11. During that hospital stay he was also diagnosed with new onset a flutter, he was rate controlled on metoprolol.  He was placed on Eliquis by cardiology.  2D echo was done showed an EF of 50-55%, no WMA however subsequent TEE to look for endocarditis actually showed a low EF of 30-35% with moderate to severe LV dysfunction. Apparently at discharge, he was not sent on cardiac medications.  Post discharge, Neil Cordova started having progressive lower extremity swelling from the waist down and gained about 30 pounds. His legs became painful and red.   Also, when he was discharged did not have any antibiotics at home for at least 36 hours and he was quite upset about that.    Neil Cordova was admitted under hospitalist service for CHF exacerbation. Cardiology consultation was obtained.  Subjective: Neil Cordova was seen and examined this morning.  Sitting up in chair.  Not in distress. He states he was running several miles a day prior to the surgery.  He wants to be able to do the same pretty soon.  Assessment/Plan: Acute on chronic systolic CHF -In last hospitalization, he was noted to have EF 30 to 35% with moderate to severe LV  dysfunction. -Bilateral pedal edema started and progressively worsened since last hospitalization.  Not on diuretics at home.  -Cardiology consult appreciated.  Neil Cordova is currently on Toprol 12.5 mg daily and Lasix IV 40 mg daily.  -Continue daily weights, strict I's and O's, telemetry monitoring.  -Monitor blood pressure, renal function and electrolytes. -Repeat 2D echocardiogram today.    A flutter, rate controlled -EKG in the ED showed A flutter with 4-1 conduction.   -Cardiology has started the Neil Cordova on metoprolol low-dose.   -Noted a plan to do a TEE-cardioversion on 6/18.   -Eliquis has been resumed.  Recent MSSA bacteremia/MSSA abscess/postop complication -Follow prolonged course of IV Ancef at home planned for 6 weeks with end date 7/11. -He has a drain in place placed by IR on 5/29. -Today, Neil Cordova is planned for CT evaluation of the drain by IR.  Anemia of chronic disease -Neil Cordova denies any past history of blood loss, EGD or colonoscopy.   -Since surgery, Neil Cordova's hemoglobin has been trending less than 10 mostly.   -Recent iron panel with ferritin level more than 500, but with low iron level and TIBC.  Levels and ferritin was elevated because of inflammation. -Continue to follow hemoglobin.  If starts to drop down, may need GI evaluation.  Otherwise recommend outpatient evaluation.  Hyperglycemia -glucose on the initial BMP is 201.  No history of diabetes.   -Obtain A1c.  Possible cellulitis -Both of his lower extremities are red associated with swelling.  Could be from CHF  itself but also cannot rule out possibility of coexisting cellulitis.  Neil Cordova is already on IV Ancef.  Continue to monitor clinically.  Monitor temperature trend and WBC trend.  Mobility: Encourage ambulation.  PT evaluation Code Status:  Full code  DVT prophylaxis:apixaban (ELIQUIS) tablet 5 mg  Antimicrobials:  IV Ancef Fluid: None Diet: Cardiac diet  Consultants: Cardiology Family  Communication:  Spoke with Neil Cordova's daughter Ms. Megan at bedside.  Status is: Inpatient Remains inpatient appropriate because:IV treatments appropriate due to intensity of illness or inability to take PO and Inpatient level of care appropriate due to severity of illness   Dispo: The Neil Cordova is from: Home              Anticipated d/c is to: Home              Anticipated d/c date is: > 3 days              Neil Cordova currently is not medically stable to d/c.  Antimicrobials: Anti-infectives (From admission, onward)   Start     Dose/Rate Route Frequency Ordered Stop   07/23/19 0200  ceFAZolin (ANCEF) IVPB 2g/100 mL premix     Discontinue     2 g 200 mL/hr over 30 Minutes Intravenous Every 8 hours 07/22/19 1829     07/22/19 1800  nafcillin 12 g in dextrose 5 % 50 mL IVPB  Status:  Discontinued        12 g 100 mL/hr over 30 Minutes Intravenous Daily 07/22/19 1749 07/22/19 1829        Code Status: Full Code   Diet Order            Diet NPO time specified  Diet effective midnight           Diet 2 gram sodium Room service appropriate? Yes; Fluid consistency: Thin; Fluid restriction: 1500 mL Fluid  Diet effective now                 Infusions:   sodium chloride      ceFAZolin (ANCEF) IV 2 g (07/23/19 0910)    Scheduled Meds:  ALPRAZolam  1 mg Oral QHS   apixaban  5 mg Oral BID   Chlorhexidine Gluconate Cloth  6 each Topical Daily   furosemide  40 mg Intravenous Q12H   metoprolol succinate  12.5 mg Oral Daily   PARoxetine  30 mg Oral QPM   sodium chloride flush  3 mL Intravenous Q12H    PRN meds: sodium chloride, acetaminophen, fluticasone, loratadine, ondansetron (ZOFRAN) IV, oxyCODONE-acetaminophen, sodium chloride flush   Objective: Vitals:   07/23/19 0724 07/23/19 1230  BP: 133/62 (!) 145/76  Pulse: 66 68  Resp: 16 18  Temp: 99.2 F (37.3 C) 99.1 F (37.3 C)  SpO2: 100% 99%    Intake/Output Summary (Last 24 hours) at 07/23/2019 1524 Last data filed  at 07/23/2019 1228 Gross per 24 hour  Intake 820 ml  Output 3500 ml  Net -2680 ml   Filed Weights   07/22/19 1101 07/22/19 2001 07/23/19 0350  Weight: 76.7 kg 74.2 kg 72.9 kg   Weight change:  Body mass index is 23.07 kg/m.   Physical Exam: General exam: Appears calm and comfortable.  Skin: No rashes, lesions or ulcers. HEENT: Atraumatic, normocephalic, supple neck, no obvious bleeding Lungs: Clear to auscultation bilaterally CVS: Irregular controlled rate, no murmur GI/Abd soft, nontender, nondistended, bowel sound present CNS: Alert, awake, oriented x3 Psychiatry: Mood appropriate Extremities: Pedal edema 1+ bilaterally,  bilateral lower extremity redness.  Data Review: I have personally reviewed the laboratory data and studies available.  Recent Labs  Lab 07/22/19 1122  WBC 6.4  NEUTROABS 4.7  HGB 8.6*  HCT 28.1*  MCV 97.2  PLT 247   Recent Labs  Lab 07/22/19 1122 07/23/19 0643  NA 136 138  K 4.2 3.5  CL 102 98  CO2 24 29  GLUCOSE 201* 100*  BUN 16 9  CREATININE 1.26* 1.12  CALCIUM 8.5* 8.9    Signed, Terrilee Croak, MD [facility] Pager: [phone] (Secure Chat preferred). 07/23/2019

## 2019-07-23 NOTE — Consult Note (Signed)
Cardiology Consultation:   Patient ID: Neil Cordova MRN: [id number]; DOB: [birth year]  Admit date: 07/22/2019 Date of Consult: 07/23/2019  Primary Care Provider: Patient, No Pcp Per [facility] Cardiologist: Neil Dresser, MD  [place] Electrophysiologist:  None    Patient Profile:   Neil Cordova is a 79 y.o. male with a history of persistent atrial flutter on Eliquis, chronic combined CHF, hypertension, and recent left lumbar L4-L5 microdiscectomy complicated by wound infection with sepsis and bacteremia who is being seen today for the evaluation of acute CHF at the request of Dr. Cruzita Cordova.  History of Present Illness:   Neil Cordova is a 79 year old male with the above history who is a patient of Dr. Judeth Cordova. Patient had no known cardiac problems until recently. He underwent left L4-L5 microdiscectomy by Dr. Kathyrn Cordova on 06/17/2019. He was then admitted on 06/30/2019 with sepsis secondary to lumbar wound infection after presenting with progressive weakness, confusion, and fever of 102.8. Blood cultures came back positive for MSSA. He was started on IV antibiotics and underwent washout on 5/25. He was also underwent IR guided aspiration with placement of drain on 5/29 Echo showed LVEF of 50-55% with normal wall motion. However, TEE showed LVEF of 35-40% with global hypokinesis and mild MR. Cardiology was consulted for atrial flutter with variable AV conduction. Patient denied any cardiac symptoms at the time and reported being very active prior to his herniated disc running 3-6 miles multiple times per week without any problems. Patient was started on Metoprolol and Eliquis after all procedures were completed. He was already on Lisinopril at home for hypertension so this was continued given low EF. He was rate controlled at the time. Therefore, plan was to follow-up as an outpatient and consider DCCV if he was still in atrial flutter and consider ischemic evaluation if EF still  low on repeat Echo. He was discharged on 07/10/2019 with home health OT/PT/RN. Of note, it does not look like he was discharged on Metoprolol.   Patient's wife sent our office a MyChart message on 07/17/2019 with reports of lower extremity edema and 30lb weight gain. We did not have any office visits available so he was ultimately advised to go to the ED on 07/22/2019.   Patient presented to the ED as instructed. Upon arrival to the ED, patient hypertensive but vital stable. EKG showed atrial flutter, rate 66 bpm, with 4:1 AV conduction. BNP elevated at 559.2. Chest x-ray showed trace bilateral pleural effusions. WBC 6.4, Hgb 8.6, Plts 247. Na 136, K 4.2, Glucose 201, BUN 16, Creatinine 1.26. Albumin 2.1, AST 13, ALT <5, Alk Phos 76, Total Bili 0.8. Lactic acid normal. COVID-19 negative. Patient admitted to Hospitalist service for CHF and started on IV Lasix. Cardiology consulted for further evaluation.  At the time of this evaluation, patient resting comfortably in bed. He has had good urinary response to the Lasix so far and he feel lower extremity edema is already improving some. He states the lower extremity edema and weight gain started almost immediately after being discharge. About 2 days ago, he also noticed redness of bilateral legs but no pain. He denies any shortness of breath, orthopnea, or PND. No chest pain. He notes he can sometimes feel his heart beat in his head but no real palpitations. No lightheadedness, dizziness, or syncope. He admits to eating a lot of salty peanuts since being home which likely contributed to this CHF exacerbation. No fevers since going home. Tolerating the Eliquis well with no signs  of abnormal bleeding in urine or stool.  Past Medical History:  Diagnosis Date  . Hypertension     Past Surgical History:  Procedure Laterality Date  . BACK SURGERY    . IR US GUIDE BX ASP/DRAIN  07/05/2019  . LUMBAR WOUND DEBRIDEMENT N/A 07/01/2019   Procedure: LUMBAR WOUND  EXPLORATION;  Surgeon: Consuella Lose, MD;  Location: [facility];  Service: Neurosurgery;  Laterality: N/A;  posterior  . TEE WITHOUT CARDIOVERSION N/A 07/04/2019   Procedure: TRANSESOPHAGEAL ECHOCARDIOGRAM (TEE);  Surgeon: Acie Fredrickson Wonda Cheng, MD;  [facility] ENDOSCOPY;  Service: Cardiovascular;  Laterality: N/A;     Home Medications:  Prior to Admission medications   Medication Sig Start Date End Date Taking? Authorizing Provider  acetaminophen (TYLENOL) 325 MG tablet Take 650 mg by mouth every 6 (six) hours as needed for mild pain, moderate pain or headache.    Yes [provider]  ALPRAZolam Duanne Moron) 1 MG tablet Take 1 mg by mouth at bedtime.   Yes [provider]  apixaban (ELIQUIS) 5 MG TABS tablet Take 1 tablet (5 mg total) by mouth 2 (two) times daily. 07/10/19  Yes Costella, Vista Mink, PA-C  fluticasone (FLONASE) 50 MCG/ACT nasal spray Place 1 spray into both nostrils daily as needed for allergies or rhinitis.   Yes [provider]  furosemide (LASIX) 20 MG tablet Take 20 mg by mouth daily. 07/21/19 07/26/19 Yes [provider]  lisinopril (ZESTRIL) 10 MG tablet Take 10 mg by mouth at bedtime.    Yes [provider]  loratadine (CLARITIN) 10 MG tablet Take 10 mg by mouth daily as needed for allergies.   Yes [provider]  nafcillin IVPB Inject 12 g into the vein daily. As a continuous infusion via PICC line.Indication:  MSSA bacteremia/discitis/ventral epidural abscess First Dose: Yes Last Day of Therapy:  08/17/2019 Labs - Twice weekly:  CBC/D and BMP, Labs - Every other week:  ESR and CRP Method of administration: Elastomeric (Continuous infusion) Method of administration may be changed at the discretion of home infusion pharmacist based upon assessment of the patient and/or caregiver's ability to self-administer the medication ordered. 07/10/19 08/19/19 Yes Costella, Vista Mink, PA-C  oxyCODONE-acetaminophen (PERCOCET) 7.5-325 MG tablet  Take 0.5-1 tablets by mouth every 4 (four) hours as needed for severe pain.    Yes [provider]  PARoxetine (PAXIL) 30 MG tablet Take 30 mg by mouth every evening.   Yes [provider]  polyethylene glycol powder (GLYCOLAX/MIRALAX) 17 GM/SCOOP powder Take 17 g by mouth daily as needed for mild constipation.   Yes [provider]  Probiotic Product (PROBIOTIC DAILY) CAPS Take 1 capsule by mouth daily.   Yes [provider]  psyllium (METAMUCIL) 58.6 % packet Take 1 packet by mouth See admin instructions. Mix and drink 1 packet by mouth every other night   Yes [provider]  Turmeric 500 MG TABS Take 500 mg by mouth daily.   Yes [provider]  ibuprofen (ADVIL) 200 MG tablet Take 200 mg by mouth every 6 (six) hours as needed for mild pain or moderate pain. Patient not taking: Reported on 07/22/2019    [provider]  sildenafil (VIAGRA) 100 MG tablet Take 100 mg by mouth daily as needed for erectile dysfunction. Patient not taking: Reported on 07/22/2019    [provider]    Inpatient Medications: Scheduled Meds: . ALPRAZolam  1 mg Oral QHS  . apixaban  5 mg Oral BID  .  Chlorhexidine Gluconate Cloth  6 each Topical Daily  . furosemide  40 mg Intravenous Q12H  . PARoxetine  30 mg Oral QPM  . sodium chloride flush  3 mL Intravenous Q12H   Continuous Infusions: . sodium chloride    .  ceFAZolin (ANCEF) IV 2 g (07/23/19 0304)   PRN Meds: sodium chloride, acetaminophen, fluticasone, loratadine, ondansetron (ZOFRAN) IV, oxyCODONE-acetaminophen, sodium chloride flush  Allergies:   No Known Allergies  Social History:   Social History   Socioeconomic History  . Marital status: Married    Spouse name: Not on file  . Number of children: Not on file  . Years of education: Not on file  . Highest education level: Not on file  Occupational History  . Not on file  Tobacco Use  . Smoking status: Former Research scientist (life sciences)  .  Smokeless tobacco: Current User    Types: Snuff  Vaping Use  . Vaping Use: Never used  Substance and Sexual Activity  . Alcohol use: Not Currently  . Drug use: Not on file  . Sexual activity: Not on file  Other Topics Concern  . Not on file  Social History Narrative  . Not on file   Social Determinants of Health   Financial Resource Strain:   . Difficulty of Paying Living Expenses:   Food Insecurity:   . Worried About Charity fundraiser in the Last Year:   . Arboriculturist in the Last Year:   Transportation Needs:   . Film/video editor (Medical):   Marland Kitchen Lack of Transportation (Non-Medical):   Physical Activity:   . Days of Exercise per Week:   . Minutes of Exercise per Session:   Stress:   . Feeling of Stress :   Social Connections:   . Frequency of Communication with Friends and Family:   . Frequency of Social Gatherings with Friends and Family:   . Attends Religious Services:   . Active Member of Clubs or Organizations:   . Attends Archivist Meetings:   Marland Kitchen Marital Status:   Intimate Partner Violence:   . Fear of Current or Ex-Partner:   . Emotionally Abused:   Marland Kitchen Physically Abused:   . Sexually Abused:     Family History:   Family History  Problem Relation Age of Onset  . Emphysema Father   . CAD Neg Hx   . Heart failure Neg Hx      ROS:  Please see the history of present illness. Review of Systems  Constitutional: Negative for fever.  HENT: Negative for congestion.   Respiratory: Negative for shortness of breath.   Cardiovascular: Positive for leg swelling. Negative for chest pain, palpitations and PND.  Gastrointestinal: Negative for abdominal pain, blood in stool, melena, nausea and vomiting.  Genitourinary: Negative for hematuria.  Musculoskeletal: Positive for back pain.  Skin:       Redness of bilateral lower extremities  Neurological: Negative for dizziness and loss of consciousness.  Endo/Heme/Allergies: Does not bruise/bleed  easily.   Physical Exam/Data:   Vitals:   07/22/19 2001 07/23/19 0047 07/23/19 0350 07/23/19 0724  BP:  (!) 134/59 135/61 133/62  Pulse:  66 68 66  Resp:  19 19 16   Temp:  99 F (37.2 C) 99.2 F (37.3 C) 99.2 F (37.3 C)  TempSrc:  Oral Oral Oral  SpO2:  99% 99% 100%  Weight: 74.2 kg  72.9 kg   Height:        Intake/Output Summary (Last 24  hours) at 07/23/2019 0816 Last data filed at 07/23/2019 0647 Gross per 24 hour  Intake 580 ml  Output 2300 ml  Net -1720 ml   Last 3 Weights 07/23/2019 07/22/2019 07/22/2019  Weight (lbs) 160 lb 12.8 oz 163 lb 8 oz 169 lb  Weight (kg) 72.938 kg 74.163 kg 76.658 kg     Body mass index is 23.07 kg/m.  General: 79 y.o. male resting comfortably in no acute distress. HEENT: Normocephalic and atraumatic. Sclera clear. Neck: Supple. No carotid bruits. Possible JVD elevation. Heart: Irregular rhythm with normal rate. Distinct S1 and S2. No murmurs, gallops, or rubs. Radial and distal pedal pulses 2+ and equal bilaterally. Lungs: No increased work of breathing. Clear to ausculation bilaterally. No wheezes, rhonchi, or rales.  Abdomen: Soft, non-distended, and non-tender to palpation.  MSK: Mild generalized weakness. Ambulating with walker.  Extremities: 3+ pitting edema of bilateral lower extremities.  Skin: Warm and dry. Some erythema of bilateral lower extremities. Not warm to the touch. Neuro: Alert and oriented x3. No focal deficits. Psych: Normal affect. Responds appropriately.  EKG:  The EKG was personally reviewed and demonstrates: Atrial flutter, rate 66 bpm, with 4:1 AV conduction. New T wave inversions in inferior leads and leads V5-V6.   Telemetry:  Telemetry was personally reviewed and demonstrates:  Atrial flutter with variable AV conduction. Baseline rates mostly in the 50's to 70's with brief spikes into the 110's to 120's (suspect this is with ambulation).  Relevant CV Studies:  TTE 07/03/2019: Impressions: 1. Left ventricular  ejection fraction, by estimation, is 50 to 55%. The  left ventricle has low normal function. The left ventricle has no regional  wall motion abnormalities. Left ventricular diastolic parameters are  indeterminate.  2. Right ventricular systolic function is normal. The right ventricular  size is normal.  3. Left atrial size was mild to moderately dilated.  4. The mitral valve is grossly normal. Mild mitral valve regurgitation.  5. The aortic valve is tricuspid. Aortic valve regurgitation is not  visualized.  6. The inferior vena cava is dilated in size with <50% respiratory  variability, suggesting right atrial pressure of 15 mmHg.   Conclusion(s)/Recommendation(s): No evidence of valvular vegetations on  this transthoracic echocardiogram. Would recommend a transesophageal  echocardiogram to exclude infective endocarditis if clinically indicated. _______________  TEE 07/04/2019: Impressions: 1. Left ventricular ejection fraction, by estimation, is 35 to 40%. The  left ventricle has moderately decreased function. The left ventricle  demonstrates global hypokinesis.  2. Right ventricular systolic function is normal. The right ventricular  size is normal.  3. No left atrial/left atrial appendage thrombus was detected.  4. The mitral valve is grossly normal. Mild mitral valve regurgitation.  No evidence of mitral stenosis.  5. The aortic valve is normal in structure. Aortic valve regurgitation is  not visualized. No aortic stenosis is present.    Laboratory Data:  High Sensitivity Troponin:  No results for input(s): TROPONINIHS in the last 720 hours.   Chemistry Recent Labs  Lab 07/22/19 1122 07/23/19 0643  NA 136 138  K 4.2 3.5  CL 102 98  CO2 24 29  GLUCOSE 201* 100*  BUN 16 9  CREATININE 1.26* 1.12  CALCIUM 8.5* 8.9  GFRNONAA 54* >60  GFRAA >60 >60  ANIONGAP 10 11    Recent Labs  Lab 07/22/19 1122  PROT 5.9*  ALBUMIN 2.1*  AST 13*  ALT <5  ALKPHOS 76    BILITOT 0.8   Hematology Recent Labs  Lab 07/22/19 1122  WBC 6.4  RBC 2.89*  HGB 8.6*  HCT 28.1*  MCV 97.2  MCH 29.8  MCHC 30.6  RDW 15.5  PLT 247   BNP Recent Labs  Lab 07/22/19 1711  BNP 559.2*    DDimer No results for input(s): DDIMER in the last 168 hours.   Radiology/Studies:  DG Chest 2 View  Result Date: 07/22/2019 CLINICAL DATA:  Lower extremity swelling. EXAM: CHEST - 2 VIEW COMPARISON:  Chest x-ray dated July 08, 2019. FINDINGS: Unchanged right upper extremity PICC line. The heart size and mediastinal contours are within normal limits. Normal pulmonary vascularity. Trace bilateral pleural effusions. No consolidation or pneumothorax. No acute osseous abnormality. IMPRESSION: 1. Trace bilateral pleural effusions. Electronically Signed   By: Titus Dubin M.D.   On: 07/22/2019 11:46   Assessment and Plan:   Acute on Chronic Combined CHF - Patient presents with lower extremity edema and 20-30lb weight gain. No dyspnea. - BNP elevated in the 500's.  - Chest x-ray showed trace bilateral pleural effusions.  - TTE on 5/27 showed LVEF of 50-55% with normal wall motions. TEE on 5/28 showed LVEF of 35-40% with global hypokinesis.  - Repeat Echo pending. - Currently on IV Lasix 20m twice daily with good urinary response. Documented 3 L of urinary output in the past 24 hours. Renal function stable. Weight 160 lbs today. Dry weight around 147 lbs.  - Continue current dose of Lasix. - Will add Toprol-XL 12.595mdaily. - Patient on Lisinopril at home but currently held. Will hold off on restarting on we can reassess EF. If EF still low, Entresto may be better option. - Monitor daily weight, strict I/O's, and renal function.  Persistent Atrial Flutter - Rate controlled. - Will add Toprol-XL 12.95m50maily. - CHA2DS2-VASc = 4 (CHF, HTN, age x2). Continue Eliquis 95mg595mice daily.  - Atrial flutter possibly contributing to CHF exacerbation. Consider TEE/DCCV this admission.  Family would rather have this done inpatient rather than outpatient.   Hypertension - BP currently well controlled.  - Add Toprol as above. - Continue diuresis.   For questions or updates, please contact [facility] consult [email or web address] for contact info under    Signed, CallDarreld Mclean-C  07/23/2019 8:16 AM

## 2019-07-23 NOTE — Plan of Care (Signed)

## 2019-07-23 NOTE — Progress Notes (Signed)
  Echocardiogram 2D Echocardiogram has been performed.  Neil Cordova 07/23/2019, 2:38 PM

## 2019-07-24 LAB — BASIC METABOLIC PANEL
Anion gap: 11 (ref 5–15)
BUN: 10 mg/dL (ref 8–23)
CO2: 28 mmol/L (ref 22–32)
Calcium: 8.7 mg/dL — ABNORMAL LOW (ref 8.9–10.3)
Chloride: 97 mmol/L — ABNORMAL LOW (ref 98–111)
Creatinine, Ser: 1.09 mg/dL (ref 0.61–1.24)
GFR calc Af Amer: >60 mL/min (ref >60)
GFR calc non Af Amer: >60 mL/min (ref >60)
Glucose, Bld: 110 mg/dL — ABNORMAL HIGH (ref 70–99)
Potassium: 3.4 mmol/L — ABNORMAL LOW (ref 3.5–5.1)
Sodium: 136 mmol/L (ref 135–145)

## 2019-07-24 LAB — HEMOGLOBIN A1C
Hgb A1c MFr Bld: 6.3 % — ABNORMAL HIGH (ref 4.8–5.6)
Mean Plasma Glucose: 134 mg/dL

## 2019-07-24 MED ORDER — RESOURCE THICKENUP CLEAR PO POWD: ORAL | Status: DC | PRN

## 2019-07-24 MED ORDER — POLYETHYLENE GLYCOL 3350 17 G PO PACK
17.0000 g | PACK | Freq: Every day | ORAL | Status: DC
  Administered 2019-07-24 – 2019-07-27 (×4): 17 g via ORAL
  Filled 2019-07-24 (×5): qty 1

## 2019-07-24 MED ORDER — SODIUM CHLORIDE 0.9% FLUSH
5.0000 mL | Freq: Three times a day (TID) | INTRAVENOUS | Status: DC
  Administered 2019-07-24 – 2019-07-28 (×6): 5 mL

## 2019-07-24 MED ORDER — POTASSIUM CHLORIDE CRYS ER 20 MEQ PO TBCR
40.0000 meq | EXTENDED_RELEASE_TABLET | Freq: Once | ORAL | Status: AC
  Administered 2019-07-24: 40 meq via ORAL
  Filled 2019-07-24: qty 2

## 2019-07-24 NOTE — Progress Notes (Signed)
PROGRESS NOTE  Neil Cordova  DOB: [birth year]  PCP: Patient, No Pcp Per MGQ:[id number]  DOA: 07/22/2019  LOS: 2 days   Chief Complaint  Patient presents with  . Blood Infection  . Chest Pain   Brief narrative: Patient is a 79 y.o. male  who was physically very active and did not have any past medical issues until recent surgery.   5/10, patient underwent L4-L5 microdiscectomy on 06/16/2019 by Dr. Kathyrn Sheriff 5/24 - 6/3, patient was hospitalized with acute metabolic encephalopathy with delirium. He was found to have MSSA bacteremia in the setting of surgical wound infection/ventral abscess.  He underwent repeat surgery cultures from the abscess which grew MSSA as well.  He was sent home with intravenous antibiotics (nafcillin) via a PICC line for 6 weeks with end date 7/11. During that hospital stay he was also diagnosed with new onset a flutter, he was rate controlled on metoprolol.  He was placed on Eliquis by cardiology.  2D echo was done showed an EF of 50-55%, no WMA however subsequent TEE to look for endocarditis actually showed a low EF of 30-35% with moderate to severe LV dysfunction. Apparently at discharge, he was not sent on cardiac medications.  Post discharge, patient started having progressive lower extremity swelling from the waist down and gained about 30 pounds. His legs became painful and red.   Also, when he was discharged did not have any antibiotics at home for at least 36 hours and he was quite upset about that.    Patient was admitted under hospitalist service for CHF exacerbation. Cardiology consultation was obtained.  Subjective: Patient was seen and examined this morning.   Sitting up in bed.  Not in distress.  Not on supplemental oxygen.   Bilateral lower extremity swelling improving.  Daughter at bedside.  Assessment/Plan: Acute on chronic systolic CHF -In last hospitalization, he was noted to have EF 30 to 35% with moderate to severe LV dysfunction. -Bilateral  pedal edema started and progressively worsened since last hospitalization.  Not on diuretics at home.  -Cardiology consult appreciated.  -Echocardiogram repeated on 6/16 shows improvement in EF to 55 to 60%. -Patient is currently on Toprol 12.5 mg daily and Lasix IV 40 mg daily.  -Stable blood pressure, renal function. -Continue daily weights, strict I's and O's, telemetry monitoring.  -Monitor blood pressure, renal function and electrolytes. -Potassium level low at 3.4 today.  40 mg oral potassium 1 dose ordered.  A flutter, rate controlled -EKG in the ED showed A flutter with 4-1 conduction.   -Cardiology has started the patient on metoprolol low-dose.   -Noted a plan to do a TEE-cardioversion on 6/18.   -Eliquis has been resumed.  Recent MSSA bacteremia/MSSA abscess/postop complication -Follow prolonged course of IV Ancef at home planned for 6 weeks with end date 7/11. -He has a drain in place placed by IR on 5/29. -6/16, patient underwent CT scan of abdomen pelvis and evaluation by IR. -Noted significant reduction in size of the fluid collection with drain placement.  Noted a plan to remove drain in 1 to 2 days once the output is less than 10 mL.Marland Kitchen    Anemia of chronic disease -Patient denies any past history of blood loss, EGD or colonoscopy.   -Since surgery, patient's hemoglobin has been trending less than 10 mostly.   -Recent iron panel with ferritin level more than 500, but with low iron level and TIBC.  Levels and ferritin was elevated because of inflammation. Repeat ferritin level tomorrow. -  Continue to follow hemoglobin. If hemoglobin starts to drop down, may need GI evaluation.  -Otherwise recommend outpatient evaluation.  Hyperglycemia -Glucose on the initial BMP is 201.  No history of DM.   -A1c 6.3.  Possible cellulitis -Both of his lower extremities are red associated with swelling.  Could be from CHF itself but also cannot rule out possibility of coexisting  cellulitis.  Patient is already on IV Ancef.   -Redness does not look any worse today.  Continue to monitor clinically.  Monitor temperature trend and WBC trend.  Mobility: Encourage ambulation.  PT evaluation Code Status:  Full code  DVT prophylaxis:apixaban (ELIQUIS) tablet 5 mg  Antimicrobials:  IV Ancef Fluid: None Diet: Cardiac diet  Consultants: Cardiology Family Communication:  Spoke with patient's daughter Ms. Megan at bedside.  Status is: Inpatient Remains inpatient appropriate because:IV treatments appropriate due to intensity of illness or inability to take PO and Inpatient level of care appropriate due to severity of illness   Dispo: The patient is from: Home              Anticipated d/c is to: Home              Anticipated d/c date is: 2 days              Patient currently is not medically stable to d/c.  Antimicrobials: Anti-infectives (From admission, onward)   Start     Dose/Rate Route Frequency Ordered Stop   07/23/19 0200  ceFAZolin (ANCEF) IVPB 2g/100 mL premix     Discontinue     2 g 200 mL/hr over 30 Minutes Intravenous Every 8 hours 07/22/19 1829     07/22/19 1800  nafcillin 12 g in dextrose 5 % 50 mL IVPB  Status:  Discontinued        12 g 100 mL/hr over 30 Minutes Intravenous Daily 07/22/19 1749 07/22/19 1829        Code Status: Full Code   Diet Order            Diet NPO time specified  Diet effective midnight           Diet 2 gram sodium Room service appropriate? Yes; Fluid consistency: Thin; Fluid restriction: 1500 mL Fluid  Diet effective now                 Infusions:  . sodium chloride    .  ceFAZolin (ANCEF) IV 2 g (07/24/19 7353)    Scheduled Meds: . ALPRAZolam  1 mg Oral QHS  . apixaban  5 mg Oral BID  . Chlorhexidine Gluconate Cloth  6 each Topical Daily  . furosemide  40 mg Intravenous Q12H  . PARoxetine  30 mg Oral QPM  . sodium chloride flush  3 mL Intravenous Q12H    PRN meds: sodium chloride, acetaminophen,  fluticasone, loratadine, ondansetron (ZOFRAN) IV, oxyCODONE-acetaminophen, sodium chloride flush   Objective: Vitals:   07/23/19 2038 07/24/19 0411  BP: (!) 125/58 (!) 144/66  Pulse: 67 66  Resp: 19 19  Temp: 99.5 F (37.5 C) (!) 97.5 F (36.4 C)  SpO2: 98% 98%    Intake/Output Summary (Last 24 hours) at 07/24/2019 1055 Last data filed at 07/24/2019 0600 Gross per 24 hour  Intake 720 ml  Output 3190 ml  Net -2470 ml   Filed Weights   07/22/19 2001 07/23/19 0350 07/24/19 0411  Weight: 74.2 kg 72.9 kg 68.1 kg   Weight change: -8.573 kg Body mass index is 21.54  kg/m.   Physical Exam: General exam: Appears calm and comfortable.  Not in physical distress Skin: No rashes, lesions or ulcers. HEENT: Atraumatic, normocephalic, supple neck, no obvious bleeding Lungs: Clear to auscultation bilaterally CVS: Irregular controlled rate, no murmur GI/Abd soft, nontender, nondistended, bowel sound present CNS: Alert, awake, oriented x3 Psychiatry: Mood appropriate Extremities: Improving bilateral pedal edema 1+ bilaterally, bilateral lower extremity redness.  Data Review: I have personally reviewed the laboratory data and studies available.  Recent Labs  Lab 07/22/19 1122  WBC 6.4  NEUTROABS 4.7  HGB 8.6*  HCT 28.1*  MCV 97.2  PLT 247   Recent Labs  Lab 07/22/19 1122 07/23/19 0643 07/24/19 0549  NA 136 138 136  K 4.2 3.5 3.4*  CL 102 98 97*  CO2 24 29 28   GLUCOSE 201* 100* 110*  BUN 16 9 10   CREATININE 1.26* 1.12 1.09  CALCIUM 8.5* 8.9 8.7*   Recent Labs    07/23/19 1530  HGBA1C 6.3*     Signed, , MD [facility] Pager: [phone] (Secure Chat preferred). 07/24/2019

## 2019-07-24 NOTE — TOC Initial Note (Addendum)
Transition of Care [facility]) - Initial/Assessment Note    Patient Details  Name: Neil Cordova MRN: [id number] Date of Birth: 08/28/1940  Transition of [facility]) CM/SW Contact:    Sherrilyn Rist Advanced Care Supervisor Phone Number: [phone] 07/24/2019, 9:53 AM  Clinical Narrative:                 CM talked to patient at the bedside with daughter Neil Cordova present. Lives at home with spouse, continues to drive;  Patient travels a lot and needs a PCP in [place]; unable to establish Primary care with [facility]- pt made aware; Follow up apt made with Dr Abelino Derrick with [facility] for June 24,2021 at 11:30am; Has private insurance with Medicare / BCBS with prescription drug coverage; pharmacy of choice is [facility] in [place]. Patient reports not problem getting medication. He is very active, drives and is independent of all of his ADL's; has [place] services arranged with [facility] HHRN for IV infusion- Carolynn Sayers RN following. No DME; TOC will continue to follow for progression of care. Cheryl with [facility] is following for home IV antibiotics at discharge.  Expected Discharge Plan: [place] Barriers to Discharge: No Barriers Identified   Patient Goals and CMS Choice Patient states their goals for this hospitalization and ongoing recovery are:: To get out of here and back home CMS Medicare.gov Compare Post Acute Care list provided to:: Other (Comment Required) (Patient has [place] services as prior to admission) Choice offered to / list presented to : NA  Expected Discharge Plan and Services Expected Discharge Plan: [place] In-house Referral: NA Discharge Planning Services: CM Consult Post Acute Care Choice: Hospice Living arrangements for the past 2 months: Single Family Home                   DME Agency: NA       HH Arranged:  ([place] with [facility])          Prior Living Arrangements/Services Living  arrangements for the past 2 months: Single Family Home Lives with:: Spouse Patient language and need for interpreter reviewed:: No Do you feel safe going back to the place where you live?: Yes      Need for Family Participation in Patient Care: No (Comment) Care giver support system in place?: Yes (comment)   Criminal Activity/Legal Involvement Pertinent to Current Situation/Hospitalization: No - Comment as needed  Activities of Daily Living Home Assistive Devices/Equipment: Eyeglasses, Walker (specify type) ADL Screening (condition at time of admission) Patient's cognitive ability adequate to safely complete daily activities?: Yes Is the patient deaf or have difficulty hearing?: No Does the patient have difficulty seeing, even when wearing glasses/contacts?: No Does the patient have difficulty concentrating, remembering, or making decisions?: No Patient able to express need for assistance with ADLs?: Yes Does the patient have difficulty dressing or bathing?: No Independently performs ADLs?: Yes (appropriate for developmental age) Does the patient have difficulty walking or climbing stairs?: No Weakness of Legs: None Weakness of Arms/Hands: None  Permission Sought/Granted Permission sought to share information with : Case Manager Permission granted to share information with : Yes, Verbal Permission Granted     Permission granted to share info w AGENCY: [place] agency  Permission granted to share info w Relationship: Megan/ Daughter  Permission granted to share info w Contact Information: [name]/ spouse  Emotional Assessment Appearance:: Appears stated age Attitude/Demeanor/Rapport: Charismatic Affect (typically observed): Accepting Orientation: : Oriented to Self,  Oriented to Place, Oriented to  Time, Oriented to Situation Alcohol / Substance Use: Not Applicable Psych Involvement: No (comment)  Admission diagnosis:  Abscess [L02.91] Acute systolic CHF (congestive heart failure)  (HCC) [I50.21] Acute congestive heart failure, unspecified heart failure type [facility]) [I50.9] Patient Active Problem List   Diagnosis Date Noted  . Acute systolic CHF (congestive heart failure) (HCC) 07/22/2019  . Abscess   . SIRS (systemic inflammatory response syndrome) (HCC)   . Epidural abscess   . Acute combined systolic and diastolic congestive heart failure (HCC)   . Bacteremia   . Acute metabolic encephalopathy 07/03/2019  . Delirium 07/03/2019  . Unspecified atrial fibrillation (HCC) 07/03/2019  . Atrial flutter (HCC) 07/03/2019  . Pressure injury of skin 07/01/2019  . Wound infection after surgery 06/30/2019  . Hyponatremia 06/30/2019   PCP:  Patient, No Pcp Per Pharmacy:   [id number] - [place], [place] - [address] AT [facility] [address] [place] [address] Phone: [phone] Fax: [phone]     Social Determinants of Health (SDOH) Interventions    Readmission Risk Interventions No flowsheet data found.

## 2019-07-24 NOTE — Progress Notes (Signed)
52mL out of serosangious drainage from JP drain

## 2019-07-24 NOTE — Progress Notes (Signed)
Vernon Prey PA was notified of brady cardiac episode in the 30's. Upon assessment by primary RN, pt was found to be awake, alert and oriented x4 with no signs of distress noted or concerns. She stated she will be see the pt shortly. Metoprolol was discontinued and EKG 12lead was ordered.   Will continue to monitor.

## 2019-07-24 NOTE — H&P (View-Only) (Signed)
Progress Note  Patient Name: Neil Cordova Date of Encounter: 07/24/2019  [facility] Cardiologist: Jodelle Red, MD   Subjective   Patient put out 3.8L urine overnight. Swelling is improving. No chest pain. I was notified about heart rates in the 30-40, patient asymptomatic.   Inpatient Medications    Scheduled Meds: . ALPRAZolam  1 mg Oral QHS  . apixaban  5 mg Oral BID  . Chlorhexidine Gluconate Cloth  6 each Topical Daily  . furosemide  40 mg Intravenous Q12H  . metoprolol succinate  12.5 mg Oral Daily  . PARoxetine  30 mg Oral QPM  . sodium chloride flush  3 mL Intravenous Q12H   Continuous Infusions: . sodium chloride    .  ceFAZolin (ANCEF) IV 2 g (07/24/19 0811)   PRN Meds: sodium chloride, acetaminophen, fluticasone, loratadine, ondansetron (ZOFRAN) IV, oxyCODONE-acetaminophen, sodium chloride flush   Vital Signs    Vitals:   07/23/19 0724 07/23/19 1230 07/23/19 2038 07/24/19 0411  BP: 133/62 (!) 145/76 (!) 125/58 (!) 144/66  Pulse: 66 68 67 66  Resp: 16 18 19 19   Temp: 99.2 F (37.3 C) 99.1 F (37.3 C) 99.5 F (37.5 C) (!) 97.5 F (36.4 C)  TempSrc: Oral Oral Oral Oral  SpO2: 100% 99% 98% 98%  Weight:    68.1 kg  Height:        Intake/Output Summary (Last 24 hours) at 07/24/2019 1016 Last data filed at 07/24/2019 0600 Gross per 24 hour  Intake 720 ml  Output 3190 ml  Net -2470 ml   Last 3 Weights 07/24/2019 07/23/2019 07/22/2019  Weight (lbs) 150 lb 1.6 oz 160 lb 12.8 oz 163 lb 8 oz  Weight (kg) 68.085 kg 72.938 kg 74.163 kg      Telemetry    Aflutter with rates in the 50 and transiently down into the 30s - Personally Reviewed  ECG    pending - Personally Reviewed  Physical Exam   GEN: No acute distress.   Neck: No JVD Cardiac: Irreg Irreg, no murmurs, rubs, or gallops.  Respiratory: Clear to auscultation bilaterally. GI: Soft, nontender, non-distended  MS: 3+ edema; No deformity; erythema lower extremities Neuro:   Nonfocal  Psych: Normal affect   Labs    High Sensitivity Troponin:  No results for input(s): TROPONINIHS in the last 720 hours.    Chemistry Recent Labs  Lab 07/22/19 1122 07/23/19 0643 07/24/19 0549  NA 136 138 136  K 4.2 3.5 3.4*  CL 102 98 97*  CO2 24 29 28   GLUCOSE 201* 100* 110*  BUN 16 9 10   CREATININE 1.26* 1.12 1.09  CALCIUM 8.5* 8.9 8.7*  PROT 5.9*  --   --   ALBUMIN 2.1*  --   --   AST 13*  --   --   ALT <5  --   --   ALKPHOS 76  --   --   BILITOT 0.8  --   --   GFRNONAA 54* >60 >60  GFRAA >60 >60 >60  ANIONGAP 10 11 11      Hematology Recent Labs  Lab 07/22/19 1122  WBC 6.4  RBC 2.89*  HGB 8.6*  HCT 28.1*  MCV 97.2  MCH 29.8  MCHC 30.6  RDW 15.5  PLT 247    BNP Recent Labs  Lab 07/22/19 1711  BNP 559.2*     DDimer No results for input(s): DDIMER in the last 168 hours.   Radiology    DG Chest 2 View  Result Date: 07/22/2019 CLINICAL DATA:  Lower extremity swelling. EXAM: CHEST - 2 VIEW COMPARISON:  Chest x-ray dated July 08, 2019. FINDINGS: Unchanged right upper extremity PICC line. The heart size and mediastinal contours are within normal limits. Normal pulmonary vascularity. Trace bilateral pleural effusions. No consolidation or pneumothorax. No acute osseous abnormality. IMPRESSION: 1. Trace bilateral pleural effusions. Electronically Signed   By: Obie Dredge M.D.   On: 07/22/2019 11:46   CT ABDOMEN PELVIS W CONTRAST  Result Date: 07/24/2019 CLINICAL DATA:  History of abscess within the subcutaneous tissues about the caudal aspect of the left flank following L4-L5 laminectomy, post ultrasound-guided placement of percutaneous catheter into the subcutaneous flank abscess on 07/05/2019. EXAM: CT ABDOMEN AND PELVIS WITH CONTRAST TECHNIQUE: Multidetector CT imaging of the abdomen and pelvis was performed using the standard protocol following bolus administration of intravenous contrast. CONTRAST:  OMNIPAQUE IOHEXOL 300 MG/ML  SOLN  COMPARISON:  Ultrasound-guided subcutaneous drainage catheter placement-07/05/2019 Lumbar spine MRI-07/04/2019 FINDINGS: Lower chest: Limited visualization of the lower thorax demonstrates small/trace bilateral effusions with associated bibasilar atelectasis, right greater than left. Note is made of a punctate (approximately 0.4 cm granuloma within the right lower lobe (image 8, series 5). Normal heart size. Coronary artery calcifications. No pericardial effusion. Hepatobiliary: Normal hepatic contour. There are multiple punctate subcentimeter hypoattenuating hepatic lesions which are too small to adequately characterize though favored to represent hepatic cysts. Note is made of multiple punctate calcified hepatic granulomas. There is a minimal amount of focal fatty infiltration adjacent to the fissure for the ligamentum teres. No discrete worrisome hepatic lesions. Normal appearance of the gallbladder given degree distention. No radiopaque gallstones. No intra or extrahepatic biliary ductal dilatation. No ascites. Pancreas: Normal appearance of the pancreas. Spleen: Multiple punctate granulomas are seen within otherwise normal-appearing spleen. Adrenals/Urinary Tract: Symmetric enhancement and excretion of the bilateral kidneys. No evidence of nephrolithiasis on this postcontrast examination. Note is made of an approximately 1.4 cm hypoattenuating nonenhancing left-sided renal cyst. No discrete right-sided renal lesions. No urinary obstruction or perinephric stranding. There is mild thickening of the left adrenal gland without discrete nodule. Normal appearance of the right adrenal gland. Normal appearance of the urinary bladder given degree distention. Stomach/Bowel: Extensive colonic diverticulosis, primarily involving the sigmoid colon, without evidence of superimposed acute diverticulitis. Moderate to large colonic stool burden without evidence of enteric obstruction. Normal appearance of the terminal ileum and  the retrocecal appendix. No discrete areas of bowel wall thickening. No pneumoperitoneum, pneumatosis or portal venous gas. Vascular/Lymphatic: Moderate amount of slightly irregular mixed noncalcified though predominantly calcified atherosclerotic plaque within a normal caliber abdominal aorta. The major branch vessels of the abdominal aorta appear patent on this non CTA examination. No bulky retroperitoneal, mesenteric, pelvic or inguinal lymphadenopathy. Reproductive: Prostatomegaly with mass effect on the undersurface of the urinary bladder. No free fluid the pelvic cul-de-sac. Other: Interval reduction/near resolution of subcutaneous fluid collection about the caudal aspect of the left lower back/flank with residual serpiginous collection measuring approximately 8.8 x 6.4 x 0.9 cm (sagittal image 118, series 7; axial image 43, series 6). Diffuse body wall anasarca, most conspicuous about the bilateral flanks and lateral thighs bilaterally. Musculoskeletal: Post left-sided L4 laminectomy (axial image 45, series 3), with suspected residual ventral epidural thickening extending from L2 through L4, suboptimally evaluated. Severe DDD of L2-L3 and L3-L4 with disc space height loss, endplate irregularity and small posteriorly directed disc osteophyte complex at L3-L4. Additionally, there is endplate lucency about the L2-L3 and L3-L4 intervertebral disc spaces,  most conspicuous at L2-L3 (coronal image 96, series 7) which is nonspecific though could be seen in the setting of discitis/osteomyelitis as was suggested on lumbar spine MRI performed 07/04/2019 Mild degenerative change of the bilateral hips, left greater than right, with joint space loss, subchondral sclerosis and osteophytosis. IMPRESSION: 1. Post percutaneous drainage catheter placement with interval reduction/near resolution of serpiginous fluid collection within the subcutaneous tissues about the left lower back/flank. No new definable/drainable fluid  collections. 2. Suspected ventral epidural thickening extending from L2-L4, incompletely evaluated on the present examination though previously was worrisome for an epidural abscess. Additionally, there is severe DDD of L2-L3 and L3-L4, however given associated endplate lucency findings could be attributable to discitis/osteomyelitis (as was suggested on previous lumbar spine MRI). Further evaluation with repeat contrast-enhanced lumbar spine MRI as indicated. 3. Small/trace bilateral effusions with mild diffuse body wall anasarca, nonspecific though could be seen in the setting of congestive heart failure. Clinical correlation is advised. 4. Extensive colonic diverticulosis, primarily involving the sigmoid colon, without evidence of superimposed acute diverticulitis. 5. Large colonic stool burden without evidence of enteric obstruction. 6. Prostatomegaly with mass effect on the undersurface of the urinary bladder. If not recently performed, further evaluation with DRE is advised. 7. See quell a of previous granulomatous infection as above. 8.  Aortic Atherosclerosis (ICD10-I70.0). Electronically Signed   By: Simonne ComeJohn  Watts M.D.   On: 07/24/2019 08:12   ECHOCARDIOGRAM COMPLETE  Result Date: 07/23/2019    ECHOCARDIOGRAM REPORT   Patient Name:   Naoma DienerJERRY Olesen Date of Exam: 07/23/2019 Medical Rec #:  [id number]     Height:       70.0 in Accession #:    [id number]    Weight:       160.8 lb Date of Birth:  1940-06-02     BSA:          1.902 m Patient Age:    79 years      BP:           145/76 mmHg Patient Gender: M             HR:           168 bpm. Exam Location:  Inpatient Procedure: 2D Echo Indications:    CHF-Acute Systolic 428.21 / I50.21  History:        Patient has prior history of Echocardiogram examinations, most                 recent 07/03/2019. Risk Factors:Hypertension.  Sonographer:    Thurman Coyerasey Kirkpatrick RDCS (AE) Referring Phys: [id number] Daylene KatayamaOSTIN M [facility] Institute[facility]ERGHE IMPRESSIONS  1. Left ventricular ejection fraction, by  estimation, is 55 to 60%. The left ventricle has normal function. The left ventricle has no regional wall motion abnormalities. Left ventricular diastolic parameters are indeterminate.  2. Right ventricular systolic function is normal. The right ventricular size is normal.  3. Left atrial size was mildly dilated.  4. The mitral valve is normal in structure. Mild mitral valve regurgitation. No evidence of mitral stenosis.  5. The aortic valve has an indeterminant number of cusps. Aortic valve regurgitation is not visualized. Mild to moderate aortic valve sclerosis/calcification is present, without any evidence of aortic stenosis.  6. The inferior vena cava is normal in size with greater than 50% respiratory variability, suggesting right atrial pressure of 3 mmHg. FINDINGS  Left Ventricle: Left ventricular ejection fraction, by estimation, is 55 to 60%. The left ventricle has normal function. The left ventricle has no regional wall  motion abnormalities. The left ventricular internal cavity size was normal in size. There is  no left ventricular hypertrophy. Left ventricular diastolic parameters are indeterminate. Normal left ventricular filling pressure. Right Ventricle: The right ventricular size is normal. No increase in right ventricular wall thickness. Right ventricular systolic function is normal. Left Atrium: Left atrial size was mildly dilated. Right Atrium: Right atrial size was normal in size. Pericardium: There is no evidence of pericardial effusion. Mitral Valve: The mitral valve is normal in structure. Normal mobility of the mitral valve leaflets. Mild mitral valve regurgitation. No evidence of mitral valve stenosis. Tricuspid Valve: The tricuspid valve is normal in structure. Tricuspid valve regurgitation is trivial. No evidence of tricuspid stenosis. Aortic Valve: The aortic valve has an indeterminant number of cusps. . There is mild thickening and mild calcification of the aortic valve. Aortic valve  regurgitation is not visualized. Mild to moderate aortic valve sclerosis/calcification is present, without any evidence of aortic stenosis. There is mild thickening of the aortic valve. There is mild calcification of the aortic valve. Pulmonic Valve: The pulmonic valve was normal in structure. Pulmonic valve regurgitation is not visualized. No evidence of pulmonic stenosis. Aorta: The aortic root is normal in size and structure. Venous: The inferior vena cava is normal in size with greater than 50% respiratory variability, suggesting right atrial pressure of 3 mmHg. IAS/Shunts: No atrial level shunt detected by color flow Doppler.  LEFT VENTRICLE PLAX 2D LVIDd:         4.40 cm  Diastology LVIDs:         3.30 cm  LV e' lateral:   15.90 cm/s LV PW:         1.00 cm  LV E/e' lateral: 7.0 LV IVS:        0.90 cm  LV e' medial:    13.80 cm/s LVOT diam:     2.00 cm  LV E/e' medial:  8.1 LV SV:         73 LV SV Index:   38 LVOT Area:     3.14 cm  RIGHT VENTRICLE RV S prime:     10.80 cm/s TAPSE (M-mode): 1.7 cm LEFT ATRIUM             Index       RIGHT ATRIUM           Index LA diam:        4.20 cm 2.21 cm/m  RA Area:     17.00 cm LA Vol (A2C):   78.2 ml 41.10 ml/m RA Volume:   37.10 ml  19.50 ml/m LA Vol (A4C):   55.0 ml 28.91 ml/m LA Biplane Vol: 67.2 ml 35.32 ml/m  AORTIC VALVE LVOT Vmax:   132.00 cm/s LVOT Vmean:  77.600 cm/s LVOT VTI:    0.231 m  AORTA Ao Root diam: 3.60 cm MITRAL VALVE MV Area (PHT): 3.60 cm     SHUNTS MV Decel Time: 211 msec     Systemic VTI:  0.23 m MR Peak grad: 90.6 mmHg     Systemic Diam: 2.00 cm MR Mean grad: 58.0 mmHg MR Vmax:      476.00 cm/s MR Vmean:     350.0 cm/s MV E velocity: 112.00 cm/s MV A velocity: 60.20 cm/s MV E/A ratio:  1.86 Armanda Magic MD Electronically signed by Armanda Magic MD Signature Date/Time: 07/23/2019/3:42:48 PM    Final     Cardiac Studies   TTE 07/03/2019: Impressions: 1. Left ventricular ejection fraction, by  estimation, is 50 to 55%. The  left  ventricle has low normal function. The left ventricle has no regional  wall motion abnormalities. Left ventricular diastolic parameters are  indeterminate.  2. Right ventricular systolic function is normal. The right ventricular  size is normal.  3. Left atrial size was mild to moderately dilated.  4. The mitral valve is grossly normal. Mild mitral valve regurgitation.  5. The aortic valve is tricuspid. Aortic valve regurgitation is not  visualized.  6. The inferior vena cava is dilated in size with <50% respiratory  variability, suggesting right atrial pressure of 15 mmHg.   Conclusion(s)/Recommendation(s): No evidence of valvular vegetations on  this transthoracic echocardiogram. Would recommend a transesophageal  echocardiogram to exclude infective endocarditis if clinically indicated. _______________  TEE 07/04/2019: Impressions: 1. Left ventricular ejection fraction, by estimation, is 35 to 40%. The  left ventricle has moderately decreased function. The left ventricle  demonstrates global hypokinesis.  2. Right ventricular systolic function is normal. The right ventricular  size is normal.  3. No left atrial/left atrial appendage thrombus was detected.  4. The mitral valve is grossly normal. Mild mitral valve regurgitation.  No evidence of mitral stenosis.  5. The aortic valve is normal in structure. Aortic valve regurgitation is  not visualized. No aortic stenosis is present.  Echo 07/23/19 1. Left ventricular ejection fraction, by estimation, is 55 to 60%. The  left ventricle has normal function. The left ventricle has no regional  wall motion abnormalities. Left ventricular diastolic parameters are  indeterminate.  2. Right ventricular systolic function is normal. The right ventricular  size is normal.  3. Left atrial size was mildly dilated.  4. The mitral valve is normal in structure. Mild mitral valve  regurgitation. No evidence of mitral stenosis.  5.  The aortic valve has an indeterminant number of cusps. Aortic valve  regurgitation is not visualized. Mild to moderate aortic valve  sclerosis/calcification is present, without any evidence of aortic  stenosis.  6. The inferior vena cava is normal in size with greater than 50%  respiratory variability, suggesting right atrial pressure of 3 mmHg.   Patient Profile     79 y.o. male  with a history of persistent atrial flutter on Eliquis, chronic combined CHF, hypertension, and recent left lumbar L4-L5 microdiscectomy complicated by wound infection with sepsis and bacteremia who is being seen today for the evaluation of acute CHF.  Assessment & Plan    Acute on chronic combined CHF - presented with LLE and 20-30lbs weight gain - BNP 500s and CXR with B/L pleual effusions - TTE 5/27 showed LVEF 50-55% with normal wall motion. TEE 5/28 showed LVEF 35-40% with global hypokinesis - Echo this admission showed LVEF 55-60%, mildly dilated LA, mild MR - Repeat echo pending - IV lasix 40 mg BID - He put out 3.8Lunrine overnight, net -4.6L - weight down 9 lbs - creatinine stable - He is still volume up on exam. Continue with diuresis - Toprol 12.5 mg added but HR transiently down into upper 30s, patient was asymptomatic. Will hold BB  Persistent Aflutter - rate controlled - CHADSVASC = 4 (CHF, HTN, age x 2) - Eliquis 5 mg BID - Plan for TEE/DCCV 6/18 npo at midnight  HTN - IV lasix - pressures reasonable  For questions or updates, please contact [place] Please consult [email or web address] for contact info under        Signed, Leatta Alewine Ninfa Meeker, PA-C  07/24/2019, 10:16 AM

## 2019-07-24 NOTE — Anesthesia Preprocedure Evaluation (Addendum)
Anesthesia Evaluation  Patient identified by MRN, date of birth, ID band Patient awake    Reviewed: Allergy & Precautions, NPO status , Patient's Chart, lab work & pertinent test results  History of Anesthesia Complications Negative for: history of anesthetic complications  Airway Mallampati: II  TM Distance: >3 FB Neck ROM: Full    Dental no notable dental hx.    Pulmonary former smoker,    Pulmonary exam normal        Cardiovascular hypertension, +CHF  Normal cardiovascular exam+ dysrhythmias Atrial Fibrillation   TTE 06/22/19: EF 55-60%, mild LAE   Neuro/Psych negative neurological ROS  negative psych ROS   GI/Hepatic negative GI ROS, Neg liver ROS,   Endo/Other  negative endocrine ROS  Renal/GU negative Renal ROS  negative genitourinary   Musculoskeletal negative musculoskeletal ROS (+)   Abdominal   Peds  Hematology  (+) anemia , Hgb 8.6   Anesthesia Other Findings MSSA bacteremia in the setting of surgical wound infection/ventral abscess in 06/2019  Reproductive/Obstetrics negative OB ROS                            Anesthesia Physical Anesthesia Plan  ASA: III  Anesthesia Plan: MAC   Post-op Pain Management:    Induction:   PONV Risk Score and Plan: 1 and Treatment may vary due to age or medical condition and Propofol infusion  Airway Management Planned: Natural Airway and Nasal Cannula  Additional Equipment: None  Intra-op Plan:   Post-operative Plan:   Informed Consent: I have reviewed the patients History and Physical, chart, labs and discussed the procedure including the risks, benefits and alternatives for the proposed anesthesia with the patient or authorized representative who has indicated his/her understanding and acceptance.       Plan Discussed with: CRNA  Anesthesia Plan Comments:        Anesthesia Quick Evaluation

## 2019-07-24 NOTE — Progress Notes (Signed)
Referring Physician(s): Dr. Kathyrn Sheriff  Supervising Physician: Arne Cleveland  Patient Status:  [facility] - In-pt  Chief Complaint: Follow up left back SQ fluid collection drain placed 07/05/19 by Dr. Anselm Pancoast  Subjective:  Patient seen in his room, laying in bed comfortably watching TV. He reports that the drain is mainly cared for by his wife and she has been recording the output everyday at home. The drain is usually quite full when it's emptied each day but he is not sure of the exact amount, his wife will bring the drain card later today so we can review the output. He has not had any pain or issues flushing the drain that he is aware of. He is wondering when the drain can come out and is very motivated to go home ASAP even if that means coming back as an outpatient for the drain.  Allergies: Patient has no known allergies.  Medications: Prior to Admission medications   Medication Sig Start Date End Date Taking? Authorizing Provider  acetaminophen (TYLENOL) 325 MG tablet Take 650 mg by mouth every 6 (six) hours as needed for mild pain, moderate pain or headache.    Yes [provider]  ALPRAZolam Duanne Moron) 1 MG tablet Take 1 mg by mouth at bedtime.   Yes [provider]  apixaban (ELIQUIS) 5 MG TABS tablet Take 1 tablet (5 mg total) by mouth 2 (two) times daily. 07/10/19  Yes Costella, Vista Mink, PA-C  fluticasone (FLONASE) 50 MCG/ACT nasal spray Place 1 spray into both nostrils daily as needed for allergies or rhinitis.   Yes [provider]  furosemide (LASIX) 20 MG tablet Take 20 mg by mouth daily. 07/21/19 07/26/19 Yes [provider]  lisinopril (ZESTRIL) 10 MG tablet Take 10 mg by mouth at bedtime.    Yes [provider]  loratadine (CLARITIN) 10 MG tablet Take 10 mg by mouth daily as needed for allergies.   Yes [provider]  nafcillin IVPB Inject 12 g into the vein daily. As a continuous infusion via PICC line.Indication:  MSSA  bacteremia/discitis/ventral epidural abscess First Dose: Yes Last Day of Therapy:  08/17/2019 Labs - Twice weekly:  CBC/D and BMP, Labs - Every other week:  ESR and CRP Method of administration: Elastomeric (Continuous infusion) Method of administration may be changed at the discretion of home infusion pharmacist based upon assessment of the patient and/or caregiver's ability to self-administer the medication ordered. 07/10/19 08/19/19 Yes Costella, Vista Mink, PA-C  oxyCODONE-acetaminophen (PERCOCET) 7.5-325 MG tablet Take 0.5-1 tablets by mouth every 4 (four) hours as needed for severe pain.    Yes [provider]  PARoxetine (PAXIL) 30 MG tablet Take 30 mg by mouth every evening.   Yes [provider]  polyethylene glycol powder (GLYCOLAX/MIRALAX) 17 GM/SCOOP powder Take 17 g by mouth daily as needed for mild constipation.   Yes [provider]  Probiotic Product (PROBIOTIC DAILY) CAPS Take 1 capsule by mouth daily.   Yes [provider]  psyllium (METAMUCIL) 58.6 % packet Take 1 packet by mouth See admin instructions. Mix and drink 1 packet by mouth every other night   Yes [provider]  Turmeric 500 MG TABS Take 500 mg by mouth daily.   Yes [provider]  ibuprofen (ADVIL) 200 MG tablet Take 200 mg by mouth every 6 (six) hours as needed for mild pain or moderate pain. Patient not taking: Reported on 07/22/2019    [provider]  sildenafil (VIAGRA) 100  MG tablet Take 100 mg by mouth daily as needed for erectile dysfunction. Patient not taking: Reported on 07/22/2019    [provider]     Vital Signs: BP (!) 144/66 (BP Location: Right Arm)   Pulse 66   Temp (!) 97.5 F (36.4 C) (Oral)   Resp 19   Ht 5' 10"  (1.778 m)   Wt 150 lb 1.6 oz (68.1 kg)   SpO2 98%   BMI 21.54 kg/m   Physical Exam Vitals and nursing note reviewed.  Constitutional:      General: He is not in acute distress. HENT:     Head:  Normocephalic.  Cardiovascular:     Rate and Rhythm: Normal rate.  Pulmonary:     Effort: Pulmonary effort is normal.  Abdominal:     Palpations: Abdomen is soft.  Musculoskeletal:     Comments: (+) left lower back drain to suction with ~15 cc cloudy serosanguineous output. Flushes with some resistance, aspirates easily with return of some purulent material. No pain, leakage or bleeding from insertion site.   Skin:    General: Skin is warm and dry.  Neurological:     Mental Status: He is alert. Mental status is at baseline.  Psychiatric:        Mood and Affect: Mood normal.        Behavior: Behavior normal.        Thought Content: Thought content normal.        Judgment: Judgment normal.     Imaging: DG Chest 2 View  Result Date: 07/22/2019 CLINICAL DATA:  Lower extremity swelling. EXAM: CHEST - 2 VIEW COMPARISON:  Chest x-ray dated July 08, 2019. FINDINGS: Unchanged right upper extremity PICC line. The heart size and mediastinal contours are within normal limits. Normal pulmonary vascularity. Trace bilateral pleural effusions. No consolidation or pneumothorax. No acute osseous abnormality. IMPRESSION: 1. Trace bilateral pleural effusions. Electronically Signed   By: Titus Dubin M.D.   On: 07/22/2019 11:46   CT ABDOMEN PELVIS W CONTRAST  Result Date: 07/24/2019 CLINICAL DATA:  History of abscess within the subcutaneous tissues about the caudal aspect of the left flank following L4-L5 laminectomy, post ultrasound-guided placement of percutaneous catheter into the subcutaneous flank abscess on 07/05/2019. EXAM: CT ABDOMEN AND PELVIS WITH CONTRAST TECHNIQUE: Multidetector CT imaging of the abdomen and pelvis was performed using the standard protocol following bolus administration of intravenous contrast. CONTRAST:  157m OMNIPAQUE IOHEXOL 300 MG/ML  SOLN COMPARISON:  Ultrasound-guided subcutaneous drainage catheter placement-07/05/2019 Lumbar spine MRI-07/04/2019 FINDINGS: Lower chest:  Limited visualization of the lower thorax demonstrates small/trace bilateral effusions with associated bibasilar atelectasis, right greater than left. Note is made of a punctate (approximately 0.4 cm granuloma within the right lower lobe (image 8, series 5). Normal heart size. Coronary artery calcifications. No pericardial effusion. Hepatobiliary: Normal hepatic contour. There are multiple punctate subcentimeter hypoattenuating hepatic lesions which are too small to adequately characterize though favored to represent hepatic cysts. Note is made of multiple punctate calcified hepatic granulomas. There is a minimal amount of focal fatty infiltration adjacent to the fissure for the ligamentum teres. No discrete worrisome hepatic lesions. Normal appearance of the gallbladder given degree distention. No radiopaque gallstones. No intra or extrahepatic biliary ductal dilatation. No ascites. Pancreas: Normal appearance of the pancreas. Spleen: Multiple punctate granulomas are seen within otherwise normal-appearing spleen. Adrenals/Urinary Tract: Symmetric enhancement and excretion of the bilateral kidneys. No evidence of nephrolithiasis on this postcontrast examination. Note is made of an approximately 1.4  cm hypoattenuating nonenhancing left-sided renal cyst. No discrete right-sided renal lesions. No urinary obstruction or perinephric stranding. There is mild thickening of the left adrenal gland without discrete nodule. Normal appearance of the right adrenal gland. Normal appearance of the urinary bladder given degree distention. Stomach/Bowel: Extensive colonic diverticulosis, primarily involving the sigmoid colon, without evidence of superimposed acute diverticulitis. Moderate to large colonic stool burden without evidence of enteric obstruction. Normal appearance of the terminal ileum and the retrocecal appendix. No discrete areas of bowel wall thickening. No pneumoperitoneum, pneumatosis or portal venous gas.  Vascular/Lymphatic: Moderate amount of slightly irregular mixed noncalcified though predominantly calcified atherosclerotic plaque within a normal caliber abdominal aorta. The major branch vessels of the abdominal aorta appear patent on this non CTA examination. No bulky retroperitoneal, mesenteric, pelvic or inguinal lymphadenopathy. Reproductive: Prostatomegaly with mass effect on the undersurface of the urinary bladder. No free fluid the pelvic cul-de-sac. Other: Interval reduction/near resolution of subcutaneous fluid collection about the caudal aspect of the left lower back/flank with residual serpiginous collection measuring approximately 8.8 x 6.4 x 0.9 cm (sagittal image 118, series 7; axial image 43, series 6). Diffuse body wall anasarca, most conspicuous about the bilateral flanks and lateral thighs bilaterally. Musculoskeletal: Post left-sided L4 laminectomy (axial image 45, series 3), with suspected residual ventral epidural thickening extending from L2 through L4, suboptimally evaluated. Severe DDD of L2-L3 and L3-L4 with disc space height loss, endplate irregularity and small posteriorly directed disc osteophyte complex at L3-L4. Additionally, there is endplate lucency about the L2-L3 and L3-L4 intervertebral disc spaces, most conspicuous at L2-L3 (coronal image 96, series 7) which is nonspecific though could be seen in the setting of discitis/osteomyelitis as was suggested on lumbar spine MRI performed 07/04/2019 Mild degenerative change of the bilateral hips, left greater than right, with joint space loss, subchondral sclerosis and osteophytosis. IMPRESSION: 1. Post percutaneous drainage catheter placement with interval reduction/near resolution of serpiginous fluid collection within the subcutaneous tissues about the left lower back/flank. No new definable/drainable fluid collections. 2. Suspected ventral epidural thickening extending from L2-L4, incompletely evaluated on the present examination  though previously was worrisome for an epidural abscess. Additionally, there is severe DDD of L2-L3 and L3-L4, however given associated endplate lucency findings could be attributable to discitis/osteomyelitis (as was suggested on previous lumbar spine MRI). Further evaluation with repeat contrast-enhanced lumbar spine MRI as indicated. 3. Small/trace bilateral effusions with mild diffuse body wall anasarca, nonspecific though could be seen in the setting of congestive heart failure. Clinical correlation is advised. 4. Extensive colonic diverticulosis, primarily involving the sigmoid colon, without evidence of superimposed acute diverticulitis. 5. Large colonic stool burden without evidence of enteric obstruction. 6. Prostatomegaly with mass effect on the undersurface of the urinary bladder. If not recently performed, further evaluation with DRE is advised. 7. See quell a of previous granulomatous infection as above. 8.  Aortic Atherosclerosis (ICD10-I70.0). Electronically Signed   By: Sandi Mariscal M.D.   On: 07/24/2019 08:12   ECHOCARDIOGRAM COMPLETE  Result Date: 07/23/2019    ECHOCARDIOGRAM REPORT   Patient Name:   [facility] Date of Exam: 07/23/2019 Medical Rec #:  [id number]     Height:       70.0 in Accession #:    [id number]    Weight:       160.8 lb Date of Birth:  Dec 29, 1940     BSA:          1.902 m Patient Age:    79 years  BP:           145/76 mmHg Patient Gender: M             HR:           168 bpm. Exam Location:  Inpatient Procedure: 2D Echo Indications:    CHF-Acute Systolic 998.33 / A25.05  History:        Patient has prior history of Echocardiogram examinations, most                 recent 07/03/2019. Risk Factors:Hypertension.  Sonographer:    Mikki Santee RDCS (AE) Referring Phys: George  1. Left ventricular ejection fraction, by estimation, is 55 to 60%. The left ventricle has normal function. The left ventricle has no regional wall motion abnormalities.  Left ventricular diastolic parameters are indeterminate.  2. Right ventricular systolic function is normal. The right ventricular size is normal.  3. Left atrial size was mildly dilated.  4. The mitral valve is normal in structure. Mild mitral valve regurgitation. No evidence of mitral stenosis.  5. The aortic valve has an indeterminant number of cusps. Aortic valve regurgitation is not visualized. Mild to moderate aortic valve sclerosis/calcification is present, without any evidence of aortic stenosis.  6. The inferior vena cava is normal in size with greater than 50% respiratory variability, suggesting right atrial pressure of 3 mmHg. FINDINGS  Left Ventricle: Left ventricular ejection fraction, by estimation, is 55 to 60%. The left ventricle has normal function. The left ventricle has no regional wall motion abnormalities. The left ventricular internal cavity size was normal in size. There is  no left ventricular hypertrophy. Left ventricular diastolic parameters are indeterminate. Normal left ventricular filling pressure. Right Ventricle: The right ventricular size is normal. No increase in right ventricular wall thickness. Right ventricular systolic function is normal. Left Atrium: Left atrial size was mildly dilated. Right Atrium: Right atrial size was normal in size. Pericardium: There is no evidence of pericardial effusion. Mitral Valve: The mitral valve is normal in structure. Normal mobility of the mitral valve leaflets. Mild mitral valve regurgitation. No evidence of mitral valve stenosis. Tricuspid Valve: The tricuspid valve is normal in structure. Tricuspid valve regurgitation is trivial. No evidence of tricuspid stenosis. Aortic Valve: The aortic valve has an indeterminant number of cusps. . There is mild thickening and mild calcification of the aortic valve. Aortic valve regurgitation is not visualized. Mild to moderate aortic valve sclerosis/calcification is present, without any evidence of aortic  stenosis. There is mild thickening of the aortic valve. There is mild calcification of the aortic valve. Pulmonic Valve: The pulmonic valve was normal in structure. Pulmonic valve regurgitation is not visualized. No evidence of pulmonic stenosis. Aorta: The aortic root is normal in size and structure. Venous: The inferior vena cava is normal in size with greater than 50% respiratory variability, suggesting right atrial pressure of 3 mmHg. IAS/Shunts: No atrial level shunt detected by color flow Doppler.  LEFT VENTRICLE PLAX 2D LVIDd:         4.40 cm  Diastology LVIDs:         3.30 cm  LV e' lateral:   15.90 cm/s LV PW:         1.00 cm  LV E/e' lateral: 7.0 LV IVS:        0.90 cm  LV e' medial:    13.80 cm/s LVOT diam:     2.00 cm  LV E/e' medial:  8.1 LV SV:  73 LV SV Index:   38 LVOT Area:     3.14 cm  RIGHT VENTRICLE RV S prime:     10.80 cm/s TAPSE (M-mode): 1.7 cm LEFT ATRIUM             Index       RIGHT ATRIUM           Index LA diam:        4.20 cm 2.21 cm/m  RA Area:     17.00 cm LA Vol (A2C):   78.2 ml 41.10 ml/m RA Volume:   37.10 ml  19.50 ml/m LA Vol (A4C):   55.0 ml 28.91 ml/m LA Biplane Vol: 67.2 ml 35.32 ml/m  AORTIC VALVE LVOT Vmax:   132.00 cm/s LVOT Vmean:  77.600 cm/s LVOT VTI:    0.231 m  AORTA Ao Root diam: 3.60 cm MITRAL VALVE MV Area (PHT): 3.60 cm     SHUNTS MV Decel Time: 211 msec     Systemic VTI:  0.23 m MR Peak grad: 90.6 mmHg     Systemic Diam: 2.00 cm MR Mean grad: 58.0 mmHg MR Vmax:      476.00 cm/s MR Vmean:     350.0 cm/s MV E velocity: 112.00 cm/s MV A velocity: 60.20 cm/s MV E/A ratio:  1.86 Fransico Him MD Electronically signed by Fransico Him MD Signature Date/Time: 07/23/2019/3:42:48 PM    Final     Labs:  CBC: Recent Labs    07/06/19 0538 07/07/19 0137 07/08/19 0653 07/22/19 1122  WBC 11.5* 9.6 11.7* 6.4  HGB 9.3* 8.4* 10.0* 8.6*  HCT 28.3* 25.6* 31.5* 28.1*  PLT 292 271 335 247    COAGS: Recent Labs    06/30/19 1055  INR 1.1  APTT 27     BMP: Recent Labs    07/10/19 0610 07/22/19 1122 07/23/19 0643 07/24/19 0549  NA 134* 136 138 136  K 3.4* 4.2 3.5 3.4*  CL 101 102 98 97*  CO2 23 24 29 28   GLUCOSE 114* 201* 100* 110*  BUN 16 16 9 10   CALCIUM 7.7* 8.5* 8.9 8.7*  CREATININE 1.01 1.26* 1.12 1.09  GFRNONAA >60 54* >60 >60  GFRAA >60 >60 >60 >60    LIVER FUNCTION TESTS: Recent Labs    06/26/19 2020 06/30/19 1055 07/03/19 1159 07/22/19 1122  BILITOT 1.6* 1.1 1.1 0.8  AST 17 29 42* 13*  ALT 19 23 25  <5  ALKPHOS 49 76 102 76  PROT 6.5 5.3* 5.0* 5.9*  ALBUMIN 3.6 2.3* 1.9* 2.1*    Assessment and Plan:  79 y/o M s/p left lower back SQ abscess drain placed 07/05/19 who was planned to see IR yesterday as an outpatient however was admitted 6/15 for chest pain workup. IR was asked to evaluate the drain while he is here. CT abd/pelvis w/contrast was obtained yesterday which noted interval reduction/near resolution of fluid collection without new definable fluid collections. Patient history and imaging reviewed by Dr. Pascal Lux yesterday who recommends continuing to monitor output and once <10 cc/24H, excluding flush material, drain can be removed without further injection or imaging.  Per I/O 40 cc output in last 24H, on my exam today there is about 15 cc of cloudy serosanguineous output. Per patient they are emptying nearly a full drainage bulb most days at home, however his wife is bringing their drain record for my review. Drain flushes with some resistance and aspirates easily with return of some purulent material.   I have placed  orders for the drain to be flushed TID with 5 cc NS, record output Qshift, dressing changes QD, call IR with any concerns. I will follow up with patient tomorrow to review drain card from home.   He is ok for discharge with drain in place from IR perspective if output continues to be >10 cc/24H, he is very motivated to go home and is very willing to come back as an outpatient once the output  is <10 cc/24H so he can have his drain removed. He understands that we will continue to evaluate the drain while he is here.   Please call with any questions or concerns.   Electronically Signed: Joaquim Nam, PA-C 07/24/2019, 1:33 PM   I spent a total of 15 Minutes at the the patient's bedside AND on the patient's hospital floor or unit, greater than 50% of which was counseling/coordinating care for left SQ abscess drain follow up.

## 2019-07-24 NOTE — Progress Notes (Signed)
Progress Note  Patient Name: Avery Klingbeil Date of Encounter: 07/24/2019  [facility] Cardiologist: Jodelle Red, MD   Subjective   Patient put out 3.8L urine overnight. Swelling is improving. No chest pain. I was notified about heart rates in the 30-40, patient asymptomatic.   Inpatient Medications    Scheduled Meds: . ALPRAZolam  1 mg Oral QHS  . apixaban  5 mg Oral BID  . Chlorhexidine Gluconate Cloth  6 each Topical Daily  . furosemide  40 mg Intravenous Q12H  . metoprolol succinate  12.5 mg Oral Daily  . PARoxetine  30 mg Oral QPM  . sodium chloride flush  3 mL Intravenous Q12H   Continuous Infusions: . sodium chloride    .  ceFAZolin (ANCEF) IV 2 g (07/24/19 0811)   PRN Meds: sodium chloride, acetaminophen, fluticasone, loratadine, ondansetron (ZOFRAN) IV, oxyCODONE-acetaminophen, sodium chloride flush   Vital Signs    Vitals:   07/23/19 0724 07/23/19 1230 07/23/19 2038 07/24/19 0411  BP: 133/62 (!) 145/76 (!) 125/58 (!) 144/66  Pulse: 66 68 67 66  Resp: 16 18 19 19   Temp: 99.2 F (37.3 C) 99.1 F (37.3 C) 99.5 F (37.5 C) (!) 97.5 F (36.4 C)  TempSrc: Oral Oral Oral Oral  SpO2: 100% 99% 98% 98%  Weight:    68.1 kg  Height:        Intake/Output Summary (Last 24 hours) at 07/24/2019 1016 Last data filed at 07/24/2019 0600 Gross per 24 hour  Intake 720 ml  Output 3190 ml  Net -2470 ml   Last 3 Weights 07/24/2019 07/23/2019 07/22/2019  Weight (lbs) 150 lb 1.6 oz 160 lb 12.8 oz 163 lb 8 oz  Weight (kg) 68.085 kg 72.938 kg 74.163 kg      Telemetry    Aflutter with rates in the 50 and transiently down into the 30s - Personally Reviewed  ECG    pending - Personally Reviewed  Physical Exam   GEN: No acute distress.   Neck: No JVD Cardiac: Irreg Irreg, no murmurs, rubs, or gallops.  Respiratory: Clear to auscultation bilaterally. GI: Soft, nontender, non-distended  MS: 3+ edema; No deformity; erythema lower extremities Neuro:   Nonfocal  Psych: Normal affect   Labs    High Sensitivity Troponin:  No results for input(s): TROPONINIHS in the last 720 hours.    Chemistry Recent Labs  Lab 07/22/19 1122 07/23/19 0643 07/24/19 0549  NA 136 138 136  K 4.2 3.5 3.4*  CL 102 98 97*  CO2 24 29 28   GLUCOSE 201* 100* 110*  BUN 16 9 10   CREATININE 1.26* 1.12 1.09  CALCIUM 8.5* 8.9 8.7*  PROT 5.9*  --   --   ALBUMIN 2.1*  --   --   AST 13*  --   --   ALT <5  --   --   ALKPHOS 76  --   --   BILITOT 0.8  --   --   GFRNONAA 54* >60 >60  GFRAA >60 >60 >60  ANIONGAP 10 11 11      Hematology Recent Labs  Lab 07/22/19 1122  WBC 6.4  RBC 2.89*  HGB 8.6*  HCT 28.1*  MCV 97.2  MCH 29.8  MCHC 30.6  RDW 15.5  PLT 247    BNP Recent Labs  Lab 07/22/19 1711  BNP 559.2*     DDimer No results for input(s): DDIMER in the last 168 hours.   Radiology    DG Chest 2 View  Result Date: 07/22/2019 CLINICAL DATA:  Lower extremity swelling. EXAM: CHEST - 2 VIEW COMPARISON:  Chest x-ray dated July 08, 2019. FINDINGS: Unchanged right upper extremity PICC line. The heart size and mediastinal contours are within normal limits. Normal pulmonary vascularity. Trace bilateral pleural effusions. No consolidation or pneumothorax. No acute osseous abnormality. IMPRESSION: 1. Trace bilateral pleural effusions. Electronically Signed   By: Obie Dredge M.D.   On: 07/22/2019 11:46   CT ABDOMEN PELVIS W CONTRAST  Result Date: 07/24/2019 CLINICAL DATA:  History of abscess within the subcutaneous tissues about the caudal aspect of the left flank following L4-L5 laminectomy, post ultrasound-guided placement of percutaneous catheter into the subcutaneous flank abscess on 07/05/2019. EXAM: CT ABDOMEN AND PELVIS WITH CONTRAST TECHNIQUE: Multidetector CT imaging of the abdomen and pelvis was performed using the standard protocol following bolus administration of intravenous contrast. CONTRAST:  OMNIPAQUE IOHEXOL 300 MG/ML  SOLN  COMPARISON:  Ultrasound-guided subcutaneous drainage catheter placement-07/05/2019 Lumbar spine MRI-07/04/2019 FINDINGS: Lower chest: Limited visualization of the lower thorax demonstrates small/trace bilateral effusions with associated bibasilar atelectasis, right greater than left. Note is made of a punctate (approximately 0.4 cm granuloma within the right lower lobe (image 8, series 5). Normal heart size. Coronary artery calcifications. No pericardial effusion. Hepatobiliary: Normal hepatic contour. There are multiple punctate subcentimeter hypoattenuating hepatic lesions which are too small to adequately characterize though favored to represent hepatic cysts. Note is made of multiple punctate calcified hepatic granulomas. There is a minimal amount of focal fatty infiltration adjacent to the fissure for the ligamentum teres. No discrete worrisome hepatic lesions. Normal appearance of the gallbladder given degree distention. No radiopaque gallstones. No intra or extrahepatic biliary ductal dilatation. No ascites. Pancreas: Normal appearance of the pancreas. Spleen: Multiple punctate granulomas are seen within otherwise normal-appearing spleen. Adrenals/Urinary Tract: Symmetric enhancement and excretion of the bilateral kidneys. No evidence of nephrolithiasis on this postcontrast examination. Note is made of an approximately 1.4 cm hypoattenuating nonenhancing left-sided renal cyst. No discrete right-sided renal lesions. No urinary obstruction or perinephric stranding. There is mild thickening of the left adrenal gland without discrete nodule. Normal appearance of the right adrenal gland. Normal appearance of the urinary bladder given degree distention. Stomach/Bowel: Extensive colonic diverticulosis, primarily involving the sigmoid colon, without evidence of superimposed acute diverticulitis. Moderate to large colonic stool burden without evidence of enteric obstruction. Normal appearance of the terminal ileum and  the retrocecal appendix. No discrete areas of bowel wall thickening. No pneumoperitoneum, pneumatosis or portal venous gas. Vascular/Lymphatic: Moderate amount of slightly irregular mixed noncalcified though predominantly calcified atherosclerotic plaque within a normal caliber abdominal aorta. The major branch vessels of the abdominal aorta appear patent on this non CTA examination. No bulky retroperitoneal, mesenteric, pelvic or inguinal lymphadenopathy. Reproductive: Prostatomegaly with mass effect on the undersurface of the urinary bladder. No free fluid the pelvic cul-de-sac. Other: Interval reduction/near resolution of subcutaneous fluid collection about the caudal aspect of the left lower back/flank with residual serpiginous collection measuring approximately 8.8 x 6.4 x 0.9 cm (sagittal image 118, series 7; axial image 43, series 6). Diffuse body wall anasarca, most conspicuous about the bilateral flanks and lateral thighs bilaterally. Musculoskeletal: Post left-sided L4 laminectomy (axial image 45, series 3), with suspected residual ventral epidural thickening extending from L2 through L4, suboptimally evaluated. Severe DDD of L2-L3 and L3-L4 with disc space height loss, endplate irregularity and small posteriorly directed disc osteophyte complex at L3-L4. Additionally, there is endplate lucency about the L2-L3 and L3-L4 intervertebral disc spaces,  most conspicuous at L2-L3 (coronal image 96, series 7) which is nonspecific though could be seen in the setting of discitis/osteomyelitis as was suggested on lumbar spine MRI performed 07/04/2019 Mild degenerative change of the bilateral hips, left greater than right, with joint space loss, subchondral sclerosis and osteophytosis. IMPRESSION: 1. Post percutaneous drainage catheter placement with interval reduction/near resolution of serpiginous fluid collection within the subcutaneous tissues about the left lower back/flank. No new definable/drainable fluid  collections. 2. Suspected ventral epidural thickening extending from L2-L4, incompletely evaluated on the present examination though previously was worrisome for an epidural abscess. Additionally, there is severe DDD of L2-L3 and L3-L4, however given associated endplate lucency findings could be attributable to discitis/osteomyelitis (as was suggested on previous lumbar spine MRI). Further evaluation with repeat contrast-enhanced lumbar spine MRI as indicated. 3. Small/trace bilateral effusions with mild diffuse body wall anasarca, nonspecific though could be seen in the setting of congestive heart failure. Clinical correlation is advised. 4. Extensive colonic diverticulosis, primarily involving the sigmoid colon, without evidence of superimposed acute diverticulitis. 5. Large colonic stool burden without evidence of enteric obstruction. 6. Prostatomegaly with mass effect on the undersurface of the urinary bladder. If not recently performed, further evaluation with DRE is advised. 7. See quell a of previous granulomatous infection as above. 8.  Aortic Atherosclerosis (ICD10-I70.0). Electronically Signed   By: John  Watts M.D.   On: 07/24/2019 08:12   ECHOCARDIOGRAM COMPLETE  Result Date: 07/23/2019    ECHOCARDIOGRAM REPORT   Patient Name:   Armstead Durio Date of Exam: 07/23/2019 Medical Rec #:  [id number]     Height:       70.0 in Accession #:    [id number]    Weight:       160.8 lb Date of Birth:  11/17/1940     BSA:          1.902 m Patient Age:    79 years      BP:           145/76 mmHg Patient Gender: M             HR:           168 bpm. Exam Location:  Inpatient Procedure: 2D Echo Indications:    CHF-Acute Systolic 428.21 / I50.21  History:        Patient has prior history of Echocardiogram examinations, most                 recent 07/03/2019. Risk Factors:Hypertension.  Sonographer:    Casey Kirkpatrick RDCS (AE) Referring Phys: [id number] COSTIN M GHERGHE IMPRESSIONS  1. Left ventricular ejection fraction, by  estimation, is 55 to 60%. The left ventricle has normal function. The left ventricle has no regional wall motion abnormalities. Left ventricular diastolic parameters are indeterminate.  2. Right ventricular systolic function is normal. The right ventricular size is normal.  3. Left atrial size was mildly dilated.  4. The mitral valve is normal in structure. Mild mitral valve regurgitation. No evidence of mitral stenosis.  5. The aortic valve has an indeterminant number of cusps. Aortic valve regurgitation is not visualized. Mild to moderate aortic valve sclerosis/calcification is present, without any evidence of aortic stenosis.  6. The inferior vena cava is normal in size with greater than 50% respiratory variability, suggesting right atrial pressure of 3 mmHg. FINDINGS  Left Ventricle: Left ventricular ejection fraction, by estimation, is 55 to 60%. The left ventricle has normal function. The left ventricle has no regional wall   motion abnormalities. The left ventricular internal cavity size was normal in size. There is  no left ventricular hypertrophy. Left ventricular diastolic parameters are indeterminate. Normal left ventricular filling pressure. Right Ventricle: The right ventricular size is normal. No increase in right ventricular wall thickness. Right ventricular systolic function is normal. Left Atrium: Left atrial size was mildly dilated. Right Atrium: Right atrial size was normal in size. Pericardium: There is no evidence of pericardial effusion. Mitral Valve: The mitral valve is normal in structure. Normal mobility of the mitral valve leaflets. Mild mitral valve regurgitation. No evidence of mitral valve stenosis. Tricuspid Valve: The tricuspid valve is normal in structure. Tricuspid valve regurgitation is trivial. No evidence of tricuspid stenosis. Aortic Valve: The aortic valve has an indeterminant number of cusps. . There is mild thickening and mild calcification of the aortic valve. Aortic valve  regurgitation is not visualized. Mild to moderate aortic valve sclerosis/calcification is present, without any evidence of aortic stenosis. There is mild thickening of the aortic valve. There is mild calcification of the aortic valve. Pulmonic Valve: The pulmonic valve was normal in structure. Pulmonic valve regurgitation is not visualized. No evidence of pulmonic stenosis. Aorta: The aortic root is normal in size and structure. Venous: The inferior vena cava is normal in size with greater than 50% respiratory variability, suggesting right atrial pressure of 3 mmHg. IAS/Shunts: No atrial level shunt detected by color flow Doppler.  LEFT VENTRICLE PLAX 2D LVIDd:         4.40 cm  Diastology LVIDs:         3.30 cm  LV e' lateral:   15.90 cm/s LV PW:         1.00 cm  LV E/e' lateral: 7.0 LV IVS:        0.90 cm  LV e' medial:    13.80 cm/s LVOT diam:     2.00 cm  LV E/e' medial:  8.1 LV SV:         73 LV SV Index:   38 LVOT Area:     3.14 cm  RIGHT VENTRICLE RV S prime:     10.80 cm/s TAPSE (M-mode): 1.7 cm LEFT ATRIUM             Index       RIGHT ATRIUM           Index LA diam:        4.20 cm 2.21 cm/m  RA Area:     17.00 cm LA Vol (A2C):   78.2 ml 41.10 ml/m RA Volume:   37.10 ml  19.50 ml/m LA Vol (A4C):   55.0 ml 28.91 ml/m LA Biplane Vol: 67.2 ml 35.32 ml/m  AORTIC VALVE LVOT Vmax:   132.00 cm/s LVOT Vmean:  77.600 cm/s LVOT VTI:    0.231 m  AORTA Ao Root diam: 3.60 cm MITRAL VALVE MV Area (PHT): 3.60 cm     SHUNTS MV Decel Time: 211 msec     Systemic VTI:  0.23 m MR Peak grad: 90.6 mmHg     Systemic Diam: 2.00 cm MR Mean grad: 58.0 mmHg MR Vmax:      476.00 cm/s MR Vmean:     350.0 cm/s MV E velocity: 112.00 cm/s MV A velocity: 60.20 cm/s MV E/A ratio:  1.86 Armanda Magic MD Electronically signed by Armanda Magic MD Signature Date/Time: 07/23/2019/3:42:48 PM    Final     Cardiac Studies   TTE 07/03/2019: Impressions: 1. Left ventricular ejection fraction, by  estimation, is 50 to 55%. The  left  ventricle has low normal function. The left ventricle has no regional  wall motion abnormalities. Left ventricular diastolic parameters are  indeterminate.  2. Right ventricular systolic function is normal. The right ventricular  size is normal.  3. Left atrial size was mild to moderately dilated.  4. The mitral valve is grossly normal. Mild mitral valve regurgitation.  5. The aortic valve is tricuspid. Aortic valve regurgitation is not  visualized.  6. The inferior vena cava is dilated in size with <50% respiratory  variability, suggesting right atrial pressure of 15 mmHg.   Conclusion(s)/Recommendation(s): No evidence of valvular vegetations on  this transthoracic echocardiogram. Would recommend a transesophageal  echocardiogram to exclude infective endocarditis if clinically indicated. _______________  TEE 07/04/2019: Impressions: 1. Left ventricular ejection fraction, by estimation, is 35 to 40%. The  left ventricle has moderately decreased function. The left ventricle  demonstrates global hypokinesis.  2. Right ventricular systolic function is normal. The right ventricular  size is normal.  3. No left atrial/left atrial appendage thrombus was detected.  4. The mitral valve is grossly normal. Mild mitral valve regurgitation.  No evidence of mitral stenosis.  5. The aortic valve is normal in structure. Aortic valve regurgitation is  not visualized. No aortic stenosis is present.  Echo 07/23/19 1. Left ventricular ejection fraction, by estimation, is 55 to 60%. The  left ventricle has normal function. The left ventricle has no regional  wall motion abnormalities. Left ventricular diastolic parameters are  indeterminate.  2. Right ventricular systolic function is normal. The right ventricular  size is normal.  3. Left atrial size was mildly dilated.  4. The mitral valve is normal in structure. Mild mitral valve  regurgitation. No evidence of mitral stenosis.  5.  The aortic valve has an indeterminant number of cusps. Aortic valve  regurgitation is not visualized. Mild to moderate aortic valve  sclerosis/calcification is present, without any evidence of aortic  stenosis.  6. The inferior vena cava is normal in size with greater than 50%  respiratory variability, suggesting right atrial pressure of 3 mmHg.   Patient Profile     79 y.o. male  with a history of persistent atrial flutter on Eliquis, chronic combined CHF, hypertension, and recent left lumbar L4-L5 microdiscectomy complicated by wound infection with sepsis and bacteremia who is being seen today for the evaluation of acute CHF.  Assessment & Plan    Acute on chronic combined CHF - presented with LLE and 20-30lbs weight gain - BNP 500s and CXR with B/L pleual effusions - TTE 5/27 showed LVEF 50-55% with normal wall motion. TEE 5/28 showed LVEF 35-40% with global hypokinesis - Echo this admission showed LVEF 55-60%, mildly dilated LA, mild MR - Repeat echo pending - IV lasix 40 mg BID - He put out 3.8Lunrine overnight, net -4.6L - weight down 9 lbs - creatinine stable - He is still volume up on exam. Continue with diuresis - Toprol 12.5 mg added but HR transiently down into upper 30s, patient was asymptomatic. Will hold BB  Persistent Aflutter - rate controlled - CHADSVASC = 4 (CHF, HTN, age x 2) - Eliquis 5 mg BID - Plan for TEE/DCCV 6/18 npo at midnight  HTN - IV lasix - pressures reasonable  For questions or updates, please contact [place] Please consult [email or web address] for contact info under        Signed, Zyana Amaro Ninfa Meeker, PA-C  07/24/2019, 10:16 AM

## 2019-07-24 NOTE — Evaluation (Signed)
Physical Therapy Evaluation Patient Details Name: Neil Cordova MRN: [id number] DOB: 1940/04/20 Today's Date: 07/24/2019   History of Present Illness  Pt is a 79 y/o male admitted secondary to CHF exacerbation. Pt with recent lumbar surgery that resulted in infection and is s/p drain placement. PMH includes lumbar surgery and HTN.   Clinical Impression  Pt admitted secondary to problem above with deficits below. Pt requiring min to min guard A for gait without AD. Pt with LOB X3-4 requiring min A for steadying assist. Pt reports he has been using RW since previous admission and has been receiving HHPT. Recommend resumption of HH services at d/c. Will continue to follow acutely to maximize functional mobility independence and safety.     Follow Up Recommendations Home health PT (resume HHPT)    Equipment Recommendations  None recommended by PT    Recommendations for Other Services       Precautions / Restrictions Precautions Precautions: Fall;Back Precaution Booklet Issued: No Restrictions Weight Bearing Restrictions: No      Mobility  Bed Mobility Overal bed mobility: Needs Assistance Bed Mobility: Supine to Sit     Supine to sit: Supervision     General bed mobility comments: Supervision for safety.   Transfers Overall transfer level: Needs assistance Equipment used: None Transfers: Sit to/from Stand Sit to Stand: Min guard         General transfer comment: Min guard for safety.   Ambulation/Gait Ambulation/Gait assistance: Min assist;Min guard Gait Distance (Feet): 150 Feet Assistive device: None Gait Pattern/deviations: Step-through pattern;Decreased stride length;Staggering right;Drifts right/left     General Gait Details: Pt unsteady without use of AD. Pt with LOB X3-4 requiring min A for steadying. Otherwise requiring min guard. Pt reports normally using RW.   Stairs            Wheelchair Mobility    Modified Rankin (Stroke Patients Only)        Balance Overall balance assessment: Needs assistance Sitting-balance support: No upper extremity supported;Feet supported Sitting balance-Leahy Scale: Good     Standing balance support: No upper extremity supported;During functional activity Standing balance-Leahy Scale: Fair Standing balance comment: Able to perform static standing without UE support or LOB                              Pertinent Vitals/Pain Pain Assessment: Faces Faces Pain Scale: Hurts little more Pain Location: back Pain Descriptors / Indicators: Aching;Guarding Pain Intervention(s): Limited activity within patient's tolerance;Monitored during session;Repositioned    Home Living Family/patient expects to be discharged to:: Private residence Living Arrangements: Spouse/significant other Available Help at Discharge: Family Type of Home: House Home Access: Stairs to enter Entrance Stairs-Rails: None (no rail in garage; rail on R in front ) Secretary/administrator of Steps: 3 Home Layout: One level Home Equipment: Environmental consultant - 2 wheels;Grab bars - tub/shower      Prior Function Level of Independence: Independent with assistive device(s)         Comments: Has been using RW since surgery      Hand Dominance        Extremity/Trunk Assessment   Upper Extremity Assessment Upper Extremity Assessment: Overall WFL for tasks assessed    Lower Extremity Assessment Lower Extremity Assessment: Generalized weakness    Cervical / Trunk Assessment Cervical / Trunk Assessment: Other exceptions Cervical / Trunk Exceptions: recent lumbar surgery  Communication   Communication: HOH  Cognition Arousal/Alertness: Awake/alert Behavior During Therapy: [facility]  for tasks assessed/performed Overall Cognitive Status: Within Functional Limits for tasks assessed                                        General Comments      Exercises     Assessment/Plan    PT Assessment Patient needs  continued PT services  PT Problem List Decreased strength;Decreased balance;Decreased mobility;Pain       PT Treatment Interventions DME instruction;Gait training;Stair training;Functional mobility training;Therapeutic activities;Therapeutic exercise;Balance training;Patient/family education;Cognitive remediation    PT Goals (Current goals can be found in the Care Plan section)  Acute Rehab PT Goals Patient Stated Goal: to go home PT Goal Formulation: With patient Time For Goal Achievement: 08/07/19 Potential to Achieve Goals: Good    Frequency Min 3X/week   Barriers to discharge        Co-evaluation               AM-PAC PT "6 Clicks" Mobility  Outcome Measure Help needed turning from your back to your side while in a flat bed without using bedrails?: None Help needed moving from lying on your back to sitting on the side of a flat bed without using bedrails?: None Help needed moving to and from a bed to a chair (including a wheelchair)?: A Little Help needed standing up from a chair using your arms (e.g., wheelchair or bedside chair)?: A Little Help needed to walk in hospital room?: A Little Help needed climbing 3-5 steps with a railing? : A Lot 6 Click Score: 19    End of Session   Activity Tolerance: Patient tolerated treatment well Patient left: in bed;with call bell/phone within reach;with nursing/sitter in room (sitting EOB ) Nurse Communication: Mobility status PT Visit Diagnosis: Unsteadiness on feet (R26.81);Muscle weakness (generalized) (M62.81);Pain Pain - part of body:  (back)    Time: 8828-0034 PT Time Calculation (min) (ACUTE ONLY): 13 min   Charges:   PT Evaluation $PT Eval Low Complexity: 1 Low          Neil Cordova, DPT  Acute Rehabilitation Services  Pager: [phone] Office: [phone]   Neil Cordova 07/24/2019, 5:52 PM

## 2019-07-25 ENCOUNTER — Encounter (HOSPITAL_COMMUNITY): Payer: Self-pay | Admitting: Cardiovascular Disease

## 2019-07-25 ENCOUNTER — Inpatient Hospital Stay (HOSPITAL_COMMUNITY): Payer: Medicare Other

## 2019-07-25 ENCOUNTER — Inpatient Hospital Stay (HOSPITAL_COMMUNITY): Payer: Medicare Other | Admitting: Anesthesiology

## 2019-07-25 ENCOUNTER — Ambulatory Visit (HOSPITAL_COMMUNITY): Admit: 2019-07-25 | Payer: Medicare Other | Admitting: Cardiology

## 2019-07-25 ENCOUNTER — Encounter (HOSPITAL_COMMUNITY)
Admission: EM | Disposition: A | Discharge status: Home Health | Payer: Self-pay | Source: Home / Self Care | Attending: Internal Medicine

## 2019-07-25 DIAGNOSIS — I340 Nonrheumatic mitral (valve) insufficiency: Secondary | ICD-10-CM

## 2019-07-25 DIAGNOSIS — Q211 Atrial septal defect: Secondary | ICD-10-CM

## 2019-07-25 HISTORY — PX: TEE WITHOUT CARDIOVERSION: SHX5443

## 2019-07-25 HISTORY — PX: CARDIOVERSION: SHX1299

## 2019-07-25 LAB — CBC WITH DIFFERENTIAL/PLATELET
Abs Immature Granulocytes: 0.02 10*3/uL (ref 0.00–0.07)
Basophils Absolute: 0.1 10*3/uL (ref 0.0–0.1)
Basophils Relative: 1 %
Eosinophils Absolute: 0.3 10*3/uL (ref 0.0–0.5)
Eosinophils Relative: 4 %
HCT: 27.1 % — ABNORMAL LOW (ref 39.0–52.0)
Hemoglobin: 8.5 g/dL — ABNORMAL LOW (ref 13.0–17.0)
Immature Granulocytes: 0 %
Lymphocytes Relative: 25 %
Lymphs Abs: 1.4 10*3/uL (ref 0.7–4.0)
MCH: 30.1 pg (ref 26.0–34.0)
MCHC: 31.4 g/dL (ref 30.0–36.0)
MCV: 96.1 fL (ref 80.0–100.0)
Monocytes Absolute: 0.6 10*3/uL (ref 0.1–1.0)
Monocytes Relative: 11 %
Neutro Abs: 3.4 10*3/uL (ref 1.7–7.7)
Neutrophils Relative %: 59 %
Platelets: 270 10*3/uL (ref 150–400)
RBC: 2.82 MIL/uL — ABNORMAL LOW (ref 4.22–5.81)
RDW: 15.2 % (ref 11.5–15.5)
WBC: 5.9 10*3/uL (ref 4.0–10.5)
nRBC: 0 % (ref 0.0–0.2)

## 2019-07-25 LAB — PROTIME-INR
INR: 1.6 — ABNORMAL HIGH (ref 0.8–1.2)
Prothrombin Time: 18.8 seconds — ABNORMAL HIGH (ref 11.4–15.2)

## 2019-07-25 LAB — BASIC METABOLIC PANEL
Anion gap: 9 (ref 5–15)
BUN: 12 mg/dL (ref 8–23)
CO2: 32 mmol/L (ref 22–32)
Calcium: 8.7 mg/dL — ABNORMAL LOW (ref 8.9–10.3)
Chloride: 96 mmol/L — ABNORMAL LOW (ref 98–111)
Creatinine, Ser: 1.04 mg/dL (ref 0.61–1.24)
GFR calc Af Amer: >60 mL/min (ref >60)
GFR calc non Af Amer: >60 mL/min (ref >60)
Glucose, Bld: 101 mg/dL — ABNORMAL HIGH (ref 70–99)
Potassium: 3.8 mmol/L (ref 3.5–5.1)
Sodium: 137 mmol/L (ref 135–145)

## 2019-07-25 LAB — MAGNESIUM: Magnesium: 1.7 mg/dL (ref 1.7–2.4)

## 2019-07-25 LAB — FERRITIN: Ferritin: 131 ng/mL (ref 24–336)

## 2019-07-25 LAB — PHOSPHORUS: Phosphorus: 3.3 mg/dL (ref 2.5–4.6)

## 2019-07-25 SURGERY — ECHOCARDIOGRAM, TRANSESOPHAGEAL
Anesthesia: Monitor Anesthesia Care

## 2019-07-25 MED ORDER — SUCCINYLCHOLINE CHLORIDE 20 MG/ML IJ SOLN
INTRAMUSCULAR | Status: DC | PRN
Start: 2019-07-25 — End: 2019-07-25
  Administered 2019-07-25: 80 mg via INTRAVENOUS

## 2019-07-25 MED ORDER — MAGNESIUM SULFATE 2 GM/50ML IV SOLN
2.0000 g | Freq: Once | INTRAVENOUS | Status: AC
  Administered 2019-07-25: 2 g via INTRAVENOUS
  Filled 2019-07-25: qty 50

## 2019-07-25 MED ORDER — PROPOFOL 10 MG/ML IV BOLUS
INTRAVENOUS | Status: DC | PRN
  Administered 2019-07-25: 100 mg via INTRAVENOUS

## 2019-07-25 MED ORDER — DEXAMETHASONE SODIUM PHOSPHATE 10 MG/ML IJ SOLN
INTRAMUSCULAR | Status: DC | PRN
Start: 2019-07-25 — End: 2019-07-25
  Administered 2019-07-25: 10 mg via INTRAVENOUS

## 2019-07-25 MED ORDER — PHENYLEPHRINE 40 MCG/ML (10ML) SYRINGE FOR IV PUSH (FOR BLOOD PRESSURE SUPPORT)
PREFILLED_SYRINGE | INTRAVENOUS | Status: DC | PRN
  Administered 2019-07-25: 40 ug via INTRAVENOUS

## 2019-07-25 MED ORDER — GLYCOPYRROLATE 0.2 MG/ML IJ SOLN
INTRAMUSCULAR | Status: DC | PRN
  Administered 2019-07-25: .2 mg via INTRAVENOUS

## 2019-07-25 MED ORDER — SODIUM CHLORIDE 0.9 % IV SOLN: INTRAVENOUS | Status: DC

## 2019-07-25 MED ORDER — LIDOCAINE HCL (CARDIAC) PF 100 MG/5ML IV SOSY
PREFILLED_SYRINGE | INTRAVENOUS | Status: DC | PRN
  Administered 2019-07-25: 50 mg via INTRAVENOUS

## 2019-07-25 MED ORDER — MENTHOL 3 MG MT LOZG
1.0000 | LOZENGE | OROMUCOSAL | Status: DC | PRN
  Filled 2019-07-25: qty 9

## 2019-07-25 MED ORDER — PROMETHAZINE HCL 25 MG/ML IJ SOLN: 6.2500 mg | INTRAMUSCULAR | Status: DC | PRN

## 2019-07-25 MED ORDER — POTASSIUM CHLORIDE CRYS ER 20 MEQ PO TBCR
30.0000 meq | EXTENDED_RELEASE_TABLET | Freq: Once | ORAL | Status: AC
  Administered 2019-07-25: 30 meq via ORAL
  Filled 2019-07-25: qty 1

## 2019-07-25 MED ORDER — SODIUM CHLORIDE 0.9 % IV SOLN
INTRAVENOUS | Status: AC | PRN
  Administered 2019-07-25: 500 mL via INTRAVENOUS

## 2019-07-25 MED ORDER — ONDANSETRON HCL 4 MG/2ML IJ SOLN
INTRAMUSCULAR | Status: DC | PRN
Start: 2019-07-25 — End: 2019-07-25
  Administered 2019-07-25: 4 mg via INTRAVENOUS

## 2019-07-25 MED ORDER — PROPOFOL 500 MG/50ML IV EMUL
INTRAVENOUS | Status: DC | PRN
  Administered 2019-07-25: 100 ug/kg/min via INTRAVENOUS

## 2019-07-25 NOTE — Interval H&P Note (Signed)
History and Physical Interval Note:  07/25/2019 7:21 AM  Neil Cordova  has presented today for surgery, with the diagnosis of Flutter.  The various methods of treatment have been discussed with the patient and family. After consideration of risks, benefits and other options for treatment, the patient has consented to  Procedure(s): TRANSESOPHAGEAL ECHOCARDIOGRAM (TEE) (N/A) CARDIOVERSION (N/A) as a surgical intervention.  The patient's history has been reviewed, patient examined, no change in status, stable for surgery.  I have reviewed the patient's chart and labs.  Questions were answered to the patient's satisfaction.     Charlton Haws

## 2019-07-25 NOTE — Plan of Care (Signed)
  Problem: Health Behavior/Discharge Planning: Goal: Ability to manage health-related needs will improve Outcome: Progressing   Problem: Clinical Measurements: Goal: Ability to maintain clinical measurements within normal limits will improve Outcome: Progressing Goal: Will remain free from infection Outcome: Progressing   

## 2019-07-25 NOTE — Progress Notes (Signed)
Referring Physician(s): Dr. Kathyrn Sheriff  Supervising Physician: Aletta Edouard  Patient Status:  [facility] - In-pt  Chief Complaint: Follow up left back SQ fluid collection s/p drain placement 07/05/19 by Dr. Anselm Pancoast  Subjective:  Neil Cordova was seen in his room today, he is sitting up eating breakfast. He tells me his procedure this morning went very well and he is enjoying the care he is getting from everyone. His wife did not leave the drain card however she was able to text a picture of it today for my review - output has been as follows:  6/4 - 100 cc 6/5 - 100 cc 6/6 - 70 cc 6/7 - 75 cc 6/8 - 75 cc 6/9 - 40 cc 6/10 - 50 cc 6/11 - 25 cc 6/12 - 25 cc 6/12 - 25 cc 6/14 - 50 cc  Allergies: Patient has no known allergies.  Medications: Prior to Admission medications   Medication Sig Start Date End Date Taking? Authorizing Provider  acetaminophen (TYLENOL) 325 MG tablet Take 650 mg by mouth every 6 (six) hours as needed for mild pain, moderate pain or headache.    Yes [provider]  ALPRAZolam Duanne Moron) 1 MG tablet Take 1 mg by mouth at bedtime.   Yes [provider]  apixaban (ELIQUIS) 5 MG TABS tablet Take 1 tablet (5 mg total) by mouth 2 (two) times daily. 07/10/19  Yes Costella, Vista Mink, PA-C  fluticasone (FLONASE) 50 MCG/ACT nasal spray Place 1 spray into both nostrils daily as needed for allergies or rhinitis.   Yes [provider]  furosemide (LASIX) 20 MG tablet Take 20 mg by mouth daily. 07/21/19 07/26/19 Yes [provider]  lisinopril (ZESTRIL) 10 MG tablet Take 10 mg by mouth at bedtime.    Yes [provider]  loratadine (CLARITIN) 10 MG tablet Take 10 mg by mouth daily as needed for allergies.   Yes [provider]  nafcillin IVPB Inject 12 g into the vein daily. As a continuous infusion via PICC line.Indication:  MSSA bacteremia/discitis/ventral epidural abscess First Dose: Yes Last Day of Therapy:  08/17/2019 Labs  - Twice weekly:  CBC/D and BMP, Labs - Every other week:  ESR and CRP Method of administration: Elastomeric (Continuous infusion) Method of administration may be changed at the discretion of home infusion pharmacist based upon assessment of the patient and/or caregiver's ability to self-administer the medication ordered. 07/10/19 08/19/19 Yes Costella, Vista Mink, PA-C  oxyCODONE-acetaminophen (PERCOCET) 7.5-325 MG tablet Take 0.5-1 tablets by mouth every 4 (four) hours as needed for severe pain.    Yes [provider]  PARoxetine (PAXIL) 30 MG tablet Take 30 mg by mouth every evening.   Yes [provider]  polyethylene glycol powder (GLYCOLAX/MIRALAX) 17 GM/SCOOP powder Take 17 g by mouth daily as needed for mild constipation.   Yes [provider]  Probiotic Product (PROBIOTIC DAILY) CAPS Take 1 capsule by mouth daily.   Yes [provider]  psyllium (METAMUCIL) 58.6 % packet Take 1 packet by mouth See admin instructions. Mix and drink 1 packet by mouth every other night   Yes [provider]  Turmeric 500 MG TABS Take 500 mg by mouth daily.   Yes [provider]  ibuprofen (ADVIL) 200 MG tablet Take 200 mg by mouth every 6 (six) hours as needed for mild pain or moderate pain. Patient not taking: Reported on 07/22/2019    [provider]  sildenafil (VIAGRA) 100 MG tablet Take 100  mg by mouth daily as needed for erectile dysfunction. Patient not taking: Reported on 07/22/2019    [provider]     Vital Signs: BP (!) 116/47   Pulse 71   Temp (!) 96.8 F (36 C) (Temporal)   Resp 17   Ht 5' 10"  (1.778 m)   Wt 145 lb 12.8 oz (66.1 kg)   SpO2 99%   BMI 20.92 kg/m   Physical Exam Vitals and nursing note reviewed.  Constitutional:      General: He is not in acute distress. HENT:     Head: Normocephalic.  Cardiovascular:     Rate and Rhythm: Normal rate.  Pulmonary:     Effort: Pulmonary effort is normal.    Musculoskeletal:     Comments: (+) Left back SQ drain to suction with ~10 cc of cloudy serous fluid in bulb. Did not attempt to flush today. Insertion site unremarkable.  Skin:    General: Skin is warm and dry.  Neurological:     Mental Status: He is alert. Mental status is at baseline.     Imaging: DG Chest 2 View  Result Date: 07/22/2019 CLINICAL DATA:  Lower extremity swelling. EXAM: CHEST - 2 VIEW COMPARISON:  Chest x-ray dated July 08, 2019. FINDINGS: Unchanged right upper extremity PICC line. The heart size and mediastinal contours are within normal limits. Normal pulmonary vascularity. Trace bilateral pleural effusions. No consolidation or pneumothorax. No acute osseous abnormality. IMPRESSION: 1. Trace bilateral pleural effusions. Electronically Signed   By: Titus Dubin M.D.   On: 07/22/2019 11:46   CT ABDOMEN PELVIS W CONTRAST  Result Date: 07/24/2019 CLINICAL DATA:  History of abscess within the subcutaneous tissues about the caudal aspect of the left flank following L4-L5 laminectomy, post ultrasound-guided placement of percutaneous catheter into the subcutaneous flank abscess on 07/05/2019. EXAM: CT ABDOMEN AND PELVIS WITH CONTRAST TECHNIQUE: Multidetector CT imaging of the abdomen and pelvis was performed using the standard protocol following bolus administration of intravenous contrast. CONTRAST:  173m OMNIPAQUE IOHEXOL 300 MG/ML  SOLN COMPARISON:  Ultrasound-guided subcutaneous drainage catheter placement-07/05/2019 Lumbar spine MRI-07/04/2019 FINDINGS: Lower chest: Limited visualization of the lower thorax demonstrates small/trace bilateral effusions with associated bibasilar atelectasis, right greater than left. Note is made of a punctate (approximately 0.4 cm granuloma within the right lower lobe (image 8, series 5). Normal heart size. Coronary artery calcifications. No pericardial effusion. Hepatobiliary: Normal hepatic contour. There are multiple punctate subcentimeter  hypoattenuating hepatic lesions which are too small to adequately characterize though favored to represent hepatic cysts. Note is made of multiple punctate calcified hepatic granulomas. There is a minimal amount of focal fatty infiltration adjacent to the fissure for the ligamentum teres. No discrete worrisome hepatic lesions. Normal appearance of the gallbladder given degree distention. No radiopaque gallstones. No intra or extrahepatic biliary ductal dilatation. No ascites. Pancreas: Normal appearance of the pancreas. Spleen: Multiple punctate granulomas are seen within otherwise normal-appearing spleen. Adrenals/Urinary Tract: Symmetric enhancement and excretion of the bilateral kidneys. No evidence of nephrolithiasis on this postcontrast examination. Note is made of an approximately 1.4 cm hypoattenuating nonenhancing left-sided renal cyst. No discrete right-sided renal lesions. No urinary obstruction or perinephric stranding. There is mild thickening of the left adrenal gland without discrete nodule. Normal appearance of the right adrenal gland. Normal appearance of the urinary bladder given degree distention. Stomach/Bowel: Extensive colonic diverticulosis, primarily involving the sigmoid colon, without evidence of superimposed acute diverticulitis. Moderate to large colonic stool burden without evidence of enteric obstruction. Normal  appearance of the terminal ileum and the retrocecal appendix. No discrete areas of bowel wall thickening. No pneumoperitoneum, pneumatosis or portal venous gas. Vascular/Lymphatic: Moderate amount of slightly irregular mixed noncalcified though predominantly calcified atherosclerotic plaque within a normal caliber abdominal aorta. The major branch vessels of the abdominal aorta appear patent on this non CTA examination. No bulky retroperitoneal, mesenteric, pelvic or inguinal lymphadenopathy. Reproductive: Prostatomegaly with mass effect on the undersurface of the urinary  bladder. No free fluid the pelvic cul-de-sac. Other: Interval reduction/near resolution of subcutaneous fluid collection about the caudal aspect of the left lower back/flank with residual serpiginous collection measuring approximately 8.8 x 6.4 x 0.9 cm (sagittal image 118, series 7; axial image 43, series 6). Diffuse body wall anasarca, most conspicuous about the bilateral flanks and lateral thighs bilaterally. Musculoskeletal: Post left-sided L4 laminectomy (axial image 45, series 3), with suspected residual ventral epidural thickening extending from L2 through L4, suboptimally evaluated. Severe DDD of L2-L3 and L3-L4 with disc space height loss, endplate irregularity and small posteriorly directed disc osteophyte complex at L3-L4. Additionally, there is endplate lucency about the L2-L3 and L3-L4 intervertebral disc spaces, most conspicuous at L2-L3 (coronal image 96, series 7) which is nonspecific though could be seen in the setting of discitis/osteomyelitis as was suggested on lumbar spine MRI performed 07/04/2019 Mild degenerative change of the bilateral hips, left greater than right, with joint space loss, subchondral sclerosis and osteophytosis. IMPRESSION: 1. Post percutaneous drainage catheter placement with interval reduction/near resolution of serpiginous fluid collection within the subcutaneous tissues about the left lower back/flank. No new definable/drainable fluid collections. 2. Suspected ventral epidural thickening extending from L2-L4, incompletely evaluated on the present examination though previously was worrisome for an epidural abscess. Additionally, there is severe DDD of L2-L3 and L3-L4, however given associated endplate lucency findings could be attributable to discitis/osteomyelitis (as was suggested on previous lumbar spine MRI). Further evaluation with repeat contrast-enhanced lumbar spine MRI as indicated. 3. Small/trace bilateral effusions with mild diffuse body wall anasarca,  nonspecific though could be seen in the setting of congestive heart failure. Clinical correlation is advised. 4. Extensive colonic diverticulosis, primarily involving the sigmoid colon, without evidence of superimposed acute diverticulitis. 5. Large colonic stool burden without evidence of enteric obstruction. 6. Prostatomegaly with mass effect on the undersurface of the urinary bladder. If not recently performed, further evaluation with DRE is advised. 7. See quell a of previous granulomatous infection as above. 8.  Aortic Atherosclerosis (ICD10-I70.0). Electronically Signed   By: Sandi Mariscal M.D.   On: 07/24/2019 08:12   ECHOCARDIOGRAM COMPLETE  Result Date: 07/23/2019    ECHOCARDIOGRAM REPORT   Patient Name:   Neil Cordova Date of Exam: 07/23/2019 Medical Rec #:  [id number]     Height:       70.0 in Accession #:    [id number]    Weight:       160.8 lb Date of Birth:  [birth year]     BSA:          1.902 m Patient Age:    79 years      BP:           145/76 mmHg Patient Gender: M             HR:           168 bpm. Exam Location:  Inpatient Procedure: 2D Echo Indications:    CHF-Acute Systolic 341.93 / X90.24  History:        Patient has prior history  of Echocardiogram examinations, most                 recent 07/03/2019. Risk Factors:Hypertension.  Sonographer:    Mikki Santee RDCS (AE) Referring Phys: [place]  1. Left ventricular ejection fraction, by estimation, is 55 to 60%. The left ventricle has normal function. The left ventricle has no regional wall motion abnormalities. Left ventricular diastolic parameters are indeterminate.  2. Right ventricular systolic function is normal. The right ventricular size is normal.  3. Left atrial size was mildly dilated.  4. The mitral valve is normal in structure. Mild mitral valve regurgitation. No evidence of mitral stenosis.  5. The aortic valve has an indeterminant number of cusps. Aortic valve regurgitation is not visualized. Mild to  moderate aortic valve sclerosis/calcification is present, without any evidence of aortic stenosis.  6. The inferior vena cava is normal in size with greater than 50% respiratory variability, suggesting right atrial pressure of 3 mmHg. FINDINGS  Left Ventricle: Left ventricular ejection fraction, by estimation, is 55 to 60%. The left ventricle has normal function. The left ventricle has no regional wall motion abnormalities. The left ventricular internal cavity size was normal in size. There is  no left ventricular hypertrophy. Left ventricular diastolic parameters are indeterminate. Normal left ventricular filling pressure. Right Ventricle: The right ventricular size is normal. No increase in right ventricular wall thickness. Right ventricular systolic function is normal. Left Atrium: Left atrial size was mildly dilated. Right Atrium: Right atrial size was normal in size. Pericardium: There is no evidence of pericardial effusion. Mitral Valve: The mitral valve is normal in structure. Normal mobility of the mitral valve leaflets. Mild mitral valve regurgitation. No evidence of mitral valve stenosis. Tricuspid Valve: The tricuspid valve is normal in structure. Tricuspid valve regurgitation is trivial. No evidence of tricuspid stenosis. Aortic Valve: The aortic valve has an indeterminant number of cusps. . There is mild thickening and mild calcification of the aortic valve. Aortic valve regurgitation is not visualized. Mild to moderate aortic valve sclerosis/calcification is present, without any evidence of aortic stenosis. There is mild thickening of the aortic valve. There is mild calcification of the aortic valve. Pulmonic Valve: The pulmonic valve was normal in structure. Pulmonic valve regurgitation is not visualized. No evidence of pulmonic stenosis. Aorta: The aortic root is normal in size and structure. Venous: The inferior vena cava is normal in size with greater than 50% respiratory variability, suggesting  right atrial pressure of 3 mmHg. IAS/Shunts: No atrial level shunt detected by color flow Doppler.  LEFT VENTRICLE PLAX 2D LVIDd:         4.40 cm  Diastology LVIDs:         3.30 cm  LV e' lateral:   15.90 cm/s LV PW:         1.00 cm  LV E/e' lateral: 7.0 LV IVS:        0.90 cm  LV e' medial:    13.80 cm/s LVOT diam:     2.00 cm  LV E/e' medial:  8.1 LV SV:         73 LV SV Index:   38 LVOT Area:     3.14 cm  RIGHT VENTRICLE RV S prime:     10.80 cm/s TAPSE (M-mode): 1.7 cm LEFT ATRIUM             Index       RIGHT ATRIUM  Index LA diam:        4.20 cm 2.21 cm/m  RA Area:     17.00 cm LA Vol (A2C):   78.2 ml 41.10 ml/m RA Volume:   37.10 ml  19.50 ml/m LA Vol (A4C):   55.0 ml 28.91 ml/m LA Biplane Vol: 67.2 ml 35.32 ml/m  AORTIC VALVE LVOT Vmax:   132.00 cm/s LVOT Vmean:  77.600 cm/s LVOT VTI:    0.231 m  AORTA Ao Root diam: 3.60 cm MITRAL VALVE MV Area (PHT): 3.60 cm     SHUNTS MV Decel Time: 211 msec     Systemic VTI:  0.23 m MR Peak grad: 90.6 mmHg     Systemic Diam: 2.00 cm MR Mean grad: 58.0 mmHg MR Vmax:      476.00 cm/s MR Vmean:     350.0 cm/s MV E velocity: 112.00 cm/s MV A velocity: 60.20 cm/s MV E/A ratio:  1.86 Fransico Him MD Electronically signed by Fransico Him MD Signature Date/Time: 07/23/2019/3:42:48 PM    Final    ECHO TEE  Result Date: 07/25/2019    TRANSESOPHOGEAL ECHO REPORT   Patient Name:   KWALI WRINKLE Date of Exam: 07/25/2019 Medical Rec #:  [id number]     Height:       70.0 in Accession #:    [id number]    Weight:       145.8 lb Date of Birth:  10/28/1940     BSA:          1.825 m Patient Age:    59 years      BP:           141/51 mmHg Patient Gender: M             HR:           64 bpm. Exam Location:  Inpatient Procedure: 3D Echo, Transesophageal Echo and Color Doppler Indications:     I48.92* Unspecified atrial flutter  History:         Patient has prior history of Echocardiogram examinations, most                  recent 07/23/2019. CHF, Abnormal ECG, Arrythmias:Atrial  Flutter;                  Signs/Symptoms:Altered Mental Status and Bacteremia.  Sonographer:     Roseanna Rainbow RDCS Referring Phys:  [id number] Darreld Mclean Diagnosing Phys: Jenkins Rouge MD PROCEDURE: After discussion of the risks and benefits of a TEE, an informed consent was obtained from the patient. The patient was intubated. Anesthesia passed using glid scope after patient intubated. Imaged were obtained with the patient in a left lateral decubitus position. Sedation performed by different physician. The patient was monitored while under deep sedation. Anesthestetic sedation was provided intravenously by Anesthesiology: 294m of Propofol. Image quality was excellent. The patient developed no complications and Patient intubated electively during the procedure. A successful direct current cardioversion was performed. IMPRESSIONS  1. Anesthesia/Cardiology unable to pass scope with propofol Patient electively intubated and Dr HDaiva Hugepassed probe using glide scope and direct visualization.  2. DPanorax 1 150J patient converted to NSR from flutter at rate of 64 bpm.  3. Left ventricular ejection fraction, by estimation, is 55 to 60%. The left ventricle has normal function. The left ventricle has no regional wall motion abnormalities. Left ventricular diastolic function could not be evaluated.  4. Right ventricular systolic function is normal. The right ventricular size is  normal.  5. Left atrial size was moderately dilated. No left atrial/left atrial appendage thrombus was detected.  6. Right atrial size was moderately dilated.  7. The mitral valve is normal in structure. Mild mitral valve regurgitation.  8. The aortic valve is tricuspid. Aortic valve regurgitation is not visualized. No aortic stenosis is present.  9. PFO present. There is a small patent foramen ovale with predominantly left to right shunting across the atrial septum. FINDINGS  Left Ventricle: Left ventricular ejection fraction, by estimation, is 55 to  60%. The left ventricle has normal function. The left ventricle has no regional wall motion abnormalities. The left ventricular internal cavity size was normal in size. There is  no left ventricular hypertrophy. Left ventricular diastolic function could not be evaluated. Right Ventricle: The right ventricular size is normal. Right vetricular wall thickness was not assessed. Right ventricular systolic function is normal. Left Atrium: Left atrial size was moderately dilated. No left atrial/left atrial appendage thrombus was detected. Right Atrium: Right atrial size was moderately dilated. Pericardium: There is no evidence of pericardial effusion. Mitral Valve: The mitral valve is normal in structure. Mild mitral valve regurgitation. Tricuspid Valve: The tricuspid valve is normal in structure. Tricuspid valve regurgitation is mild. Aortic Valve: The aortic valve is tricuspid. Aortic valve regurgitation is not visualized. No aortic stenosis is present. Pulmonic Valve: The pulmonic valve was normal in structure. Pulmonic valve regurgitation is trivial. Aorta: The aortic root is normal in size and structure. IAS/Shunts: There is redundancy of the interatrial septum. PFO present. A small patent foramen ovale is detected with predominantly left to right shunting across the atrial septum. Additional Comments: Anesthesia/Cardiology unable to pass scope with propofol Patient electively intubated and Dr Daiva Huge passed probe using glide scope and direct visualization. [place] x 1 150J patient converted to NSR from flutter at rate of 64 bpm. Jenkins Rouge MD Electronically signed by Jenkins Rouge MD Signature Date/Time: 07/25/2019/10:06:24 AM    Final     Labs:  CBC: Recent Labs    07/07/19 0137 07/08/19 0653 07/22/19 1122 07/25/19 0430  WBC 9.6 11.7* 6.4 5.9  HGB 8.4* 10.0* 8.6* 8.5*  HCT 25.6* 31.5* 28.1* 27.1*  PLT 271 335 247 270    COAGS: Recent Labs    06/30/19 1055 07/25/19 1029  INR 1.1 1.6*  APTT 27  --       BMP: Recent Labs    07/22/19 1122 07/23/19 0643 07/24/19 0549 07/25/19 0430  NA 136 138 136 137  K 4.2 3.5 3.4* 3.8  CL 102 98 97* 96*  CO2 24 29 28  32  GLUCOSE 201* 100* 110* 101*  BUN 16 9 10 12   CALCIUM 8.5* 8.9 8.7* 8.7*  CREATININE 1.26* 1.12 1.09 1.04  GFRNONAA 54* >60 >60 >60  GFRAA >60 >60 >60 >60    LIVER FUNCTION TESTS: Recent Labs    06/26/19 2020 06/30/19 1055 07/03/19 1159 07/22/19 1122  BILITOT 1.6* 1.1 1.1 0.8  AST 17 29 42* 13*  ALT 19 23 25  <5  ALKPHOS 49 76 102 76  PROT 6.5 5.3* 5.0* 5.9*  ALBUMIN 3.6 2.3* 1.9* 2.1*    Assessment and Plan:  79 y/o M s/p left lower back SQ abscess drain placed 07/05/19 who was planned to see IR yesterday as an outpatient however was admitted 6/15 for chest pain workup. IR was asked to evaluate the drain while he is here. CT abd/pelvis w/contrast was obtained yesterday which noted interval reduction/near resolution of fluid collection without  new definable fluid collections. Patient history and imaging reviewed by Dr. Pascal Lux yesterday who recommends continuing to monitor output and once <10 cc/24H, excluding flush material, drain can be removed without further injection or imaging.  Per I/O only 5 cc output has been recorded however there is a note in the chart from night RN that 15 cc of drainage was emptied. On my exam this morning there is at least 10 cc of cloudy serous output. Per drain card he has had 25-100 cc QD prior to this admission.  Recommend continuing TID flushes with 5 cc NS, record output under I/O section Qshift, dressing changes QD, call IR with any concerns. He is ok to discharge from IR perspective as output continues to be >10 cc at this point. We discussed that if he is discharged prior to drain removal he can call our office once output <10 cc in a day and we will set him up to have the drain removed. I have placed our contact information in his discharge summary.  IR will continue to follow while  in house - please call with any questions or concerns.  Electronically Signed: Joaquim Nam, PA-C 07/25/2019, 11:02 AM   I spent a total of 15 Minutes at the the patient's bedside AND on the patient's hospital floor or unit, greater than 50% of which was counseling/coordinating care for left back SQ abscess drain follow up.

## 2019-07-25 NOTE — Progress Notes (Signed)
Physical Therapy Treatment Patient Details Name: Neil Cordova MRN: [id number] DOB: 1940/07/25 Today's Date: 07/25/2019    History of Present Illness Pt is a 79 y/o male admitted secondary to CHF exacerbation. Pt with recent lumbar surgery that resulted in infection and is s/p drain placement. Pt s/p TEE and cardioversion 6/18.  PMH includes lumbar surgery and HTN.    PT Comments    Pt making good progress.  Able to ambulate with and without RW safely without LOB.  Continue to recommend RW for ambulation with family.  Pt requiring min cues for posture and back precautions with transfers and cues for correct exercise form.  All VSS. Cont POC   Follow Up Recommendations  Home health PT (resume HHPT)     Equipment Recommendations  None recommended by PT    Recommendations for Other Services       Precautions / Restrictions Precautions Precautions: Fall;Back    Mobility  Bed Mobility Overal bed mobility: Needs Assistance Bed Mobility: Sidelying to Sit;Sit to Sidelying   Sidelying to sit: Supervision     Sit to sidelying: Supervision General bed mobility comments: Supervision for safety.   Transfers   Equipment used: None Transfers: Sit to/from Stand Sit to Stand: Supervision            Ambulation/Gait Ambulation/Gait assistance: Supervision Gait Distance (Feet): 300 Feet Assistive device: Rolling walker (2 wheeled);None Gait Pattern/deviations: Step-through pattern     General Gait Details: 250' with RW and 34' without RW; cued for posture ; no LOB   Stairs             Wheelchair Mobility    Modified Rankin (Stroke Patients Only)       Balance Overall balance assessment: Needs assistance Sitting-balance support: No upper extremity supported;Feet supported Sitting balance-Leahy Scale: Normal     Standing balance support: No upper extremity supported;During functional activity Standing balance-Leahy Scale: Good                               Cognition Arousal/Alertness: Awake/alert Behavior During Therapy: WFL for tasks assessed/performed Overall Cognitive Status: Within Functional Limits for tasks assessed                                        Exercises General Exercises - Lower Extremity Hip ABduction/ADduction: AROM;Both;10 reps;Standing (UE on counter) Hip Flexion/Marching: AROM;Both;10 reps;Standing (UE on counter; cues for full ROM slow/controlled) Toe Raises: AROM;Both;10 reps;Standing (UE on counter) Heel Raises: AROM;Both;10 reps;Standing (UE on counter) Mini-Sqauts: AROM;10 reps (Cues for correct posture/form - switched to sit to stand to improve form)    General Comments        Pertinent Vitals/Pain Pain Assessment: No/denies pain    Home Living                      Prior Function            PT Goals (current goals can now be found in the care plan section) Acute Rehab PT Goals Patient Stated Goal: to go home PT Goal Formulation: With patient Time For Goal Achievement: 08/07/19 Potential to Achieve Goals: Good Progress towards PT goals: Progressing toward goals    Frequency    Min 3X/week      PT Plan Current plan remains appropriate    Co-evaluation  AM-PAC PT "6 Clicks" Mobility   Outcome Measure  Help needed turning from your back to your side while in a flat bed without using bedrails?: None Help needed moving from lying on your back to sitting on the side of a flat bed without using bedrails?: None Help needed moving to and from a bed to a chair (including a wheelchair)?: None Help needed standing up from a chair using your arms (e.g., wheelchair or bedside chair)?: None Help needed to walk in hospital room?: None Help needed climbing 3-5 steps with a railing? : A Little 6 Click Score: 23    End of Session   Activity Tolerance: Patient tolerated treatment well Patient left: in bed;with call bell/phone within reach;with  family/visitor present Nurse Communication: Mobility status PT Visit Diagnosis: Unsteadiness on feet (R26.81);Muscle weakness (generalized) (M62.81);Pain     Time: 3546-5681 PT Time Calculation (min) (ACUTE ONLY): 20 min  Charges:  $Gait Training: 8-22 mins                     Abran Richard, PT Acute Rehab Services Pager [phone] [facility] [place] 07/25/2019, 5:43 PM

## 2019-07-25 NOTE — Progress Notes (Signed)
ekg completed

## 2019-07-25 NOTE — Anesthesia Postprocedure Evaluation (Signed)
Anesthesia Post Note  Patient: Neil Cordova  Procedure(s) Performed: TRANSESOPHAGEAL ECHOCARDIOGRAM (TEE) (N/A ) CARDIOVERSION (N/A )     Patient location during evaluation: PACU Anesthesia Type: General Level of consciousness: awake and alert and oriented Pain management: pain level controlled Vital Signs Assessment: post-procedure vital signs reviewed and stable Respiratory status: spontaneous breathing, nonlabored ventilation and respiratory function stable Cardiovascular status: blood pressure returned to baseline Postop Assessment: no apparent nausea or vomiting Anesthetic complications: no   No complications documented.  Last Vitals:  Vitals:   07/25/19 0850 07/25/19 0855  BP: (!) 113/53 (!) 116/47  Pulse: 69 71  Resp: 20 17  Temp:    SpO2: 100% 99%    Last Pain:  Vitals:   07/25/19 0855  TempSrc:   PainSc: 0-No pain                 Kaylyn Layer

## 2019-07-25 NOTE — Progress Notes (Signed)
°  Echocardiogram Echocardiogram Transesophageal has been performed.  Neil Cordova 07/25/2019, 8:43 AM

## 2019-07-25 NOTE — Op Note (Signed)
TEE/DCC: Anesthesia:  Marylouise Stacks  Propofol/General with Intubation  Unable to pass probe with aneesthesia Glide scope used to pass probe  EF 55% Moderate bi atrial enlargement PFO Mild MR Normal RV No LAA thrombus No effusion   DCC x 1 150 J converted from aflutter to NSR rate 65 bpm No immediate neurologic sequelae Extubated without difficulty Needs morning dose of eliquis given  Charlton Haws MD [facility]

## 2019-07-25 NOTE — Anesthesia Procedure Notes (Signed)
Procedure Name: Intubation Date/Time: 07/25/2019 7:58 AM Performed by: Marena Chancy, CRNA Pre-anesthesia Checklist: Patient identified, Emergency Drugs available, Suction available and Patient being monitored Patient Re-evaluated:Patient Re-evaluated prior to induction Oxygen Delivery Method: Circle System Utilized Preoxygenation: Pre-oxygenation with 100% oxygen Induction Type: IV induction Ventilation: Mask ventilation without difficulty Laryngoscope Size: Miller and 2 Grade View: Grade II Tube type: Oral Tube size: 7.0 mm Number of attempts: 1 Airway Equipment and Method: Stylet and Oral airway Placement Confirmation: ETT inserted through vocal cords under direct vision,  positive ETCO2 and breath sounds checked- equal and bilateral Tube secured with: Tape Dental Injury: Teeth and Oropharynx as per pre-operative assessment

## 2019-07-25 NOTE — Transfer of Care (Signed)
Immediate Anesthesia Transfer of Care Note  Patient: Neil Cordova  Procedure(s) Performed: TRANSESOPHAGEAL ECHOCARDIOGRAM (TEE) (N/A ) CARDIOVERSION (N/A )  Patient Location: PACU  Anesthesia Type:MAC and General  Level of Consciousness: awake, alert  and oriented  Airway & Oxygen Therapy: Patient Spontanous Breathing and Patient connected to nasal cannula oxygen  Post-op Assessment: Report given to RN, Post -op Vital signs reviewed and stable and Patient moving all extremities X 4  Post vital signs: Reviewed and stable  Last Vitals:  Vitals Value Taken Time  BP 113/53 07/25/19 0848  Temp 36 C 07/25/19 0832  Pulse 73 07/25/19 0851  Resp 16 07/25/19 0851  SpO2 100 % 07/25/19 0851  Vitals shown include unvalidated device data.  Last Pain:  Vitals:   07/25/19 0832  TempSrc: Temporal  PainSc: 0-No pain      Patients Stated Pain Goal: 0 (83/72/90 2111)  Complications: No complications documented.

## 2019-07-25 NOTE — Progress Notes (Signed)
Progress Note  Patient Name: Neil Cordova Date of Encounter: 07/25/2019  [facility] Cardiologist: Jodelle Red, MD   Subjective   Seen post TEE-CV. Reviewed results. Mild throat soreness but otherwise doing well. LE edema slowly improving.  Inpatient Medications    Scheduled Meds: . ALPRAZolam  1 mg Oral QHS  . apixaban  5 mg Oral BID  . Chlorhexidine Gluconate Cloth  6 each Topical Daily  . furosemide  40 mg Intravenous Q12H  . PARoxetine  30 mg Oral QPM  . polyethylene glycol  17 g Oral Daily  . sodium chloride flush  3 mL Intravenous Q12H  . sodium chloride flush  5 mL Intracatheter Q8H   Continuous Infusions: . sodium chloride    .  ceFAZolin (ANCEF) IV 2 g (07/25/19 0925)   PRN Meds: sodium chloride, acetaminophen, fluticasone, loratadine, menthol-cetylpyridinium, ondansetron (ZOFRAN) IV, oxyCODONE-acetaminophen, sodium chloride flush   Vital Signs    Vitals:   07/25/19 0845 07/25/19 0850 07/25/19 0855 07/25/19 1109  BP: (!) 105/39 (!) 113/53 (!) 116/47 (!) 92/50  Pulse: 68 69 71 77  Resp: Temp:    97.6 F (36.4 C)  TempSrc:    Oral  SpO2: 100% 100% 99% 98%  Weight:      Height:        Intake/Output Summary (Last 24 hours) at 07/25/2019 1245 Last data filed at 07/25/2019 1032 Gross per 24 hour  Intake 985 ml  Output 1600 ml  Net -615 ml   Last 3 Weights 07/25/2019 07/24/2019 07/23/2019  Weight (lbs) 145 lb 12.8 oz 150 lb 1.6 oz 160 lb 12.8 oz  Weight (kg) 66.134 kg 68.085 kg 72.938 kg      Telemetry    NSR post cardioversion - Personally Reviewed  ECG    NSR - Personally Reviewed  Physical Exam   GEN: No acute distress.   Neck: No JVD sitting upright Cardiac: RRR, no murmurs, rubs, or gallops.  Respiratory: Clear to auscultation bilaterally. GI: Soft, nontender, non-distended  MS: bilateral LE edema, improved but still 1+ Neuro:  Nonfocal  Psych: Normal affect   Labs    High Sensitivity Troponin:  No results  for input(s): TROPONINIHS in the last 720 hours.    Chemistry Recent Labs  Lab 07/22/19 1122 07/22/19 1122 07/23/19 0643 07/24/19 0549 07/25/19 0430  NA 136   < > 138 136 137  K 4.2   < > 3.5 3.4* 3.8  CL 102   < > 98 97* 96*  CO2 24   < > 29 28 32  GLUCOSE 201*   < > 100* 110* 101*  BUN 16   < > CREATININE 1.26*   < > 1.12 1.09 1.04  CALCIUM 8.5*   < > 8.9 8.7* 8.7*  PROT 5.9*  --   --   --   --   ALBUMIN 2.1*  --   --   --   --   AST 13*  --   --   --   --   ALT <5  --   --   --   --   ALKPHOS 76  --   --   --   --   BILITOT 0.8  --   --   --   --   GFRNONAA 54*   < > >60 >60 >60  GFRAA >60   < > >60 >60 >60  ANIONGAP 10   < >  11 11 9    < > = values in this interval not displayed.     Hematology Recent Labs  Lab 07/22/19 1122 07/25/19 0430  WBC 6.4 5.9  RBC 2.89* 2.82*  HGB 8.6* 8.5*  HCT 28.1* 27.1*  MCV 97.2 96.1  MCH 29.8 30.1  MCHC 30.6 31.4  RDW 15.5 15.2  PLT 247 270    BNP Recent Labs  Lab 07/22/19 1711  BNP 559.2*     DDimer No results for input(s): DDIMER in the last 168 hours.   Radiology    CT ABDOMEN PELVIS W CONTRAST  Result Date: 07/24/2019 CLINICAL DATA:  History of abscess within the subcutaneous tissues about the caudal aspect of the left flank following L4-L5 laminectomy, post ultrasound-guided placement of percutaneous catheter into the subcutaneous flank abscess on 07/05/2019. EXAM: CT ABDOMEN AND PELVIS WITH CONTRAST TECHNIQUE: Multidetector CT imaging of the abdomen and pelvis was performed using the standard protocol following bolus administration of intravenous contrast. CONTRAST:  07/07/2019 OMNIPAQUE IOHEXOL 300 MG/ML  SOLN COMPARISON:  Ultrasound-guided subcutaneous drainage catheter placement-07/05/2019 Lumbar spine MRI-07/04/2019 FINDINGS: Lower chest: Limited visualization of the lower thorax demonstrates small/trace bilateral effusions with associated bibasilar atelectasis, right greater than left. Note is made of a  punctate (approximately 0.4 cm granuloma within the right lower lobe (image 8, series 5). Normal heart size. Coronary artery calcifications. No pericardial effusion. Hepatobiliary: Normal hepatic contour. There are multiple punctate subcentimeter hypoattenuating hepatic lesions which are too small to adequately characterize though favored to represent hepatic cysts. Note is made of multiple punctate calcified hepatic granulomas. There is a minimal amount of focal fatty infiltration adjacent to the fissure for the ligamentum teres. No discrete worrisome hepatic lesions. Normal appearance of the gallbladder given degree distention. No radiopaque gallstones. No intra or extrahepatic biliary ductal dilatation. No ascites. Pancreas: Normal appearance of the pancreas. Spleen: Multiple punctate granulomas are seen within otherwise normal-appearing spleen. Adrenals/Urinary Tract: Symmetric enhancement and excretion of the bilateral kidneys. No evidence of nephrolithiasis on this postcontrast examination. Note is made of an approximately 1.4 cm hypoattenuating nonenhancing left-sided renal cyst. No discrete right-sided renal lesions. No urinary obstruction or perinephric stranding. There is mild thickening of the left adrenal gland without discrete nodule. Normal appearance of the right adrenal gland. Normal appearance of the urinary bladder given degree distention. Stomach/Bowel: Extensive colonic diverticulosis, primarily involving the sigmoid colon, without evidence of superimposed acute diverticulitis. Moderate to large colonic stool burden without evidence of enteric obstruction. Normal appearance of the terminal ileum and the retrocecal appendix. No discrete areas of bowel wall thickening. No pneumoperitoneum, pneumatosis or portal venous gas. Vascular/Lymphatic: Moderate amount of slightly irregular mixed noncalcified though predominantly calcified atherosclerotic plaque within a normal caliber abdominal aorta. The  major branch vessels of the abdominal aorta appear patent on this non CTA examination. No bulky retroperitoneal, mesenteric, pelvic or inguinal lymphadenopathy. Reproductive: Prostatomegaly with mass effect on the undersurface of the urinary bladder. No free fluid the pelvic cul-de-sac. Other: Interval reduction/near resolution of subcutaneous fluid collection about the caudal aspect of the left lower back/flank with residual serpiginous collection measuring approximately 8.8 x 6.4 x 0.9 cm (sagittal image 118, series 7; axial image 43, series 6). Diffuse body wall anasarca, most conspicuous about the bilateral flanks and lateral thighs bilaterally. Musculoskeletal: Post left-sided L4 laminectomy (axial image 45, series 3), with suspected residual ventral epidural thickening extending from L2 through L4, suboptimally evaluated. Severe DDD of L2-L3 and L3-L4 with disc space height loss, endplate irregularity and  small posteriorly directed disc osteophyte complex at L3-L4. Additionally, there is endplate lucency about the L2-L3 and L3-L4 intervertebral disc spaces, most conspicuous at L2-L3 (coronal image 96, series 7) which is nonspecific though could be seen in the setting of discitis/osteomyelitis as was suggested on lumbar spine MRI performed 07/04/2019 Mild degenerative change of the bilateral hips, left greater than right, with joint space loss, subchondral sclerosis and osteophytosis. IMPRESSION: 1. Post percutaneous drainage catheter placement with interval reduction/near resolution of serpiginous fluid collection within the subcutaneous tissues about the left lower back/flank. No new definable/drainable fluid collections. 2. Suspected ventral epidural thickening extending from L2-L4, incompletely evaluated on the present examination though previously was worrisome for an epidural abscess. Additionally, there is severe DDD of L2-L3 and L3-L4, however given associated endplate lucency findings could be  attributable to discitis/osteomyelitis (as was suggested on previous lumbar spine MRI). Further evaluation with repeat contrast-enhanced lumbar spine MRI as indicated. 3. Small/trace bilateral effusions with mild diffuse body wall anasarca, nonspecific though could be seen in the setting of congestive heart failure. Clinical correlation is advised. 4. Extensive colonic diverticulosis, primarily involving the sigmoid colon, without evidence of superimposed acute diverticulitis. 5. Large colonic stool burden without evidence of enteric obstruction. 6. Prostatomegaly with mass effect on the undersurface of the urinary bladder. If not recently performed, further evaluation with DRE is advised. 7. See quell a of previous granulomatous infection as above. 8.  Aortic Atherosclerosis (ICD10-I70.0). Electronically Signed   By: Simonne Come M.D.   On: 07/24/2019 08:12   ECHOCARDIOGRAM COMPLETE  Result Date: 07/23/2019    ECHOCARDIOGRAM REPORT   Patient Name:   ALWIN LANIGAN Date of Exam: 07/23/2019 Medical Rec #:  [id number]     Height:       70.0 in Accession #:    [id number]    Weight:       160.8 lb Date of Birth:  [birth year]     BSA:          1.902 m Patient Age:    79 years      BP:           145/76 mmHg Patient Gender: M             HR:           168 bpm. Exam Location:  Inpatient Procedure: 2D Echo Indications:    CHF-Acute Systolic 428.21 / I50.21  History:        Patient has prior history of Echocardiogram examinations, most                 recent 07/03/2019. Risk Factors:Hypertension.  Sonographer:    Thurman Coyer RDCS (AE) Referring Phys: [id number] Daylene Katayama [facility] IMPRESSIONS  1. Left ventricular ejection fraction, by estimation, is 55 to 60%. The left ventricle has normal function. The left ventricle has no regional wall motion abnormalities. Left ventricular diastolic parameters are indeterminate.  2. Right ventricular systolic function is normal. The right ventricular size is normal.  3. Left atrial size was  mildly dilated.  4. The mitral valve is normal in structure. Mild mitral valve regurgitation. No evidence of mitral stenosis.  5. The aortic valve has an indeterminant number of cusps. Aortic valve regurgitation is not visualized. Mild to moderate aortic valve sclerosis/calcification is present, without any evidence of aortic stenosis.  6. The inferior vena cava is normal in size with greater than 50% respiratory variability, suggesting right atrial pressure of 3 mmHg. FINDINGS  Left Ventricle: Left ventricular  ejection fraction, by estimation, is 55 to 60%. The left ventricle has normal function. The left ventricle has no regional wall motion abnormalities. The left ventricular internal cavity size was normal in size. There is  no left ventricular hypertrophy. Left ventricular diastolic parameters are indeterminate. Normal left ventricular filling pressure. Right Ventricle: The right ventricular size is normal. No increase in right ventricular wall thickness. Right ventricular systolic function is normal. Left Atrium: Left atrial size was mildly dilated. Right Atrium: Right atrial size was normal in size. Pericardium: There is no evidence of pericardial effusion. Mitral Valve: The mitral valve is normal in structure. Normal mobility of the mitral valve leaflets. Mild mitral valve regurgitation. No evidence of mitral valve stenosis. Tricuspid Valve: The tricuspid valve is normal in structure. Tricuspid valve regurgitation is trivial. No evidence of tricuspid stenosis. Aortic Valve: The aortic valve has an indeterminant number of cusps. . There is mild thickening and mild calcification of the aortic valve. Aortic valve regurgitation is not visualized. Mild to moderate aortic valve sclerosis/calcification is present, without any evidence of aortic stenosis. There is mild thickening of the aortic valve. There is mild calcification of the aortic valve. Pulmonic Valve: The pulmonic valve was normal in structure. Pulmonic  valve regurgitation is not visualized. No evidence of pulmonic stenosis. Aorta: The aortic root is normal in size and structure. Venous: The inferior vena cava is normal in size with greater than 50% respiratory variability, suggesting right atrial pressure of 3 mmHg. IAS/Shunts: No atrial level shunt detected by color flow Doppler.  LEFT VENTRICLE PLAX 2D LVIDd:         4.40 cm  Diastology LVIDs:         3.30 cm  LV e' lateral:   15.90 cm/s LV PW:         1.00 cm  LV E/e' lateral: 7.0 LV IVS:        0.90 cm  LV e' medial:    13.80 cm/s LVOT diam:     2.00 cm  LV E/e' medial:  8.1 LV SV:         73 LV SV Index:   38 LVOT Area:     3.14 cm  RIGHT VENTRICLE RV S prime:     10.80 cm/s TAPSE (M-mode): 1.7 cm LEFT ATRIUM             Index       RIGHT ATRIUM           Index LA diam:        4.20 cm 2.21 cm/m  RA Area:     17.00 cm LA Vol (A2C):   78.2 ml 41.10 ml/m RA Volume:   37.10 ml  19.50 ml/m LA Vol (A4C):   55.0 ml 28.91 ml/m LA Biplane Vol: 67.2 ml 35.32 ml/m  AORTIC VALVE LVOT Vmax:   132.00 cm/s LVOT Vmean:  77.600 cm/s LVOT VTI:    0.231 m  AORTA Ao Root diam: 3.60 cm MITRAL VALVE MV Area (PHT): 3.60 cm     SHUNTS MV Decel Time: 211 msec     Systemic VTI:  0.23 m MR Peak grad: 90.6 mmHg     Systemic Diam: 2.00 cm MR Mean grad: 58.0 mmHg MR Vmax:      476.00 cm/s MR Vmean:     350.0 cm/s MV E velocity: 112.00 cm/s MV A velocity: 60.20 cm/s MV E/A ratio:  1.86 Fransico Him MD Electronically signed by Fransico Him MD Signature Date/Time: 07/23/2019/3:42:48 PM  Final    ECHO TEE  Result Date: 07/25/2019    TRANSESOPHOGEAL ECHO REPORT   Patient Name:   Naoma DienerJERRY Zukowski Date of Exam: 07/25/2019 Medical Rec #:  [id number]     Height:       70.0 in Accession #:    [id number]    Weight:       145.8 lb Date of Birth:  [birth year]     BSA:          1.825 m Patient Age:    79 years      BP:           141/51 mmHg Patient Gender: M             HR:           64 bpm. Exam Location:  Inpatient Procedure: 3D Echo,  Transesophageal Echo and Color Doppler Indications:     I48.92* Unspecified atrial flutter  History:         Patient has prior history of Echocardiogram examinations, most                  recent 07/23/2019. CHF, Abnormal ECG, Arrythmias:Atrial Flutter;                  Signs/Symptoms:Altered Mental Status and Bacteremia.  Sonographer:     Sheralyn Boatmanina West RDCS Referring Phys:  [id number] Corrin ParkerCALLIE E GOODRICH Diagnosing Phys: Charlton HawsPeter Nishan MD PROCEDURE: After discussion of the risks and benefits of a TEE, an informed consent was obtained from the patient. The patient was intubated. Anesthesia passed using glid scope after patient intubated. Imaged were obtained with the patient in a left lateral decubitus position. Sedation performed by different physician. The patient was monitored while under deep sedation. Anesthestetic sedation was provided intravenously by Anesthesiology: 275mg  of Propofol. Image quality was excellent. The patient developed no complications and Patient intubated electively during the procedure. A successful direct current cardioversion was performed. IMPRESSIONS  1. Anesthesia/Cardiology unable to pass scope with propofol Patient electively intubated and Dr Stephannie PetersHowze passed probe using glide scope and direct visualization.  2. DCC x 1 150J patient converted to NSR from flutter at rate of 64 bpm.  3. Left ventricular ejection fraction, by estimation, is 55 to 60%. The left ventricle has normal function. The left ventricle has no regional wall motion abnormalities. Left ventricular diastolic function could not be evaluated.  4. Right ventricular systolic function is normal. The right ventricular size is normal.  5. Left atrial size was moderately dilated. No left atrial/left atrial appendage thrombus was detected.  6. Right atrial size was moderately dilated.  7. The mitral valve is normal in structure. Mild mitral valve regurgitation.  8. The aortic valve is tricuspid. Aortic valve regurgitation is not visualized.  No aortic stenosis is present.  9. PFO present. There is a small patent foramen ovale with predominantly left to right shunting across the atrial septum. FINDINGS  Left Ventricle: Left ventricular ejection fraction, by estimation, is 55 to 60%. The left ventricle has normal function. The left ventricle has no regional wall motion abnormalities. The left ventricular internal cavity size was normal in size. There is  no left ventricular hypertrophy. Left ventricular diastolic function could not be evaluated. Right Ventricle: The right ventricular size is normal. Right vetricular wall thickness was not assessed. Right ventricular systolic function is normal. Left Atrium: Left atrial size was moderately dilated. No left atrial/left atrial appendage thrombus was detected. Right Atrium: Right atrial size was moderately dilated.  Pericardium: There is no evidence of pericardial effusion. Mitral Valve: The mitral valve is normal in structure. Mild mitral valve regurgitation. Tricuspid Valve: The tricuspid valve is normal in structure. Tricuspid valve regurgitation is mild. Aortic Valve: The aortic valve is tricuspid. Aortic valve regurgitation is not visualized. No aortic stenosis is present. Pulmonic Valve: The pulmonic valve was normal in structure. Pulmonic valve regurgitation is trivial. Aorta: The aortic root is normal in size and structure. IAS/Shunts: There is redundancy of the interatrial septum. PFO present. A small patent foramen ovale is detected with predominantly left to right shunting across the atrial septum. Additional Comments: Anesthesia/Cardiology unable to pass scope with propofol Patient electively intubated and Dr Stephannie Peters passed probe using glide scope and direct visualization. DCC x 1 150J patient converted to NSR from flutter at rate of 64 bpm. Charlton Haws MD Electronically signed by Charlton Haws MD Signature Date/Time: 07/25/2019/10:06:24 AM    Final     Cardiac Studies   TEE-CV personally  reviewed.  Patient Profile     79 y.o. male with a history of persistent atrial flutter on Eliquis,chronic combined systolic and diastolic heart failure, hypertension, and recent left lumbar L4-L5 microdiscectomy complicated by wound infection with sepsis and bacteremiawho is being seen for the evaluation and management of heart failure and atrial flutter.  Assessment & Plan    Acute on chronic combined systolic and diastolic heart failure - TTE 5/27 showed LVEF 50-55% with normal wall motion. TEE 5/28 showed LVEF 35-40% with global hypokinesis - Echo this admission showed LVEF 55-60%, mildly dilated LA, mild MR - IV lasix 40 mg BID; improving LE edema but not at baseline. Nearing transition to oral diuretics, hopefully aided by return to sinus rhythm -admission weight 74.2 kg, today 66.1 kg. Net negative 5L  Persistent Aflutter - CHADSVASC = 4 (CHF, HTN, age x 2) - Eliquis 5 mg BID - successful TEE-CV 6/18 -significant bradycardia to 30s on metoprolol  HTN - IV lasix on board, has been generally well controlled -holding lisinopril, restart if BP allows  For questions or updates, please contact [facility] Please consult [email or web address] for contact info under     Signed, Jodelle Red, MD  07/25/2019, 12:45 PM

## 2019-07-25 NOTE — Progress Notes (Signed)
PROGRESS NOTE  Neil Cordova  DOB: 06-Jan-1941  PCP: Patient, No Pcp Per KWI:[id number]  DOA: 07/22/2019  LOS: 3 days   Chief Complaint  Patient presents with  . Blood Infection  . Chest Pain   Brief narrative: Patient is a 79 y.o. male  who was physically very active and did not have any past medical issues until recent surgery.   5/10, patient underwent L4-L5 microdiscectomy on 06/16/2019 by Dr. Kathyrn Sheriff 5/24 - 6/3, patient was hospitalized with acute metabolic encephalopathy with delirium. He was found to have MSSA bacteremia in the setting of surgical wound infection/ventral abscess.  He underwent repeat surgery cultures from the abscess which grew MSSA as well.  He was sent home with intravenous antibiotics (nafcillin) via a PICC line for 6 weeks with end date 7/11. During that hospital stay he was also diagnosed with new onset a flutter, he was rate controlled on metoprolol.  He was placed on Eliquis by cardiology.  2D echo was done showed an EF of 50-55%, no WMA however subsequent TEE to look for endocarditis actually showed a low EF of 30-35% with moderate to severe LV dysfunction. Apparently at discharge, he was not sent on cardiac medications.  Post discharge, patient started having progressive lower extremity swelling from the waist down and gained about 30 pounds. His legs became painful and red.   Also, when he was discharged did not have any antibiotics at home for at least 36 hours and he was quite upset about that.    Patient was admitted under hospitalist service for CHF exacerbation. Cardiology consultation was obtained.  Subjective: Patient was seen and examined this morning.   Sitting up in bed.  Not in distress.  Not on supplemental oxygen.   Bilateral lower extremity swelling improving.   Patient underwent successful cardioversion this morning.  Remains in normal sinus rhythm.  Assessment/Plan: Acute on chronic systolic CHF -In last hospitalization, he was noted to  have EF 30 to 35% with moderate to severe LV dysfunction. -Bilateral pedal edema started and progressively worsened since last hospitalization.  Not on diuretics at home.  -Cardiology consult appreciated.  -Echocardiogram repeated on 6/16 shows improvement in EF to 55 to 60%. -Patient is currently on Toprol 12.5 mg daily and Lasix IV 40 mg daily.  -Stable blood pressure, renal function. -Continue daily weights, strict I's and O's, telemetry monitoring.  -Monitor blood pressure, renal function and electrolytes. -Potassium level low at 3.8 today  A flutter, rate controlled -EKG in the ED showed A flutter with 4-1 conduction.   -Cardiology has started the patient on metoprolol low-dose.   -Patient underwent successful TEE cardioversion today. -Eliquis has been resumed.  Recent MSSA bacteremia/MSSA abscess/postop complication -Follow prolonged course of IV Ancef at home planned for 6 weeks with end date 7/11. -He has a drain in place placed by IR on 5/29. -6/16, patient underwent CT scan of abdomen pelvis and evaluation by IR. -Patient continues to drain more than 10 mL of fluid today.  Patient will continue to monitor output at home.  Patient will follow with IR as an outpatient to get the tube pulled out once output is less than 10 mL.   Anemia of chronic disease -Patient denies any past history of blood loss, EGD or colonoscopy.   -Since surgery, patient's hemoglobin has been trending less than 10 mostly.   -Recent ferritin level 131 with low iron level and TIBC.   -Hemoglobin stable.  GI evaluation as an outpatient.  Hyperglycemia -Glucose  on the initial BMP is 201.  No history of DM.   -A1c 6.3.  Possible cellulitis -Both of his lower extremities are red associated with swelling.  Could be from CHF itself but also cannot rule out possibility of coexisting cellulitis.  Patient is already on IV Ancef.   -Redness seems improving.    Mobility: Encourage ambulation.  PT  evaluation Code Status:  Full code  DVT prophylaxis:apixaban (ELIQUIS) tablet 5 mg  Antimicrobials:  IV Ancef Fluid: None Diet: Cardiac diet  Consultants: Cardiology Family Communication:  Family not at bedside today.  Status is: Inpatient Remains inpatient appropriate because:IV treatments appropriate due to intensity of illness or inability to take PO and Inpatient level of care appropriate due to severity of illness   Dispo: The patient is from: Home              Anticipated d/c is to: Home              Anticipated d/c date is: 2 days              Patient currently is not medically stable to d/c.  Antimicrobials: Anti-infectives (From admission, onward)   Start     Dose/Rate Route Frequency Ordered Stop   07/23/19 0200  ceFAZolin (ANCEF) IVPB 2g/100 mL premix     Discontinue     2 g 200 mL/hr over 30 Minutes Intravenous Every 8 hours 07/22/19 1829     07/22/19 1800  nafcillin 12 g in dextrose 5 % 50 mL IVPB  Status:  Discontinued        12 g 100 mL/hr over 30 Minutes Intravenous Daily 07/22/19 1749 07/22/19 1829        Code Status: Full Code   Diet Order            Diet 2 gram sodium Room service appropriate? Yes; Fluid consistency: Thin  Diet effective now                 Infusions:  . sodium chloride    .  ceFAZolin (ANCEF) IV 2 g (07/25/19 0925)  . magnesium sulfate bolus IVPB      Scheduled Meds: . ALPRAZolam  1 mg Oral QHS  . apixaban  5 mg Oral BID  . Chlorhexidine Gluconate Cloth  6 each Topical Daily  . furosemide  40 mg Intravenous Q12H  . PARoxetine  30 mg Oral QPM  . polyethylene glycol  17 g Oral Daily  . potassium chloride  30 mEq Oral Once  . sodium chloride flush  3 mL Intravenous Q12H  . sodium chloride flush  5 mL Intracatheter Q8H    PRN meds: sodium chloride, acetaminophen, fluticasone, loratadine, menthol-cetylpyridinium, ondansetron (ZOFRAN) IV, oxyCODONE-acetaminophen, sodium chloride flush   Objective: Vitals:   07/25/19 0855  07/25/19 1109  BP: (!) 116/47 (!) 92/50  Pulse: 71 77  Resp: 17 18  Temp:  97.6 F (36.4 C)  SpO2: 99% 98%    Intake/Output Summary (Last 24 hours) at 07/25/2019 1541 Last data filed at 07/25/2019 1327 Gross per 24 hour  Intake 1105 ml  Output 1600 ml  Net -495 ml   Filed Weights   07/23/19 0350 07/24/19 0411 07/25/19 0431  Weight: 72.9 kg 68.1 kg 66.1 kg   Weight change: -1.95 kg Body mass index is 20.92 kg/m.   Physical Exam: General exam: Appears calm and comfortable.  Not in physical distress Skin: No rashes, lesions or ulcers. HEENT: Atraumatic, normocephalic, supple neck, no obvious  bleeding Lungs: Clear to auscultation bilaterally CVS: Regular rhythm, no murmur  GI/Abd soft, nontender, nondistended, bowel sound present CNS: Alert, awake, oriented x3 Psychiatry: Mood appropriate Extremities: Improving bilateral pedal edema 1+ bilaterally, bilateral lower extremity redness.  Data Review: I have personally reviewed the laboratory data and studies available.  Recent Labs  Lab 07/22/19 1122 07/25/19 0430  WBC 6.4 5.9  NEUTROABS 4.7 3.4  HGB 8.6* 8.5*  HCT 28.1* 27.1*  MCV 97.2 96.1  PLT 247 270   Recent Labs  Lab 07/22/19 1122 07/23/19 0643 07/24/19 0549 07/25/19 0430  NA 136 138 136 137  K 4.2 3.5 3.4* 3.8  CL 102 98 97* 96*  CO2 24 29 28  32  GLUCOSE 201* 100* 110* 101*  BUN 16 9 10 12   CREATININE 1.26* 1.12 1.09 1.04  CALCIUM 8.5* 8.9 8.7* 8.7*  MG  --   --   --  1.7  PHOS  --   --   --  3.3   Recent Labs    07/23/19 1530  HGBA1C 6.3*     Signed, , MD [facility] Pager: [phone] (Secure Chat preferred). 07/25/2019

## 2019-07-25 NOTE — TOC Progression Note (Signed)
Transition of Care [facility]) - Progression Note    Patient Details  Name: Neil Cordova MRN: [id number] Date of Birth: April 01, 1940  Transition of Care [facility]) CM/SW Contact  Cherrie Distance, [place] Advanced Care Supervisor Phone Number: [phone] 07/25/2019, 10:30 AM  Clinical Narrative:    Patient is active with [facility] for [facility] services as prior to admission for home IV antibiotic infusion and HHPT; at discharge please contact Elnita Maxwell with [facility] [phone]) to schedule [facility] visit for resumption of IV infusion. Several doses of IV abx are currently at the home.   Expected Discharge Plan: Home w Home Health Services Barriers to Discharge: No Barriers Identified  Expected Discharge Plan and Services Expected Discharge Plan: Home w Home Health Services In-house Referral: NA Discharge Planning Services: CM Consult Post Acute Care Choice: Hospice Living arrangements for the past 2 months: Single Family Home                   DME Agency: NA       HH Arranged:  (HHRN with [facility])           Social Determinants of Health (SDOH) Interventions Food Insecurity Interventions: Intervention Not Indicated Financial Strain Interventions: Intervention Not Indicated Housing Interventions: Intervention Not Indicated Physical Activity Interventions: Intervention Not Indicated Transportation Interventions: Intervention Not Indicated  Readmission Risk Interventions No flowsheet data found.

## 2019-07-26 LAB — BASIC METABOLIC PANEL
Anion gap: 7 (ref 5–15)
BUN: 18 mg/dL (ref 8–23)
CO2: 31 mmol/L (ref 22–32)
Calcium: 8.7 mg/dL — ABNORMAL LOW (ref 8.9–10.3)
Chloride: 96 mmol/L — ABNORMAL LOW (ref 98–111)
Creatinine, Ser: 1.25 mg/dL — ABNORMAL HIGH (ref 0.61–1.24)
GFR calc Af Amer: >60 mL/min (ref >60)
GFR calc non Af Amer: 55 mL/min — ABNORMAL LOW (ref >60)
Glucose, Bld: 158 mg/dL — ABNORMAL HIGH (ref 70–99)
Potassium: 4.3 mmol/L (ref 3.5–5.1)
Sodium: 134 mmol/L — ABNORMAL LOW (ref 135–145)

## 2019-07-26 MED ORDER — FUROSEMIDE 10 MG/ML IJ SOLN
40.0000 mg | Freq: Two times a day (BID) | INTRAMUSCULAR | Status: DC
  Administered 2019-07-26 – 2019-07-28 (×5): 40 mg via INTRAVENOUS
  Filled 2019-07-26 (×5): qty 4

## 2019-07-26 NOTE — Progress Notes (Signed)
PROGRESS NOTE  Neil Cordova  DOB: [birth year]  PCP: Patient, No Pcp Per QIH:[id number]  DOA: 07/22/2019  LOS: 4 days   Chief Complaint  Patient presents with  . Blood Infection  . Chest Pain   Brief narrative: Patient is a 79 y.o. male  who was physically very active and did not have any past medical issues until recent surgery.   5/10, patient underwent L4-L5 microdiscectomy on 06/16/2019 by Dr. Kathyrn Sheriff 5/24 - 6/3, patient was hospitalized with acute metabolic encephalopathy with delirium. He was found to have MSSA bacteremia in the setting of surgical wound infection/ventral abscess.  He underwent repeat surgery cultures from the abscess which grew MSSA as well.  He was sent home with intravenous antibiotics (nafcillin) via a PICC line for 6 weeks with end date 7/11. During that hospital stay he was also diagnosed with new onset a flutter, he was rate controlled on metoprolol.  He was placed on Eliquis by cardiology.  2D echo was done showed an EF of 50-55%, no WMA however subsequent TEE to look for endocarditis actually showed a low EF of 30-35% with moderate to severe LV dysfunction. Apparently at discharge, he was not sent on cardiac medications.  Post discharge, patient started having progressive lower extremity swelling from the waist down and gained about 30 pounds. His legs became painful and red.   Also, when he was discharged did not have any antibiotics at home for at least 36 hours and he was quite upset about that.    Patient was admitted under hospitalist service for CHF exacerbation. Cardiology consultation was obtained.  Subjective: Patient was seen and examined this morning.   Sitting up in bed.  Not in distress.  Not on supplemental oxygen.   Bilateral lower extremity swelling improving.   Patient underwent successful cardioversion this morning.  Remains in normal sinus rhythm.  Assessment/Plan: Acute on chronic systolic CHF -In last hospitalization, he was noted to  have EF 30 to 35% with moderate to severe LV dysfunction. -Bilateral pedal edema started and progressively worsened since last hospitalization.  Not on diuretics at home.  -Cardiology consult appreciated.  -Echocardiogram repeated on 6/16 shows improvement in EF to 55 to 60%. -Patient is currently on Toprol 12.5 mg daily and Lasix IV 40 mg daily.  -Stable blood pressure, renal function. -Patient continues to have bilateral pedal edema. -Continue daily weights, strict I's and O's, telemetry monitoring.  -Monitor blood pressure, renal function and electrolytes.  A flutter, rate controlled -EKG in the ED showed A flutter with 4-1 conduction.   -Cardiology has started the patient on metoprolol low-dose.   -Patient underwent successful TEE cardioversion on 07/25/2019. -Eliquis has been resumed.  Recent MSSA bacteremia/MSSA abscess/postop complication -Follow prolonged course of IV Ancef at home planned for 6 weeks with end date 7/11. -He has a drain in place placed by IR on 5/29. -6/16, patient underwent CT scan of abdomen pelvis and evaluation by IR. -Patient continues to drain more than 10 mL of fluid today.  Patient will continue to monitor output at home.  Patient will follow with IR as an outpatient to get the tube pulled out once output is less than 10 mL.   Anemia of chronic disease -Patient denies any past history of blood loss, EGD or colonoscopy.   -Since surgery, patient's hemoglobin has been trending less than 10 mostly.   -Recent ferritin level 131 with low iron level and TIBC.   -Hemoglobin stable between 8 and 9..  GI evaluation  as an outpatient.  Hyperglycemia -Glucose on the initial BMP is 201.  No history of DM.   -A1c 6.3.  Possible cellulitis -Both of his lower extremities are red associated with swelling.  Could be from CHF itself but also cannot rule out possibility of coexisting cellulitis.  Patient is already on IV Ancef.   -Redness seems improving.     Mobility: Encourage ambulation.  PT evaluation Code Status:  Full code  DVT prophylaxis:apixaban (ELIQUIS) tablet 5 mg  Antimicrobials:  IV Ancef Fluid: None Diet: Cardiac diet  Consultants: Cardiology Family Communication:  Family not at bedside today.  Status is: Inpatient Remains inpatient appropriate because:IV treatments appropriate due to intensity of illness or inability to take PO and Inpatient level of care appropriate due to severity of illness   Dispo: The patient is from: Home              Anticipated d/c is to: Home              Anticipated d/c date is: 2 days              Patient currently is not medically stable to d/c.  Antimicrobials: Anti-infectives (From admission, onward)   Start     Dose/Rate Route Frequency Ordered Stop   07/23/19 0200  ceFAZolin (ANCEF) IVPB 2g/100 mL premix     Discontinue     2 g 200 mL/hr over 30 Minutes Intravenous Every 8 hours 07/22/19 1829     07/22/19 1800  nafcillin 12 g in dextrose 5 % 50 mL IVPB  Status:  Discontinued        12 g 100 mL/hr over 30 Minutes Intravenous Daily 07/22/19 1749 07/22/19 1829        Code Status: Full Code   Diet Order            Diet 2 gram sodium Room service appropriate? Yes; Fluid consistency: Thin  Diet effective now                 Infusions:  . sodium chloride Stopped (07/26/19 0430)  .  ceFAZolin (ANCEF) IV 2 g (07/26/19 0843)    Scheduled Meds: . ALPRAZolam  1 mg Oral QHS  . apixaban  5 mg Oral BID  . Chlorhexidine Gluconate Cloth  6 each Topical Daily  . furosemide  40 mg Intravenous BID  . PARoxetine  30 mg Oral QPM  . polyethylene glycol  17 g Oral Daily  . sodium chloride flush  3 mL Intravenous Q12H  . sodium chloride flush  5 mL Intracatheter Q8H    PRN meds: sodium chloride, acetaminophen, fluticasone, loratadine, menthol-cetylpyridinium, ondansetron (ZOFRAN) IV, oxyCODONE-acetaminophen, sodium chloride flush   Objective: Vitals:   07/26/19 0833 07/26/19 1122   BP: 124/67 (!) 123/56  Pulse: 62 (!) 59  Resp:  20  Temp:  97.9 F (36.6 C)  SpO2: 99% 100%    Intake/Output Summary (Last 24 hours) at 07/26/2019 1340 Last data filed at 07/26/2019 1032 Gross per 24 hour  Intake 1767.67 ml  Output 2175 ml  Net -407.33 ml   Filed Weights   07/24/19 0411 07/25/19 0431 07/26/19 0204  Weight: 68.1 kg 66.1 kg 67.3 kg   Weight change: 1.179 kg Body mass index is 21.29 kg/m.   Physical Exam: General exam: Appears calm and comfortable.  Not in physical distress Skin: No rashes, lesions or ulcers. HEENT: Atraumatic, normocephalic, supple neck, no obvious bleeding Lungs: Clear to auscultation bilaterally CVS: Regular rhythm, no  murmur  GI/Abd soft, nontender, nondistended, bowel sound present CNS: Alert, awake, oriented x3 Psychiatry: Mood appropriate Extremities: Improving bilateral pedal edema 1+ bilaterally, bilateral lower extremity redness.  Data Review: I have personally reviewed the laboratory data and studies available.  Recent Labs  Lab 07/22/19 1122 07/25/19 0430  WBC 6.4 5.9  NEUTROABS 4.7 3.4  HGB 8.6* 8.5*  HCT 28.1* 27.1*  MCV 97.2 96.1  PLT 247 270   Recent Labs  Lab 07/22/19 1122 07/23/19 0643 07/24/19 0549 07/25/19 0430 07/26/19 0350  NA 136 138 136 137 134*  K 4.2 3.5 3.4* 3.8 4.3  CL 102 98 97* 96* 96*  CO2 24 29 28  32 31  GLUCOSE 201* 100* 110* 101* 158*  BUN 16 9 10 12 18   CREATININE 1.26* 1.12 1.09 1.04 1.25*  CALCIUM 8.5* 8.9 8.7* 8.7* 8.7*  MG  --   --   --  1.7  --   PHOS  --   --   --  3.3  --    Recent Labs    07/23/19 1530  HGBA1C 6.3*     Signed, , MD [facility] Pager: [phone] (Secure Chat preferred). 07/26/2019

## 2019-07-26 NOTE — Progress Notes (Signed)
Progress Note  Patient Name: Neil Cordova Date of Encounter: 07/26/2019  [facility] Cardiologist: Jodelle Red, MD   Subjective   Status post cardioversion yesterday.  Remains in sinus rhythm today.  Has been walking the halls without issue.  Inpatient Medications    Scheduled Meds: . ALPRAZolam  1 mg Oral QHS  . apixaban  5 mg Oral BID  . Chlorhexidine Gluconate Cloth  6 each Topical Daily  . furosemide  40 mg Intravenous BID  . PARoxetine  30 mg Oral QPM  . polyethylene glycol  17 g Oral Daily  . sodium chloride flush  3 mL Intravenous Q12H  . sodium chloride flush  5 mL Intracatheter Q8H   Continuous Infusions: . sodium chloride Stopped (07/26/19 0430)  .  ceFAZolin (ANCEF) IV 2 g (07/26/19 0843)   PRN Meds: sodium chloride, acetaminophen, fluticasone, loratadine, menthol-cetylpyridinium, ondansetron (ZOFRAN) IV, oxyCODONE-acetaminophen, sodium chloride flush   Vital Signs    Vitals:   07/26/19 0204 07/26/19 0343 07/26/19 0833 07/26/19 1122  BP:  120/64 124/67 (!) 123/56  Pulse:  64 62 (!) 59  Resp:  18  20  Temp:  97.8 F (36.6 C)  97.9 F (36.6 C)  TempSrc:  Oral  Oral  SpO2:  98% 99% 100%  Weight: 67.3 kg     Height:        Intake/Output Summary (Last 24 hours) at 07/26/2019 1216 Last data filed at 07/26/2019 1032 Gross per 24 hour  Intake 1887.67 ml  Output 2175 ml  Net -287.33 ml   Last 3 Weights 07/26/2019 07/25/2019 07/24/2019  Weight (lbs) 148 lb 6.4 oz 145 lb 12.8 oz 150 lb 1.6 oz  Weight (kg) 67.314 kg 66.134 kg 68.085 kg      Telemetry    Sinus rhythm- Personally Reviewed  ECG    Sinus rhythm- Personally Reviewed  Physical Exam   GEN: Well nourished, well developed, in no acute distress  HEENT: normal  Neck: no JVD, carotid bruits, or masses Cardiac: RRR; no murmurs, rubs, or gallops, 2+ edema  Respiratory:  clear to auscultation bilaterally, normal work of breathing GI: soft, nontender, nondistended, + BS MS: no  deformity or atrophy  Skin: warm and dry Neuro:  Strength and sensation are intact Psych: euthymic mood, full affect   Labs    High Sensitivity Troponin:  No results for input(s): TROPONINIHS in the last 720 hours.    Chemistry Recent Labs  Lab 07/22/19 1122 07/23/19 0643 07/24/19 0549 07/25/19 0430 07/26/19 0350  NA 136   < > 136 137 134*  K 4.2   < > 3.4* 3.8 4.3  CL 102   < > 97* 96* 96*  CO2 24   < > 28 32 31  GLUCOSE 201*   < > 110* 101* 158*  BUN 16   < > 10 12 18   CREATININE 1.26*   < > 1.09 1.04 1.25*  CALCIUM 8.5*   < > 8.7* 8.7* 8.7*  PROT 5.9*  --   --   --   --   ALBUMIN 2.1*  --   --   --   --   AST 13*  --   --   --   --   ALT <5  --   --   --   --   ALKPHOS 76  --   --   --   --   BILITOT 0.8  --   --   --   --  GFRNONAA 54*   < > >60 >60 55*  GFRAA >60   < > >60 >60 >60  ANIONGAP 10   < > 11 9 7    < > = values in this interval not displayed.     Hematology Recent Labs  Lab 07/22/19 1122 07/25/19 0430  WBC 6.4 5.9  RBC 2.89* 2.82*  HGB 8.6* 8.5*  HCT 28.1* 27.1*  MCV 97.2 96.1  MCH 29.8 30.1  MCHC 30.6 31.4  RDW 15.5 15.2  PLT 247 270    BNP Recent Labs  Lab 07/22/19 1711  BNP 559.2*     DDimer No results for input(s): DDIMER in the last 168 hours.   Radiology    ECHO TEE  Result Date: 07/25/2019    TRANSESOPHOGEAL ECHO REPORT   Patient Name:   Neil Cordova Date of Exam: 07/25/2019 Medical Rec #:  07/27/2019     Height:       70.0 in Accession #:    [id number]    Weight:       145.8 lb Date of Birth:  03-Mar-1940     BSA:          1.825 m Patient Age:    79 years      BP:           141/51 mmHg Patient Gender: M             HR:           64 bpm. Exam Location:  Inpatient Procedure: 3D Echo, Transesophageal Echo and Color Doppler Indications:     I48.92* Unspecified atrial flutter  History:         Patient has prior history of Echocardiogram examinations, most                  recent 07/23/2019. CHF, Abnormal ECG, Arrythmias:Atrial  Flutter;                  Signs/Symptoms:Altered Mental Status and Bacteremia.  Sonographer:     07/25/2019 RDCS Referring Phys:  Sheralyn Boatman [id number] Diagnosing Phys: Corrin Parker MD PROCEDURE: After discussion of the risks and benefits of a TEE, an informed consent was obtained from the patient. The patient was intubated. Anesthesia passed using glid scope after patient intubated. Imaged were obtained with the patient in a left lateral decubitus position. Sedation performed by different physician. The patient was monitored while under deep sedation. Anesthestetic sedation was provided intravenously by Anesthesiology: 275mg  of Propofol. Image quality was excellent. The patient developed no complications and Patient intubated electively during the procedure. A successful direct current cardioversion was performed. IMPRESSIONS  1. Anesthesia/Cardiology unable to pass scope with propofol Patient electively intubated and Dr Charlton Haws passed probe using glide scope and direct visualization.  2. DCC x 1 150J patient converted to NSR from flutter at rate of 64 bpm.  3. Left ventricular ejection fraction, by estimation, is 55 to 60%. The left ventricle has normal function. The left ventricle has no regional wall motion abnormalities. Left ventricular diastolic function could not be evaluated.  4. Right ventricular systolic function is normal. The right ventricular size is normal.  5. Left atrial size was moderately dilated. No left atrial/left atrial appendage thrombus was detected.  6. Right atrial size was moderately dilated.  7. The mitral valve is normal in structure. Mild mitral valve regurgitation.  8. The aortic valve is tricuspid. Aortic valve regurgitation is not visualized. No aortic stenosis is present.  9.  PFO present. There is a small patent foramen ovale with predominantly left to right shunting across the atrial septum. FINDINGS  Left Ventricle: Left ventricular ejection fraction, by estimation, is 55 to  60%. The left ventricle has normal function. The left ventricle has no regional wall motion abnormalities. The left ventricular internal cavity size was normal in size. There is  no left ventricular hypertrophy. Left ventricular diastolic function could not be evaluated. Right Ventricle: The right ventricular size is normal. Right vetricular wall thickness was not assessed. Right ventricular systolic function is normal. Left Atrium: Left atrial size was moderately dilated. No left atrial/left atrial appendage thrombus was detected. Right Atrium: Right atrial size was moderately dilated. Pericardium: There is no evidence of pericardial effusion. Mitral Valve: The mitral valve is normal in structure. Mild mitral valve regurgitation. Tricuspid Valve: The tricuspid valve is normal in structure. Tricuspid valve regurgitation is mild. Aortic Valve: The aortic valve is tricuspid. Aortic valve regurgitation is not visualized. No aortic stenosis is present. Pulmonic Valve: The pulmonic valve was normal in structure. Pulmonic valve regurgitation is trivial. Aorta: The aortic root is normal in size and structure. IAS/Shunts: There is redundancy of the interatrial septum. PFO present. A small patent foramen ovale is detected with predominantly left to right shunting across the atrial septum. Additional Comments: Anesthesia/Cardiology unable to pass scope with propofol Patient electively intubated and Dr Daiva Huge passed probe using glide scope and direct visualization. Tucker x 1 150J patient converted to NSR from flutter at rate of 64 bpm. Jenkins Rouge MD Electronically signed by Jenkins Rouge MD Signature Date/Time: 07/25/2019/10:06:24 AM    Final     Cardiac Studies   TEE-CV personally reviewed.  Patient Profile     78 y.o. male with a history of persistent atrial flutter on Eliquis,chronic combined systolic and diastolic heart failure, hypertension, and recent left lumbar L4-L5 microdiscectomy complicated by wound  infection with sepsis and bacteremiawho is being seen for the evaluation and management of heart failure and atrial flutter.  Assessment & Plan    Acute on chronic combined systolic and diastolic heart failure Echo shows an ejection fraction of 55 to 60%.  He is on IV Lasix with improvement in his lower extremity edema.  Despite this his edema is still present and, with his creatinine going up mildly, would continue with IV diuresis in the hospital.  Would likely be able to be due to be discharged within the next day or 2.   Persistent Aflutter CHA2DS2-VASc of 4.  Currently on Eliquis.  Status post TEE and cardioversion remaining in sinus rhythm.   HTN Currently well controlled  For questions or updates, please contact [facility] Please consult [email or web address] for contact info under     Signed, Herby Amick Meredith Leeds, MD  07/26/2019, 12:16 PM

## 2019-07-27 LAB — BASIC METABOLIC PANEL
Anion gap: 11 (ref 5–15)
BUN: 20 mg/dL (ref 8–23)
CO2: 30 mmol/L (ref 22–32)
Calcium: 9.1 mg/dL (ref 8.9–10.3)
Chloride: 96 mmol/L — ABNORMAL LOW (ref 98–111)
Creatinine, Ser: 1.13 mg/dL (ref 0.61–1.24)
GFR calc Af Amer: >60 mL/min (ref >60)
GFR calc non Af Amer: >60 mL/min (ref >60)
Glucose, Bld: 81 mg/dL (ref 70–99)
Potassium: 4.2 mmol/L (ref 3.5–5.1)
Sodium: 137 mmol/L (ref 135–145)

## 2019-07-27 NOTE — Progress Notes (Signed)
Progress Note  Patient Name: Neil Cordova Date of Encounter: 07/27/2019  [facility] Cardiologist: Jodelle Red, MD   Subjective   Remains in sinus rhythm.  Continue to walk the halls.  No complaints today.  Inpatient Medications    Scheduled Meds: . ALPRAZolam  1 mg Oral QHS  . apixaban  5 mg Oral BID  . Chlorhexidine Gluconate Cloth  6 each Topical Daily  . furosemide  40 mg Intravenous BID  . PARoxetine  30 mg Oral QPM  . polyethylene glycol  17 g Oral Daily  . sodium chloride flush  3 mL Intravenous Q12H  . sodium chloride flush  5 mL Intracatheter Q8H   Continuous Infusions: . sodium chloride 250 mL (07/27/19 0905)  .  ceFAZolin (ANCEF) IV 2 g (07/27/19 0906)   PRN Meds: sodium chloride, acetaminophen, fluticasone, loratadine, menthol-cetylpyridinium, ondansetron (ZOFRAN) IV, oxyCODONE-acetaminophen, sodium chloride flush   Vital Signs    Vitals:   07/26/19 2022 07/27/19 0427 07/27/19 0431 07/27/19 0851  BP: (!) 115/57  (!) 152/58 (!) 130/49  Pulse: (!) 59  64 73  Resp: 18  18   Temp: 98.3 F (36.8 C)  98.2 F (36.8 C) (!) 97.4 F (36.3 C)  TempSrc:   Oral Oral  SpO2: 96%  96% 97%  Weight:  64.7 kg    Height:        Intake/Output Summary (Last 24 hours) at 07/27/2019 0938 Last data filed at 07/27/2019 0443 Gross per 24 hour  Intake 1032 ml  Output 3385 ml  Net -2353 ml   Last 3 Weights 07/27/2019 07/26/2019 07/25/2019  Weight (lbs) 142 lb 11.2 oz 148 lb 6.4 oz 145 lb 12.8 oz  Weight (kg) 64.728 kg 67.314 kg 66.134 kg      Telemetry    Sinus rhythm- Personally Reviewed  ECG    None new- Personally Reviewed  Physical Exam   GEN: Well nourished, well developed, in no acute distress  HEENT: normal  Neck: no JVD, carotid bruits, or masses Cardiac: RRR; no murmurs, rubs, or gallops, 2+ edema to the mid shin Respiratory:  clear to auscultation bilaterally, normal work of breathing GI: soft, nontender, nondistended, + BS MS: no  deformity or atrophy  Skin: warm and dry Neuro:  Strength and sensation are intact Psych: euthymic mood, full affect    Labs    High Sensitivity Troponin:  No results for input(s): TROPONINIHS in the last 720 hours.    Chemistry Recent Labs  Lab 07/22/19 1122 07/23/19 0643 07/25/19 0430 07/26/19 0350 07/27/19 0554  NA 136   < > 137 134* 137  K 4.2   < > 3.8 4.3 4.2  CL 102   < > 96* 96* 96*  CO2 24   < > 32 31 30  GLUCOSE 201*   < > 101* 158* 81  BUN 16   < > 12 18 20   CREATININE 1.26*   < > 1.04 1.25* 1.13  CALCIUM 8.5*   < > 8.7* 8.7* 9.1  PROT 5.9*  --   --   --   --   ALBUMIN 2.1*  --   --   --   --   AST 13*  --   --   --   --   ALT <5  --   --   --   --   ALKPHOS 76  --   --   --   --   BILITOT 0.8  --   --   --   --  GFRNONAA 54*   < > >60 55* >60  GFRAA >60   < > >60 >60 >60  ANIONGAP 10   < > 9 7 11    < > = values in this interval not displayed.     Hematology Recent Labs  Lab 07/22/19 1122 07/25/19 0430  WBC 6.4 5.9  RBC 2.89* 2.82*  HGB 8.6* 8.5*  HCT 28.1* 27.1*  MCV 97.2 96.1  MCH 29.8 30.1  MCHC 30.6 31.4  RDW 15.5 15.2  PLT 247 270    BNP Recent Labs  Lab 07/22/19 1711  BNP 559.2*     DDimer No results for input(s): DDIMER in the last 168 hours.   Radiology    No results found.  Cardiac Studies   TEE-CV personally reviewed.  Patient Profile     79 y.o. male with a history of persistent atrial flutter on Eliquis,chronic combined systolic and diastolic heart failure, hypertension, and recent left lumbar L4-L5 microdiscectomy complicated by wound infection with sepsis and bacteremiawho is being seen for the evaluation and management of heart failure and atrial flutter.  Assessment & Plan    Acute on chronic combined systolic and diastolic heart failure Echo with ejection fraction of 55 to 60%.  Currently on IV Lasix and having good diuresis net out 7.1 L.  Is out approximately 2-1/2 L since yesterday.  Would continue with  IV diuresis as his creatinine has remained stable.   Persistent Aflutter CHA2DS2-VASc of 4 on Eliquis.  Post TEE cardioversion remaining in sinus rhythm.   HTN Well controlled  For questions or updates, please contact [facility] Please consult [email or web address] for contact info under     Signed, Maxcine Strong Meredith Leeds, MD  07/27/2019, 9:38 AM

## 2019-07-27 NOTE — Progress Notes (Signed)
PROGRESS NOTE  Neil Cordova  DOB: [birth year]  PCP: Patient, No Pcp Per SWN:[id number]  DOA: 07/22/2019  LOS: 5 days   Chief Complaint  Patient presents with  . Blood Infection  . Chest Pain   Brief narrative: Patient is a 79 y.o. male  who was physically very active and did not have any past medical issues until recent surgery.   5/10, patient underwent L4-L5 microdiscectomy on 06/16/2019 by Dr. Conchita Paris 5/24 - 6/3, patient was hospitalized with acute metabolic encephalopathy with delirium. He was found to have MSSA bacteremia in the setting of surgical wound infection/ventral abscess.  He underwent repeat surgery cultures from the abscess which grew MSSA as well.  He was sent home with intravenous antibiotics (nafcillin) via a PICC line for 6 weeks with end date 7/11. During that hospital stay he was also diagnosed with new onset a flutter, he was rate controlled on metoprolol.  He was placed on Eliquis by cardiology.  2D echo was done showed an EF of 50-55%, no WMA however subsequent TEE to look for endocarditis actually showed a low EF of 30-35% with moderate to severe LV dysfunction. Apparently at discharge, he was not sent on cardiac medications.  Post discharge, patient started having progressive lower extremity swelling from the waist down and gained about 30 pounds. His legs became painful and red.   Also, when he was discharged did not have any antibiotics at home for at least 36 hours and he was quite upset about that.    Patient was admitted under hospitalist service for CHF exacerbation. Cardiology consultation was obtained.  Subjective: Patient was seen and examined this afternoon.  Walking on the hallway.  Eager to go home. Remains on IV Lasix.  Hoping to go home tomorrow..   Continues to have drain output  Assessment/Plan: Acute on chronic systolic CHF -In last hospitalization, he was noted to have EF 30 to 35% with moderate to severe LV dysfunction. -Bilateral pedal  edema started and progressively worsened since last hospitalization.  Not on diuretics at home.  -Cardiology consult appreciated.  -Echocardiogram repeated on 6/16 shows improvement in EF to 55 to 60%. -Patient is currently on Toprol 12.5 mg daily and Lasix IV 40 mg daily.  -Stable blood pressure, renal function. -Patient continues to have bilateral pedal edema. -Continue daily weights, strict I's and O's, telemetry monitoring.  -Monitor blood pressure, renal function and electrolytes. -Renal function has improved today.  A flutter, rate controlled -EKG in the ED showed A flutter with 4-1 conduction.   -Cardiology has started the patient on metoprolol low-dose.   -Patient underwent successful TEE cardioversion on 07/25/2019. -Eliquis has been resumed.  Recent MSSA bacteremia/MSSA abscess/postop complication -Follow prolonged course of IV Ancef at home planned for 6 weeks with end date 7/11. -He has a drain in place placed by IR on 5/29. -6/16, patient underwent CT scan of abdomen pelvis and evaluation by IR. -Patient continues to drain more than 10 mL of fluid today.  Patient will continue to monitor output at home.  Patient will follow with IR as an outpatient to get the tube pulled out once output is less than 10 mL.   Anemia of chronic disease -Patient denies any past history of blood loss, EGD or colonoscopy.   -Since surgery, patient's hemoglobin has been trending less than 10 mostly.   -Recent ferritin level 131 with low iron level and TIBC.   -Hemoglobin stable between 8 and 9. GI evaluation as an outpatient.  Hyperglycemia -  Glucose on the initial BMP is 201.  No history of DM.   -A1c 6.3.  Possible cellulitis -Both of his lower extremities are red associated with swelling.  Could be from CHF itself but also cannot rule out possibility of coexisting cellulitis.  Patient is already on IV Ancef.   -Redness seems improving.    Mobility: Encourage ambulation.  PT  evaluation Code Status:  Full code  DVT prophylaxis:apixaban (ELIQUIS) tablet 5 mg  Antimicrobials:  IV Ancef Fluid: None Diet: Cardiac diet  Consultants: Cardiology Family Communication:  Family not at bedside today.  Status is: Inpatient Remains inpatient appropriate because:IV treatments appropriate due to intensity of illness or inability to take PO and Inpatient level of care appropriate due to severity of illness   Dispo: The patient is from: Home              Anticipated d/c is to: Home              Anticipated d/c date is: 2 days              Patient currently is not medically stable to d/c.  Antimicrobials: Anti-infectives (From admission, onward)   Start     Dose/Rate Route Frequency Ordered Stop   07/23/19 0200  ceFAZolin (ANCEF) IVPB 2g/100 mL premix     Discontinue     2 g 200 mL/hr over 30 Minutes Intravenous Every 8 hours 07/22/19 1829     07/22/19 1800  nafcillin 12 g in dextrose 5 % 50 mL IVPB  Status:  Discontinued        12 g 100 mL/hr over 30 Minutes Intravenous Daily 07/22/19 1749 07/22/19 1829        Code Status: Full Code   Diet Order            Diet 2 gram sodium Room service appropriate? Yes; Fluid consistency: Thin  Diet effective now                 Infusions:  . sodium chloride 250 mL (07/27/19 0905)  .  ceFAZolin (ANCEF) IV 2 g (07/27/19 0906)    Scheduled Meds: . ALPRAZolam  1 mg Oral QHS  . apixaban  5 mg Oral BID  . Chlorhexidine Gluconate Cloth  6 each Topical Daily  . furosemide  40 mg Intravenous BID  . PARoxetine  30 mg Oral QPM  . polyethylene glycol  17 g Oral Daily  . sodium chloride flush  3 mL Intravenous Q12H  . sodium chloride flush  5 mL Intracatheter Q8H    PRN meds: sodium chloride, acetaminophen, fluticasone, loratadine, menthol-cetylpyridinium, ondansetron (ZOFRAN) IV, oxyCODONE-acetaminophen, sodium chloride flush   Objective: Vitals:   07/27/19 0431 07/27/19 0851  BP: (!) 152/58 (!) 130/49  Pulse: 64  73  Resp: 18   Temp: 98.2 F (36.8 C) (!) 97.4 F (36.3 C)  SpO2: 96% 97%    Intake/Output Summary (Last 24 hours) at 07/27/2019 1433 Last data filed at 07/27/2019 0850 Gross per 24 hour  Intake 1032 ml  Output 2635 ml  Net -1603 ml   Filed Weights   07/25/19 0431 07/26/19 0204 07/27/19 0427  Weight: 66.1 kg 67.3 kg 64.7 kg   Weight change: -2.586 kg Body mass index is 20.48 kg/m.   Physical Exam: General exam: Appears calm and comfortable.  Not in physical distress Skin: No rashes, lesions or ulcers. HEENT: Atraumatic, normocephalic, supple neck, no obvious bleeding Lungs: Clear to auscultation bilaterally CVS: Regular rhythm, no  murmur  GI/Abd soft, nontender, nondistended, bowel sound present CNS: Alert, awake, oriented x3 Psychiatry: Mood appropriate Extremities: Improving bilateral pedal edema 1+ bilaterally, no calf tenderness  Data Review: I have personally reviewed the laboratory data and studies available.  Recent Labs  Lab 07/22/19 1122 07/25/19 0430  WBC 6.4 5.9  NEUTROABS 4.7 3.4  HGB 8.6* 8.5*  HCT 28.1* 27.1*  MCV 97.2 96.1  PLT 247 270   Recent Labs  Lab 07/23/19 0643 07/24/19 0549 07/25/19 0430 07/26/19 0350 07/27/19 0554  NA 138 136 137 134* 137  K 3.5 3.4* 3.8 4.3 4.2  CL 98 97* 96* 96* 96*  CO2 29 28 32 31 30  GLUCOSE 100* 110* 101* 158* 81  BUN 9 10 12 18 20   CREATININE 1.12 1.09 1.04 1.25* 1.13  CALCIUM 8.9 8.7* 8.7* 8.7* 9.1  MG  --   --  1.7  --   --   PHOS  --   --  3.3  --   --    No results for input(s): HGBA1C in the last 72 hours.   Signed, Terrilee Croak, MD [facility] Pager: [phone] (Secure Chat preferred). 07/27/2019

## 2019-07-28 ENCOUNTER — Encounter: Payer: Self-pay | Admitting: Gastroenterology

## 2019-07-28 ENCOUNTER — Other Ambulatory Visit (HOSPITAL_COMMUNITY): Payer: Self-pay | Admitting: Radiology

## 2019-07-28 ENCOUNTER — Other Ambulatory Visit: Payer: Self-pay | Admitting: Family Medicine

## 2019-07-28 ENCOUNTER — Other Ambulatory Visit: Payer: Self-pay | Admitting: Neurosurgery

## 2019-07-28 DIAGNOSIS — L0291 Cutaneous abscess, unspecified: Secondary | ICD-10-CM

## 2019-07-28 DIAGNOSIS — D500 Iron deficiency anemia secondary to blood loss (chronic): Secondary | ICD-10-CM | POA: Insufficient documentation

## 2019-07-28 DIAGNOSIS — I471 Supraventricular tachycardia: Secondary | ICD-10-CM

## 2019-07-28 DIAGNOSIS — I10 Essential (primary) hypertension: Secondary | ICD-10-CM

## 2019-07-28 DIAGNOSIS — I483 Typical atrial flutter: Secondary | ICD-10-CM

## 2019-07-28 LAB — BASIC METABOLIC PANEL
Anion gap: 11 (ref 5–15)
BUN: 14 mg/dL (ref 8–23)
CO2: 31 mmol/L (ref 22–32)
Calcium: 9.4 mg/dL (ref 8.9–10.3)
Chloride: 96 mmol/L — ABNORMAL LOW (ref 98–111)
Creatinine, Ser: 1.25 mg/dL — ABNORMAL HIGH (ref 0.61–1.24)
GFR calc Af Amer: >60 mL/min (ref >60)
GFR calc non Af Amer: 55 mL/min — ABNORMAL LOW (ref >60)
Glucose, Bld: 154 mg/dL — ABNORMAL HIGH (ref 70–99)
Potassium: 3.9 mmol/L (ref 3.5–5.1)
Sodium: 138 mmol/L (ref 135–145)

## 2019-07-28 MED ORDER — METOPROLOL SUCCINATE ER 25 MG PO TB24: 12.5000 mg | ORAL_TABLET | Freq: Every day | ORAL | 0 refills | Status: DC

## 2019-07-28 MED ORDER — CEFAZOLIN SODIUM-DEXTROSE 2-4 GM/100ML-% IV SOLN: 2.0000 g | Freq: Three times a day (TID) | INTRAVENOUS | Status: AC

## 2019-07-28 MED ORDER — METOPROLOL SUCCINATE ER 25 MG PO TB24
12.5000 mg | ORAL_TABLET | Freq: Every day | ORAL | Status: DC
  Administered 2019-07-28: 12.5 mg via ORAL
  Filled 2019-07-28: qty 1

## 2019-07-28 MED ORDER — CEFAZOLIN IV (FOR PTA / DISCHARGE USE ONLY): 2.0000 g | Freq: Three times a day (TID) | INTRAVENOUS | 0 refills | Status: AC

## 2019-07-28 MED ORDER — HEPARIN SOD (PORK) LOCK FLUSH 100 UNIT/ML IV SOLN
250.0000 [IU] | INTRAVENOUS | Status: AC | PRN
  Administered 2019-07-28: 250 [IU]
  Filled 2019-07-28: qty 2.5

## 2019-07-28 MED ORDER — LISINOPRIL 10 MG PO TABS
10.0000 mg | ORAL_TABLET | Freq: Every day | ORAL | Status: DC
  Administered 2019-07-28: 10 mg via ORAL
  Filled 2019-07-28: qty 1

## 2019-07-28 NOTE — TOC Transition Note (Signed)
Transition of Care [facility]) - CM/SW Discharge Note   Patient Details  Name: Neil Cordova MRN: [id number] Date of Birth: Aug 11, 1940  [facility] CM/SW Contact:  Lawerance Sabal, RN Phone Number: 07/28/2019, 10:37 AM   Clinical Narrative:    Received call from patient's daughter Aundra Millet. She informed me that the plan will be for the patient to DC to home today (no DC order at this time). Confirmed plan to use [facility] and [facility] home health. Patient will have home IV antibiotics. I notified Jeri Modena w [facility] who is familiar w patient. She will obtain new OPAT order. Notified Pam of last administered time of 8am this morning, next due dose according to hospital schedule will be 6pm. Pam will contact daughter Aundra Millet and work with her to get patient back on more convenient administration times at home. Aundra Millet provides that her mother is trained in administering the medication, as is she. Notified Toney Rakes [facility] that patient will DC to home today. Elnita Maxwell verified she will have RN at the home tomorrow.     Final next level of care: Home w Home Health Services Barriers to Discharge: No Barriers Identified   Patient Goals and CMS Choice Patient states their goals for this hospitalization and ongoing recovery are:: return home CMS Medicare.gov Compare Post Acute Care list provided to:: Other (Comment Required) (Patient has HHC services as prior to admission) Choice offered to / list presented to : NA  Discharge Placement                       Discharge Plan and Services In-house Referral: NA Discharge Planning Services: CM Consult Post Acute Care Choice: Hospice            DME Agency: NA       HH Arranged: RN, PT, IV Antibiotics HH Agency: [facility] Date [facility] Agency Contacted: 07/28/19 Time HH Agency Contacted: 1036 Representative spoke with at [facility] Agency: Jeri Modena RN, Becky Sax RN  Social Determinants of Health (SDOH)  Interventions Food Insecurity Interventions: Intervention Not Indicated Financial Strain Interventions: Intervention Not Indicated Housing Interventions: Intervention Not Indicated Physical Activity Interventions: Intervention Not Indicated Transportation Interventions: Intervention Not Indicated   Readmission Risk Interventions No flowsheet data found.

## 2019-07-28 NOTE — Progress Notes (Signed)
Referring Physician(s): [facility] Dr B Dahal  Supervising Physician: Aletta Edouard  Patient Status:  [facility] - In-pt  Chief Complaint:  Lumbar infection post surgery  Subjective:  Drain placed 5/29 Drain intact OP 30-40 cc daily-- milky color  CT 6/16: Post percutaneous drainage catheter placement with interval reduction/near resolution of serpiginous fluid collection within the subcutaneous tissues about the left lower back/flank. No new definable/drainable fluid collections.  Drain left in place Now for DC soon with drain  Allergies: Patient has no known allergies.  Medications: Prior to Admission medications   Medication Sig Start Date End Date Taking? Authorizing Provider  acetaminophen (TYLENOL) 325 MG tablet Take 650 mg by mouth every 6 (six) hours as needed for mild pain, moderate pain or headache.    Yes [provider]  ALPRAZolam Duanne Moron) 1 MG tablet Take 1 mg by mouth at bedtime.   Yes [provider]  apixaban (ELIQUIS) 5 MG TABS tablet Take 1 tablet (5 mg total) by mouth 2 (two) times daily. 07/10/19  Yes Costella, Vista Mink, PA-C  fluticasone (FLONASE) 50 MCG/ACT nasal spray Place 1 spray into both nostrils daily as needed for allergies or rhinitis.   Yes [provider]  lisinopril (ZESTRIL) 10 MG tablet Take 10 mg by mouth at bedtime.    Yes [provider]  loratadine (CLARITIN) 10 MG tablet Take 10 mg by mouth daily as needed for allergies.   Yes [provider]  nafcillin IVPB Inject 12 g into the vein daily. As a continuous infusion via PICC line.Indication:  MSSA bacteremia/discitis/ventral epidural abscess First Dose: Yes Last Day of Therapy:  08/17/2019 Labs - Twice weekly:  CBC/D and BMP, Labs - Every other week:  ESR and CRP Method of administration: Elastomeric (Continuous infusion) Method of administration may be changed at the discretion of home infusion pharmacist based upon assessment of the patient and/or  caregiver's ability to self-administer the medication ordered. 07/10/19 08/19/19 Yes Costella, Vista Mink, PA-C  oxyCODONE-acetaminophen (PERCOCET) 7.5-325 MG tablet Take 0.5-1 tablets by mouth every 4 (four) hours as needed for severe pain.    Yes [provider]  PARoxetine (PAXIL) 30 MG tablet Take 30 mg by mouth every evening.   Yes [provider]  polyethylene glycol powder (GLYCOLAX/MIRALAX) 17 GM/SCOOP powder Take 17 g by mouth daily as needed for mild constipation.   Yes [provider]  Probiotic Product (PROBIOTIC DAILY) CAPS Take 1 capsule by mouth daily.   Yes [provider]  psyllium (METAMUCIL) 58.6 % packet Take 1 packet by mouth See admin instructions. Mix and drink 1 packet by mouth every other night   Yes [provider]  Turmeric 500 MG TABS Take 500 mg by mouth daily.   Yes [provider]  ceFAZolin (ANCEF) 2-4 GM/100ML-% IVPB Inject 100 mLs (2 g total) into the vein every 8 (eight) hours for 20 days. 07/28/19 08/17/19  Terrilee Croak, MD  ceFAZolin (ANCEF) IVPB Inject 2 g into the vein every 8 (eight) hours for 20 days. Indication:  MSSA bacteremia/discitis First Dose: Yes Last Day of Therapy:  08/17/19 Labs - Once weekly:  CBC/D and BMP, Labs - Every other week:  ESR and CRP Method of administration: IV Push Method of administration may be changed at the discretion of home infusion pharmacist based upon assessment of the patient and/or caregiver's ability to self-administer the medication ordered. 07/28/19 08/17/19  Terrilee Croak, MD  ibuprofen (ADVIL) 200 MG tablet Take 200 mg by mouth  every 6 (six) hours as needed for mild pain or moderate pain. Patient not taking: Reported on 07/22/2019    [provider]  metoprolol succinate (TOPROL-XL) 25 MG 24 hr tablet Take 0.5 tablets (12.5 mg total) by mouth daily. 07/28/19 10/26/19  Terrilee Croak, MD  sildenafil (VIAGRA) 100 MG tablet Take 100 mg by mouth daily as needed for  erectile dysfunction. Patient not taking: Reported on 07/22/2019    [provider]     Vital Signs: BP (!) 149/67 (BP Location: Left Arm)   Pulse (!) 58   Temp 98 F (36.7 C) (Oral)   Resp 15   Ht 5' 10"  (1.778 m)   Wt 135 lb 12.8 oz (61.6 kg)   SpO2 98%   BMI 19.49 kg/m   Physical Exam Skin:    General: Skin is warm and dry.     Comments: Site is clean and dry NT no bleeding OP milky yellow Flushes easily 10 cc in JP 30 cc OP yesterday     Imaging: ECHO TEE  Result Date: 07/25/2019    TRANSESOPHOGEAL ECHO REPORT   Patient Name:   Neil Cordova Date of Exam: 07/25/2019 Medical Rec #:  [id number]     Height:       70.0 in Accession #:    [id number]    Weight:       145.8 lb Date of Birth:  [birth year]     BSA:          1.825 m Patient Age:    79 years      BP:           141/51 mmHg Patient Gender: M             HR:           64 bpm. Exam Location:  Inpatient Procedure: 3D Echo, Transesophageal Echo and Color Doppler Indications:     I48.92* Unspecified atrial flutter  History:         Patient has prior history of Echocardiogram examinations, most                  recent 07/23/2019. CHF, Abnormal ECG, Arrythmias:Atrial Flutter;                  Signs/Symptoms:Altered Mental Status and Bacteremia.  Sonographer:     Roseanna Rainbow RDCS Referring Phys:  [id number] Darreld Mclean Diagnosing Phys: Jenkins Rouge MD PROCEDURE: After discussion of the risks and benefits of a TEE, an informed consent was obtained from the patient. The patient was intubated. Anesthesia passed using glid scope after patient intubated. Imaged were obtained with the patient in a left lateral decubitus position. Sedation performed by different physician. The patient was monitored while under deep sedation. Anesthestetic sedation was provided intravenously by Anesthesiology: 237m of Propofol. Image quality was excellent. The patient developed no complications and Patient intubated electively during the procedure. A  successful direct current cardioversion was performed. IMPRESSIONS  1. Anesthesia/Cardiology unable to pass scope with propofol Patient electively intubated and Dr HDaiva Hugepassed probe using glide scope and direct visualization.  2. DCornellx 1 150J patient converted to NSR from flutter at rate of 64 bpm.  3. Left ventricular ejection fraction, by estimation, is 55 to 60%. The left ventricle has normal function. The left ventricle has no regional wall motion abnormalities. Left ventricular diastolic function could not be evaluated.  4. Right ventricular systolic function is normal. The right ventricular size is normal.  5.  Left atrial size was moderately dilated. No left atrial/left atrial appendage thrombus was detected.  6. Right atrial size was moderately dilated.  7. The mitral valve is normal in structure. Mild mitral valve regurgitation.  8. The aortic valve is tricuspid. Aortic valve regurgitation is not visualized. No aortic stenosis is present.  9. PFO present. There is a small patent foramen ovale with predominantly left to right shunting across the atrial septum. FINDINGS  Left Ventricle: Left ventricular ejection fraction, by estimation, is 55 to 60%. The left ventricle has normal function. The left ventricle has no regional wall motion abnormalities. The left ventricular internal cavity size was normal in size. There is  no left ventricular hypertrophy. Left ventricular diastolic function could not be evaluated. Right Ventricle: The right ventricular size is normal. Right vetricular wall thickness was not assessed. Right ventricular systolic function is normal. Left Atrium: Left atrial size was moderately dilated. No left atrial/left atrial appendage thrombus was detected. Right Atrium: Right atrial size was moderately dilated. Pericardium: There is no evidence of pericardial effusion. Mitral Valve: The mitral valve is normal in structure. Mild mitral valve regurgitation. Tricuspid Valve: The tricuspid valve  is normal in structure. Tricuspid valve regurgitation is mild. Aortic Valve: The aortic valve is tricuspid. Aortic valve regurgitation is not visualized. No aortic stenosis is present. Pulmonic Valve: The pulmonic valve was normal in structure. Pulmonic valve regurgitation is trivial. Aorta: The aortic root is normal in size and structure. IAS/Shunts: There is redundancy of the interatrial septum. PFO present. A small patent foramen ovale is detected with predominantly left to right shunting across the atrial septum. Additional Comments: Anesthesia/Cardiology unable to pass scope with propofol Patient electively intubated and Dr Daiva Huge passed probe using glide scope and direct visualization. Manuel Garcia x 1 150J patient converted to NSR from flutter at rate of 64 bpm. Jenkins Rouge MD Electronically signed by Jenkins Rouge MD Signature Date/Time: 07/25/2019/10:06:24 AM    Final     Labs:  CBC: Recent Labs    07/07/19 0137 07/08/19 0653 07/22/19 1122 07/25/19 0430  WBC 9.6 11.7* 6.4 5.9  HGB 8.4* 10.0* 8.6* 8.5*  HCT 25.6* 31.5* 28.1* 27.1*  PLT 271 335 247 270    COAGS: Recent Labs    06/30/19 1055 07/25/19 1029  INR 1.1 1.6*  APTT 27  --     BMP: Recent Labs    07/25/19 0430 07/26/19 0350 07/27/19 0554 07/28/19 0837  NA 137 134* 137 138  K 3.8 4.3 4.2 3.9  CL 96* 96* 96* 96*  CO2 32 31 30 31   GLUCOSE 101* 158* 81 154*  BUN 12 18 20 14   CALCIUM 8.7* 8.7* 9.1 9.4  CREATININE 1.04 1.25* 1.13 1.25*  GFRNONAA >60 55* >60 55*  GFRAA >60 >60 >60 >60    LIVER FUNCTION TESTS: Recent Labs    06/26/19 2020 06/30/19 1055 07/03/19 1159 07/22/19 1122  BILITOT 1.6* 1.1 1.1 0.8  AST 17 29 42* 13*  ALT 19 23 25  <5  ALKPHOS 49 76 102 76  PROT 6.5 5.3* 5.0* 5.9*  ALBUMIN 3.6 2.3* 1.9* 2.1*    Assessment and Plan:  For DC soon with drain Pt must flush drain once daily- 5 to 10 cc (please be sure pt has flushes at home - or Rx for same) Record OP daily Pt will hear from [facility] for drain evaluation and CT OP orders in place  Electronically Signed: Lavonia Drafts, PA-C 07/28/2019, 11:21 AM   I  spent a total of 15 Minutes at the the patient's bedside AND on the patient's hospital floor or unit, greater than 50% of which was counseling/coordinating care for drain care

## 2019-07-28 NOTE — Progress Notes (Signed)
PHARMACY CONSULT NOTE FOR:  OUTPATIENT  PARENTERAL ANTIBIOTIC THERAPY (OPAT)  Indication: MSSA bacteremia/discitis Regimen: Cefazolin 2 gm IV q 8 hours End date: 08/17/19  IV antibiotic discharge orders are pended. To discharging provider:  please sign these orders via discharge navigator,  Select New Orders & click on the button choice - Manage This Unsigned Work.     Thank you for allowing pharmacy to be a part of this patient's care.  Sharin Mons, PharmD, BCPS, BCIDP Infectious Diseases Clinical Pharmacist Phone: [phone] 07/28/2019, 10:58 AM

## 2019-07-28 NOTE — Progress Notes (Signed)
Discharged to home.D/c instructions and follow up appt discussed with pt and wife verbalized understanding.

## 2019-07-28 NOTE — TOC Transition Note (Signed)
Transition of Care [facility]) - CM/SW Discharge Note   Patient Details  Name: Neil Cordova MRN: [id number] Date of Birth: 12-Mar-1940  [facility] CM/SW Contact:  Janae Bridgeman, RN Phone Number: 07/28/2019, 12:16 PM   Clinical Narrative:    Case management called Trixie Deis, RNCM and patient has been set up for IV therapy services and home health through [facility] as ordered.  Jeri Modena is calling the daughter to followup.  I called Delorise Shiner, RN on [address] and the patient may be discharged home.   Final next level of care: Home w Home Health Services Barriers to Discharge: No Barriers Identified   Patient Goals and CMS Choice Patient states their goals for this hospitalization and ongoing recovery are:: return home CMS Medicare.gov Compare Post Acute Care list provided to:: Other (Comment Required) (Patient has HHC services as prior to admission) Choice offered to / list presented to : NA  Discharge Placement                       Discharge Plan and Services In-house Referral: NA Discharge Planning Services: CM Consult Post Acute Care Choice: Hospice            DME Agency: NA       HH Arranged: RN, PT, IV Antibiotics HH Agency: [facility], [facility] Date [facility] Agency Contacted: 07/28/19 Time HH Agency Contacted: 1036 Representative spoke with at [facility] Agency: Jeri Modena RN, Becky Sax RN  Social Determinants of Health (SDOH) Interventions Food Insecurity Interventions: Intervention Not Indicated Financial Strain Interventions: Intervention Not Indicated Housing Interventions: Intervention Not Indicated Physical Activity Interventions: Intervention Not Indicated Transportation Interventions: Intervention Not Indicated   Readmission Risk Interventions Readmission Risk Prevention Plan 07/28/2019  Transportation Screening Complete  PCP or Specialist Appt within 5-7 Days Complete  Home Care Screening Complete  Medication  Review (RN CM) Referral to Pharmacy

## 2019-07-28 NOTE — Progress Notes (Signed)
Progress Note  Patient Name: Neil Cordova Date of Encounter: 07/28/2019  [facility] Cardiologist: Jodelle Red, MD   Subjective   Feels great.  Eager to go home. Telemetry shows occasional brief episodes of ectopic atrial tachycardia with 1: 1 AV conduction at about 120 bpm.  They will last for just a couple of minutes.  There is a distinct P wave, with morphology slightly different from the sinus P wave and with longer PR interval (long RP tachycardia).  No recurrence of atrial flutter or atrial fibrillation. Excellent diuresis, net -9.3 L since admission including -1.5 L overnight  Inpatient Medications    Scheduled Meds: . ALPRAZolam  1 mg Oral QHS  . apixaban  5 mg Oral BID  . Chlorhexidine Gluconate Cloth  6 each Topical Daily  . furosemide  40 mg Intravenous BID  . lisinopril  10 mg Oral Daily  . metoprolol succinate  12.5 mg Oral Daily  . PARoxetine  30 mg Oral QPM  . polyethylene glycol  17 g Oral Daily  . sodium chloride flush  3 mL Intravenous Q12H  . sodium chloride flush  5 mL Intracatheter Q8H   Continuous Infusions: . sodium chloride 250 mL (07/27/19 0905)  .  ceFAZolin (ANCEF) IV 2 g (07/28/19 0817)   PRN Meds: sodium chloride, acetaminophen, fluticasone, loratadine, menthol-cetylpyridinium, ondansetron (ZOFRAN) IV, oxyCODONE-acetaminophen, sodium chloride flush   Vital Signs    Vitals:   07/27/19 1753 07/27/19 2046 07/28/19 0055 07/28/19 0436  BP: 137/62 (!) 131/58  (!) 149/67  Pulse: 66 60  (!) 58  Resp:  16  15  Temp:  98.7 F (37.1 C)  98 F (36.7 C)  TempSrc:  Oral  Oral  SpO2: 100% 97%  98%  Weight:   61.6 kg   Height:        Intake/Output Summary (Last 24 hours) at 07/28/2019 1035 Last data filed at 07/28/2019 1000 Gross per 24 hour  Intake 1495 ml  Output 3868 ml  Net -2373 ml   Last 3 Weights 07/28/2019 07/27/2019 07/26/2019  Weight (lbs) 135 lb 12.8 oz 142 lb 11.2 oz 148 lb 6.4 oz  Weight (kg) 61.598 kg 64.728 kg 67.314  kg      Telemetry    Sinus rhythm with occasional episodes of atrial tachycardia- Personally Reviewed  ECG    Sinus rhythm, left anterior fascicular block, lateral T wave inversion - Personally Reviewed  Physical Exam  Appears thin and fit and younger than stated age GEN: No acute distress.   Neck: No JVD Cardiac: RRR, no murmurs, rubs, or gallops.  Respiratory: Clear to auscultation bilaterally. GI: Soft, nontender, non-distended  MS: No edema; No deformity. Neuro:  Nonfocal  Psych: Normal affect   Labs    High Sensitivity Troponin:  No results for input(s): TROPONINIHS in the last 720 hours.    Chemistry Recent Labs  Lab 07/22/19 1122 07/23/19 0643 07/26/19 0350 07/27/19 0554 07/28/19 0837  NA 136   < > 134* 137 138  K 4.2   < > 4.3 4.2 3.9  CL 102   < > 96* 96* 96*  CO2 24   < > 31 30 31   GLUCOSE 201*   < > 158* 81 154*  BUN 16   < > 18 20 14   CREATININE 1.26*   < > 1.25* 1.13 1.25*  CALCIUM 8.5*   < > 8.7* 9.1 9.4  PROT 5.9*  --   --   --   --  ALBUMIN 2.1*  --   --   --   --   AST 13*  --   --   --   --   ALT <5  --   --   --   --   ALKPHOS 76  --   --   --   --   BILITOT 0.8  --   --   --   --   GFRNONAA 54*   < > 55* >60 55*  GFRAA >60   < > >60 >60 >60  ANIONGAP 10   < > 7 11 11    < > = values in this interval not displayed.     Hematology Recent Labs  Lab 07/22/19 1122 07/25/19 0430  WBC 6.4 5.9  RBC 2.89* 2.82*  HGB 8.6* 8.5*  HCT 28.1* 27.1*  MCV 97.2 96.1  MCH 29.8 30.1  MCHC 30.6 31.4  RDW 15.5 15.2  PLT 247 270    BNP Recent Labs  Lab 07/22/19 1711  BNP 559.2*     DDimer No results for input(s): DDIMER in the last 168 hours.   Radiology    No results found.  Cardiac Studies   Transesophageal echocardiogram 07/25/2019 1. Anesthesia/Cardiology unable to pass scope with propofol Patient  electively intubated and Dr 07/27/2019 passed probe using glide scope and  direct visualization.  2. DCC x 1 150J patient converted to  NSR from flutter at rate of 64 bpm.  3. Left ventricular ejection fraction, by estimation, is 55 to 60%. The  left ventricle has normal function. The left ventricle has no regional  wall motion abnormalities. Left ventricular diastolic function could not  be evaluated.  4. Right ventricular systolic function is normal. The right ventricular  size is normal.  5. Left atrial size was moderately dilated. No left atrial/left atrial  appendage thrombus was detected.  6. Right atrial size was moderately dilated.  7. The mitral valve is normal in structure. Mild mitral valve  regurgitation.  8. The aortic valve is tricuspid. Aortic valve regurgitation is not  visualized. No aortic stenosis is present.  9. PFO present. There is a small patent foramen ovale with predominantly  left to right shunting across the atrial septum.   Transthoracic echocardiogram 07/23/2019 1. Left ventricular ejection fraction, by estimation, is 55 to 60%. The  left ventricle has normal function. The left ventricle has no regional  wall motion abnormalities. Left ventricular diastolic parameters are  indeterminate.  2. Right ventricular systolic function is normal. The right ventricular  size is normal.  3. Left atrial size was mildly dilated.  4. The mitral valve is normal in structure. Mild mitral valve  regurgitation. No evidence of mitral stenosis.  5. The aortic valve has an indeterminant number of cusps. Aortic valve  regurgitation is not visualized. Mild to moderate aortic valve  sclerosis/calcification is present, without any evidence of aortic  stenosis.  6. The inferior vena cava is normal in size with greater than 50%  respiratory variability, suggesting right atrial pressure of 3 mmHg.   Patient Profile     79 y.o. male with atrial flutter and transient left ventricular systolic dysfunction in the setting of spinal infection and sepsis, status post successful cardioversion 6/18, with  resolution of left ventricular dysfunction.  Assessment & Plan    From a cardiac point of view he appears ready for discharge. Clinically euvolemic, I do not think he needs any more diuretics. Blood pressure increasing, will resume his home  dose of lisinopril. We will add a low-dose of metoprolol succinate to help reduce the likelihood that the episodes of ectopic atrial tachycardia will deteriorate to more complex atrial arrhythmia. Reassess the prevalence of atrial arrhythmia with long-term event monitor after complete resolution of his infection. Reinforced the critical importance of uninterrupted anticoagulation for 30 days following cardioversion.  This was discussed with both the patient and his wife. His wife wants to make sure that the antibiotics are delivered to their home promptly's, since on his last discharge it took 30 hours for them to be restarted.     [facility] will sign off.   Medication Recommendations: Lisinopril 10 mg once daily, metoprolol succinate 12.5 mg once daily, Eliquis 5 mg twice daily. Other recommendations (labs, testing, etc): Follow-up outpatient 30-day event monitor after he completes antibiotic therapy for his infection. Follow up as an outpatient: Has an appointment scheduled for 08/01/2019 with Leda Quail, PA  For questions or updates, please contact [place] Please consult [email or web address] for contact info under        Signed, Sanda Klein, MD  07/28/2019, 10:35 AM

## 2019-07-28 NOTE — Discharge Summary (Signed)
Physician Discharge Summary  Neil Cordova QJF:[id number] DOB: 06-Aug-1940 DOA: 07/22/2019  PCP: Patient, No Pcp Per  Admit date: 07/22/2019 Discharge date: 07/28/2019  Admitted From: Home Discharge disposition: Home with home health   Code Status: Full Code  Diet Recommendation: Cardiac diet.   Recommendations for Outpatient Follow-Up:   1. Follow-up with radiology as an outpatient for drain removal when output is less than 10 mL 2. Follow-up with cardiology as an outpatient.  Appointment given for 6/25 3. Referral to GI given for anemia evaluation.  Discharge Diagnosis:   Active Problems:   Acute systolic CHF (congestive heart failure) ([place])    History of Present Illness / Brief narrative:  Patient is a 79 y.o.male who was physically very active and did not have any past medical issues until recent surgery.   5/10, patient underwent L4-L5 microdiscectomyon 5/10/2021by Dr. Kathyrn Cordova 5/24 - 6/3, patient was hospitalized with acute metabolic encephalopathy with delirium. He was found to have MSSA bacteremia in the setting of surgical wound infection/ventral abscess.  He underwent repeat surgery cultures from the abscess which grew MSSA as well. He was sent home with intravenous antibiotics (nafcillin) via a PICC line for 6 weeks with end date 7/11. During that hospital stay he was also diagnosed with new onset a flutter, he was rate controlled on metoprolol. He was placed on Eliquis by cardiology. 2D echo was done showed an EF of 50-55%, no WMA however subsequent TEE to look for endocarditis actually showed a low EF of 30-35% with moderate to severe LV dysfunction. Apparently at discharge, he was not sent on cardiac medications.  Post discharge, patient started having progressive lower extremity swelling from the waist down and gained about 30 pounds. His legs became painful and red.  Also, when he was discharged did not have any antibiotics at home for at least 36 hours and he was  quite upset about that.   Patient was admitted under hospitalist service for CHF exacerbation. Cardiology consultation was obtained. Patient was aggressively diuresed, 9.3 L since admission. Pedal edema significantly improved.  Hospital Course:  Acute on chronic systolic CHF -In last hospitalization, he was noted to have EF 30 to 35% with moderate to severe LV dysfunction. -Bilateral pedal edema started and progressively worsened since last hospitalization.  Not on diuretics at home.  -Cardiology consult appreciated.  -Echocardiogram repeated on 6/16 shows improvement in EF to 55 to 60%. -Patient is currently on Toprol 12.5 mg daily and Lasix IV 40 mg daily.  -Patient was aggressively diuresed, 9.3 L since admission. -Pedal edema significantly improved. -Stable blood pressure, renal function. -Per cardiology note today, patient does not need any more diuresis.  Low-dose metoprolol succinate at 12.5 mg daily. -Patient has follow-up appointment with cardiology on 6/25.  A flutter, rate controlled -EKG in the ED showed A flutter with 4-1 conduction. -Cardiology has started the patient on metoprolol low-dose.   -Patient underwent successful TEE cardioversion on 07/25/2019. -Eliquis has been resumed.  It has been reinforced that patient needs to have uninterrupted anticoagulation for 30 days following cardioversion. -After acute infection is controlled, patient needs follow-up with cardiology as an outpatient to reassess the presence of atrial arrhythmia with long-term event monitoring.  Recent MSSA bacteremia/MSSA abscess/postop complication -Follow prolonged course of IV Ancef at home planned for 6 weeks with end date 7/11. -He had a lumbar drain in place placed by IR on 5/29. -6/16, patient underwent CT scan of abdomen pelvis and evaluation by IR. -Patient continues to drain  more than 10 mL of fluid.  Patient will continue to monitor output at home.  Patient will follow with IR as an  outpatient to get the tube pulled out once output is less than 10 mL.   Anemia of chronic disease -Patient denies any past history of blood loss, EGD or colonoscopy.   -Since surgery, patient's hemoglobin has been trending less than 10 mostly.   -Recent ferritin level 131 with low iron level and TIBC.   -Hemoglobin stable between 8 and 9. GI evaluation as an outpatient advised.  Hyperglycemia Prediabetes -Glucose on the initial BMP is 201.  No history of DM.  -A1c 6.3.  Lifestyle changes advised.  Possible cellulitis -At presentation on both of his lower extremities were swollen and red.  There was mild component of cellulitis.  Definitely improving with IV Ancef that he is primarily getting for bacteremia.  Mobility:  On the hallway with walker. Code Status: Full code  Stable for discharge to home today.  Subjective:  Seen and examined this morning.  Very pleasant elderly Caucasian male.  Not in distress.  Swelling significantly improved.  Feels ready to go home.  Discharge Exam:   Vitals:   07/27/19 1753 07/27/19 2046 07/28/19 0055 07/28/19 0436  BP: 137/62 (!) 131/58  (!) 149/67  Pulse: 66 60  (!) 58  Resp:  16  15  Temp:  98.7 F (37.1 C)  98 F (36.7 C)  TempSrc:  Oral  Oral  SpO2: 100% 97%  98%  Weight:   61.6 kg   Height:        Body mass index is 19.49 kg/m.  General exam: Appears calm and comfortable.  Skin: No rashes, lesions or ulcers. HEENT: Atraumatic, normocephalic, supple neck, no obvious bleeding Lungs: Clear to auscultation bilaterally CVS: Regular rate and rhythm, no murmur GI/Abd soft, nontender, nondistended, bowel sound present Back: He continues to have lumbar region JP drain with more than 10 mL fluid CNS: Alert, awake, oriented x3 Psychiatry: Mood appropriate Extremities: Edema significantly improved, redness significantly improved.  Discharge Instructions:  Wound care:  Pressure Injury 07/01/19 Coccyx Stage 2 -  Partial thickness  loss of dermis presenting as a shallow open injury with a red, pink wound bed without slough. (Active)  07/01/19 0800  Location: Coccyx  Location Orientation:   Staging: Stage 2 -  Partial thickness loss of dermis presenting as a shallow open injury with a red, pink wound bed without slough.  Wound Description (Comments):   Present on Admission: Yes     Discharge Instructions    [facility] pharmacist to adjust dose for Vancomycin, Aminoglycosides and other anti-infective therapies as requested by physician.   Complete by: As directed    [facility] to provide Cath Flo 6m   Complete by: As directed    Administer for PICC line occlusion and as ordered by physician for other access device issues.   Ambulatory referral to Gastroenterology   Complete by: As directed    Anemia work up   Anaphylaxis Kit: Provided to treat any anaphylactic reaction to the medication being provided to the patient if First Dose or when requested by physician   Complete by: As directed    Epinephrine 178mml vial / amp: Administer 0.67m2m0.67ml16mubcutaneously once for moderate to severe anaphylaxis, nurse to call physician and pharmacy when reaction occurs and call 911 if needed for immediate care   Diphenhydramine 50mg32mIV vial: Administer 25-50mg 54mM PRN for first dose reaction,  rash, itching, mild reaction, nurse to call physician and pharmacy when reaction occurs   Sodium Chloride 0.9% NS 586m IV: Administer if needed for hypovolemic blood pressure drop or as ordered by physician after call to physician with anaphylactic reaction   Change dressing on IV access line weekly and PRN   Complete by: As directed    Flush IV access with Sodium Chloride 0.9% and Heparin 10 units/ml or 100 units/ml   Complete by: As directed    Home infusion instructions - [facility]   Complete by: As directed    Instructions: Flush IV access with Sodium Chloride 0.9% and Heparin 10units/ml or  100units/ml   Change dressing on IV access line: Weekly and PRN   Instructions Cath Flo '2mg'$ : Administer for PICC Line occlusion and as ordered by physician for other access device   [facility] pharmacist to adjust dose for: Vancomycin, Aminoglycosides and other anti-infective therapies as requested by physician   Increase activity slowly   Complete by: As directed    Leave dressing on - Keep it clean, dry, and intact until clinic visit   Complete by: As directed    Method of administration may be changed at the discretion of home infusion pharmacist based upon assessment of the patient and/or caregiver's ability to self-administer the medication ordered   Complete by: As directed       Follow-up Information    KLibby Maw MD Follow up on 07/31/2019.   Specialty: Family Medicine Why: at 11:30 am; please try to keep your apt or call to reschedule Contact information: [address] [place] [phone]        [facility] Follow up.   Why: A home health care nurse will continue to see you; Please contact CMalachy Moodwith [facility] for any questions [phone] information: [address]ita [phone]      WSandi Mariscal MD Follow up.   Specialties: Interventional Radiology, Radiology Why: Please call our office once the output from your drain is less than 10 mL in 24 hours and we will schedule a time for it to be removed. Please call with any questions or concerns. Contact information: [address]   [phone]        [facility] Follow up.   Why: Pharmacy for Infusions. PCarolynn Sayersis point of contact. [phone]      [facility] Follow up.   Specialty: Gastroenterology Contact information: [phone]            Allergies as of 07/28/2019   No Known Allergies       Medication List    STOP taking these medications   furosemide 20 MG tablet Commonly known as: LASIX   ibuprofen 200 MG tablet Commonly known as: ADVIL   nafcillin  IVPB   sildenafil 100 MG tablet Commonly known as: VIAGRA     TAKE these medications   acetaminophen 325 MG tablet Commonly known as: TYLENOL Take 650 mg by mouth every 6 (six) hours as needed for mild pain, moderate pain or headache.   ALPRAZolam 1 MG tablet Commonly known as: XANAX Take 1 mg by mouth at bedtime.   apixaban 5 MG Tabs tablet Commonly known as: ELIQUIS Take 1 tablet (5 mg total) by mouth 2 (two) times daily.   ceFAZolin  IVPB Commonly known as: ANCEF Inject 2 g into the vein  every 8 (eight) hours for 20 days. Indication:  MSSA bacteremia/discitis First Dose: Yes Last Day of Therapy:  08/17/19 Labs - Once weekly:  CBC/D and BMP, Labs - Every other week:  ESR and CRP Method of administration: IV Push Method of administration may be changed at the discretion of home infusion pharmacist based upon assessment of the patient and/or caregiver's ability to self-administer the medication ordered.   ceFAZolin 2-4 GM/100ML-% IVPB Commonly known as: ANCEF Inject 100 mLs (2 g total) into the vein every 8 (eight) hours for 20 days.   fluticasone 50 MCG/ACT nasal spray Commonly known as: FLONASE Place 1 spray into both nostrils daily as needed for allergies or rhinitis.   lisinopril 10 MG tablet Commonly known as: ZESTRIL Take 10 mg by mouth at bedtime.   loratadine 10 MG tablet Commonly known as: CLARITIN Take 10 mg by mouth daily as needed for allergies.   metoprolol succinate 25 MG 24 hr tablet Commonly known as: TOPROL-XL Take 0.5 tablets (12.5 mg total) by mouth daily.   oxyCODONE-acetaminophen 7.5-325 MG tablet Commonly known as: PERCOCET Take 0.5-1 tablets by mouth every 4 (four) hours as needed for severe pain.   PARoxetine 30 MG tablet Commonly known as: PAXIL Take 30 mg by mouth  every evening.   polyethylene glycol powder 17 GM/SCOOP powder Commonly known as: GLYCOLAX/MIRALAX Take 17 g by mouth daily as needed for mild constipation.   Probiotic Daily Caps Take 1 capsule by mouth daily.   psyllium 58.6 % packet Commonly known as: METAMUCIL Take 1 packet by mouth See admin instructions. Mix and drink 1 packet by mouth every other night   Turmeric 500 MG Tabs Take 500 mg by mouth daily.            Discharge Care Instructions  (From admission, onward)         Start     Ordered   07/28/19 0000  Change dressing on IV access line weekly and PRN  (Home infusion instructions - [facility] )        07/28/19 1105   07/28/19 0000  Leave dressing on - Keep it clean, dry, and intact until clinic visit        07/28/19 1105          Time coordinating discharge: 35 minutes  The results of significant diagnostics from this hospitalization (including imaging, microbiology, ancillary and laboratory) are listed below for reference.    Procedures and Diagnostic Studies:   DG Chest 2 View  Result Date: 07/22/2019 CLINICAL DATA:  Lower extremity swelling. EXAM: CHEST - 2 VIEW COMPARISON:  Chest x-ray dated July 08, 2019. FINDINGS: Unchanged right upper extremity PICC line. The heart size and mediastinal contours are within normal limits. Normal pulmonary vascularity. Trace bilateral pleural effusions. No consolidation or pneumothorax. No acute osseous abnormality. IMPRESSION: 1. Trace bilateral pleural effusions. Electronically Signed   By: Obie Dredge M.D.   On: 07/22/2019 11:46   CT ABDOMEN PELVIS W CONTRAST  Result Date: 07/24/2019 CLINICAL DATA:  History of abscess within the subcutaneous tissues about the caudal aspect of the left flank following L4-L5 laminectomy, post ultrasound-guided placement of percutaneous catheter into the subcutaneous flank abscess on 07/05/2019. EXAM: CT ABDOMEN AND PELVIS WITH CONTRAST TECHNIQUE: Multidetector CT imaging  of the abdomen and pelvis was performed using the standard protocol following bolus administration of intravenous contrast. CONTRAST:  OMNIPAQUE IOHEXOL 300 MG/ML  SOLN COMPARISON:  Ultrasound-guided subcutaneous drainage catheter placement-07/05/2019 Lumbar spine MRI-07/04/2019  FINDINGS: Lower chest: Limited visualization of the lower thorax demonstrates small/trace bilateral effusions with associated bibasilar atelectasis, right greater than left. Note is made of a punctate (approximately 0.4 cm granuloma within the right lower lobe (image 8, series 5). Normal heart size. Coronary artery calcifications. No pericardial effusion. Hepatobiliary: Normal hepatic contour. There are multiple punctate subcentimeter hypoattenuating hepatic lesions which are too small to adequately characterize though favored to represent hepatic cysts. Note is made of multiple punctate calcified hepatic granulomas. There is a minimal amount of focal fatty infiltration adjacent to the fissure for the ligamentum teres. No discrete worrisome hepatic lesions. Normal appearance of the gallbladder given degree distention. No radiopaque gallstones. No intra or extrahepatic biliary ductal dilatation. No ascites. Pancreas: Normal appearance of the pancreas. Spleen: Multiple punctate granulomas are seen within otherwise normal-appearing spleen. Adrenals/Urinary Tract: Symmetric enhancement and excretion of the bilateral kidneys. No evidence of nephrolithiasis on this postcontrast examination. Note is made of an approximately 1.4 cm hypoattenuating nonenhancing left-sided renal cyst. No discrete right-sided renal lesions. No urinary obstruction or perinephric stranding. There is mild thickening of the left adrenal gland without discrete nodule. Normal appearance of the right adrenal gland. Normal appearance of the urinary bladder given degree distention. Stomach/Bowel: Extensive colonic diverticulosis, primarily involving the sigmoid colon,  without evidence of superimposed acute diverticulitis. Moderate to large colonic stool burden without evidence of enteric obstruction. Normal appearance of the terminal ileum and the retrocecal appendix. No discrete areas of bowel wall thickening. No pneumoperitoneum, pneumatosis or portal venous gas. Vascular/Lymphatic: Moderate amount of slightly irregular mixed noncalcified though predominantly calcified atherosclerotic plaque within a normal caliber abdominal aorta. The major branch vessels of the abdominal aorta appear patent on this non CTA examination. No bulky retroperitoneal, mesenteric, pelvic or inguinal lymphadenopathy. Reproductive: Prostatomegaly with mass effect on the undersurface of the urinary bladder. No free fluid the pelvic cul-de-sac. Other: Interval reduction/near resolution of subcutaneous fluid collection about the caudal aspect of the left lower back/flank with residual serpiginous collection measuring approximately 8.8 x 6.4 x 0.9 cm (sagittal image 118, series 7; axial image 43, series 6). Diffuse body wall anasarca, most conspicuous about the bilateral flanks and lateral thighs bilaterally. Musculoskeletal: Post left-sided L4 laminectomy (axial image 45, series 3), with suspected residual ventral epidural thickening extending from L2 through L4, suboptimally evaluated. Severe DDD of L2-L3 and L3-L4 with disc space height loss, endplate irregularity and small posteriorly directed disc osteophyte complex at L3-L4. Additionally, there is endplate lucency about the L2-L3 and L3-L4 intervertebral disc spaces, most conspicuous at L2-L3 (coronal image 96, series 7) which is nonspecific though could be seen in the setting of discitis/osteomyelitis as was suggested on lumbar spine MRI performed 07/04/2019 Mild degenerative change of the bilateral hips, left greater than right, with joint space loss, subchondral sclerosis and osteophytosis. IMPRESSION: 1. Post percutaneous drainage catheter  placement with interval reduction/near resolution of serpiginous fluid collection within the subcutaneous tissues about the left lower back/flank. No new definable/drainable fluid collections. 2. Suspected ventral epidural thickening extending from L2-L4, incompletely evaluated on the present examination though previously was worrisome for an epidural abscess. Additionally, there is severe DDD of L2-L3 and L3-L4, however given associated endplate lucency findings could be attributable to discitis/osteomyelitis (as was suggested on previous lumbar spine MRI). Further evaluation with repeat contrast-enhanced lumbar spine MRI as indicated. 3. Small/trace bilateral effusions with mild diffuse body wall anasarca, nonspecific though could be seen in the setting of congestive heart failure. Clinical correlation is advised. 4. Extensive  colonic diverticulosis, primarily involving the sigmoid colon, without evidence of superimposed acute diverticulitis. 5. Large colonic stool burden without evidence of enteric obstruction. 6. Prostatomegaly with mass effect on the undersurface of the urinary bladder. If not recently performed, further evaluation with DRE is advised. 7. See quell a of previous granulomatous infection as above. 8.  Aortic Atherosclerosis (ICD10-I70.0). Electronically Signed   By: Sandi Mariscal M.D.   On: 07/24/2019 08:12   ECHOCARDIOGRAM COMPLETE  Result Date: 07/23/2019    ECHOCARDIOGRAM REPORT   Patient Name:   Neil Cordova Date of Exam: 07/23/2019 Medical Rec #:  [id number]     Height:       70.0 in Accession #:    [id number]    Weight:       160.8 lb Date of Birth:  10-22-1940     BSA:          1.902 m Patient Age:    24 years      BP:           145/76 mmHg Patient Gender: M             HR:           168 bpm. Exam Location:  Inpatient Procedure: 2D Echo Indications:    CHF-Acute Systolic 938.18 / E99.37  History:        Patient has prior history of Echocardiogram examinations, most                 recent  07/03/2019. Risk Factors:Hypertension.  Sonographer:    Mikki Santee RDCS (AE) Referring Phys: [place]  1. Left ventricular ejection fraction, by estimation, is 55 to 60%. The left ventricle has normal function. The left ventricle has no regional wall motion abnormalities. Left ventricular diastolic parameters are indeterminate.  2. Right ventricular systolic function is normal. The right ventricular size is normal.  3. Left atrial size was mildly dilated.  4. The mitral valve is normal in structure. Mild mitral valve regurgitation. No evidence of mitral stenosis.  5. The aortic valve has an indeterminant number of cusps. Aortic valve regurgitation is not visualized. Mild to moderate aortic valve sclerosis/calcification is present, without any evidence of aortic stenosis.  6. The inferior vena cava is normal in size with greater than 50% respiratory variability, suggesting right atrial pressure of 3 mmHg. FINDINGS  Left Ventricle: Left ventricular ejection fraction, by estimation, is 55 to 60%. The left ventricle has normal function. The left ventricle has no regional wall motion abnormalities. The left ventricular internal cavity size was normal in size. There is  no left ventricular hypertrophy. Left ventricular diastolic parameters are indeterminate. Normal left ventricular filling pressure. Right Ventricle: The right ventricular size is normal. No increase in right ventricular wall thickness. Right ventricular systolic function is normal. Left Atrium: Left atrial size was mildly dilated. Right Atrium: Right atrial size was normal in size. Pericardium: There is no evidence of pericardial effusion. Mitral Valve: The mitral valve is normal in structure. Normal mobility of the mitral valve leaflets. Mild mitral valve regurgitation. No evidence of mitral valve stenosis. Tricuspid Valve: The tricuspid valve is normal in structure. Tricuspid valve regurgitation is trivial. No evidence of  tricuspid stenosis. Aortic Valve: The aortic valve has an indeterminant number of cusps. . There is mild thickening and mild calcification of the aortic valve. Aortic valve regurgitation is not visualized. Mild to moderate aortic valve sclerosis/calcification is present, without any evidence of aortic stenosis. There is  mild thickening of the aortic valve. There is mild calcification of the aortic valve. Pulmonic Valve: The pulmonic valve was normal in structure. Pulmonic valve regurgitation is not visualized. No evidence of pulmonic stenosis. Aorta: The aortic root is normal in size and structure. Venous: The inferior vena cava is normal in size with greater than 50% respiratory variability, suggesting right atrial pressure of 3 mmHg. IAS/Shunts: No atrial level shunt detected by color flow Doppler.  LEFT VENTRICLE PLAX 2D LVIDd:         4.40 cm  Diastology LVIDs:         3.30 cm  LV e' lateral:   15.90 cm/s LV PW:         1.00 cm  LV E/e' lateral: 7.0 LV IVS:        0.90 cm  LV e' medial:    13.80 cm/s LVOT diam:     2.00 cm  LV E/e' medial:  8.1 LV SV:         73 LV SV Index:   38 LVOT Area:     3.14 cm  RIGHT VENTRICLE RV S prime:     10.80 cm/s TAPSE (M-mode): 1.7 cm LEFT ATRIUM             Index       RIGHT ATRIUM           Index LA diam:        4.20 cm 2.21 cm/m  RA Area:     17.00 cm LA Vol (A2C):   78.2 ml 41.10 ml/m RA Volume:   37.10 ml  19.50 ml/m LA Vol (A4C):   55.0 ml 28.91 ml/m LA Biplane Vol: 67.2 ml 35.32 ml/m  AORTIC VALVE LVOT Vmax:   132.00 cm/s LVOT Vmean:  77.600 cm/s LVOT VTI:    0.231 m  AORTA Ao Root diam: 3.60 cm MITRAL VALVE MV Area (PHT): 3.60 cm     SHUNTS MV Decel Time: 211 msec     Systemic VTI:  0.23 m MR Peak grad: 90.6 mmHg     Systemic Diam: 2.00 cm MR Mean grad: 58.0 mmHg MR Vmax:      476.00 cm/s MR Vmean:     350.0 cm/s MV E velocity: 112.00 cm/s MV A velocity: 60.20 cm/s MV E/A ratio:  1.86 Fransico Him MD Electronically signed by Fransico Him MD Signature Date/Time:  07/23/2019/3:42:48 PM    Final      Labs:   Basic Metabolic Panel: Recent Labs  Lab 07/24/19 0549 07/24/19 0549 07/25/19 0430 07/25/19 0430 07/26/19 0350 07/26/19 0350 07/27/19 0554 07/28/19 0837  NA 136  --  137  --  134*  --  137 138  K 3.4*   < > 3.8   < > 4.3   < > 4.2 3.9  CL 97*  --  96*  --  96*  --  96* 96*  CO2 28  --  32  --  31  --  30 31  GLUCOSE 110*  --  101*  --  158*  --  81 154*  BUN 10  --  12  --  18  --  20 14  CREATININE 1.09  --  1.04  --  1.25*  --  1.13 1.25*  CALCIUM 8.7*  --  8.7*  --  8.7*  --  9.1 9.4  MG  --   --  1.7  --   --   --   --   --  PHOS  --   --  3.3  --   --   --   --   --    < > = values in this interval not displayed.   GFR Estimated Creatinine Clearance: 42.4 mL/min (A) (by C-G formula based on SCr of 1.25 mg/dL (H)). Liver Function Tests: Recent Labs  Lab 07/22/19 1122  AST 13*  ALT <5  ALKPHOS 76  BILITOT 0.8  PROT 5.9*  ALBUMIN 2.1*   No results for input(s): LIPASE, AMYLASE in the last 168 hours. No results for input(s): AMMONIA in the last 168 hours. Coagulation profile Recent Labs  Lab 07/25/19 1029  INR 1.6*    CBC: Recent Labs  Lab 07/22/19 1122 07/25/19 0430  WBC 6.4 5.9  NEUTROABS 4.7 3.4  HGB 8.6* 8.5*  HCT 28.1* 27.1*  MCV 97.2 96.1  PLT 247 270   Cardiac Enzymes: No results for input(s): CKTOTAL, CKMB, CKMBINDEX, TROPONINI in the last 168 hours. BNP: Invalid input(s): POCBNP CBG: No results for input(s): GLUCAP in the last 168 hours. D-Dimer No results for input(s): DDIMER in the last 72 hours. Hgb A1c No results for input(s): HGBA1C in the last 72 hours. Lipid Profile No results for input(s): CHOL, HDL, LDLCALC, TRIG, CHOLHDL, LDLDIRECT in the last 72 hours. Thyroid function studies No results for input(s): TSH, T4TOTAL, T3FREE, THYROIDAB in the last 72 hours.  Invalid input(s): FREET3 Anemia work up No results for input(s): VITAMINB12, FOLATE, FERRITIN, TIBC, IRON, RETICCTPCT in  the last 72 hours. Microbiology Recent Results (from the past 240 hour(s))  SARS Coronavirus 2 by RT PCR (hospital order, performed in [facility] hospital lab) Nasopharyngeal Nasopharyngeal Swab     Status: None   Collection Time: 07/22/19  5:39 PM   Specimen: Nasopharyngeal Swab  Result Value Ref Range Status   SARS Coronavirus 2 NEGATIVE NEGATIVE Final    Comment: (NOTE) SARS-CoV-2 target nucleic acids are NOT DETECTED.  The SARS-CoV-2 RNA is generally detectable in upper and lower respiratory specimens during the acute phase of infection. The lowest concentration of SARS-CoV-2 viral copies this assay can detect is 250 copies / mL. A negative result does not preclude SARS-CoV-2 infection and should not be used as the sole basis for treatment or other patient management decisions.  A negative result may occur with improper specimen collection / handling, submission of specimen other than nasopharyngeal swab, presence of viral mutation(s) within the areas targeted by this assay, and inadequate number of viral copies (<250 copies / mL). A negative result must be combined with clinical observations, patient history, and epidemiological information.  Fact Sheet for Patients:   StrictlyIdeas.no  Fact Sheet for Healthcare Providers: BankingDealers.co.za  This test is not yet approved or  cleared by the [place] FDA and has been authorized for detection and/or diagnosis of SARS-CoV-2 by FDA under an Emergency Use Authorization (EUA).  This EUA will remain in effect (meaning this test can be used) for the duration of the COVID-19 declaration under Section 564(b)(1) of the Act, 21 U.S.C. section 360bbb-3(b)(1), unless the authorization is terminated or revoked sooner.  Performed at [facility], [place] [address] [place], [place] [address]     Please note: You were cared for by a hospitalist during your hospital stay. Once you  are discharged, your primary care physician will handle any further medical issues. Please note that NO REFILLS for any discharge medications will be authorized once you are discharged, as it is imperative that you return  to your primary care physician (or establish a relationship with a primary care physician if you do not have one) for your post hospital discharge needs so that they can reassess your need for medications and monitor your lab values.  Signed: Marlowe Aschoff Antionne Enrique  [facility] 07/28/2019, 11:05 AM

## 2019-07-28 NOTE — Discharge Instructions (Signed)

## 2019-07-29 ENCOUNTER — Telehealth: Payer: Self-pay | Admitting: Student

## 2019-07-29 NOTE — Telephone Encounter (Signed)
New Message   Patient is calling in to get wife approved to accompany him to his appointment states that he needs wife there because she helps him in and out of the car and helps with walking. Please give patient a call back to confirm.

## 2019-07-30 ENCOUNTER — Other Ambulatory Visit: Payer: Self-pay

## 2019-07-30 ENCOUNTER — Ambulatory Visit (INDEPENDENT_AMBULATORY_CARE_PROVIDER_SITE_OTHER): Payer: Medicare Other | Admitting: Internal Medicine

## 2019-07-30 ENCOUNTER — Encounter: Payer: Self-pay | Admitting: Internal Medicine

## 2019-07-30 VITALS — BP 104/59 | HR 61 | Temp 97.9°F | Wt 140.6 lb

## 2019-07-30 DIAGNOSIS — G062 Extradural and subdural abscess, unspecified: Secondary | ICD-10-CM | POA: Diagnosis not present

## 2019-07-30 DIAGNOSIS — T8149XA Infection following a procedure, other surgical site, initial encounter: Secondary | ICD-10-CM

## 2019-07-30 MED ORDER — CEPHALEXIN 500 MG PO CAPS: 500.0000 mg | ORAL_CAPSULE | Freq: Four times a day (QID) | ORAL | 3 refills | Status: DC

## 2019-07-30 NOTE — Progress Notes (Signed)
Patient ID: Neil Cordova, male   DOB: [birth year], 79 y.o.   MRN: [id number]  HPI Neil Cordova is a 79 y.o. 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 Course further complicated by CHF, and afib/flutter requiring cardioversion. He was to be on nafcillin through 7/11.  Specifics:  5/10= patient underwent L4-L5 microdiscectomyon 5/10/2021by Dr. Kathyrn Sheriff 5/24 - 6/3= patient was hospitalized with acute metabolic encephalopathy with delirium. He was found to have MSSA bacteremia in the setting of surgical wound infection/ventral abscess. He underwent repeat surgery cultures from the abscess which grew MSSA as well. He was sent home with intravenous antibiotics (nafcillin) via a PICC line for 6 weeks with end date 7/11. During that hospital stay he was also diagnosed with new onset a flutter, he was rate controlled on metoprolol. He was placed on Eliquis by cardiology. 2D echo was done showed an EF of 50-55%, no WMA however subsequent TEE to look for endocarditis actually showed a low EF of 30-35% with moderate to severe LV dysfunction. Apparently at discharge, he was not sent on cardiac medications. Post discharge, patient started having progressive lower extremity swelling from the waist down and gained about 30 pounds. His legs became painful and red.  Also, when he was discharged did not have any antibiotics at home for at least 36 hours and he was quite upset about that.   6/21 =Patient was admitted under hospitalist service for CHF exacerbation. Cardiology consultation was obtained. Patient was aggressively diuresed, 9.3 L since admission. Pedal edema significantly improved. abtx changed to cefazolin to minimize salt load and finish out course on 7/11  Outpatient Encounter Medications as of 07/30/2019  Medication Sig  . acetaminophen (TYLENOL) 325 MG tablet  Take 650 mg by mouth every 6 (six) hours as needed for mild pain, moderate pain or headache.   . ALPRAZolam (XANAX) 1 MG tablet Take 1 mg by mouth at bedtime.  Marland Kitchen apixaban (ELIQUIS) 5 MG TABS tablet Take 1 tablet (5 mg total) by mouth 2 (two) times daily.  Marland Kitchen ceFAZolin (ANCEF) 2-4 GM/100ML-% IVPB Inject 100 mLs (2 g total) into the vein every 8 (eight) hours for 20 days.  Marland Kitchen ceFAZolin (ANCEF) IVPB Inject 2 g into the vein every 8 (eight) hours for 20 days. Indication:  MSSA bacteremia/discitis First Dose: Yes Last Day of Therapy:  08/17/19 Labs - Once weekly:  CBC/D and BMP, Labs - Every other week:  ESR and CRP Method of administration: IV Push Method of administration may be changed at the discretion of home infusion pharmacist based upon assessment of the patient and/or caregiver's ability to self-administer the medication ordered.  . fluticasone (FLONASE) 50 MCG/ACT nasal spray Place 1 spray into both nostrils daily as needed for allergies or rhinitis.  Marland Kitchen lisinopril (ZESTRIL) 10 MG tablet Take 10 mg by mouth at bedtime.   Marland Kitchen loratadine (CLARITIN) 10 MG tablet Take 10 mg by mouth daily as needed for allergies.  . metoprolol succinate (TOPROL-XL) 25 MG 24 hr tablet Take 0.5 tablets (12.5 mg total) by mouth daily.  Marland Kitchen oxyCODONE-acetaminophen (PERCOCET) 7.5-325 MG tablet Take 0.5-1 tablets by mouth every 4 (four) hours as needed for severe pain.   Marland Kitchen PARoxetine (PAXIL) 30 MG tablet Take 30 mg by mouth every evening.  . polyethylene glycol powder (GLYCOLAX/MIRALAX) 17 GM/SCOOP powder Take 17 g by mouth daily as needed for mild constipation.  . Probiotic Product (PROBIOTIC DAILY)  CAPS Take 1 capsule by mouth daily.  . psyllium (METAMUCIL) 58.6 % packet Take 1 packet by mouth See admin instructions. Mix and drink 1 packet by mouth every other night  . Turmeric 500 MG TABS Take 500 mg by mouth daily.   No facility-administered encounter medications on file as of 07/30/2019.     Patient Active Problem  List   Diagnosis Date Noted  . Iron deficiency anemia due to chronic blood loss 07/28/2019  . Acute systolic CHF (congestive heart failure) ([place]) 07/22/2019  . Abscess   . SIRS (systemic inflammatory response syndrome) ([facility])   . Epidural abscess   . Acute combined systolic and diastolic congestive heart failure ([place])   . Bacteremia   . Acute metabolic encephalopathy 93/90/3009  . Delirium 07/03/2019  . Unspecified atrial fibrillation ([place]) 07/03/2019  . Atrial flutter ([place]) 07/03/2019  . Pressure injury of skin 07/01/2019  . Wound infection after surgery 06/30/2019  . Hyponatremia 06/30/2019     Health Maintenance Due  Topic Date Due  . Hepatitis C Screening  Never done  . COVID-19 Vaccine (1) Never done  . TETANUS/TDAP  Never done  . PNA vac Low Risk Adult (1 of 2 - PCV13) Never done     Review of Systems No fevers, chills, nightsweats. Has some low back pain at baseline Physical Exam   BP (!) 104/59   Pulse 61   Temp 97.9 F (36.6 C) (Oral)   Wt 140 lb 9.6 oz (63.8 kg)   BMI 20.17 kg/m   Gen= a xo by 3 in nad Ext = picc line is c/d/i, no erythema  CBC Lab Results  Component Value Date   WBC 5.9 07/25/2019   RBC 2.82 (L) 07/25/2019   HGB 8.5 (L) 07/25/2019   HCT 27.1 (L) 07/25/2019   PLT 270 07/25/2019   MCV 96.1 07/25/2019   MCH 30.1 07/25/2019   MCHC 31.4 07/25/2019   RDW 15.2 07/25/2019   LYMPHSABS 1.4 07/25/2019   MONOABS 0.6 07/25/2019   EOSABS 0.3 07/25/2019    BMET Lab Results  Component Value Date   NA 138 07/28/2019   K 3.9 07/28/2019   CL 96 (L) 07/28/2019   CO2 31 07/28/2019   GLUCOSE 154 (H) 07/28/2019   BUN 14 07/28/2019   CREATININE 1.25 (H) 07/28/2019   CALCIUM 9.4 07/28/2019   GFRNONAA 55 (L) 07/28/2019   GFRAA >60 07/28/2019      Assessment and Plan  MSSA epidural abscess/discitis following microdiscectomy = continue on cefazolin through 7/11. Will continue with weekly labs through home health and iv abtx protocol. Plan to  change to oral abtx for chronic suppression and likely repeat imaging to see improvement from infection. Labs checked through home health also follows sed rate and crp  Atrial fibrillation = defer to cardiology for management  Pedal edema = improving.

## 2019-07-31 ENCOUNTER — Encounter: Payer: Self-pay | Admitting: Family Medicine

## 2019-07-31 ENCOUNTER — Ambulatory Visit (INDEPENDENT_AMBULATORY_CARE_PROVIDER_SITE_OTHER): Payer: Medicare Other | Admitting: Family Medicine

## 2019-07-31 VITALS — BP 116/68 | HR 57 | Temp 97.3°F | Ht 67.0 in | Wt 138.4 lb

## 2019-07-31 DIAGNOSIS — F321 Major depressive disorder, single episode, moderate: Secondary | ICD-10-CM | POA: Diagnosis not present

## 2019-07-31 DIAGNOSIS — D649 Anemia, unspecified: Secondary | ICD-10-CM | POA: Diagnosis not present

## 2019-07-31 DIAGNOSIS — G062 Extradural and subdural abscess, unspecified: Secondary | ICD-10-CM | POA: Diagnosis not present

## 2019-07-31 DIAGNOSIS — R7303 Prediabetes: Secondary | ICD-10-CM | POA: Diagnosis not present

## 2019-07-31 DIAGNOSIS — E611 Iron deficiency: Secondary | ICD-10-CM

## 2019-07-31 DIAGNOSIS — G4701 Insomnia due to medical condition: Secondary | ICD-10-CM

## 2019-07-31 MED ORDER — MIRTAZAPINE 7.5 MG PO TABS: 7.5000 mg | ORAL_TABLET | Freq: Every day | ORAL | 1 refills | Status: DC

## 2019-07-31 MED ORDER — QUETIAPINE FUMARATE 50 MG PO TABS: 50.0000 mg | ORAL_TABLET | Freq: Every day | ORAL | 1 refills | Status: DC

## 2019-07-31 MED ORDER — IRON (FERROUS GLUCONATE) 256 (28 FE) MG PO TABS: ORAL_TABLET | ORAL | 3 refills | Status: DC

## 2019-07-31 NOTE — Progress Notes (Signed)
Cardiology Office Note:    Date:  08/01/2019   ID:  Neil Cordova, DOB 1940/04/21, MRN [id number]  PCP:  Libby Maw, MD  Cardiologist:  Buford Dresser, MD  Electrophysiologist:  None   Referring MD: No ref. provider found   Chief Complaint: hospital follow-up for CHF and atrial flutter  History of Present Illness:    Neil Cordova is a 79 y.o. male with a history of persistent atrial flutter on Eliquis, chronic combined CHF, hypertension, and recent left lumbar L4-L5 microdiscectomy complicated by wound infection with sepsis and bacteremia who is followed by Dr. Harrell Gave and presents today for hospital follow-up for CHF.   Patient had no known cardiac problems until recently. He underwent left L4-L5 microdiscectomy by Dr. Kathyrn Sheriff on 06/17/2019. He was then admitted on 06/30/2019 with sepsis secondary to lumbar wound infection after presenting with progressive weakness, confusion, and fever of 102.8. Blood cultures came back positive for MSSA. He was started on IV antibiotics and underwent washout on 5/25. He was also underwent IR guided aspiration with placement of drain on 5/29 Echo showed LVEF of 50-55% with normal wall motion. However, TEE showed LVEF of 35-40% with global hypokinesis and mild MR. Cardiology was consulted for atrial flutter with variable AV conduction. Patient denied any cardiac symptoms at the time and reported being very active prior to his herniated disc running 3-6 miles multiple times per week without any problems. Patient was started on Metoprolol and Eliquis after all procedures were completed. He was already on Lisinopril at home for hypertension so this was continued given low EF. He was rate controlled at the time. Therefore, plan was to follow-up as an outpatient and consider DCCV if he was still in atrial flutter and consider ischemic evaluation if EF still low on repeat Echo. He was discharged on 07/10/2019 with home health OT/PT/RN. Of note, it  does not look like he was discharged on Metoprolol.   Shortly after being discharged, he started gain weight (ultimately gained 30 lbs) and develop significant lower extremity edema. He was readmitted from 07/22/2019 to 07/28/2019 for acute CHF. He was still in rate controlled atrial flutter on presentation. He was diuresed with IV Lasix and started on Metoprolol. Repeat TTE on 6/16/ showed LVEF of 55-60% with normal wall motion. Patient underwent TEE/DCCV on 6/18 and was successful converted back to normal sinus rhythm. Of note, TEE also showed LVEF of 55-60% (reduced EF on TEE from 06/2019 may have been just secondary to sepsis). Prior to cardioversion, he was noted to have transient bradycardia with rates dropping as low as the 30's; therefore Metoprolol was held. However, post-cardioversion she was noted to have brief episode of ectopic atrial tachycardia. Therefore, she was restarted on low dose Toprol-XL prior to discharge with plans for Event Monitor after complete resolution of infection. Patient was diuresed 9.3L during admission and discharge weight was 135.8 lbs. He was not felt to need any more diuretics at discharge.   Patient presents today for follow-up. Here with wife, Neil Cordova. Patient doing well since being discharge. Lower extremity swelling has completely resolved. No chest pain, shortness of breath, orthopnea, PND, palpitations, lightheadedness, dizziness, or syncope. He has noticed a little pink/red tinged drainage from drain in lower back. No other abnormal bleeding on Eliquis.  Past Medical History:  Diagnosis Date  . Anxiety   . Hypertension     Past Surgical History:  Procedure Laterality Date  . BACK SURGERY    . CARDIOVERSION N/A 07/25/2019   Procedure:  CARDIOVERSION;  Surgeon: Josue Hector, MD;  Location: [facility] ENDOSCOPY;  Service: Cardiovascular;  Laterality: N/A;  . IR US GUIDE BX ASP/DRAIN  07/05/2019  . LUMBAR WOUND DEBRIDEMENT N/A 07/01/2019   Procedure: LUMBAR WOUND  EXPLORATION;  Surgeon: Consuella Lose, MD;  Location: [place];  Service: Neurosurgery;  Laterality: N/A;  posterior  . TEE WITHOUT CARDIOVERSION N/A 07/04/2019   Procedure: TRANSESOPHAGEAL ECHOCARDIOGRAM (TEE);  Surgeon: Acie Fredrickson Wonda Cheng, MD;  Location: [place];  Service: Cardiovascular;  Laterality: N/A;  . TEE WITHOUT CARDIOVERSION N/A 07/25/2019   Procedure: TRANSESOPHAGEAL ECHOCARDIOGRAM (TEE);  Surgeon: Josue Hector, MD;  Location: [facility] ENDOSCOPY;  Service: Cardiovascular;  Laterality: N/A;    Current Medications: Current Meds  Medication Sig  . acetaminophen (TYLENOL) 325 MG tablet Take 650 mg by mouth every 6 (six) hours as needed for mild pain, moderate pain or headache.   Marland Kitchen apixaban (ELIQUIS) 5 MG TABS tablet Take 1 tablet (5 mg total) by mouth 2 (two) times daily.  Marland Kitchen ceFAZolin (ANCEF) 2-4 GM/100ML-% IVPB Inject 100 mLs (2 g total) into the vein every 8 (eight) hours for 20 days.  Marland Kitchen ceFAZolin (ANCEF) IVPB Inject 2 g into the vein every 8 (eight) hours for 20 days. Indication:  MSSA bacteremia/discitis First Dose: Yes Last Day of Therapy:  08/17/19 Labs - Once weekly:  CBC/D and BMP, Labs - Every other week:  ESR and CRP Method of administration: IV Push Method of administration may be changed at the discretion of home infusion pharmacist based upon assessment of the patient and/or caregiver's ability to self-administer the medication ordered.  . cephALEXin (KEFLEX) 500 MG capsule Take 1 capsule (500 mg total) by mouth 4 (four) times daily.  . fluticasone (FLONASE) 50 MCG/ACT nasal spray Place 1 spray into both nostrils daily as needed for allergies or rhinitis.  . Iron, Ferrous Gluconate, 256 (28 Fe) MG TABS Take one daily  . lisinopril (ZESTRIL) 10 MG tablet Take 10 mg by mouth at bedtime.   Marland Kitchen loratadine (CLARITIN) 10 MG tablet Take 10 mg by mouth daily as needed for allergies.  . metoprolol succinate (TOPROL-XL) 25 MG 24 hr tablet Take 0.5 tablets (12.5 mg total) by mouth  daily.  Marland Kitchen PARoxetine (PAXIL) 30 MG tablet Take 30 mg by mouth every evening.  . polyethylene glycol powder (GLYCOLAX/MIRALAX) 17 GM/SCOOP powder Take 17 g by mouth daily as needed for mild constipation.   . Probiotic Product (PROBIOTIC DAILY) CAPS Take 1 capsule by mouth daily.  . psyllium (METAMUCIL) 58.6 % packet Take 1 packet by mouth See admin instructions. Mix and drink 1 packet by mouth every other night  . QUEtiapine (SEROQUEL) 50 MG tablet Take 1 tablet (50 mg total) by mouth at bedtime.     Allergies:   Patient has no known allergies.   Social History   Socioeconomic History  . Marital status: Married    Spouse name: Not on file  . Number of children: Not on file  . Years of education: Not on file  . Highest education level: Not on file  Occupational History  . Not on file  Tobacco Use  . Smoking status: Former Smoker    Quit date: 1972    Years since quitting: 49.5  . Smokeless tobacco: Current User    Types: Snuff  Vaping Use  . Vaping Use: Never used  Substance and Sexual Activity  . Alcohol use: Not Currently  . Drug use: Never  . Sexual activity: Not on file  Other Topics Concern  . Not on file  Social History Narrative  . Not on file   Social Determinants of Health   Financial Resource Strain: Low Risk   . Difficulty of Paying Living Expenses: Not hard at all  Food Insecurity: No Food Insecurity  . Worried About Charity fundraiser in the Last Year: Never true  . Ran Out of Food in the Last Year: Never true  Transportation Needs: No Transportation Needs  . Lack of Transportation (Medical): No  . Lack of Transportation (Non-Medical): No  Physical Activity: Sufficiently Active  . Days of Exercise per Week: 5 days  . Minutes of Exercise per Session: 60 min  Stress:   . Feeling of Stress :   Social Connections:   . Frequency of Communication with Friends and Family:   . Frequency of Social Gatherings with Friends and Family:   . Attends Religious  Services:   . Active Member of Clubs or Organizations:   . Attends Archivist Meetings:   Marland Kitchen Marital Status:      Family History: The patient's family history includes Emphysema in his father. There is no history of CAD or Heart failure.  ROS:   Please see the history of present illness.     EKGs/Labs/Other Studies Reviewed:    The following studies were reviewed today:  TTE 07/23/2019: Impressions: 1. Left ventricular ejection fraction, by estimation, is 55 to 60%. The  left ventricle has normal function. The left ventricle has no regional  wall motion abnormalities. Left ventricular diastolic parameters are  indeterminate.  2. Right ventricular systolic function is normal. The right ventricular  size is normal.  3. Left atrial size was mildly dilated.  4. The mitral valve is normal in structure. Mild mitral valve  regurgitation. No evidence of mitral stenosis.  5. The aortic valve has an indeterminant number of cusps. Aortic valve  regurgitation is not visualized. Mild to moderate aortic valve  sclerosis/calcification is present, without any evidence of aortic  stenosis.  6. The inferior vena cava is normal in size with greater than 50%  respiratory variability, suggesting right atrial pressure of 3 mmHg. _______________  TEE 07/25/2019: Impressions: 1. Anesthesia/Cardiology unable to pass scope with propofol Patient  electively intubated and Dr Daiva Huge passed probe using glide scope and  direct visualization.  2. [place] x 1 150J patient converted to NSR from flutter at rate of 64 bpm.  3. Left ventricular ejection fraction, by estimation, is 55 to 60%. The  left ventricle has normal function. The left ventricle has no regional  wall motion abnormalities. Left ventricular diastolic function could not  be evaluated.  4. Right ventricular systolic function is normal. The right ventricular  size is normal.  5. Left atrial size was moderately dilated. No left  atrial/left atrial  appendage thrombus was detected.  6. Right atrial size was moderately dilated.  7. The mitral valve is normal in structure. Mild mitral valve  regurgitation.  8. The aortic valve is tricuspid. Aortic valve regurgitation is not  visualized. No aortic stenosis is present.  9. PFO present. There is a small patent foramen ovale with predominantly  left to right shunting across the atrial septum.   EKG:  EKG ordered today. EKG personally reviewed and demonstrates sinus bradycardia, rate 54 bpm, with 1st degree AV block and 1 dropped beat (unable to tell if isolated episode or 2nd degree Mobitz type 2). Mild T wave inversion in inferior leads and biphasic T waves  in V5-V6. Left axis deviation. QTc 3107m.  Recent Labs: 07/04/2019: TSH 1.052 07/22/2019: ALT <5; B Natriuretic Peptide 559.2 07/25/2019: Hemoglobin 8.5; Magnesium 1.7; Platelets 270 07/28/2019: BUN 14; Creatinine, Ser 1.25; Potassium 3.9; Sodium 138  Recent Lipid Panel No results found for: CHOL, TRIG, HDL, CHOLHDL, VLDL, LDLCALC, LDLDIRECT  Physical Exam:    Vital Signs: BP (!) 106/50   Pulse 63   Ht 5' 10"  (1.778 m)   Wt 140 lb 6.4 oz (63.7 kg)   SpO2 100%   BMI 20.15 kg/m     Wt Readings from Last 3 Encounters:  08/01/19 140 lb 6.4 oz (63.7 kg)  07/31/19 138 lb 6.4 oz (62.8 kg)  07/30/19 140 lb 9.6 oz (63.8 kg)     General: 79y.o. male in no acute distress. HEENT: Normocephalic and atraumatic. Sclera clear.  Neck: Supple. No carotid bruits. No JVD. Heart: Mild bradycardia with normal rhythm. Distinct S1 and S2. No murmurs, gallops, or rubs. Radial pulses 2+ and equal bilaterally. Lungs: No increased work of breathing. Clear to ausculation bilaterally. No wheezes, rhonchi, or rales.  Abdomen: Soft, non-distended, and non-tender to palpation.  Extremities: No lower extremity edema.    Skin: Warm and dry. Hyperpigmentation/mild erythema of bilateral lower extremities. Neuro: Alert and oriented x3.  No focal deficits. Psych: Normal affect. Responds appropriately.  Assessment:    1. Atrial flutter, unspecified type ([place]   2. Chronic combined systolic and diastolic CHF (congestive heart failure) ([place]   3. Essential hypertension   4. PFO (patent foramen ovale)   5. Anemia, unspecified type   6. Stage 3 chronic kidney disease, unspecified whether stage 3a or 3b CKD     Plan:    Persistent Atrial Flutter - S/p successful TEE/DCCV on 6/18.  - Maintaining sinus rhythm. EKG showed showed sinus bradycardia, rate 54 bpm, with one dropped beat. Unclear whether this is isolated dropped beat or 2nd degree type 2 AV block (Mobtiz type II). - Continue Toprol-XL 12.516mdaily for now given episode of atrial tachycardia in the hospital. - Continue Eliquis 26m36mwice daily. - Given multiple episode of ectopic atrial arrhythmia following DCCV in the hospital, will order 30-Day Event Monitor for patient to start after he has completed IV antibiotics on 08/17/2019. This will also help us Koreasess whether he has a high grade AV block.  Chronic Combined CHF - Recently admitted acute on chronic CHF likely exacerbated by persistent atrial flutter.  - LVEF of 55-60% on TTE on 07/23/2019 and TEE on 07/25/2019 (previously 35-40% on TEE in 06/2019 likely secondary to sepsis/bacteremia). Can consider ischemic evaluation once patient has recovered from sepsis. - Appear euvolemic on exam. - Will prescribe Lasix 71m50mily PRN for weight gain (3lbs in 1 day or 5lbs in 1 week) or worsening lower extremity edema. Will also give potassium chloride 20mE13m take PRN when taking the Lasix. - Continue Toprol-XL 12.26mg d30my and Lisinopril 10mg d226m.   - Will recheck BMET today.   Hypertension - BP well controlled. - Continue Toprol-XL and Lisinopril as above.   PFO - Small PFO with predominately left to right shunting across the atrial septum noted on TEE on 07/25/2019.  - Did not discuss at visit today.    Anemia -  Hemoglobin 8.5 on 07/25/2019. - He has noticed some pink/red tinged drainage from drain in back.  - Will recheck CBC to make sure this is stable. - PCP has already referred him to GI.  CKD Stage III - Creatinine  1.25 on discharge. - Will recheck BMET today.  Disposition: Follow up in 3 months with Dr. Harrell Gave after monitor.   Medication Adjustments/Labs and Tests Ordered: Current medicines are reviewed at length with the patient today.  Concerns regarding medicines are outlined above.  Orders Placed This Encounter  Procedures  . Basic metabolic panel  . CBC  . CARDIAC EVENT MONITOR  . EKG 12-Lead   Meds ordered this encounter  Medications  . furosemide (LASIX) 40 MG tablet    Sig: Take 1 tablet (40 mg total) by mouth daily as needed for edema (weight gain of 3lbs in 24 hours or 5lbs in 1 week).    Dispense:  30 tablet    Refill:  3  . potassium chloride SA (KLOR-CON) 20 MEQ tablet    Sig: Take 1 tablet (20 mEq total) by mouth daily as needed (take only w/lasix).    Dispense:  30 tablet    Refill:  3    Patient Instructions  Medication Instructions:  Djuana Littleton PA has prescribed lasix 38m to use as needed for swelling and/or weight gain of 3lbs in 24 hours or 5lbs in 1 week If you take lasix, please take potassium 228m along with it  *If you need a refill on your cardiac medications before your next appointment, please call your pharmacy*   Lab Work: CBC, BMET today   If you have labs (blood work) drawn today and your tests are completely normal, you will receive your results only by: . Marland KitchenyChart Message (if you have MyChart) OR . A paper copy in the mail If you have any lab test that is abnormal or we need to change your treatment, we will call you to review the results.   Testing/Procedures: 30 day event monitor - will be mailed to your home * start once you complete IV antibiotics   Follow-Up: [facility].  As  part of our continuing mission to provide you with exceptional heart care, we have created designated Provider Care Teams.  These Care Teams include your primary Cardiologist (physician) and Advanced Practice Providers (APPs -  Physician Assistants and Nurse Practitioners) who all work together to provide you with the care you need, when you need it.  We recommend signing up for the patient portal called "MyChart".  Sign up information is provided on this After Visit Summary.  MyChart is used to connect with patients for Virtual Visits (Telemedicine).  Patients are able to view lab/test results, encounter notes, upcoming appointments, etc.  Non-urgent messages can be sent to your provider as well.   To learn more about what you can do with MyChart, go to [email or web address]   Your next appointment:   3 month(s)  The format for your next appointment:   In Person  Provider:   BrBuford DresserMD   Other Instructions      Signed, CaDarreld McleanPA-C  08/01/2019 5:43 PM    [place]

## 2019-07-31 NOTE — Progress Notes (Signed)
New Patient Office Visit  Subjective:  Patient ID: Neil Cordova, male    DOB: 12/08/1940  Age: 79 y.o. MRN: [id number]  CC:  Chief Complaint  Patient presents with  . Establish Care    New patient, establish care back surgery 06/17/19    HPI Neil Cordova presents for establishment of care.  He is accompanied by his wife today.  Significant recent past medical history of epidural abscess status post lumbar laminectomy surgery this past month on the 11th.  He currently has a drain for the epidural space of his lower back in place.  Wife is administering 2 g of Ancef every 8 hours for 20 days.  He lives between here in [place].  History of prediabetes that has been treated with weight loss and exercise.  Hemoglobin A1c's in [place] had been in the less than 6 range.  Recently checked here during his hospitalization at 6.5.  He is treated for depression with Paxil and as needed Xanax at night.  Has not been sleeping well at all.  History of atrial fibrillation.  Follow-up with cardiology tomorrow.  Recent hemoglobin on 621 was 8.5.  MCV was 92.  B12 was elevated folate was normal iron levels were low. He was an avid runner prior to his surgery but hasn't been able to run since.   Past Medical History:  Diagnosis Date  . Anxiety   . Hypertension     Past Surgical History:  Procedure Laterality Date  . BACK SURGERY    . CARDIOVERSION N/A 07/25/2019   Procedure: CARDIOVERSION;  Surgeon: Josue Hector, MD;  Location: [facility] ENDOSCOPY;  Service: Cardiovascular;  Laterality: N/A;  . IR US GUIDE BX ASP/DRAIN  07/05/2019  . LUMBAR WOUND DEBRIDEMENT N/A 07/01/2019   Procedure: LUMBAR WOUND EXPLORATION;  Surgeon: Consuella Lose, MD;  Location: [facility];  Service: Neurosurgery;  Laterality: N/A;  posterior  . TEE WITHOUT CARDIOVERSION N/A 07/04/2019   Procedure: TRANSESOPHAGEAL ECHOCARDIOGRAM (TEE);  Surgeon: Acie Fredrickson Wonda Cheng, MD;  Location: [facility];  Service: Cardiovascular;   Laterality: N/A;  . TEE WITHOUT CARDIOVERSION N/A 07/25/2019   Procedure: TRANSESOPHAGEAL ECHOCARDIOGRAM (TEE);  Surgeon: Josue Hector, MD;  Location: [facility] ENDOSCOPY;  Service: Cardiovascular;  Laterality: N/A;    Family History  Problem Relation Age of Onset  . Emphysema Father   . CAD Neg Hx   . Heart failure Neg Hx     Social History   Socioeconomic History  . Marital status: Married    Spouse name: Not on file  . Number of children: Not on file  . Years of education: Not on file  . Highest education level: Not on file  Occupational History  . Not on file  Tobacco Use  . Smoking status: Former Smoker    Quit date: 1972    Years since quitting: 49.5  . Smokeless tobacco: Current User    Types: Snuff  Vaping Use  . Vaping Use: Never used  Substance and Sexual Activity  . Alcohol use: Not Currently  . Drug use: Never  . Sexual activity: Not on file  Other Topics Concern  . Not on file  Social History Narrative  . Not on file   Social Determinants of Health   Financial Resource Strain: Low Risk   . Difficulty of Paying Living Expenses: Not hard at all  Food Insecurity: No Food Insecurity  . Worried About Charity fundraiser in the Last Year: Never true  . Ran Out of  Food in the Last Year: Never true  Transportation Needs: No Transportation Needs  . Lack of Transportation (Medical): No  . Lack of Transportation (Non-Medical): No  Physical Activity: Sufficiently Active  . Days of Exercise per Week: 5 days  . Minutes of Exercise per Session: 60 min  Stress:   . Feeling of Stress :   Social Connections:   . Frequency of Communication with Friends and Family:   . Frequency of Social Gatherings with Friends and Family:   . Attends Religious Services:   . Active Member of Clubs or Organizations:   . Attends Archivist Meetings:   Marland Kitchen Marital Status:   Intimate Partner Violence:   . Fear of Current or Ex-Partner:   . Emotionally Abused:   Marland Kitchen Physically  Abused:   . Sexually Abused:     ROS Review of Systems  Constitutional: Negative.   HENT: Negative.   Eyes: Negative for photophobia and visual disturbance.  Respiratory: Negative.   Cardiovascular: Negative.   Gastrointestinal: Negative.   Endocrine: Negative for polyphagia and polyuria.  Genitourinary: Negative.   Musculoskeletal: Positive for back pain.  Allergic/Immunologic: Negative for immunocompromised state.  Neurological: Negative for light-headedness and numbness.  Hematological: Does not bruise/bleed easily.   Depression screen [facility] 2/9 07/31/2019  Decreased Interest 2  Down, Depressed, Hopeless 1  PHQ - 2 Score 3  Altered sleeping 3  Tired, decreased energy 3  Change in appetite 0  Feeling bad or failure about yourself  0  Trouble concentrating 0  Moving slowly or fidgety/restless 0  Suicidal thoughts 0  PHQ-9 Score 9  Difficult doing work/chores Extremely dIfficult    Objective:   Today's Vitals: BP 116/68   Pulse (!) 57   Temp (!) 97.3 F (36.3 C) (Tympanic)   Ht 5' 7"  (1.702 m)   Wt 138 lb 6.4 oz (62.8 kg)   SpO2 97%   BMI 21.68 kg/m   Physical Exam Vitals and nursing note reviewed.  Constitutional:      General: He is not in acute distress.    Appearance: Normal appearance. He is normal weight. He is not ill-appearing, toxic-appearing or diaphoretic.  HENT:     Head: Normocephalic.     Right Ear: External ear normal.     Left Ear: External ear normal.  Eyes:     General: No scleral icterus.       Right eye: No discharge.        Left eye: No discharge.     Extraocular Movements: Extraocular movements intact.     Conjunctiva/sclera: Conjunctivae normal.     Pupils: Pupils are equal, round, and reactive to light.  Cardiovascular:     Rate and Rhythm: Normal rate and regular rhythm.  Pulmonary:     Effort: Pulmonary effort is normal.     Breath sounds: Normal breath sounds.  Abdominal:     General: Abdomen is flat. Bowel sounds are normal.  There is no distension.     Palpations: Abdomen is soft. There is no mass.     Tenderness: There is no abdominal tenderness. There is no guarding or rebound.     Hernia: No hernia is present.  Skin:    General: Skin is warm and dry.       Neurological:     Mental Status: He is alert and oriented to person, place, and time.  Psychiatric:        Mood and Affect: Mood normal.  Behavior: Behavior normal.     Assessment & Plan:   Problem List Items Addressed This Visit      Nervous and Auditory   Epidural abscess     Other   Anemia - Primary   Relevant Medications   Iron, Ferrous Gluconate, 256 (28 Fe) MG TABS   Other Relevant Orders   Erythropoietin   Insomnia due to medical condition   Relevant Medications   QUEtiapine (SEROQUEL) 50 MG tablet   Depression, major, single episode, moderate (HCC)   Relevant Medications   QUEtiapine (SEROQUEL) 50 MG tablet   Prediabetes    Other Visit Diagnoses    Iron deficiency       Relevant Medications   Iron, Ferrous Gluconate, 256 (28 Fe) MG TABS      Outpatient Encounter Medications as of 07/31/2019  Medication Sig  . acetaminophen (TYLENOL) 325 MG tablet Take 650 mg by mouth every 6 (six) hours as needed for mild pain, moderate pain or headache.   . ALPRAZolam (XANAX) 1 MG tablet Take 1 mg by mouth at bedtime.  Marland Kitchen apixaban (ELIQUIS) 5 MG TABS tablet Take 1 tablet (5 mg total) by mouth 2 (two) times daily.  Marland Kitchen ceFAZolin (ANCEF) 2-4 GM/100ML-% IVPB Inject 100 mLs (2 g total) into the vein every 8 (eight) hours for 20 days.  Marland Kitchen ceFAZolin (ANCEF) IVPB Inject 2 g into the vein every 8 (eight) hours for 20 days. Indication:  MSSA bacteremia/discitis First Dose: Yes Last Day of Therapy:  08/17/19 Labs - Once weekly:  CBC/D and BMP, Labs - Every other week:  ESR and CRP Method of administration: IV Push Method of administration may be changed at the discretion of home infusion pharmacist based upon assessment of the patient and/or  caregiver's ability to self-administer the medication ordered.  . fluticasone (FLONASE) 50 MCG/ACT nasal spray Place 1 spray into both nostrils daily as needed for allergies or rhinitis.  Marland Kitchen lisinopril (ZESTRIL) 10 MG tablet Take 10 mg by mouth at bedtime.   Marland Kitchen loratadine (CLARITIN) 10 MG tablet Take 10 mg by mouth daily as needed for allergies.  . metoprolol succinate (TOPROL-XL) 25 MG 24 hr tablet Take 0.5 tablets (12.5 mg total) by mouth daily.  Marland Kitchen PARoxetine (PAXIL) 30 MG tablet Take 30 mg by mouth every evening.  . Probiotic Product (PROBIOTIC DAILY) CAPS Take 1 capsule by mouth daily.  . psyllium (METAMUCIL) 58.6 % packet Take 1 packet by mouth See admin instructions. Mix and drink 1 packet by mouth every other night  . cephALEXin (KEFLEX) 500 MG capsule Take 1 capsule (500 mg total) by mouth 4 (four) times daily. (Patient not taking: Reported on 07/31/2019)  . Iron, Ferrous Gluconate, 256 (28 Fe) MG TABS Take one daily  . oxyCODONE-acetaminophen (PERCOCET) 7.5-325 MG tablet Take 0.5-1 tablets by mouth every 4 (four) hours as needed for severe pain.  (Patient not taking: Reported on 07/31/2019)  . polyethylene glycol powder (GLYCOLAX/MIRALAX) 17 GM/SCOOP powder Take 17 g by mouth daily as needed for mild constipation. (Patient not taking: Reported on 07/31/2019)  . QUEtiapine (SEROQUEL) 50 MG tablet Take 1 tablet (50 mg total) by mouth at bedtime.  . Turmeric 500 MG TABS Take 500 mg by mouth daily. (Patient not taking: Reported on 07/31/2019)  . [DISCONTINUED] mirtazapine (REMERON) 7.5 MG tablet Take 1 tablet (7.5 mg total) by mouth at bedtime.   No facility-administered encounter medications on file as of 07/31/2019.    Follow-up: Return in about 2 weeks (around 08/14/2019).  Libby Maw, MD

## 2019-08-01 ENCOUNTER — Ambulatory Visit (INDEPENDENT_AMBULATORY_CARE_PROVIDER_SITE_OTHER): Payer: Medicare Other | Admitting: Student

## 2019-08-01 ENCOUNTER — Telehealth: Payer: Self-pay | Admitting: Radiology

## 2019-08-01 ENCOUNTER — Encounter: Payer: Self-pay | Admitting: Student

## 2019-08-01 ENCOUNTER — Other Ambulatory Visit: Payer: Self-pay

## 2019-08-01 VITALS — BP 106/50 | HR 63 | Ht 70.0 in | Wt 140.4 lb

## 2019-08-01 DIAGNOSIS — N183 Chronic kidney disease, stage 3 unspecified: Secondary | ICD-10-CM

## 2019-08-01 DIAGNOSIS — Q211 Atrial septal defect: Secondary | ICD-10-CM | POA: Diagnosis not present

## 2019-08-01 DIAGNOSIS — I5042 Chronic combined systolic (congestive) and diastolic (congestive) heart failure: Secondary | ICD-10-CM | POA: Diagnosis not present

## 2019-08-01 DIAGNOSIS — I4892 Unspecified atrial flutter: Secondary | ICD-10-CM | POA: Diagnosis not present

## 2019-08-01 DIAGNOSIS — D649 Anemia, unspecified: Secondary | ICD-10-CM

## 2019-08-01 DIAGNOSIS — Q2112 Patent foramen ovale: Secondary | ICD-10-CM

## 2019-08-01 DIAGNOSIS — I10 Essential (primary) hypertension: Secondary | ICD-10-CM

## 2019-08-01 MED ORDER — FUROSEMIDE 40 MG PO TABS
40.0000 mg | ORAL_TABLET | Freq: Every day | ORAL | 3 refills | Status: DC | PRN
Start: 2019-08-01 — End: 2019-09-18

## 2019-08-01 MED ORDER — POTASSIUM CHLORIDE CRYS ER 20 MEQ PO TBCR: 20.0000 meq | EXTENDED_RELEASE_TABLET | Freq: Every day | ORAL | 3 refills | Status: DC | PRN

## 2019-08-01 NOTE — Telephone Encounter (Signed)
Enrolled patient for a 30 day [employer] Event monitor to be mailed to patients home.  

## 2019-08-01 NOTE — Patient Instructions (Addendum)
Medication Instructions:  Callie PA has prescribed lasix 40mg  to use as needed for swelling and/or weight gain of 3lbs in 24 hours or 5lbs in 1 week If you take lasix, please take potassium along with it  *If you need a refill on your cardiac medications before your next appointment, please call your pharmacy*   Lab Work: CBC, BMET today   If you have labs (blood work) drawn today and your tests are completely normal, you will receive your results only by: MyChart Message (if you have MyChart) OR . A paper copy in the mail If you have any lab test that is abnormal or we need to change your treatment, we will call you to review the results.   Testing/Procedures: 30 day event monitor - will be mailed to your home * start once you complete IV antibiotics   Follow-Up: At [facility], you and your health needs are our priority.  As part of our continuing mission to provide you with exceptional heart care, we have created designated Provider Care Teams.  These Care Teams include your primary Cardiologist (physician) and Advanced Practice Providers (APPs -  Physician Assistants and Nurse Practitioners) who all work together to provide you with the care you need, when you need it.  We recommend signing up for the patient portal called "MyChart".  Sign up information is provided on this After Visit Summary.  MyChart is used to connect with patients for Virtual Visits (Telemedicine).  Patients are able to view lab/test results, encounter notes, upcoming appointments, etc.  Non-urgent messages can be sent to your provider as well.   To learn more about what you can do with MyChart, go to [facility].    Your next appointment:   3 month(s)  The format for your next appointment:   In Person  Provider:   [email or web address], MD   Other Instructions

## 2019-08-02 LAB — CBC
Hematocrit: 30.4 % — ABNORMAL LOW (ref 37.5–51.0)
Hemoglobin: 9.6 g/dL — ABNORMAL LOW (ref 13.0–17.7)
MCH: 29.2 pg (ref 26.6–33.0)
MCHC: 31.6 g/dL (ref 31.5–35.7)
MCV: 92 fL (ref 79–97)
Platelets: 358 10*3/uL (ref 150–450)
RBC: 3.29 x10E6/uL — ABNORMAL LOW (ref 4.14–5.80)
RDW: 14.1 % (ref 11.6–15.4)
WBC: 7 10*3/uL (ref 3.4–10.8)

## 2019-08-02 LAB — BASIC METABOLIC PANEL
BUN/Creatinine Ratio: 27 — ABNORMAL HIGH (ref 10–24)
BUN: 30 mg/dL — ABNORMAL HIGH (ref 8–27)
CO2: 27 mmol/L (ref 20–29)
Calcium: 9.6 mg/dL (ref 8.6–10.2)
Chloride: 102 mmol/L (ref 96–106)
Creatinine, Ser: 1.11 mg/dL (ref 0.76–1.27)
GFR calc Af Amer: 73 mL/min/{1.73_m2} (ref >59)
GFR calc non Af Amer: 63 mL/min/{1.73_m2} (ref >59)
Glucose: 178 mg/dL — ABNORMAL HIGH (ref 65–99)
Potassium: 5.5 mmol/L — ABNORMAL HIGH (ref 3.5–5.2)
Sodium: 139 mmol/L (ref 134–144)

## 2019-08-04 ENCOUNTER — Other Ambulatory Visit: Payer: Self-pay

## 2019-08-04 DIAGNOSIS — E875 Hyperkalemia: Secondary | ICD-10-CM

## 2019-08-04 LAB — ERYTHROPOIETIN: Erythropoietin: 17.5 m[IU]/mL (ref 2.6–18.5)

## 2019-08-13 ENCOUNTER — Encounter: Payer: Self-pay | Admitting: *Deleted

## 2019-08-13 ENCOUNTER — Ambulatory Visit
Admission: RE | Admit: 2019-08-13 | Discharge: 2019-08-13 | Disposition: A | Discharge status: Home / Self Care | Payer: BLUE CROSS/BLUE SHIELD | Source: Ambulatory Visit | Attending: Family Medicine | Admitting: Family Medicine

## 2019-08-13 ENCOUNTER — Ambulatory Visit
Admission: RE | Admit: 2019-08-13 | Discharge: 2019-08-13 | Disposition: A | Discharge status: Home / Self Care | Payer: BLUE CROSS/BLUE SHIELD | Source: Ambulatory Visit | Attending: Radiology | Admitting: Radiology

## 2019-08-13 ENCOUNTER — Other Ambulatory Visit: Payer: Self-pay

## 2019-08-13 DIAGNOSIS — L0291 Cutaneous abscess, unspecified: Secondary | ICD-10-CM

## 2019-08-13 HISTORY — PX: IR RADIOLOGIST EVAL & MGMT: IMG5224

## 2019-08-13 IMAGING — CT CT L SPINE W/ CM
2 of 3 series · 9 of 27 positions shown, 11 images · IV contrast (iopamidol)
Comparison: CT abdomen/pelvis [DATE], contrast-enhanced lumbar
spine MRI [DATE].

CLINICAL DATA: Provided history: Abscess. Abscess status post back
fusion.

EXAM:
LUMBAR SPINE WITH CONTRAST
TECHNIQUE: Multidetector CT imaging of the cervical, lumbar, and thoracic spine
was performed with intravenous contrast. Multiplanar CT image
reconstructions were also generated.
CONTRAST:  100mL [5L] IOPAMIDOL ([5L]) INJECTION 61%

[Series 3: l-spine 2.00 br40 s3 · axial · 0.36mm/px · z∈[+1167,+1367]mm · 4 of 152 slices shown, 5 images]
[im 26/152  soft-tissue]
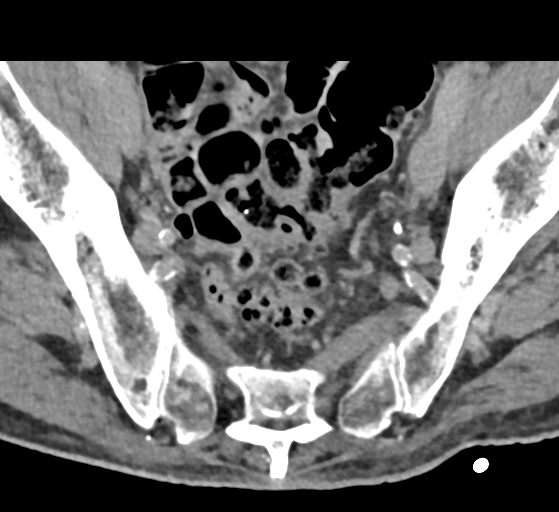
[im 26/152  bone]
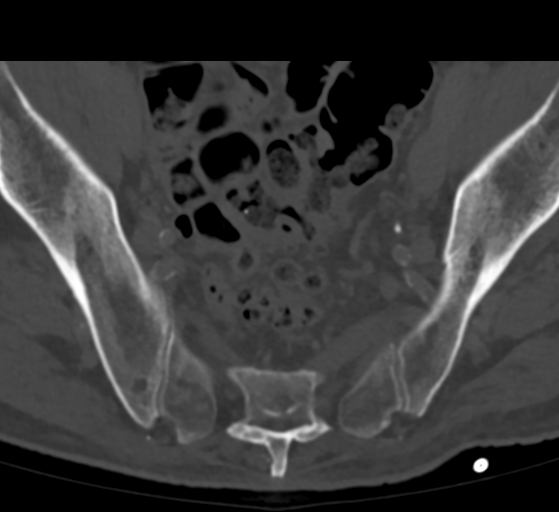
[im 51/152  bone]
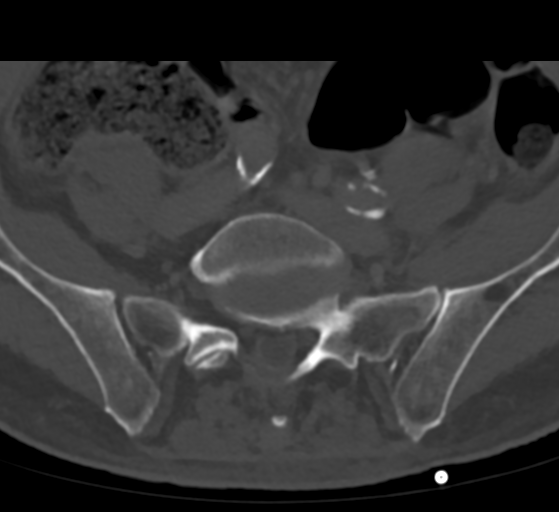
[im 101/152  bone]
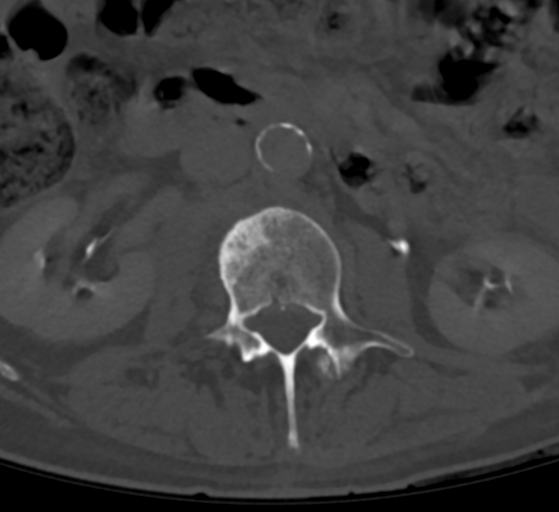
[im 126/152  bone]
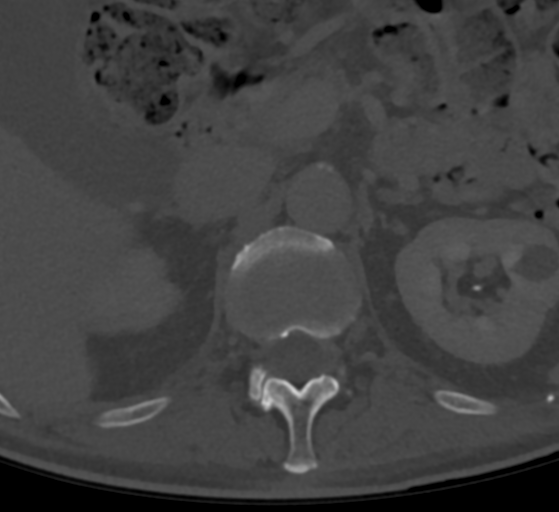

[Series 7: l-spine 2.00 br60 s3 · sagittal · 0.39mm/px · 5 of 117 slices shown, 6 images]
[im 39/117  bone]
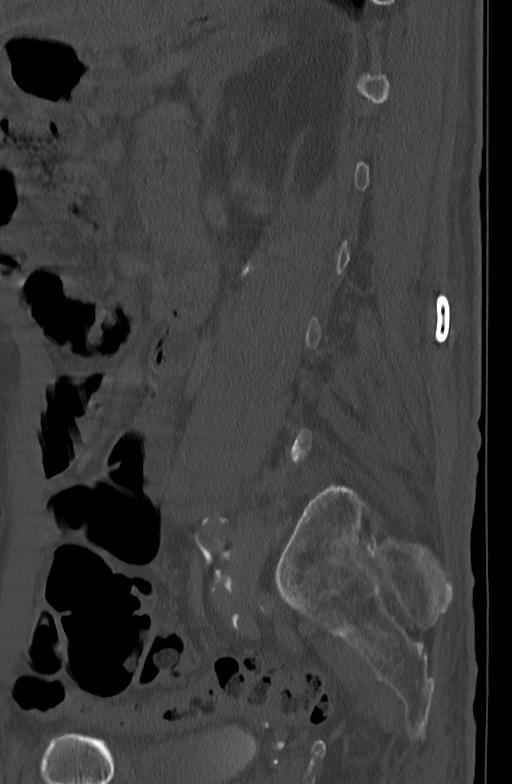
[im 49/117  bone]
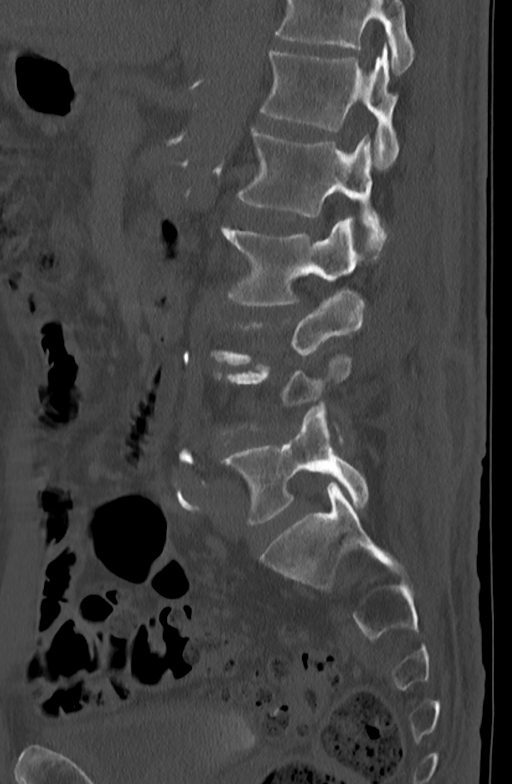
[im 59/117  soft-tissue]
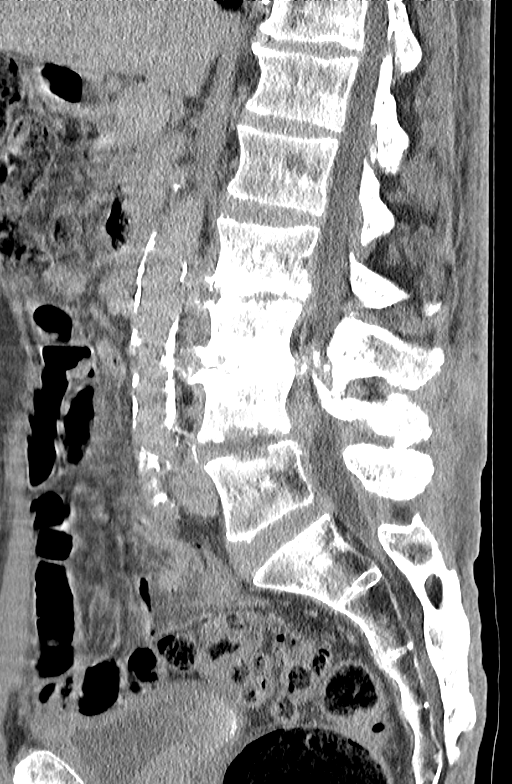
[im 59/117  bone]
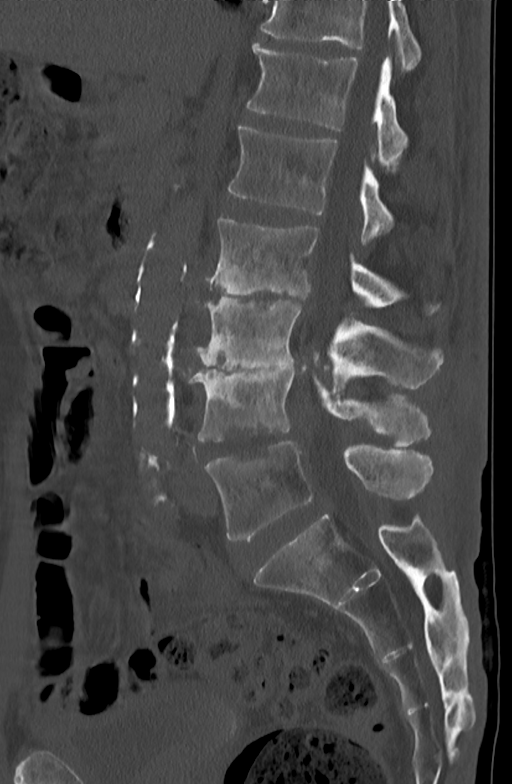
[im 68/117  bone]
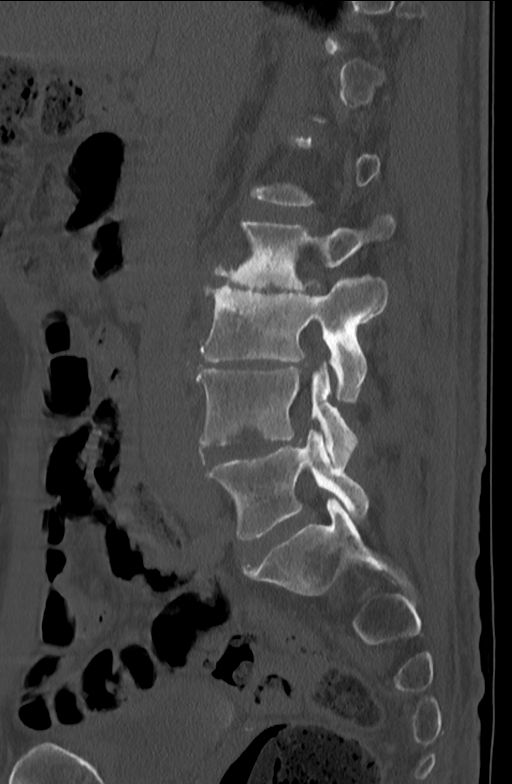
[im 78/117  bone]
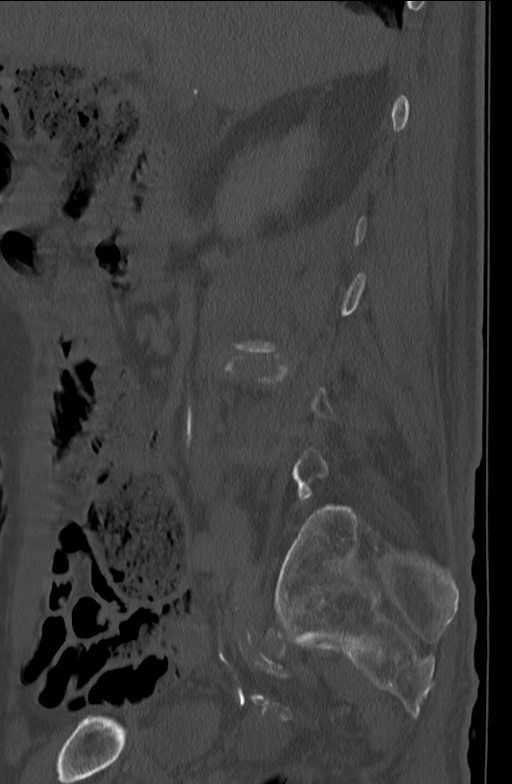

[9 of 27 positions shown; findings below may reference images not displayed]

FINDINGS: Segmentation: 5 lumbar vertebrae are assumed.

Alignment: Lumbar dextrocurvature. No significant spondylolisthesis.

Vertebrae: Endplate irregularity and sclerosis at the L2-L3 and
L3-L4 levels progressed as compared to prior MRI [DATE] and
findings likely reflect sequela of discitis/osteomyelitis.
Additionally, there are erosive changes affecting the L4 and L5
states, significantly progressed as compared to prior lumbar spine
MRI [DATE] and CT abdomen/pelvis [DATE], likely reflecting
sequela of discitis/osteomyelitis.

Paraspinal and other soft tissues: Left renal cysts. Aortoiliac
atherosclerosis. A drainage catheter terminates within the
subcutaneous soft tissues at the L3 level. The abscess previously
demonstrated surrounding this catheter has resolved. Immediately
adjacent within the left paramidline subcutaneous soft tissues
centered at the L4 level there is a peripherally enhancing fluid
collection which measures 2.7 x 2.9 x 4.6 cm. Per the IR staff,
there is no associated warmth or redness at this site and this may
reflect a seroma.

Disc levels:

Progressive disc space narrowing at L2-L3, L3-L4 and L4-L5. No more
than mild disc space narrowing at the remaining levels.

Unless otherwise stated, the level by level findings below have not
significantly changed since prior MRI lumbar spine MRI [DATE].

Please note that evaluation for new or residual epidural
phlegmon/abscess is limited by CT modality and contrast-enhanced MRI
would be more sensitive for this indication.

T12-L1. Previously demonstrated ventral epidural abscess is not
definitively appreciated by CT modality. No appreciable significant
spinal canal stenosis or foraminal narrowing.

L1-L2: Previously demonstrated ventral epidural abscess is not
definitively appreciated by CT modality. No appreciable significant
spinal canal stenosis or neural foraminal narrowing.

L2-L3: Progressive disc height loss and endplate changes. Abnormal
enhancement within the ventral spinal canal measuring at least 4 mm
in AP dimension likely reflecting residual ventral epidural
abscess/phlegmon (series 3, image 60). Disc bulge with endplate
spurring. Facet arthrosis/ligamentum flavum hypertrophy. Suspected
persistent moderate to severe spinal canal stenosis. Bilateral
neural foraminal narrowing (moderate right, mild left).

L3-L4: Progressive disc height loss and endplate changes. No
definite abnormal enhancement within the spinal canal at this level.
Disc bulge with endplate spurring. Facet arthrosis/ligamentum flavum
hypertrophy. Persistent severe spinal canal stenosis bilateral
neural foraminal narrowing (mild right, severe left).

L4-L5: Redemonstrated post-laminectomy changes on the left.
Progressive disc space narrowing and endplate changes. Persistent
ventral epidural phlegmon/abscess posterior to the L4 vertebral body
and at the L4-L5 disc level measuring up to 10 mm in AP dimension
(series 7, image 58) (series 3, image 80). Disc bulge with endplate
spurring. Facet hypertrophy. There is apparent progressive
moderate/severe spinal canal stenosis at the L4 vertebral body level
(for instance as seen on series 3, image 78). At the L4-L5 disc
space level, there is persistent moderate/severe left subarticular
narrowing. Severe bilateral neural foraminal narrowing.

L5-S1: Prior posterior decompression. A small residual no
appreciable significant spinal canal stenosis or neural foraminal
narrowing. Epidural abscess within the dorsal spinal canal is
questioned, measuring 6 mm in AP dimension (series 3, image 100).
IMPRESSION: Redemonstrated drainage catheter within the subcutaneous soft
tissues of the low back on the left at the L3 level. The abscess
previously demonstrated surrounding this catheter has resolved.
Immediately adjacent, within the paramedian left subcutaneous soft
tissues and centered at the L4 level, there is a peripherally
enhancing fluid collection which measures 2.7 x 2.9 x 4.6 cm. Per
discussion with the IR staff, there is no associated warmth or
redness at this site on physical exam and this may reflect a
postoperative seroma. Abscess cannot be excluded by imaging and
close clinical follow-up is recommended.

Progressive endplate irregularity and disc space narrowing at L2-L3
and L3-L4, likely reflecting sequela of discitis/osteomyelitis.

Progressive endplate irregularity and disc space narrowing at L4-L5,
likely reflecting sequela of discitis/osteomyelitis.

Evaluation for residual or new epidural abscess/phlegmon is limited
by CT modality and contrast-enhanced lumbar spine MRI would have
greater sensitivity for this indication. However, there is likely
persistent ventral epidural abscess/phlegmon at the L2-L3 and L4-L5
levels as described. Additionally, a small residual dorsal epidural
abscess is questioned at the L5-S1 level.

Apparent multifactorial progressive moderate/severe spinal canal
stenosis at the L4 vertebral body level. Persistent moderate/severe
left subarticular narrowing at the L4-L5 disc level.

Persistent multifactorial severe L3-L4 spinal canal stenosis.

Persistent multifactorial moderate/severe L2-L3 spinal canal
stenosis.

## 2019-08-13 MED ORDER — IOPAMIDOL (ISOVUE-300) INJECTION 61%
100.0000 mL | Freq: Once | INTRAVENOUS | Status: AC | PRN
  Administered 2019-08-13: 100 mL via INTRAVENOUS

## 2019-08-13 NOTE — Progress Notes (Signed)
Chief Complaint: Back abscess  Referring Physician(s): Nundkumar  Supervising Physician: Jacqulynn Cadet  History of Present Illness: Neil Cordova is a 79 y.o. male who had lumbar microdiscectomy of L4-5 by Dr. 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   He was taken back to the OR for washout on 5/25 then had IR drain placed by Dr. Anselm Pancoast on 07/05/19.  He is here today for drain follow up.  He is doing great. No fevers. Remains on Nafcillin until 08/17/19.  He tells me his wife "takes care of the drain" so he doesn't really know how often it is being flushed. He thinks she flushes it every other day.  She kept good record of the output and from 6/25 - 6/27 there was 10 mL out per day.  From 6-28 - 6/30 only 5 mL per day. The last 6 days have been basically immeasurable.  Past Medical History:  Diagnosis Date  . Anxiety   . Hypertension     Past Surgical History:  Procedure Laterality Date  . BACK SURGERY    . CARDIOVERSION N/A 07/25/2019   Procedure: CARDIOVERSION;  Surgeon: Josue Hector, MD;  Location: [facility] ENDOSCOPY;  Service: Cardiovascular;  Laterality: N/A;  . IR US GUIDE BX ASP/DRAIN  07/05/2019  . LUMBAR WOUND DEBRIDEMENT N/A 07/01/2019   Procedure: LUMBAR WOUND EXPLORATION;  Surgeon: Consuella Lose, MD;  Location: [place];  Service: Neurosurgery;  Laterality: N/A;  posterior  . TEE WITHOUT CARDIOVERSION N/A 07/04/2019   Procedure: TRANSESOPHAGEAL ECHOCARDIOGRAM (TEE);  Surgeon: Acie Fredrickson Wonda Cheng, MD;  Location: [place];  Service: Cardiovascular;  Laterality: N/A;  . TEE WITHOUT CARDIOVERSION N/A 07/25/2019   Procedure: TRANSESOPHAGEAL ECHOCARDIOGRAM (TEE);  Surgeon: Josue Hector, MD;  Location: [facility] ENDOSCOPY;  Service: Cardiovascular;  Laterality: N/A;    Allergies: Patient has no known allergies.  Medications: Prior to Admission  medications   Medication Sig Start Date End Date Taking? Authorizing Provider  acetaminophen (TYLENOL) 325 MG tablet Take 650 mg by mouth every 6 (six) hours as needed for mild pain, moderate pain or headache.     [provider]  apixaban (ELIQUIS) 5 MG TABS tablet Take 1 tablet (5 mg total) by mouth 2 (two) times daily. 07/10/19   Costella, Vista Mink, PA-C  ceFAZolin (ANCEF) 2-4 GM/100ML-% IVPB Inject 100 mLs (2 g total) into the vein every 8 (eight) hours for 20 days. 07/28/19 08/17/19  Terrilee Croak, MD  ceFAZolin (ANCEF) IVPB Inject 2 g into the vein every 8 (eight) hours for 20 days. Indication:  MSSA bacteremia/discitis First Dose: Yes Last Day of Therapy:  08/17/19 Labs - Once weekly:  CBC/D and BMP, Labs - Every other week:  ESR and CRP Method of administration: IV Push Method of administration may be changed at the discretion of home infusion pharmacist based upon assessment of the patient and/or caregiver's ability to self-administer the medication ordered. 07/28/19 08/17/19  Terrilee Croak, MD  cephALEXin (KEFLEX) 500 MG capsule Take 1 capsule (500 mg total) by mouth 4 (four) times daily. 07/30/19   Carlyle Basques, MD  fluticasone (FLONASE) 50 MCG/ACT nasal spray Place 1 spray into both nostrils daily as needed for allergies or rhinitis.    [provider]  furosemide (LASIX) 40 MG tablet Take 1 tablet (40 mg total) by mouth daily as needed for edema (weight gain of 3lbs in 24 hours or 5lbs in 1 week). 08/01/19  10/30/19  Darreld Mclean, PA-C  Iron, Ferrous Gluconate, 256 (28 Fe) MG TABS Take one daily 07/31/19   Libby Maw, MD  lisinopril (ZESTRIL) 10 MG tablet Take 10 mg by mouth at bedtime.     [provider]  loratadine (CLARITIN) 10 MG tablet Take 10 mg by mouth daily as needed for allergies.    [provider]  metoprolol succinate (TOPROL-XL) 25 MG 24 hr tablet Take 0.5 tablets (12.5 mg total) by mouth daily. 07/28/19 10/26/19  Terrilee Croak, MD  PARoxetine (PAXIL) 30 MG tablet Take 30 mg by mouth every evening.    [provider]  polyethylene glycol powder (GLYCOLAX/MIRALAX) 17 GM/SCOOP powder Take 17 g by mouth daily as needed for mild constipation.     [provider]  potassium chloride SA (KLOR-CON) 20 MEQ tablet Take 1 tablet (20 mEq total) by mouth daily as needed (take only w/lasix). 08/01/19   Sande Rives E, PA-C  Probiotic Product (PROBIOTIC DAILY) CAPS Take 1 capsule by mouth daily.    [provider]  psyllium (METAMUCIL) 58.6 % packet Take 1 packet by mouth See admin instructions. Mix and drink 1 packet by mouth every other night    [provider]  QUEtiapine (SEROQUEL) 50 MG tablet Take 1 tablet (50 mg total) by mouth at bedtime. 07/31/19   Libby Maw, MD     Family History  Problem Relation Age of Onset  . Emphysema Father   . CAD Neg Hx   . Heart failure Neg Hx     Social History   Socioeconomic History  . Marital status: Married    Spouse name: Not on file  . Number of children: Not on file  . Years of education: Not on file  . Highest education level: Not on file  Occupational History  . Not on file  Tobacco Use  . Smoking status: Former Smoker    Quit date: 1972    Years since quitting: 49.5  . Smokeless tobacco: Current User    Types: Snuff  Vaping Use  . Vaping Use: Never used  Substance and Sexual Activity  . Alcohol use: Not Currently  . Drug use: Never  . Sexual activity: Not on file  Other Topics Concern  . Not on file  Social History Narrative  . Not on file   Social Determinants of Health   Financial Resource Strain: Low Risk   . Difficulty of Paying Living Expenses: Not hard at all  Food Insecurity: No Food Insecurity  . Worried About Charity fundraiser in the Last Year: Never true  . Ran Out of Food in the Last Year: Never true  Transportation Needs: No Transportation Needs  . Lack of Transportation (Medical): No    . Lack of Transportation (Non-Medical): No  Physical Activity: Sufficiently Active  . Days of Exercise per Week: 5 days  . Minutes of Exercise per Session: 60 min  Stress:   . Feeling of Stress :   Social Connections:   . Frequency of Communication with Friends and Family:   . Frequency of Social Gatherings with Friends and Family:   . Attends Religious Services:   . Active Member of Clubs or Organizations:   . Attends Archivist Meetings:   Marland Kitchen Marital Status:      Review of Systems: A 12 point ROS discussed and pertinent positives are indicated in the HPI above.  All other systems are negative.  Review of Systems  Vital Signs: There were no vitals taken for this visit.  Physical Exam Constitutional:      Appearance: Normal appearance.  HENT:     Head: Normocephalic and atraumatic.  Cardiovascular:     Rate and Rhythm: Normal rate.  Pulmonary:     Effort: Pulmonary effort is normal. No respiratory distress.  Abdominal:     Palpations: Abdomen is soft.     Tenderness: There is no abdominal tenderness.  Musculoskeletal:       Back:     Comments: Blue - Incision Red - are of fluctuance c/w probable post op seroma. No erythema, no tenders, no induration.  Skin:    General: Skin is warm and dry.  Neurological:     General: No focal deficit present.     Mental Status: He is alert and oriented to person, place, and time.  Psychiatric:        Mood and Affect: Mood normal.        Behavior: Behavior normal.        Thought Content: Thought content normal.        Judgment: Judgment normal.                                       /\                                   /  \    Imaging: DG Chest 2 View  Result Date: 07/22/2019 CLINICAL DATA:  Lower extremity swelling. EXAM: CHEST - 2 VIEW COMPARISON:  Chest x-ray dated July 08, 2019. FINDINGS: Unchanged right upper extremity PICC line. The heart size and mediastinal contours are within normal limits. Normal pulmonary  vascularity. Trace bilateral pleural effusions. No consolidation or pneumothorax. No acute osseous abnormality. IMPRESSION: 1. Trace bilateral pleural effusions. Electronically Signed   By: Titus Dubin M.D.   On: 07/22/2019 11:46   CT ABDOMEN PELVIS W CONTRAST  Result Date: 07/24/2019 CLINICAL DATA:  History of abscess within the subcutaneous tissues about the caudal aspect of the left flank following L4-L5 laminectomy, post ultrasound-guided placement of percutaneous catheter into the subcutaneous flank abscess on 07/05/2019. EXAM: CT ABDOMEN AND PELVIS WITH CONTRAST TECHNIQUE: Multidetector CT imaging of the abdomen and pelvis was performed using the standard protocol following bolus administration of intravenous contrast. CONTRAST:  110m OMNIPAQUE IOHEXOL 300 MG/ML  SOLN COMPARISON:  Ultrasound-guided subcutaneous drainage catheter placement-07/05/2019 Lumbar spine MRI-07/04/2019 FINDINGS: Lower chest: Limited visualization of the lower thorax demonstrates small/trace bilateral effusions with associated bibasilar atelectasis, right greater than left. Note is made of a punctate (approximately 0.4 cm granuloma within the right lower lobe (image 8, series 5). Normal heart size. Coronary artery calcifications. No pericardial effusion. Hepatobiliary: Normal hepatic contour. There are multiple punctate subcentimeter hypoattenuating hepatic lesions which are too small to adequately characterize though favored to represent hepatic cysts. Note is made of multiple punctate calcified hepatic granulomas. There is a minimal amount of focal fatty infiltration adjacent to the fissure for the ligamentum teres. No discrete worrisome hepatic lesions. Normal appearance of the gallbladder given degree distention. No radiopaque gallstones. No intra or extrahepatic biliary ductal dilatation. No ascites. Pancreas: Normal appearance of the pancreas. Spleen: Multiple punctate granulomas are seen within otherwise normal-appearing  spleen. Adrenals/Urinary Tract: Symmetric enhancement and excretion of the bilateral kidneys. No evidence  of nephrolithiasis on this postcontrast examination. Note is made of an approximately 1.4 cm hypoattenuating nonenhancing left-sided renal cyst. No discrete right-sided renal lesions. No urinary obstruction or perinephric stranding. There is mild thickening of the left adrenal gland without discrete nodule. Normal appearance of the right adrenal gland. Normal appearance of the urinary bladder given degree distention. Stomach/Bowel: Extensive colonic diverticulosis, primarily involving the sigmoid colon, without evidence of superimposed acute diverticulitis. Moderate to large colonic stool burden without evidence of enteric obstruction. Normal appearance of the terminal ileum and the retrocecal appendix. No discrete areas of bowel wall thickening. No pneumoperitoneum, pneumatosis or portal venous gas. Vascular/Lymphatic: Moderate amount of slightly irregular mixed noncalcified though predominantly calcified atherosclerotic plaque within a normal caliber abdominal aorta. The major branch vessels of the abdominal aorta appear patent on this non CTA examination. No bulky retroperitoneal, mesenteric, pelvic or inguinal lymphadenopathy. Reproductive: Prostatomegaly with mass effect on the undersurface of the urinary bladder. No free fluid the pelvic cul-de-sac. Other: Interval reduction/near resolution of subcutaneous fluid collection about the caudal aspect of the left lower back/flank with residual serpiginous collection measuring approximately 8.8 x 6.4 x 0.9 cm (sagittal image 118, series 7; axial image 43, series 6). Diffuse body wall anasarca, most conspicuous about the bilateral flanks and lateral thighs bilaterally. Musculoskeletal: Post left-sided L4 laminectomy (axial image 45, series 3), with suspected residual ventral epidural thickening extending from L2 through L4, suboptimally evaluated. Severe DDD of  L2-L3 and L3-L4 with disc space height loss, endplate irregularity and small posteriorly directed disc osteophyte complex at L3-L4. Additionally, there is endplate lucency about the L2-L3 and L3-L4 intervertebral disc spaces, most conspicuous at L2-L3 (coronal image 96, series 7) which is nonspecific though could be seen in the setting of discitis/osteomyelitis as was suggested on lumbar spine MRI performed 07/04/2019 Mild degenerative change of the bilateral hips, left greater than right, with joint space loss, subchondral sclerosis and osteophytosis. IMPRESSION: 1. Post percutaneous drainage catheter placement with interval reduction/near resolution of serpiginous fluid collection within the subcutaneous tissues about the left lower back/flank. No new definable/drainable fluid collections. 2. Suspected ventral epidural thickening extending from L2-L4, incompletely evaluated on the present examination though previously was worrisome for an epidural abscess. Additionally, there is severe DDD of L2-L3 and L3-L4, however given associated endplate lucency findings could be attributable to discitis/osteomyelitis (as was suggested on previous lumbar spine MRI). Further evaluation with repeat contrast-enhanced lumbar spine MRI as indicated. 3. Small/trace bilateral effusions with mild diffuse body wall anasarca, nonspecific though could be seen in the setting of congestive heart failure. Clinical correlation is advised. 4. Extensive colonic diverticulosis, primarily involving the sigmoid colon, without evidence of superimposed acute diverticulitis. 5. Large colonic stool burden without evidence of enteric obstruction. 6. Prostatomegaly with mass effect on the undersurface of the urinary bladder. If not recently performed, further evaluation with DRE is advised. 7. See quell a of previous granulomatous infection as above. 8.  Aortic Atherosclerosis (ICD10-I70.0). Electronically Signed   By: Sandi Mariscal M.D.   On:  07/24/2019 08:12   ECHOCARDIOGRAM COMPLETE  Result Date: 07/23/2019    ECHOCARDIOGRAM REPORT   Patient Name:   Neil Cordova Date of Exam: 07/23/2019 Medical Rec #:  [id number]     Height:       70.0 in Accession #:    [id number]    Weight:       160.8 lb Date of Birth:  [birth year]     BSA:  1.902 m Patient Age:    37 years      BP:           145/76 mmHg Patient Gender: M             HR:           168 bpm. Exam Location:  Inpatient Procedure: 2D Echo Indications:    CHF-Acute Systolic 867.67 / M09.47  History:        Patient has prior history of Echocardiogram examinations, most                 recent 07/03/2019. Risk Factors:Hypertension.  Sonographer:    Mikki Santee RDCS (AE) Referring Phys: [place]  1. Left ventricular ejection fraction, by estimation, is 55 to 60%. The left ventricle has normal function. The left ventricle has no regional wall motion abnormalities. Left ventricular diastolic parameters are indeterminate.  2. Right ventricular systolic function is normal. The right ventricular size is normal.  3. Left atrial size was mildly dilated.  4. The mitral valve is normal in structure. Mild mitral valve regurgitation. No evidence of mitral stenosis.  5. The aortic valve has an indeterminant number of cusps. Aortic valve regurgitation is not visualized. Mild to moderate aortic valve sclerosis/calcification is present, without any evidence of aortic stenosis.  6. The inferior vena cava is normal in size with greater than 50% respiratory variability, suggesting right atrial pressure of 3 mmHg. FINDINGS  Left Ventricle: Left ventricular ejection fraction, by estimation, is 55 to 60%. The left ventricle has normal function. The left ventricle has no regional wall motion abnormalities. The left ventricular internal cavity size was normal in size. There is  no left ventricular hypertrophy. Left ventricular diastolic parameters are indeterminate. Normal left ventricular  filling pressure. Right Ventricle: The right ventricular size is normal. No increase in right ventricular wall thickness. Right ventricular systolic function is normal. Left Atrium: Left atrial size was mildly dilated. Right Atrium: Right atrial size was normal in size. Pericardium: There is no evidence of pericardial effusion. Mitral Valve: The mitral valve is normal in structure. Normal mobility of the mitral valve leaflets. Mild mitral valve regurgitation. No evidence of mitral valve stenosis. Tricuspid Valve: The tricuspid valve is normal in structure. Tricuspid valve regurgitation is trivial. No evidence of tricuspid stenosis. Aortic Valve: The aortic valve has an indeterminant number of cusps. . There is mild thickening and mild calcification of the aortic valve. Aortic valve regurgitation is not visualized. Mild to moderate aortic valve sclerosis/calcification is present, without any evidence of aortic stenosis. There is mild thickening of the aortic valve. There is mild calcification of the aortic valve. Pulmonic Valve: The pulmonic valve was normal in structure. Pulmonic valve regurgitation is not visualized. No evidence of pulmonic stenosis. Aorta: The aortic root is normal in size and structure. Venous: The inferior vena cava is normal in size with greater than 50% respiratory variability, suggesting right atrial pressure of 3 mmHg. IAS/Shunts: No atrial level shunt detected by color flow Doppler.  LEFT VENTRICLE PLAX 2D LVIDd:         4.40 cm  Diastology LVIDs:         3.30 cm  LV e' lateral:   15.90 cm/s LV PW:         1.00 cm  LV E/e' lateral: 7.0 LV IVS:        0.90 cm  LV e' medial:    13.80 cm/s LVOT diam:  2.00 cm  LV E/e' medial:  8.1 LV SV:         73 LV SV Index:   38 LVOT Area:     3.14 cm  RIGHT VENTRICLE RV S prime:     10.80 cm/s TAPSE (M-mode): 1.7 cm LEFT ATRIUM             Index       RIGHT ATRIUM           Index LA diam:        4.20 cm 2.21 cm/m  RA Area:     17.00 cm LA Vol (A2C):    78.2 ml 41.10 ml/m RA Volume:   37.10 ml  19.50 ml/m LA Vol (A4C):   55.0 ml 28.91 ml/m LA Biplane Vol: 67.2 ml 35.32 ml/m  AORTIC VALVE LVOT Vmax:   132.00 cm/s LVOT Vmean:  77.600 cm/s LVOT VTI:    0.231 m  AORTA Ao Root diam: 3.60 cm MITRAL VALVE MV Area (PHT): 3.60 cm     SHUNTS MV Decel Time: 211 msec     Systemic VTI:  0.23 m MR Peak grad: 90.6 mmHg     Systemic Diam: 2.00 cm MR Mean grad: 58.0 mmHg MR Vmax:      476.00 cm/s MR Vmean:     350.0 cm/s MV E velocity: 112.00 cm/s MV A velocity: 60.20 cm/s MV E/A ratio:  1.86 Fransico Him MD Electronically signed by Fransico Him MD Signature Date/Time: 07/23/2019/3:42:48 PM    Final    ECHO TEE  Result Date: 07/25/2019    TRANSESOPHOGEAL ECHO REPORT   Patient Name:   Neil Cordova Date of Exam: 07/25/2019 Medical Rec #:  [id number]     Height:       70.0 in Accession #:    [id number]    Weight:       145.8 lb Date of Birth:  [birth year]     BSA:          1.825 m Patient Age:    2 years      BP:           141/51 mmHg Patient Gender: M             HR:           64 bpm. Exam Location:  Inpatient Procedure: 3D Echo, Transesophageal Echo and Color Doppler Indications:     I48.92* Unspecified atrial flutter  History:         Patient has prior history of Echocardiogram examinations, most                  recent 07/23/2019. CHF, Abnormal ECG, Arrythmias:Atrial Flutter;                  Signs/Symptoms:Altered Mental Status and Bacteremia.  Sonographer:     Roseanna Rainbow RDCS Referring Phys:  [id number] Darreld Mclean Diagnosing Phys: Jenkins Rouge MD PROCEDURE: After discussion of the risks and benefits of a TEE, an informed consent was obtained from the patient. The patient was intubated. Anesthesia passed using glid scope after patient intubated. Imaged were obtained with the patient in a left lateral decubitus position. Sedation performed by different physician. The patient was monitored while under deep sedation. Anesthestetic sedation was provided intravenously by  Anesthesiology: 241m of Propofol. Image quality was excellent. The patient developed no complications and Patient intubated electively during the procedure. A successful direct current cardioversion was performed. IMPRESSIONS  1. Anesthesia/Cardiology unable to  pass scope with propofol Patient electively intubated and Dr Daiva Huge passed probe using glide scope and direct visualization.  2. [place] x 1 150J patient converted to NSR from flutter at rate of 64 bpm.  3. Left ventricular ejection fraction, by estimation, is 55 to 60%. The left ventricle has normal function. The left ventricle has no regional wall motion abnormalities. Left ventricular diastolic function could not be evaluated.  4. Right ventricular systolic function is normal. The right ventricular size is normal.  5. Left atrial size was moderately dilated. No left atrial/left atrial appendage thrombus was detected.  6. Right atrial size was moderately dilated.  7. The mitral valve is normal in structure. Mild mitral valve regurgitation.  8. The aortic valve is tricuspid. Aortic valve regurgitation is not visualized. No aortic stenosis is present.  9. PFO present. There is a small patent foramen ovale with predominantly left to right shunting across the atrial septum. FINDINGS  Left Ventricle: Left ventricular ejection fraction, by estimation, is 55 to 60%. The left ventricle has normal function. The left ventricle has no regional wall motion abnormalities. The left ventricular internal cavity size was normal in size. There is  no left ventricular hypertrophy. Left ventricular diastolic function could not be evaluated. Right Ventricle: The right ventricular size is normal. Right vetricular wall thickness was not assessed. Right ventricular systolic function is normal. Left Atrium: Left atrial size was moderately dilated. No left atrial/left atrial appendage thrombus was detected. Right Atrium: Right atrial size was moderately dilated. Pericardium: There is no  evidence of pericardial effusion. Mitral Valve: The mitral valve is normal in structure. Mild mitral valve regurgitation. Tricuspid Valve: The tricuspid valve is normal in structure. Tricuspid valve regurgitation is mild. Aortic Valve: The aortic valve is tricuspid. Aortic valve regurgitation is not visualized. No aortic stenosis is present. Pulmonic Valve: The pulmonic valve was normal in structure. Pulmonic valve regurgitation is trivial. Aorta: The aortic root is normal in size and structure. IAS/Shunts: There is redundancy of the interatrial septum. PFO present. A small patent foramen ovale is detected with predominantly left to right shunting across the atrial septum. Additional Comments: Anesthesia/Cardiology unable to pass scope with propofol Patient electively intubated and Dr Daiva Huge passed probe using glide scope and direct visualization. [place] x 1 150J patient converted to NSR from flutter at rate of 64 bpm. Jenkins Rouge MD Electronically signed by Jenkins Rouge MD Signature Date/Time: 07/25/2019/10:06:24 AM    Final     Labs:  CBC: Recent Labs    07/08/19 0653 07/22/19 1122 07/25/19 0430 08/01/19 1429  WBC 11.7* 6.4 5.9 7.0  HGB 10.0* 8.6* 8.5* 9.6*  HCT 31.5* 28.1* 27.1* 30.4*  PLT 335 247 270 358    COAGS: Recent Labs    06/30/19 1055 07/25/19 1029  INR 1.1 1.6*  APTT 27  --     BMP: Recent Labs    07/26/19 0350 07/27/19 0554 07/28/19 0837 08/01/19 1429  NA 134* 137 138 139  K 4.3 4.2 3.9 5.5*  CL 96* 96* 96* 102  CO2 31 30 31 27   GLUCOSE 158* 81 154* 178*  BUN 18 20 14  30*  CALCIUM 8.7* 9.1 9.4 9.6  CREATININE 1.25* 1.13 1.25* 1.11  GFRNONAA 55* >60 55* 63  GFRAA >60 >60 >60 73    LIVER FUNCTION TESTS: Recent Labs    06/26/19 2020 06/30/19 1055 07/03/19 1159 07/22/19 1122  BILITOT 1.6* 1.1 1.1 0.8  AST 17 29 42* 13*  ALT 19 23 25  <5  ALKPHOS 49 76 102 76  PROT 6.5 5.3* 5.0* 5.9*  ALBUMIN 3.6 2.3* 1.9* 2.1*    TUMOR MARKERS: No results for  input(s): AFPTM, CEA, CA199, CHROMGRNA in the last 8760 hours.  Assessment/Plan:  S/P lumbar microdiscectomy by Dr. 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   S/P IR drain by Dr. Anselm Pancoast on 07/05/19.  CT images today showed resolution of previous fluid collection and a new small more superficial fluid collection just left of the surgical incision. It is not indurated, no erythema, no signs of infection, likely a sterile seroma.   Drain removed today without difficulty. Sterile dressing placed. Can remove it tomorrow.  Continue antibiotics as directed by ID.  Return PRN.   Electronically Signed: Murrell Redden PA-C 08/13/2019, 1:13 PM    Please refer to Dr. Katrinka Blazing attestation of this note for management and plan.

## 2019-08-14 ENCOUNTER — Ambulatory Visit (INDEPENDENT_AMBULATORY_CARE_PROVIDER_SITE_OTHER): Payer: Medicare Other | Admitting: Family Medicine

## 2019-08-14 ENCOUNTER — Encounter: Payer: Self-pay | Admitting: Family Medicine

## 2019-08-14 VITALS — BP 128/68 | HR 58 | Temp 98.4°F | Ht 70.0 in | Wt 142.0 lb

## 2019-08-14 DIAGNOSIS — E611 Iron deficiency: Secondary | ICD-10-CM | POA: Diagnosis not present

## 2019-08-14 DIAGNOSIS — G4701 Insomnia due to medical condition: Secondary | ICD-10-CM

## 2019-08-14 DIAGNOSIS — R7303 Prediabetes: Secondary | ICD-10-CM | POA: Diagnosis not present

## 2019-08-14 DIAGNOSIS — D649 Anemia, unspecified: Secondary | ICD-10-CM | POA: Diagnosis not present

## 2019-08-14 NOTE — Progress Notes (Signed)
Established Patient Office Visit  Subjective:  Patient ID: Neil Cordova, male    DOB: July 09, 1940  Age: 79 y.o. MRN: [id number]  CC:  Chief Complaint  Patient presents with  . Follow-up    2 week follow up, no concerns.     HPI Neil Cordova presents for follow-up of insomnia that has responded well to the Seroquel.  Sleeping much better with it.  He recently started taking his iron pills and is having no troubles taking them.  Good news and that they removed the drain from his lower back.  Continues with Ancef infusions.  Feels as though he can start walking on a trail.  Hopes to eventually be released for running again.  Past medical history of diabetes treated with exercise and diet.  Reminds me that blood work drawn by his doctor back in [place] in April was entirely normal including his hemoglobin.  He denies any blood in his stool or urine.  There has been no melena.  Denies indigestion.  Has not had any changes in his bowel habits.  Cologuard 2 years ago was negative.  Follow-up Cologuard is scheduled for the next few months.  Past Medical History:  Diagnosis Date  . Anxiety   . Hypertension     Past Surgical History:  Procedure Laterality Date  . BACK SURGERY    . CARDIOVERSION N/A 07/25/2019   Procedure: CARDIOVERSION;  Surgeon: Josue Hector, MD;  Location[facility] ENDOSCOPY;  Service: Cardiovascular;  Laterality: N/A;  . IR RADIOLOGIST EVAL & MGMT  08/13/2019  . IR US GUIDE BX ASP/DRAIN  07/05/2019  . LUMBAR WOUND DEBRIDEMENT N/A 07/01/2019   Procedure: LUMBAR WOUND EXPLORATION;  Surgeon: Consuella Lose, MD;  Location: [place];  Service: Neurosurgery;  Laterality: N/A;  posterior  . TEE WITHOUT CARDIOVERSION N/A 07/04/2019   Procedure: TRANSESOPHAGEAL ECHOCARDIOGRAM (TEE);  Surgeon: Acie Fredrickson Wonda Cheng, MD;  Location: [place];  Service: Cardiovascular;  Laterality: N/A;  . TEE WITHOUT CARDIOVERSION N/A 07/25/2019   Procedure: TRANSESOPHAGEAL ECHOCARDIOGRAM (TEE);   Surgeon: Josue Hector, MD;  Location: [facility] ENDOSCOPY;  Service: Cardiovascular;  Laterality: N/A;    Family History  Problem Relation Age of Onset  . Emphysema Father   . CAD Neg Hx   . Heart failure Neg Hx     Social History   Socioeconomic History  . Marital status: Married    Spouse name: Not on file  . Number of children: Not on file  . Years of education: Not on file  . Highest education level: Not on file  Occupational History  . Not on file  Tobacco Use  . Smoking status: Former Smoker    Quit date: 1972    Years since quitting: 49.5  . Smokeless tobacco: Current User    Types: Snuff  Vaping Use  . Vaping Use: Never used  Substance and Sexual Activity  . Alcohol use: Not Currently  . Drug use: Never  . Sexual activity: Not on file  Other Topics Concern  . Not on file  Social History Narrative  . Not on file   Social Determinants of Health   Financial Resource Strain: Low Risk   . Difficulty of Paying Living Expenses: Not hard at all  Food Insecurity: No Food Insecurity  . Worried About Charity fundraiser in the Last Year: Never true  . Ran Out of Food in the Last Year: Never true  Transportation Needs: No Transportation Needs  . Lack of Transportation (Medical):  No  . Lack of Transportation (Non-Medical): No  Physical Activity: Sufficiently Active  . Days of Exercise per Week: 5 days  . Minutes of Exercise per Session: 60 min  Stress:   . Feeling of Stress :   Social Connections:   . Frequency of Communication with Friends and Family:   . Frequency of Social Gatherings with Friends and Family:   . Attends Religious Services:   . Active Member of Clubs or Organizations:   . Attends Archivist Meetings:   Marland Kitchen Marital Status:   Intimate Partner Violence:   . Fear of Current or Ex-Partner:   . Emotionally Abused:   Marland Kitchen Physically Abused:   . Sexually Abused:     Outpatient Medications Prior to Visit  Medication Sig Dispense Refill  .  acetaminophen (TYLENOL) 325 MG tablet Take 650 mg by mouth every 6 (six) hours as needed for mild pain, moderate pain or headache.     Marland Kitchen apixaban (ELIQUIS) 5 MG TABS tablet Take 1 tablet (5 mg total) by mouth 2 (two) times daily. 60 tablet 2  . ceFAZolin (ANCEF) 2-4 GM/100ML-% IVPB Inject 100 mLs (2 g total) into the vein every 8 (eight) hours for 20 days. 1 each   . ceFAZolin (ANCEF) IVPB Inject 2 g into the vein every 8 (eight) hours for 20 days. Indication:  MSSA bacteremia/discitis First Dose: Yes Last Day of Therapy:  08/17/19 Labs - Once weekly:  CBC/D and BMP, Labs - Every other week:  ESR and CRP Method of administration: IV Push Method of administration may be changed at the discretion of home infusion pharmacist based upon assessment of the patient and/or caregiver's ability to self-administer the medication ordered. 60 Units 0  . cephALEXin (KEFLEX) 500 MG capsule Take 1 capsule (500 mg total) by mouth 4 (four) times daily. 120 capsule 3  . fluticasone (FLONASE) 50 MCG/ACT nasal spray Place 1 spray into both nostrils daily as needed for allergies or rhinitis.    . furosemide (LASIX) 40 MG tablet Take 1 tablet (40 mg total) by mouth daily as needed for edema (weight gain of 3lbs in 24 hours or 5lbs in 1 week). 30 tablet 3  . Iron, Ferrous Gluconate, 256 (28 Fe) MG TABS Take one daily 30 tablet 3  . lisinopril (ZESTRIL) 10 MG tablet Take 10 mg by mouth at bedtime.     Marland Kitchen loratadine (CLARITIN) 10 MG tablet Take 10 mg by mouth daily as needed for allergies.    . metoprolol succinate (TOPROL-XL) 25 MG 24 hr tablet Take 0.5 tablets (12.5 mg total) by mouth daily. 45 tablet 0  . PARoxetine (PAXIL) 30 MG tablet Take 30 mg by mouth every evening.    . polyethylene glycol powder (GLYCOLAX/MIRALAX) 17 GM/SCOOP powder Take 17 g by mouth daily as needed for mild constipation.     . potassium chloride SA (KLOR-CON) 20 MEQ tablet Take 1 tablet (20 mEq total) by mouth daily as needed (take only w/lasix).  30 tablet 3  . Probiotic Product (PROBIOTIC DAILY) CAPS Take 1 capsule by mouth daily.    . psyllium (METAMUCIL) 58.6 % packet Take 1 packet by mouth See admin instructions. Mix and drink 1 packet by mouth every other night    . QUEtiapine (SEROQUEL) 50 MG tablet Take 1 tablet (50 mg total) by mouth at bedtime. 30 tablet 1   No facility-administered medications prior to visit.    No Known Allergies  ROS Review of Systems  Constitutional: Negative.  Respiratory: Negative.   Cardiovascular: Negative.   Gastrointestinal: Negative.  Negative for abdominal pain, anal bleeding, blood in stool, constipation, nausea and vomiting.  Endocrine: Negative for polyphagia and polyuria.  Genitourinary: Negative for difficulty urinating, frequency, hematuria and urgency.  Musculoskeletal: Positive for back pain.  Skin: Negative for pallor and rash.  Allergic/Immunologic: Negative for immunocompromised state.  Neurological: Negative for light-headedness and numbness.  Hematological: Does not bruise/bleed easily.  Psychiatric/Behavioral: Negative.       Objective:    Physical Exam Vitals and nursing note reviewed.  Constitutional:      General: He is not in acute distress.    Appearance: Normal appearance. He is normal weight. He is not ill-appearing or toxic-appearing.  HENT:     Head: Normocephalic and atraumatic.     Right Ear: External ear normal.     Left Ear: External ear normal.  Eyes:     General: No scleral icterus.       Right eye: No discharge.        Left eye: No discharge.     Extraocular Movements: Extraocular movements intact.     Conjunctiva/sclera: Conjunctivae normal.  Pulmonary:     Effort: Pulmonary effort is normal.  Skin:    General: Skin is warm and dry.  Neurological:     Mental Status: He is alert and oriented to person, place, and time.  Psychiatric:        Mood and Affect: Mood normal.        Behavior: Behavior normal.     BP 128/68   Pulse (!) 58    Temp 98.4 F (36.9 C) (Tympanic)   Ht 5' 10"  (1.778 m)   Wt 142 lb (64.4 kg)   SpO2 97%   BMI 20.37 kg/m  Wt Readings from Last 3 Encounters:  08/14/19 142 lb (64.4 kg)  08/01/19 140 lb 6.4 oz (63.7 kg)  07/31/19 138 lb 6.4 oz (62.8 kg)     Health Maintenance Due  Topic Date Due  . Hepatitis C Screening  Never done  . TETANUS/TDAP  Never done  . PNA vac Low Risk Adult (1 of 2 - PCV13) Never done    There are no preventive care reminders to display for this patient.  Lab Results  Component Value Date   TSH 1.052 07/04/2019   Lab Results  Component Value Date   WBC 7.0 08/01/2019   HGB 9.6 (L) 08/01/2019   HCT 30.4 (L) 08/01/2019   MCV 92 08/01/2019   PLT 358 08/01/2019   Lab Results  Component Value Date   NA 139 08/01/2019   K 5.5 (H) 08/01/2019   CO2 27 08/01/2019   GLUCOSE 178 (H) 08/01/2019   BUN 30 (H) 08/01/2019   CREATININE 1.11 08/01/2019   BILITOT 0.8 07/22/2019   ALKPHOS 76 07/22/2019   AST 13 (L) 07/22/2019   ALT <5 07/22/2019   PROT 5.9 (L) 07/22/2019   ALBUMIN 2.1 (L) 07/22/2019   CALCIUM 9.6 08/01/2019   ANIONGAP 11 07/28/2019   No results found for: CHOL No results found for: HDL No results found for: LDLCALC No results found for: TRIG No results found for: CHOLHDL Lab Results  Component Value Date   HGBA1C 6.3 (H) 07/23/2019      Assessment & Plan:   Problem List Items Addressed This Visit      Other   Anemia   Insomnia due to medical condition   Prediabetes   Iron deficiency - Primary  No orders of the defined types were placed in this encounter.   Follow-up: Return in about 2 months (around 10/15/2019).   Continue Seroquel nightly.  We will try to increase iron pill to twice daily.  Follow-up in 2 months.  Patient feels as though he can increase his exercise at least by walking and would like to hold off on treatment for the prediabetes.  Libby Maw, MD

## 2019-08-14 NOTE — Patient Instructions (Addendum)

## 2019-08-19 ENCOUNTER — Encounter (INDEPENDENT_AMBULATORY_CARE_PROVIDER_SITE_OTHER): Payer: Medicare Other

## 2019-08-19 DIAGNOSIS — I4892 Unspecified atrial flutter: Secondary | ICD-10-CM | POA: Diagnosis not present

## 2019-08-27 ENCOUNTER — Ambulatory Visit: Payer: Medicare Other | Admitting: Internal Medicine

## 2019-09-01 ENCOUNTER — Other Ambulatory Visit: Payer: Self-pay

## 2019-09-01 ENCOUNTER — Encounter: Payer: Self-pay | Admitting: Internal Medicine

## 2019-09-01 ENCOUNTER — Ambulatory Visit (INDEPENDENT_AMBULATORY_CARE_PROVIDER_SITE_OTHER): Payer: Medicare Other | Admitting: Internal Medicine

## 2019-09-01 VITALS — BP 136/78 | HR 61 | Wt 147.0 lb

## 2019-09-01 DIAGNOSIS — B9561 Methicillin susceptible Staphylococcus aureus infection as the cause of diseases classified elsewhere: Secondary | ICD-10-CM

## 2019-09-01 DIAGNOSIS — T8149XA Infection following a procedure, other surgical site, initial encounter: Secondary | ICD-10-CM | POA: Diagnosis not present

## 2019-09-01 DIAGNOSIS — A4901 Methicillin susceptible Staphylococcus aureus infection, unspecified site: Secondary | ICD-10-CM

## 2019-09-01 DIAGNOSIS — G062 Extradural and subdural abscess, unspecified: Secondary | ICD-10-CM

## 2019-09-01 NOTE — Progress Notes (Signed)
RFV: mssa post op infection  Patient ID: Neil Cordova, male   DOB: 23-Dec-1940, 79 y.o.   MRN: [id number]  HPI 79yo M who developed MSSA SSI after undergoing micro discectomy. Hospital course complicated where he required I x D however his MRI did show large burden of infection including ventral epidural abscess. He also had new onset afib whille at hospitalized. He states that his Pain through mid back but No pain at surgery site. He is currently wearing  holter monitor in place. - 2 wks   Outpatient Encounter Medications as of 09/01/2019  Medication Sig  . acetaminophen (TYLENOL) 325 MG tablet Take 650 mg by mouth every 6 (six) hours as needed for mild pain, moderate pain or headache.   Marland Kitchen apixaban (ELIQUIS) 5 MG TABS tablet Take 1 tablet (5 mg total) by mouth 2 (two) times daily.  . cephALEXin (KEFLEX) 500 MG capsule Take 1 capsule (500 mg total) by mouth 4 (four) times daily.  . fluticasone (FLONASE) 50 MCG/ACT nasal spray Place 1 spray into both nostrils daily as needed for allergies or rhinitis.  . furosemide (LASIX) 40 MG tablet Take 1 tablet (40 mg total) by mouth daily as needed for edema (weight gain of 3lbs in 24 hours or 5lbs in 1 week).  . Iron, Ferrous Gluconate, 256 (28 Fe) MG TABS Take one daily  . lisinopril (ZESTRIL) 10 MG tablet Take 10 mg by mouth at bedtime.   Marland Kitchen loratadine (CLARITIN) 10 MG tablet Take 10 mg by mouth daily as needed for allergies.  . metoprolol succinate (TOPROL-XL) 25 MG 24 hr tablet Take 0.5 tablets (12.5 mg total) by mouth daily.  Marland Kitchen PARoxetine (PAXIL) 30 MG tablet Take 30 mg by mouth every evening.  . polyethylene glycol powder (GLYCOLAX/MIRALAX) 17 GM/SCOOP powder Take 17 g by mouth daily as needed for mild constipation.   . potassium chloride SA (KLOR-CON) 20 MEQ tablet Take 1 tablet (20 mEq total) by mouth daily as needed (take only w/lasix).  . Probiotic Product (PROBIOTIC DAILY) CAPS Take 1 capsule by mouth daily.  . psyllium (METAMUCIL) 58.6 %  packet Take 1 packet by mouth See admin instructions. Mix and drink 1 packet by mouth every other night  . QUEtiapine (SEROQUEL) 50 MG tablet Take 1 tablet (50 mg total) by mouth at bedtime.   No facility-administered encounter medications on file as of 09/01/2019.     Patient Active Problem List   Diagnosis Date Noted  . Iron deficiency 08/14/2019  . Anemia 07/31/2019  . Insomnia due to medical condition 07/31/2019  . Depression, major, single episode, moderate (HCC) 07/31/2019  . Prediabetes 07/31/2019  . Iron deficiency anemia due to chronic blood loss 07/28/2019  . Acute systolic CHF (congestive heart failure) (HCC) 07/22/2019  . Abscess   . SIRS (systemic inflammatory response syndrome) (HCC)   . Epidural abscess   . Acute combined systolic and diastolic congestive heart failure (HCC)   . Bacteremia   . Acute metabolic encephalopathy 07/03/2019  . Delirium 07/03/2019  . Unspecified atrial fibrillation (HCC) 07/03/2019  . Atrial flutter (HCC) 07/03/2019  . Pressure injury of skin 07/01/2019  . Wound infection after surgery 06/30/2019  . Hyponatremia 06/30/2019     Health Maintenance Due  Topic Date Due  . Hepatitis C Screening  Never done  . TETANUS/TDAP  Never done  . PNA vac Low Risk Adult (1 of 2 - PCV13) Never done     Review of Systems Review of Systems  Constitutional: Negative for  fever, chills, diaphoresis, activity change, appetite change, fatigue and unexpected weight change.  HENT: Negative for congestion, sore throat, rhinorrhea, sneezing, trouble swallowing and sinus pressure.  Eyes: Negative for photophobia and visual disturbance.  Respiratory: Negative for cough, chest tightness, shortness of breath, wheezing and stridor.  Cardiovascular: Negative for chest pain, palpitations and leg swelling.  Gastrointestinal: Negative for nausea, vomiting, abdominal pain, diarrhea, constipation, blood in stool, abdominal distention and anal bleeding.  Genitourinary:  Negative for dysuria, hematuria, flank pain and difficulty urinating.  Musculoskeletal: +back pain Negative for myalgias, back pain, joint swelling, arthralgias and gait problem.  Skin: Negative for color change, pallor, rash and wound.  Neurological: Negative for dizziness, tremors, weakness and light-headedness.  Hematological: Negative for adenopathy. Does not bruise/bleed easily.  Psychiatric/Behavioral: Negative for behavioral problems, confusion, sleep disturbance, dysphoric mood, decreased concentration and agitation.    Physical Exam   BP (!) 136/78   Pulse 61   Wt 147 lb (66.7 kg)   SpO2 99%   BMI 21.09 kg/m   gen =a  xo by 4 in nad Back/skin = the incision is well healed CBC Lab Results  Component Value Date   WBC 7.0 08/01/2019   RBC 3.29 (L) 08/01/2019   HGB 9.6 (L) 08/01/2019   HCT 30.4 (L) 08/01/2019   PLT 358 08/01/2019   MCV 92 08/01/2019   MCH 29.2 08/01/2019   MCHC 31.6 08/01/2019   RDW 14.1 08/01/2019   LYMPHSABS 1.4 07/25/2019   MONOABS 0.6 07/25/2019   EOSABS 0.3 07/25/2019    BMET Lab Results  Component Value Date   NA 139 08/01/2019   K 5.5 (H) 08/01/2019   CL 102 08/01/2019   CO2 27 08/01/2019   GLUCOSE 178 (H) 08/01/2019   BUN 30 (H) 08/01/2019   CREATININE 1.11 08/01/2019   CALCIUM 9.6 08/01/2019   GFRNONAA 63 08/01/2019   GFRAA 73 08/01/2019      Assessment and Plan 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

## 2019-09-02 LAB — CBC WITH DIFFERENTIAL/PLATELET
Absolute Monocytes: 621 cells/uL (ref 200–950)
Basophils Absolute: 59 cells/uL (ref 0–200)
Basophils Relative: 1.1 %
Eosinophils Absolute: 92 cells/uL (ref 15–500)
Eosinophils Relative: 1.7 %
HCT: 31 % — ABNORMAL LOW (ref 38.5–50.0)
Hemoglobin: 10.2 g/dL — ABNORMAL LOW (ref 13.2–17.1)
Lymphs Abs: 1355 cells/uL (ref 850–3900)
MCH: 29.7 pg (ref 27.0–33.0)
MCHC: 32.9 g/dL (ref 32.0–36.0)
MCV: 90.4 fL (ref 80.0–100.0)
MPV: 10.4 fL (ref 7.5–12.5)
Monocytes Relative: 11.5 %
Neutro Abs: 3272 cells/uL (ref 1500–7800)
Neutrophils Relative %: 60.6 %
Platelets: 226 10*3/uL (ref 140–400)
RBC: 3.43 10*6/uL — ABNORMAL LOW (ref 4.20–5.80)
RDW: 13.1 % (ref 11.0–15.0)
Total Lymphocyte: 25.1 %
WBC: 5.4 10*3/uL (ref 3.8–10.8)

## 2019-09-02 LAB — BASIC METABOLIC PANEL
BUN/Creatinine Ratio: 24 (calc) — ABNORMAL HIGH (ref 6–22)
BUN: 27 mg/dL — ABNORMAL HIGH (ref 7–25)
CO2: 29 mmol/L (ref 20–32)
Calcium: 9.5 mg/dL (ref 8.6–10.3)
Chloride: 102 mmol/L (ref 98–110)
Creat: 1.14 mg/dL (ref 0.70–1.18)
Glucose, Bld: 119 mg/dL — ABNORMAL HIGH (ref 65–99)
Potassium: 4.7 mmol/L (ref 3.5–5.3)
Sodium: 139 mmol/L (ref 135–146)

## 2019-09-02 LAB — SEDIMENTATION RATE: Sed Rate: 36 mm/h — ABNORMAL HIGH (ref 0–20)

## 2019-09-02 LAB — C-REACTIVE PROTEIN: CRP: 1.1 mg/L (ref <8.0)

## 2019-09-03 ENCOUNTER — Ambulatory Visit
Admission: RE | Admit: 2019-09-03 | Discharge: 2019-09-03 | Disposition: A | Discharge status: Home / Self Care | Payer: BLUE CROSS/BLUE SHIELD | Source: Ambulatory Visit | Attending: Internal Medicine | Admitting: Internal Medicine

## 2019-09-03 ENCOUNTER — Other Ambulatory Visit: Payer: Self-pay

## 2019-09-03 DIAGNOSIS — G062 Extradural and subdural abscess, unspecified: Secondary | ICD-10-CM

## 2019-09-04 ENCOUNTER — Other Ambulatory Visit: Payer: Self-pay | Admitting: Internal Medicine

## 2019-09-04 DIAGNOSIS — G062 Extradural and subdural abscess, unspecified: Secondary | ICD-10-CM

## 2019-09-05 ENCOUNTER — Other Ambulatory Visit: Payer: Self-pay

## 2019-09-05 ENCOUNTER — Ambulatory Visit
Admission: RE | Admit: 2019-09-05 | Discharge: 2019-09-05 | Disposition: A | Discharge status: Home / Self Care | Payer: BLUE CROSS/BLUE SHIELD | Source: Ambulatory Visit | Attending: Internal Medicine | Admitting: Internal Medicine

## 2019-09-05 ENCOUNTER — Telehealth: Payer: Self-pay | Admitting: Cardiology

## 2019-09-05 DIAGNOSIS — G062 Extradural and subdural abscess, unspecified: Secondary | ICD-10-CM

## 2019-09-05 IMAGING — MR MR THORACIC SPINE WO/W CM
4 of 8 series · 22 of 48 positions shown · IV contrast (multihance)
Comparison: None.

CLINICAL DATA: Epidural abscess.

EXAM:
MRI THORACIC WITHOUT AND WITH CONTRAST
TECHNIQUE: Multiplanar and multiecho pulse sequences of the thoracic spine were
obtained without and with intravenous contrast.
CONTRAST:  15mL MULTIHANCE GADOBENATE DIMEGLUMINE 529 MG/ML IV SOLN

[Series 5: T1 · sagittal · 4.0mm · 0.53mm/px · 3 of 16 slices shown (1 of 2)]
[im 1/16]
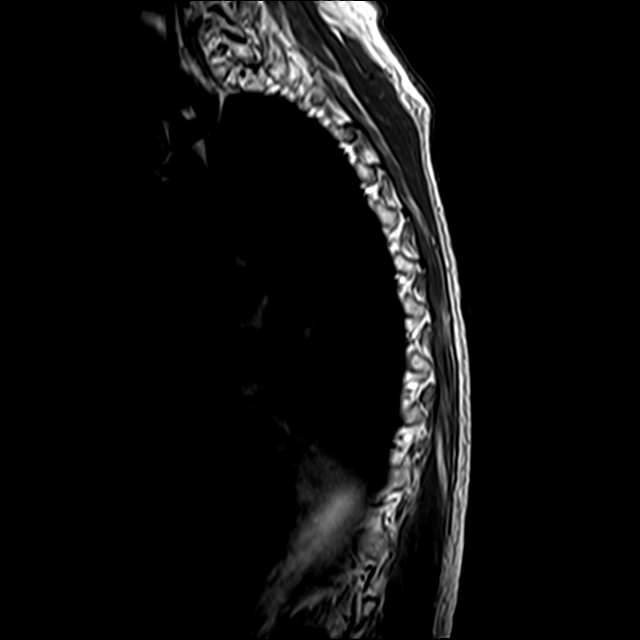
[im 11/16]
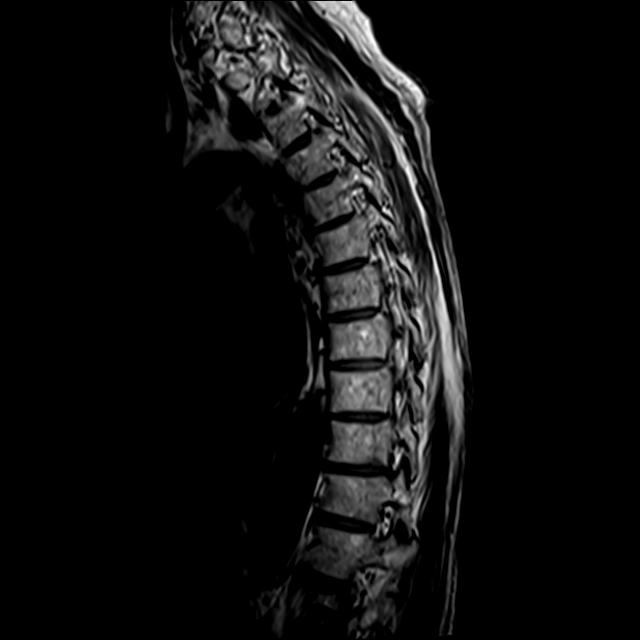
[im 16/16]
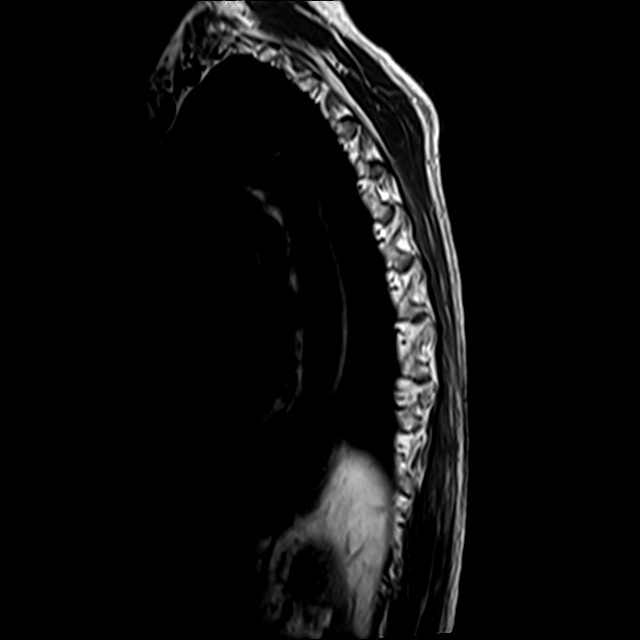

[Series 6: T2 · axial · 4.0mm · 0.39mm/px · z∈[-292,-93]mm · 8 of 30 slices shown (1 of 2)]
[im 1/30]
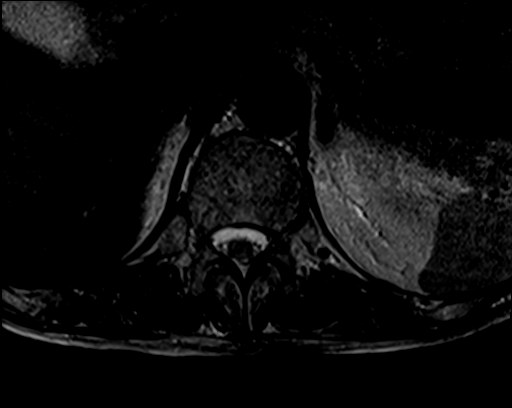
[im 5/30]
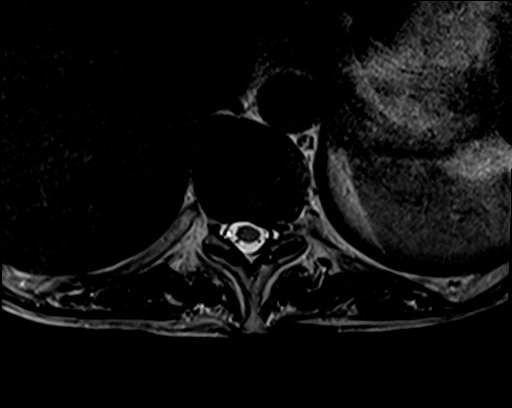
[im 9/30]
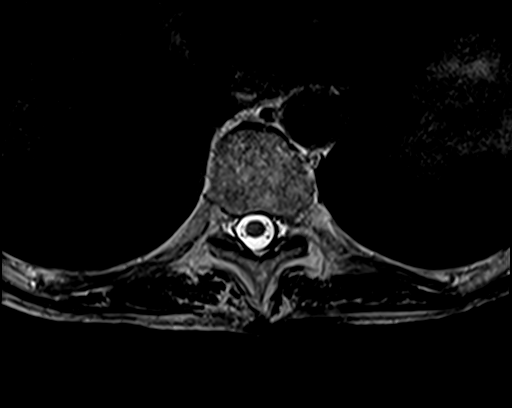
[im 13/30]
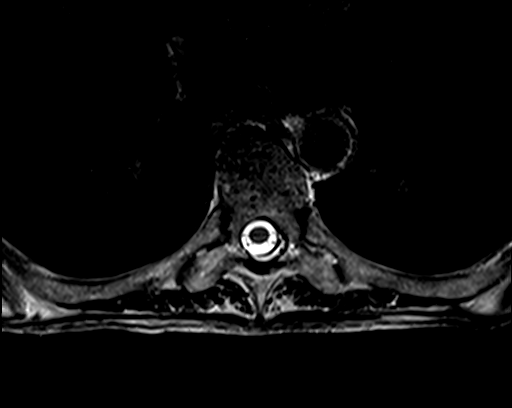
[im 17/30]
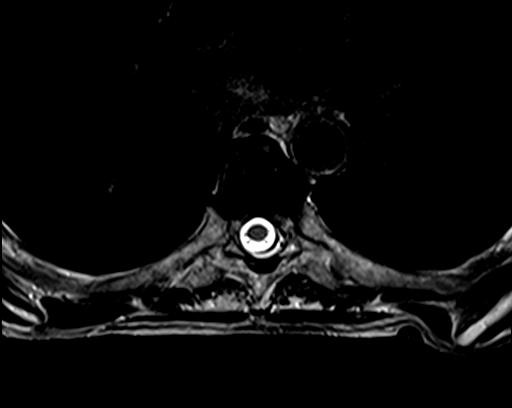
[im 21/30]
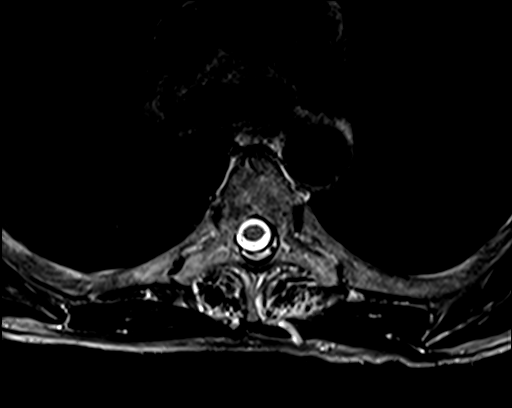
[im 25/30]
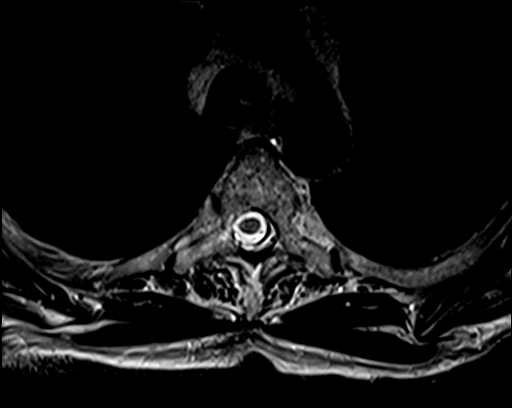
[im 30/30]
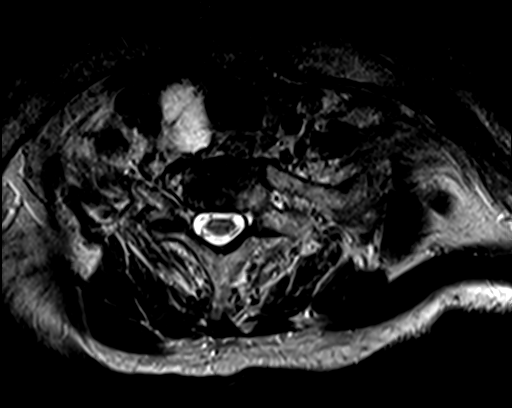

[Series 7: T2 · axial · 4.0mm · 0.39mm/px · z∈[-292,-93]mm · 8 of 30 slices shown (2 of 2)]
[im 1/30]
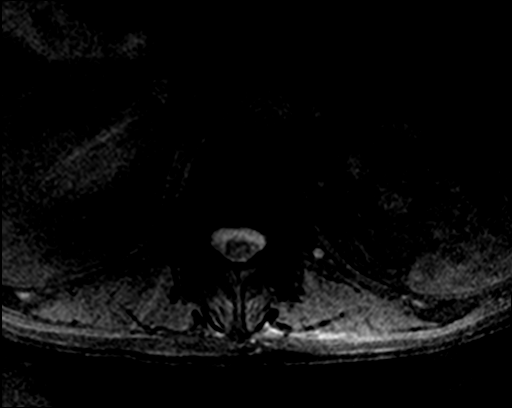
[im 5/30]
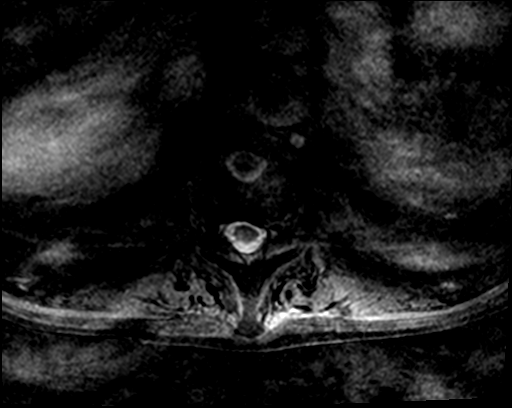
[im 9/30]
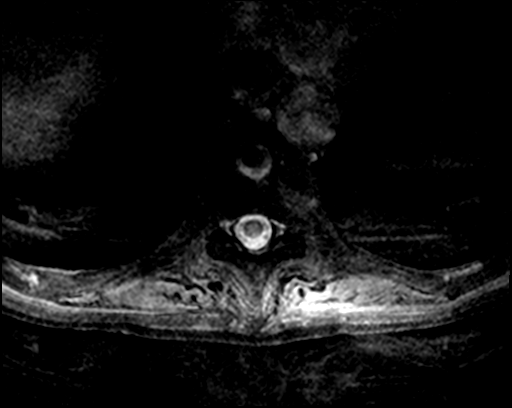
[im 13/30]
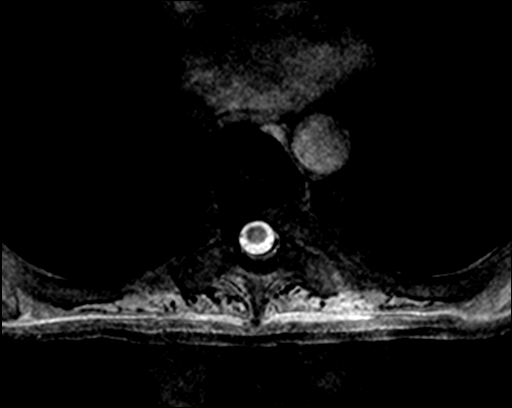
[im 17/30]
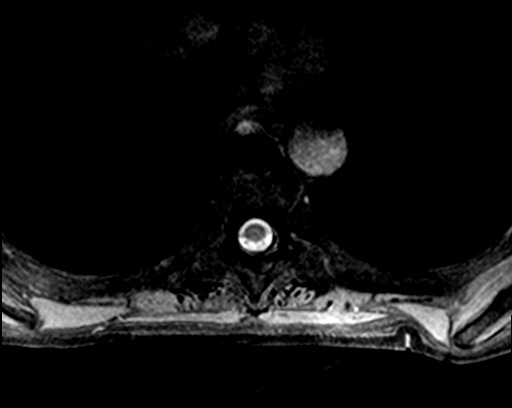
[im 21/30]
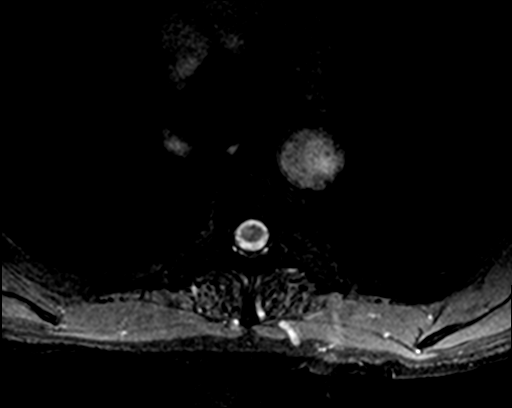
[im 25/30]
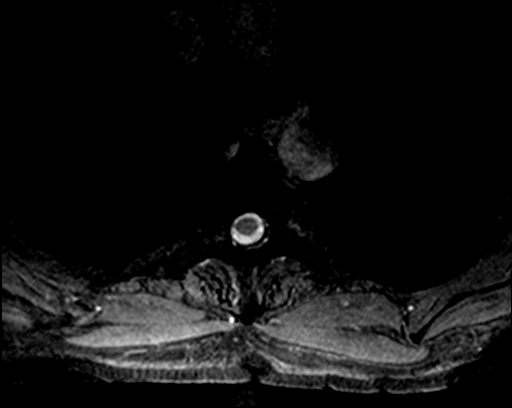
[im 30/30]
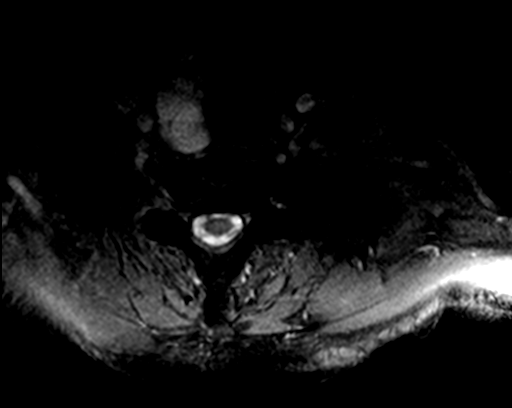

[Series 8: T1 · axial · 4.0mm · 0.39mm/px · z∈[-261,-139]mm · 3 of 30 slices shown (2 of 2)]
[im 5/30]
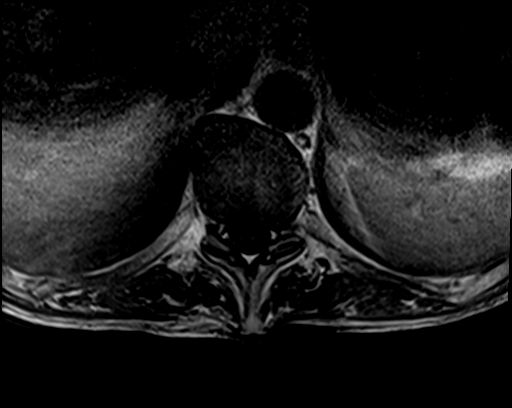
[im 17/30]
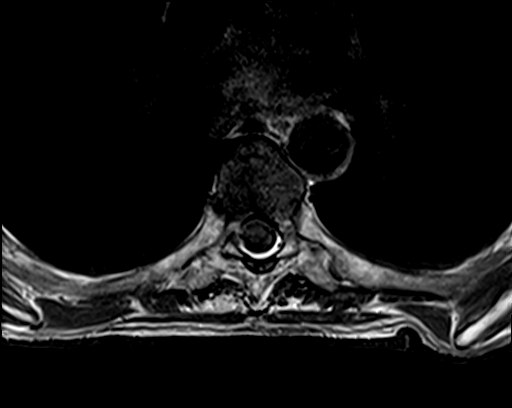
[im 25/30]
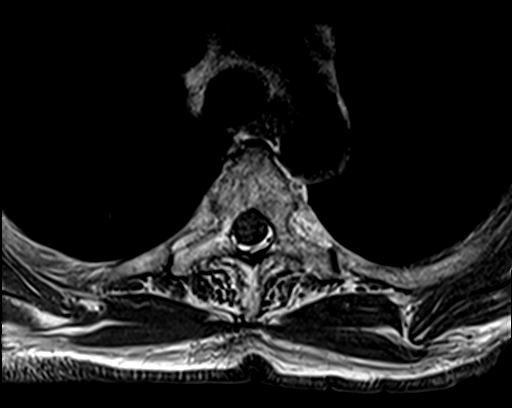

[22 of 48 positions shown; findings below may reference images not displayed]

FINDINGS: The sagittal T2 weighted scan was not obtained.

MRI THORACIC SPINE FINDINGS

Alignment:  Exaggerated thoracic kyphosis with mild levoscoliosis.

Vertebrae: No fracture, evidence of discitis, or bone lesion.

Cord: Normal signal and morphology. No abnormal intrathecal
enhancement or collection.

Paraspinal and other soft tissues: Nonenhancing 3 cm cyst at the
right thyroid

Disc levels:

Age related disc space narrowing and desiccation with tiny endplate
spurs. No degenerative impingement
IMPRESSION: 1. No thoracic abscess or other infectious finding.
2. Mild levoscoliosis with ordinary degenerative changes. No
degenerative impingement.

## 2019-09-05 IMAGING — MR MR LUMBAR SPINE WO/W CM
7 series · 48 of 48 positions shown · IV contrast (15ml multihance)
Comparison: [DATE] lumbar spine CT.  [DATE] lumbar MRI

CLINICAL DATA: History of back surgery [DATE].  Now with abscess.

EXAM:
MRI LUMBAR SPINE WITHOUT AND WITH CONTRAST
TECHNIQUE: Multiplanar and multiecho pulse sequences of the lumbar spine were
obtained without and with intravenous contrast.
CONTRAST:  15mL MULTIHANCE GADOBENATE DIMEGLUMINE 529 MG/ML IV SOLN

[Series 2: tirm sag · sagittal · 4.0mm · 0.55mm/px · 3 of 15 slices shown]
[im 1/15]
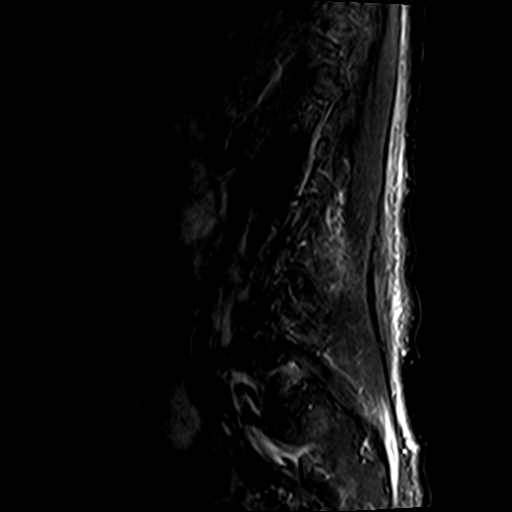
[im 8/15]
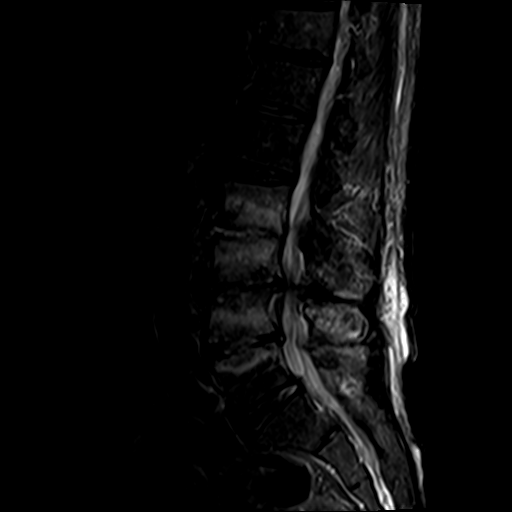
[im 15/15]
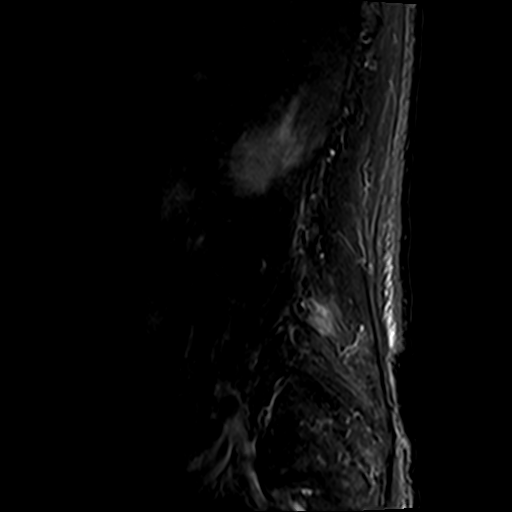

[Series 3: T1 · sagittal · 4.0mm · 0.88mm/px · 4 of 15 slices shown (1 of 2)]
[im 1/15]
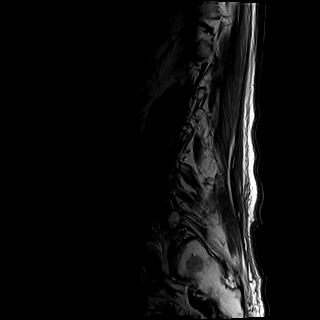
[im 5/15]
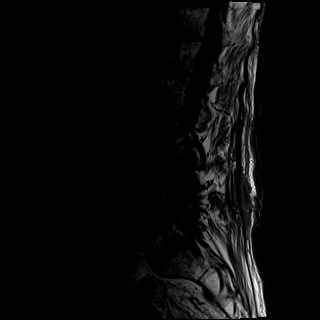
[im 10/15]
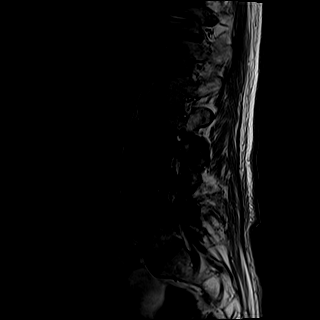
[im 15/15]
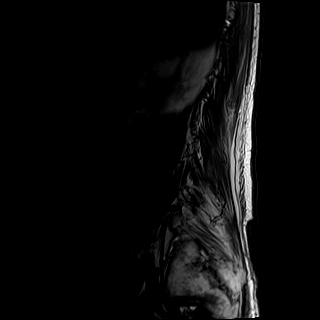

[Series 4: T1 · axial · 4.0mm · 0.78mm/px · z∈[-501,-290]mm · 11 of 39 slices shown (2 of 2)]
[im 1/39]
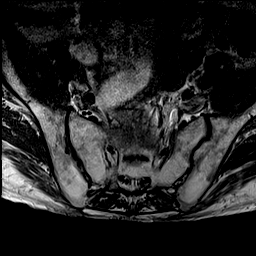
[im 4/39]
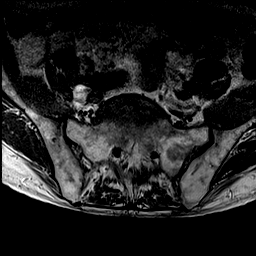
[im 8/39]
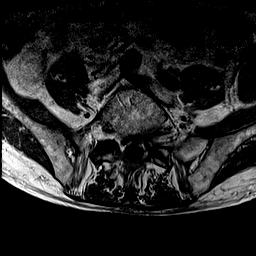
[im 12/39]
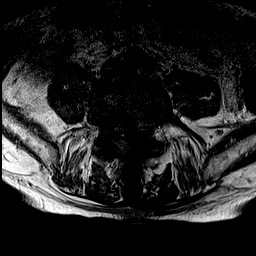
[im 16/39]
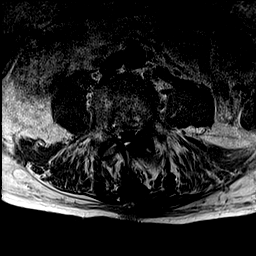
[im 20/39]
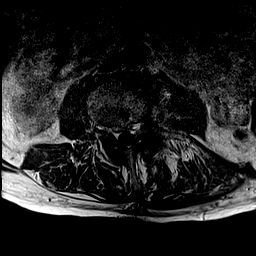
[im 23/39]
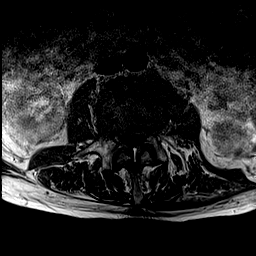
[im 27/39]
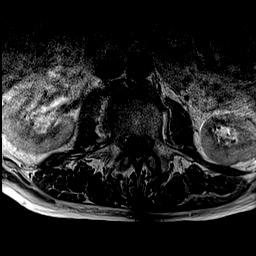
[im 31/39]
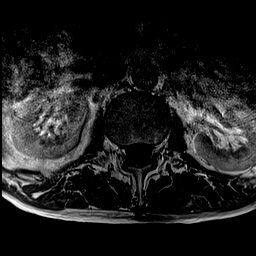
[im 35/39]
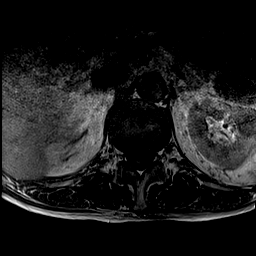
[im 39/39]
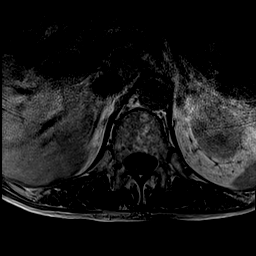

[Series 5: T2 · axial · 4.0mm · 0.78mm/px · z∈[-501,-290]mm · 11 of 39 slices shown]
[im 1/39]
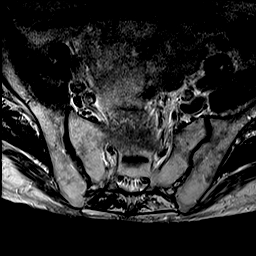
[im 4/39]
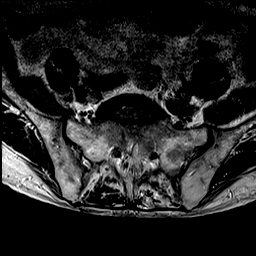
[im 8/39]
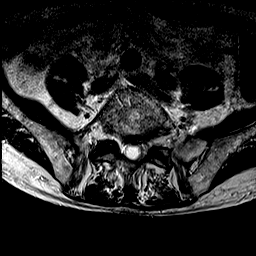
[im 12/39]
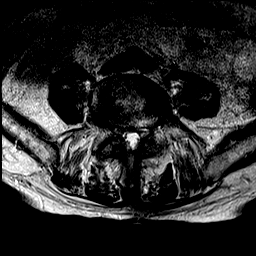
[im 16/39]
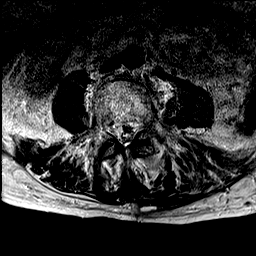
[im 20/39]
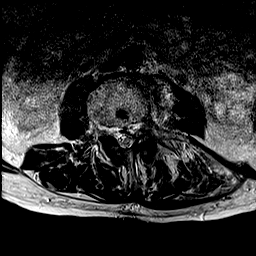
[im 23/39]
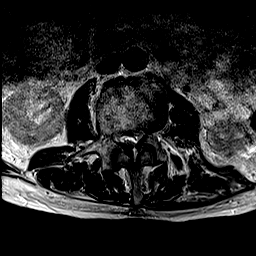
[im 27/39]
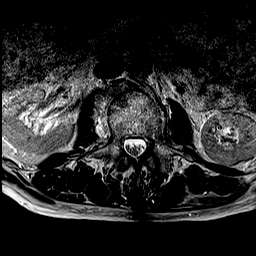
[im 31/39]
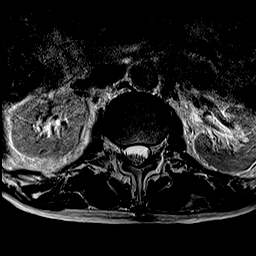
[im 35/39]
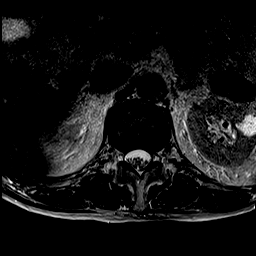
[im 39/39]
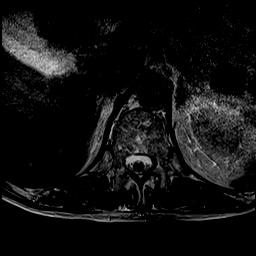

[Series 6: T2 post-contrast · sagittal · 4.0mm · 0.88mm/px · 4 of 15 slices shown]
[im 1/15]
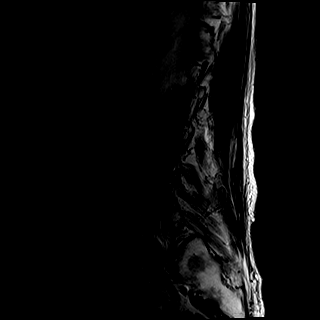
[im 5/15]
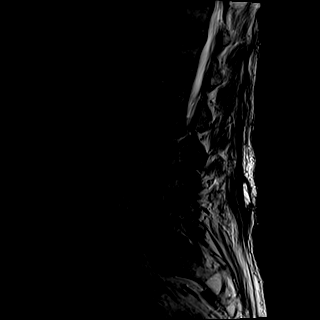
[im 10/15]
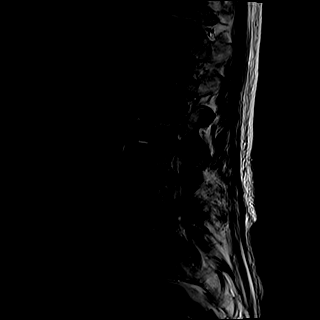
[im 15/15]
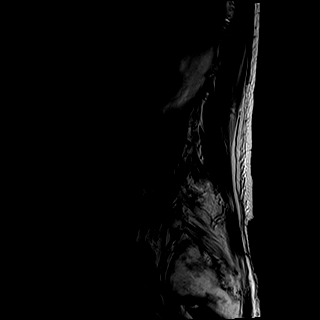

[Series 7: T1 fat-sat post-contrast · sagittal · 4.0mm · 0.88mm/px · 4 of 15 slices shown]
[im 1/15]
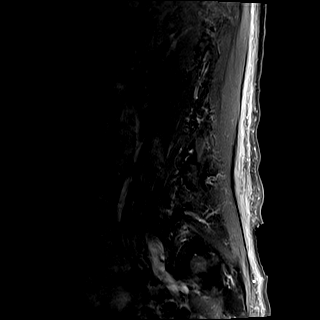
[im 5/15]
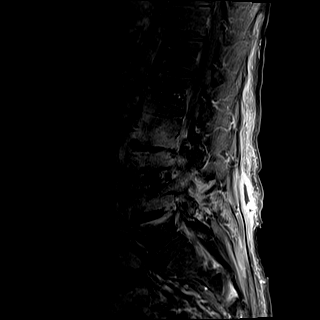
[im 10/15]
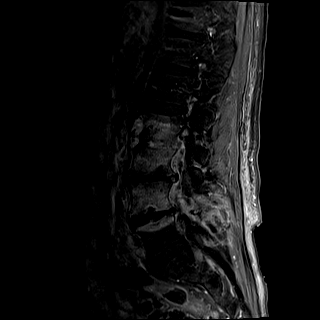
[im 15/15]
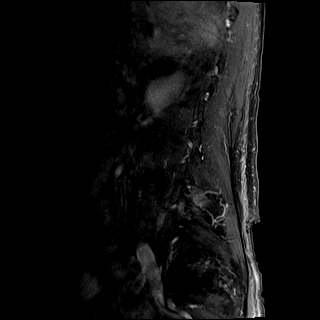

[Series 8: T1 post-contrast · axial · 4.0mm · 0.78mm/px · z∈[-501,-290]mm · 11 of 39 slices shown]
[im 1/39]
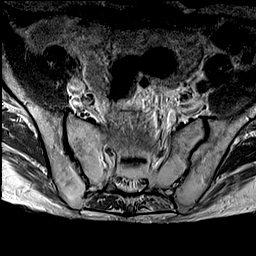
[im 4/39]
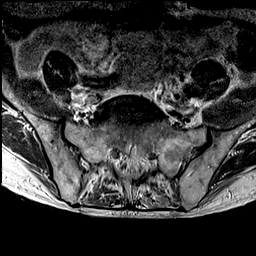
[im 8/39]
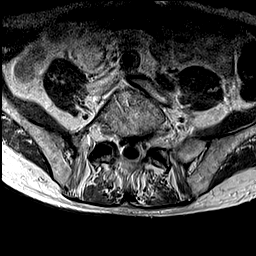
[im 12/39]
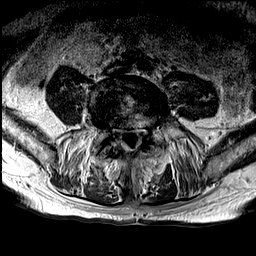
[im 16/39]
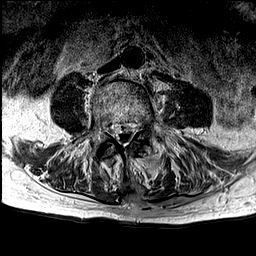
[im 20/39]
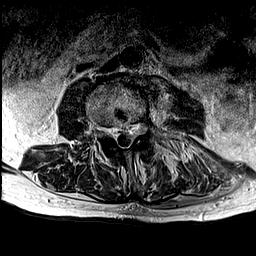
[im 23/39]
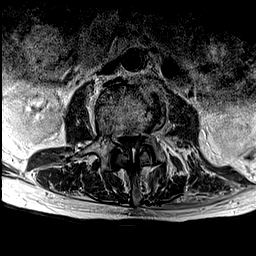
[im 27/39]
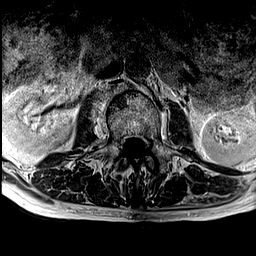
[im 31/39]
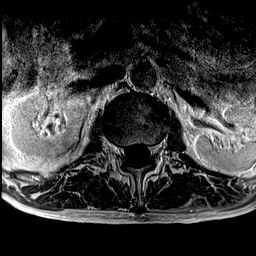
[im 35/39]
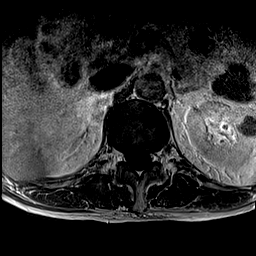
[im 39/39]
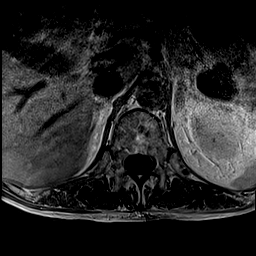

[48 of 48 positions shown; findings below may reference images not displayed]

FINDINGS: Segmentation:  Standard lumbar numbering

Alignment:  Mild dextroscoliotic curvature.

Vertebrae: Endplate and disc edema at L2-3 to L4-5 with endplate
erosions by CT. These findings are progressive, especially at L4-5
where there is new disc space involvement. There is new facet
involvement on the right at L4-5 and left at L3-4. No superimposed
fracture.

Conus medullaris and cauda equina: Conus extends to the T12-L1
level. Cauda equina distortion due to compression. Epidural
thickening and enhancement (phlegmonous change) at L3 to L5 but no
residual fluid collection is seen.

Paraspinal and other soft tissues: The drainage catheter has been
removed in the residual fluid in the subcutaneous incisional region
has diminished to 10 x 3 mm.

Disc levels:

T12- L1: Unremarkable.

L1-L2: Unremarkable.

L2-L3: Disc narrowing and bulging with degenerative facet spurring.
Moderate spinal and right more than left foraminal stenosis

L3-L4: Disc narrowing and bulging with asymmetric left-sided
collapse. Asymmetric left facet spurring. Severe spinal stenosis and
left foraminal impingement.

L4-L5: Disc narrowing and bulging with posterior element
hypertrophy. Prior left-sided laminotomy at this level. The thecal
sac is patent. There is advanced right foraminal impingement.

L5-S1:Small central disc protrusion.  No neural compression.
IMPRESSION: 1. Compared to CT [DATE], interval removal of the subcutaneous
catheter and diminished subcutaneous collection now measuring 10 x 3
mm.
2. Discitis/osteomyelitis at L2-3 to L4-5 and facet arthritis at
L3-4 and L[DATE]-progressed from [DATE] MRI.
3. Epidural phlegmon at L3 and L4 without spinal canal collection.
4. Degenerative spinal stenosis that is advanced at L3-4.
5. Foraminal impingement at L2-3 to L4-5, particularly advanced on
the left at L3-4 and right at L4-5.

## 2019-09-05 MED ORDER — GADOBENATE DIMEGLUMINE 529 MG/ML IV SOLN
15.0000 mL | Freq: Once | INTRAVENOUS | Status: AC | PRN
  Administered 2019-09-05: 15 mL via INTRAVENOUS

## 2019-09-05 NOTE — Telephone Encounter (Signed)
  1. Is this related to a heart monitor you are wearing?  (If the patient says no, please ask     if they are caling about ICD/pacemaker.) yes  2. What is your issue??    Per Barbara Cower at [facility] patient had an mri Wednesday and did not inform staff he was wearing a monitor.    They are bringing the patient back to re scan areas with artifact and patient did not sustain injury.  They were not sure how the monitor will be effected .        Please route to covering RN/CMA/RMA for results. Route to monitor technicians or your monitor tech representative for your site for any technical concerns

## 2019-09-16 ENCOUNTER — Ambulatory Visit: Payer: Medicare Other | Admitting: Internal Medicine

## 2019-09-17 NOTE — Progress Notes (Signed)
Subjective:   Neil Cordova is a 79 y.o. male who presents for an Initial Medicare Annual Wellness Visit.  I connected with Kirill today by telephone and verified that I am speaking with the correct person using two identifiers. Location patient: home Location provider: work Persons participating in the virtual visit: patient, Engineer, civil (consulting).    I discussed the limitations, risks, security and privacy concerns of performing an evaluation and management service by telephone and the availability of in person appointments. I also discussed with the patient that there may be a patient responsible charge related to this service. The patient expressed understanding and verbally consented to this telephonic visit.    Interactive audio and video telecommunications were attempted between this provider and patient, however failed, due to patient having technical difficulties OR patient did not have access to video capability.  We continued and completed visit with audio only.  Some vital signs may be absent or patient reported.   Time Spent with patient on telephone encounter: 30 minutes  Review of Systems     Cardiac Risk Factors include: advanced age (>43men, >58 women);male gender     Objective:    Today's Vitals   09/18/19 0941 09/18/19 0942  Weight: 147 lb (66.7 kg)   Height: 5\' 10"  (1.778 m)   PainSc:  7    Body mass index is 21.09 kg/m.  Advanced Directives 09/18/2019 07/22/2019  Does Patient Have a Medical Advance Directive? Yes No  Type of 07/24/2019 of [place];Living will -  Copy of Healthcare Power of Attorney in Chart? No - copy requested -  Would patient like information on creating a medical advance directive? - Yes (ED - Information included in AVS)    Current Medications (verified) Outpatient Encounter Medications as of 09/18/2019  Medication Sig  . acetaminophen (TYLENOL) 325 MG tablet Take 650 mg by mouth every 6 (six) hours as needed for mild pain,  moderate pain or headache.   11/18/2019 apixaban (ELIQUIS) 5 MG TABS tablet Take 1 tablet (5 mg total) by mouth 2 (two) times daily.  . cephALEXin (KEFLEX) 500 MG capsule Take 1 capsule (500 mg total) by mouth 4 (four) times daily.  . fluticasone (FLONASE) 50 MCG/ACT nasal spray Place 1 spray into both nostrils daily as needed for allergies or rhinitis.  . Iron, Ferrous Gluconate, 256 (28 Fe) MG TABS Take one daily  . loratadine (CLARITIN) 10 MG tablet Take 10 mg by mouth daily as needed for allergies.  . metoprolol succinate (TOPROL-XL) 25 MG 24 hr tablet Take 0.5 tablets (12.5 mg total) by mouth daily.  Marland Kitchen PARoxetine (PAXIL) 30 MG tablet Take 30 mg by mouth every evening.  . polyethylene glycol powder (GLYCOLAX/MIRALAX) 17 GM/SCOOP powder Take 17 g by mouth daily as needed for mild constipation.   . Probiotic Product (PROBIOTIC DAILY) CAPS Take 1 capsule by mouth daily.  . psyllium (METAMUCIL) 58.6 % packet Take 1 packet by mouth See admin instructions. Mix and drink 1 packet by mouth every other night  . QUEtiapine (SEROQUEL) 50 MG tablet Take 1 tablet (50 mg total) by mouth at bedtime.  . [DISCONTINUED] furosemide (LASIX) 40 MG tablet Take 1 tablet (40 mg total) by mouth daily as needed for edema (weight gain of 3lbs in 24 hours or 5lbs in 1 week).  . [DISCONTINUED] lisinopril (ZESTRIL) 10 MG tablet Take 10 mg by mouth at bedtime.   . [DISCONTINUED] potassium chloride SA (KLOR-CON) 20 MEQ tablet Take 1 tablet (20 mEq total)  by mouth daily as needed (take only w/lasix).   No facility-administered encounter medications on file as of 09/18/2019.    Allergies (verified) Patient has no known allergies.   History: Past Medical History:  Diagnosis Date  . Anxiety   . Hypertension    Past Surgical History:  Procedure Laterality Date  . BACK SURGERY    . CARDIOVERSION N/A 07/25/2019   Procedure: CARDIOVERSION;  Surgeon: Wendall Stade, MD;  Location: [facility] ENDOSCOPY;  Service: Cardiovascular;   Laterality: N/A;  . IR RADIOLOGIST EVAL & MGMT  08/13/2019  . IR US GUIDE BX ASP/DRAIN  07/05/2019  . LUMBAR WOUND DEBRIDEMENT N/A 07/01/2019   Procedure: LUMBAR WOUND EXPLORATION;  Surgeon: Lisbeth Renshaw, MD;  Location: [facility] OR;  Service: Neurosurgery;  Laterality: N/A;  posterior  . TEE WITHOUT CARDIOVERSION N/A 07/04/2019   Procedure: TRANSESOPHAGEAL ECHOCARDIOGRAM (TEE);  Surgeon: Elease Hashimoto Deloris Ping, MD;  Location: [facility] ENDOSCOPY;  Service: Cardiovascular;  Laterality: N/A;  . TEE WITHOUT CARDIOVERSION N/A 07/25/2019   Procedure: TRANSESOPHAGEAL ECHOCARDIOGRAM (TEE);  Surgeon: Wendall Stade, MD;  Location: [facility] ENDOSCOPY;  Service: Cardiovascular;  Laterality: N/A;   Family History  Problem Relation Age of Onset  . Emphysema Father   . CAD Neg Hx   . Heart failure Neg Hx    Social History   Socioeconomic History  . Marital status: Married    Spouse name: Not on file  . Number of children: Not on file  . Years of education: Not on file  . Highest education level: Not on file  Occupational History  . Not on file  Tobacco Use  . Smoking status: Former Smoker    Quit date: 1972    Years since quitting: 49.6  . Smokeless tobacco: Current User    Types: Snuff  Vaping Use  . Vaping Use: Never used  Substance and Sexual Activity  . Alcohol use: Not Currently  . Drug use: Never  . Sexual activity: Not on file  Other Topics Concern  . Not on file  Social History Narrative  . Not on file   Social Determinants of Health   Financial Resource Strain: Low Risk   . Difficulty of Paying Living Expenses: Not hard at all  Food Insecurity: No Food Insecurity  . Worried About Programme researcher, broadcasting/film/video in the Last Year: Never true  . Ran Out of Food in the Last Year: Never true  Transportation Needs: No Transportation Needs  . Lack of Transportation (Medical): No  . Lack of Transportation (Non-Medical): No  Physical Activity: Sufficiently Active  . Days of Exercise per Week: 5 days  . Minutes  of Exercise per Session: 60 min  Stress: No Stress Concern Present  . Feeling of Stress : Not at all  Social Connections: Socially Integrated  . Frequency of Communication with Friends and Family: More than three times a week  . Frequency of Social Gatherings with Friends and Family: More than three times a week  . Attends Religious Services: More than 4 times per year  . Active Member of Clubs or Organizations: Yes  . Attends Banker Meetings: More than 4 times per year  . Marital Status: Married    Tobacco Counseling Ready to quit: Not Answered Counseling given: Not Answered   Clinical Intake:  Pre-visit preparation completed: Yes  Pain : 0-10 Pain Score: 7  Pain Type: Chronic pain Pain Location: Back Pain Onset: More than a month ago Pain Frequency: Intermittent Pain Relieving Factors: heating pad,  tylenol  Pain Relieving Factors: heating pad, tylenol  Nutritional Status: BMI of 19-24  Normal Nutritional Risks: Non-healing wound (Patient states he has a sore area on his tailbone that has been present for a long time. He says the skin is slightly broken. He is putting "new skin" on it & says it helps some. He states he has seen numerous doctors about it.) Diabetes: No  Patient advised to make an appt to be seen if sore on his tailbone worsens, Patient voiced understanding.  How often do you need to have someone help you when you read instructions, pamphlets, or other written materials from your doctor or pharmacy?: 1 - Never  Diabetic?No  Interpreter Needed?: No  Information entered by :: Thomasenia Sales LPN   Activities of Daily Living In your present state of health, do you have any difficulty performing the following activities: 09/18/2019 07/22/2019  Hearing? N N  Vision? Y N  Difficulty concentrating or making decisions? N N  Walking or climbing stairs? N N  Dressing or bathing? N N  Doing errands, shopping? N N  Preparing Food and eating ? N -    Using the Toilet? N -  In the past six months, have you accidently leaked urine? N -  Do you have problems with loss of bowel control? N -  Managing your Medications? N -  Managing your Finances? N -  Housekeeping or managing your Housekeeping? N -    Patient Care Team: Mliss Sax, MD as PCP - General (Family Medicine) Jodelle Red, MD as PCP - Cardiology (Cardiology)  Indicate any recent Medical Services you may have received from other than [facility] providers in the past year (date may be approximate).     Assessment:   This is a routine wellness examination for Treylin.  Hearing/Vision screen  Hearing Screening   125Hz  250Hz  500Hz  1000Hz  2000Hz  3000Hz  4000Hz  6000Hz  8000Hz   Right ear:           Left ear:           Comments: Hearing aids  Vision Screening Comments: Wears glasses Last eye exam-03/2019  Dietary issues and exercise activities discussed: Current Exercise Habits: Home exercise routine, Type of exercise: walking, Time (Minutes): 60, Frequency (Times/Week): 7, Weekly Exercise (Minutes/Week): 420, Intensity: Mild  Goals    . Patient Stated     Continue current level of acitivity      Depression Screen PHQ 2/9 Scores 09/18/2019 07/31/2019  PHQ - 2 Score 0 3  PHQ- 9 Score - 9    Fall Risk Fall Risk  09/18/2019  Falls in the past year? 0  Number falls in past yr: 0  Injury with Fall? 0  Follow up Falls prevention discussed    Any stairs in or around the home? No  Home free of loose throw rugs in walkways, pet beds, electrical cords, etc? Yes  Adequate lighting in your home to reduce risk of falls? Yes   ASSISTIVE DEVICES UTILIZED TO PREVENT FALLS:  Life alert? No  Use of a cane, walker or w/c? No  Grab bars in the bathroom? Yes  Shower chair or bench in shower? No  Elevated toilet seat or a handicapped toilet? No   TIMED UP AND GO:  Was the test performed? No .Phone visit    Cognitive Function:No cognitive impairment noted.         Immunizations Immunization History  Administered Date(s) Administered  . Influenza, High Dose Seasonal PF 12/06/2017  . PFIZER  SARS-COV-2 Vaccination 02/18/2019, 03/10/2019    TDAP status: Due, Education has been provided regarding the importance of this vaccine. Advised may receive this vaccine at local pharmacy or Health Dept. Aware to provide a copy of the vaccination record if obtained from local pharmacy or Health Dept. Verbalized acceptance and understanding.   Flu Vaccine status: Up to date Due 10/2019  Pneumococcal vaccine status: Up to date -Per patient.   Covid-19 vaccine status: Completed vaccines  Qualifies for Shingles Vaccine? No   Zostavax completed No   Shingrix Completed?: Yes Per patient  Screening Tests Health Maintenance  Topic Date Due  . Hepatitis C Screening  Never done  . URINE MICROALBUMIN  Never done  . TETANUS/TDAP  Never done  . INFLUENZA VACCINE  09/07/2019  . PNA vac Low Risk Adult (1 of 2 - PCV13) 09/17/2020 (Originally 06/21/2006)  . COVID-19 Vaccine  Completed    Health Maintenance  Health Maintenance Due  Topic Date Due  . Hepatitis C Screening  Never done  . URINE MICROALBUMIN  Never done  . TETANUS/TDAP  Never done  . INFLUENZA VACCINE  09/07/2019    Colorectal cancer screening: No longer required.   Lung Cancer Screening: (Low Dose CT Chest recommended if Age 35-80 years, 30 pack-year currently smoking OR have quit w/in 15years.) does not qualify.    Additional Screening:  Hepatitis C Screening: Discuss with PCP  Vision Screening: Recommended annual ophthalmology exams for early detection of glaucoma and other disorders of the eye. Is the patient up to date with their annual eye exam?  Yes  Who is the provider or what is the name of the office in which the patient attends annual eye exams? Unsure of name  Dental Screening: Recommended annual dental exams for proper oral hygiene  Community Resource Referral / Chronic Care  Management: CRR required this visit?  No   CCM required this visit?  No      Plan:     I have personally reviewed and noted the following in the patient's chart:   . Medical and social history . Use of alcohol, tobacco or illicit drugs  . Current medications and supplements . Functional ability and status . Nutritional status . Physical activity . Advanced directives . List of other physicians . Hospitalizations, surgeries, and ER visits in previous 12 months . Vitals . Screenings to include cognitive, depression, and falls . Referrals and appointments  In addition, I have reviewed and discussed with patient certain preventive protocols, quality metrics, and best practice recommendations. A written personalized care plan for preventive services as well as general preventive health recommendations were provided to patient.  Due to this being a telephonic visit, the after visit summary with patients personalized plan was offered to patient via mail or my-chart. Patient would like to access on my-chart.     Roanna RaiderMartha A Edwinna Rochette, LPN   1/61/09608/01/2020  Nurse Health Advisor  Nurse Notes: None

## 2019-09-18 ENCOUNTER — Ambulatory Visit (INDEPENDENT_AMBULATORY_CARE_PROVIDER_SITE_OTHER): Payer: Medicare Other

## 2019-09-18 VITALS — Ht 70.0 in | Wt 147.0 lb

## 2019-09-18 DIAGNOSIS — Z0000 Encounter for general adult medical examination without abnormal findings: Secondary | ICD-10-CM

## 2019-09-18 NOTE — Patient Instructions (Signed)
[name] , Thank you for taking time to complete your Medicare Wellness Visit. I appreciate your ongoing commitment to your health goals. Please review the following plan we discussed and let me know if I can assist you in the future.   Screening recommendations/referrals: Colonoscopy: No longer indicated Recommended yearly ophthalmology/optometry visit for glaucoma screening and checkup Recommended yearly dental visit for hygiene and checkup  Vaccinations: Influenza vaccine: Due 10/2019 Pneumococcal vaccine: Completed per our conversation today. Tdap vaccine: Discuss with pharmacy Shingles vaccine: Completed vaccines per our conversation today.   Covid-19: Completed vaccines  Advanced directives: Please bring a copy for your chart when available  Conditions/risks identified: See problem list  Next appointment: Follow up in one year for your annual wellness visit.   Preventive Care 55 Years and Older, Male Preventive care refers to lifestyle choices and visits with your health care provider that can promote health and wellness. What does preventive care include?  A yearly physical exam. This is also called an annual well check.  Dental exams once or twice a year.  Routine eye exams. Ask your health care provider how often you should have your eyes checked.  Personal lifestyle choices, including:  Daily care of your teeth and gums.  Regular physical activity.  Eating a healthy diet.  Avoiding tobacco and drug use.  Limiting alcohol use.  Practicing safe sex.  Taking low doses of aspirin every day.  Taking vitamin and mineral supplements as recommended by your health care provider. What happens during an annual well check? The services and screenings done by your health care provider during your annual well check will depend on your age, overall health, lifestyle risk factors, and family history of disease. Counseling  Your health care provider may ask you questions  about your:  Alcohol use.  Tobacco use.  Drug use.  Emotional well-being.  Home and relationship well-being.  Sexual activity.  Eating habits.  History of falls.  Memory and ability to understand (cognition).  Work and work Astronomer. Screening  You may have the following tests or measurements:  Height, weight, and BMI.  Blood pressure.  Lipid and cholesterol levels. These may be checked every 5 years, or more frequently if you are over 74 years old.  Skin check.  Lung cancer screening. You may have this screening every year starting at age 65 if you have a 30-pack-year history of smoking and currently smoke or have quit within the past 15 years.  Fecal occult blood test (FOBT) of the stool. You may have this test every year starting at age 60.  Flexible sigmoidoscopy or colonoscopy. You may have a sigmoidoscopy every 5 years or a colonoscopy every 10 years starting at age 63.  Prostate cancer screening. Recommendations will vary depending on your family history and other risks.  Hepatitis C blood test.  Hepatitis B blood test.  Sexually transmitted disease (STD) testing.  Diabetes screening. This is done by checking your blood sugar (glucose) after you have not eaten for a while (fasting). You may have this done every 1-3 years.  Abdominal aortic aneurysm (AAA) screening. You may need this if you are a current or former smoker.  Osteoporosis. You may be screened starting at age 72 if you are at high risk. Talk with your health care provider about your test results, treatment options, and if necessary, the need for more tests. Vaccines  Your health care provider may recommend certain vaccines, such as:  Influenza vaccine. This is recommended every year.  Tetanus, diphtheria, and acellular pertussis (Tdap, Td) vaccine. You may need a Td booster every 10 years.  Zoster vaccine. You may need this after age 69.  Pneumococcal 13-valent conjugate (PCV13)  vaccine. One dose is recommended after age 46.  Pneumococcal polysaccharide (PPSV23) vaccine. One dose is recommended after age 18. Talk to your health care provider about which screenings and vaccines you need and how often you need them. This information is not intended to replace advice given to you by your health care provider. Make sure you discuss any questions you have with your health care provider. Document Released: 02/19/2015 Document Revised: 10/13/2015 Document Reviewed: 11/24/2014 Elsevier Interactive Patient Education  2017 [place] Prevention in the Home Falls can cause injuries. They can happen to people of all ages. There are many things you can do to make your home safe and to help prevent falls. What can I do on the outside of my home?  Regularly fix the edges of walkways and driveways and fix any cracks.  Remove anything that might make you trip as you walk through a door, such as a raised step or threshold.  Trim any bushes or trees on the path to your home.  Use bright outdoor lighting.  Clear any walking paths of anything that might make someone trip, such as rocks or tools.  Regularly check to see if handrails are loose or broken. Make sure that both sides of any steps have handrails.  Any raised decks and porches should have guardrails on the edges.  Have any leaves, snow, or ice cleared regularly.  Use sand or salt on walking paths during winter.  Clean up any spills in your garage right away. This includes oil or grease spills. What can I do in the bathroom?  Use night lights.  Install grab bars by the toilet and in the tub and shower. Do not use towel bars as grab bars.  Use non-skid mats or decals in the tub or shower.  If you need to sit down in the shower, use a plastic, non-slip stool.  Keep the floor dry. Clean up any water that spills on the floor as soon as it happens.  Remove soap buildup in the tub or shower  regularly.  Attach bath mats securely with double-sided non-slip rug tape.  Do not have throw rugs and other things on the floor that can make you trip. What can I do in the bedroom?  Use night lights.  Make sure that you have a light by your bed that is easy to reach.  Do not use any sheets or blankets that are too big for your bed. They should not hang down onto the floor.  Have a firm chair that has side arms. You can use this for support while you get dressed.  Do not have throw rugs and other things on the floor that can make you trip. What can I do in the kitchen?  Clean up any spills right away.  Avoid walking on wet floors.  Keep items that you use a lot in easy-to-reach places.  If you need to reach something above you, use a strong step stool that has a grab bar.  Keep electrical cords out of the way.  Do not use floor polish or wax that makes floors slippery. If you must use wax, use non-skid floor wax.  Do not have throw rugs and other things on the floor that can make you trip. What can I do  with my stairs?  Do not leave any items on the stairs.  Make sure that there are handrails on both sides of the stairs and use them. Fix handrails that are broken or loose. Make sure that handrails are as long as the stairways.  Check any carpeting to make sure that it is firmly attached to the stairs. Fix any carpet that is loose or worn.  Avoid having throw rugs at the top or bottom of the stairs. If you do have throw rugs, attach them to the floor with carpet tape.  Make sure that you have a light switch at the top of the stairs and the bottom of the stairs. If you do not have them, ask someone to add them for you. What else can I do to help prevent falls?  Wear shoes that:  Do not have high heels.  Have rubber bottoms.  Are comfortable and fit you well.  Are closed at the toe. Do not wear sandals.  If you use a stepladder:  Make sure that it is fully  opened. Do not climb a closed stepladder.  Make sure that both sides of the stepladder are locked into place.  Ask someone to hold it for you, if possible.  Clearly mark and make sure that you can see:  Any grab bars or handrails.  First and last steps.  Where the edge of each step is.  Use tools that help you move around (mobility aids) if they are needed. These include:  Canes.  Walkers.  Scooters.  Crutches.  Turn on the lights when you go into a dark area. Replace any light bulbs as soon as they burn out.  Set up your furniture so you have a clear path. Avoid moving your furniture around.  If any of your floors are uneven, fix them.  If there are any pets around you, be aware of where they are.  Review your medicines with your doctor. Some medicines can make you feel dizzy. This can increase your chance of falling. Ask your doctor what other things that you can do to help prevent falls. This information is not intended to replace advice given to you by your health care provider. Make sure you discuss any questions you have with your health care provider. Document Released: 11/19/2008 Document Revised: 07/01/2015 Document Reviewed: 02/27/2014 Elsevier Interactive Patient Education  2017 [employer].

## 2019-09-22 ENCOUNTER — Other Ambulatory Visit: Payer: Self-pay

## 2019-09-22 ENCOUNTER — Encounter: Payer: Self-pay | Admitting: Cardiology

## 2019-09-22 ENCOUNTER — Ambulatory Visit (INDEPENDENT_AMBULATORY_CARE_PROVIDER_SITE_OTHER): Payer: Medicare Other | Admitting: Cardiology

## 2019-09-22 VITALS — BP 138/70 | HR 48 | Temp 97.3°F | Ht 70.0 in | Wt 148.0 lb

## 2019-09-22 DIAGNOSIS — I4892 Unspecified atrial flutter: Secondary | ICD-10-CM

## 2019-09-22 DIAGNOSIS — I429 Cardiomyopathy, unspecified: Secondary | ICD-10-CM | POA: Diagnosis not present

## 2019-09-22 DIAGNOSIS — Q2112 Patent foramen ovale: Secondary | ICD-10-CM

## 2019-09-22 DIAGNOSIS — Q211 Atrial septal defect: Secondary | ICD-10-CM | POA: Diagnosis not present

## 2019-09-22 DIAGNOSIS — I10 Essential (primary) hypertension: Secondary | ICD-10-CM

## 2019-09-22 DIAGNOSIS — Z7189 Other specified counseling: Secondary | ICD-10-CM

## 2019-09-22 NOTE — Patient Instructions (Signed)

## 2019-09-22 NOTE — Progress Notes (Signed)
Cardiology Office Note:    Date:  09/22/2019   ID:  Neil Cordova, DOB October 27, 1940, MRN [id number]  PCP:  Mliss Sax, MD  Cardiologist:  Jodelle Red, MD  Referring MD: Mliss Sax,*   CC: follow up  History of Present Illness:    Neil Cordova is a 79 y.o. male with a hx of persistent atrial flutter, on apixaban, chronic combined systolic and diastolic heart failure, hypertension who is seen in follow up today.   Returned monitor on 8/13, no results back yet.  Doing well overall. Back to running and walking. Blood pressures have been running normal at home. No palpitations. No noted fast heart rates when he checks on his cuff.   Did medication reconciliation today. Tolerating apixaban. No melena/hematochezia/hematuria. Mild bruising but not very easily, similar to before the apixaban. Has questions on leg discoloration, night sweats, feeling cold.  Reviewed his Hgb trend. We only have labs back to 06/2019. Presentation was Hgb 11.2, nadir 8.4, now uptrended to 10.2.  Has a PCP in [place], [place]. Had A1c 4.14.21, was 5.5. A1c was 6.3 two months ago. Asking questions re: this, discussed.  Denies chest pain, shortness of breath at rest or with normal exertion. No PND, orthopnea, LE edema or unexpected weight gain. No syncope or palpitations.  Past Medical History:  Diagnosis Date  . Anxiety   . Hypertension     Past Surgical History:  Procedure Laterality Date  . BACK SURGERY    . CARDIOVERSION N/A 07/25/2019   Procedure: CARDIOVERSION;  Surgeon: Wendall Stade, MD;  Location: [facility] ENDOSCOPY;  Service: Cardiovascular;  Laterality: N/A;  . IR RADIOLOGIST EVAL & MGMT  08/13/2019  . IR US GUIDE BX ASP/DRAIN  07/05/2019  . LUMBAR WOUND DEBRIDEMENT N/A 07/01/2019   Procedure: LUMBAR WOUND EXPLORATION;  Surgeon: Lisbeth Renshaw, MD;  Location: [facility] OR;  Service: Neurosurgery;  Laterality: N/A;  posterior  . TEE WITHOUT CARDIOVERSION N/A 07/04/2019   Procedure:  TRANSESOPHAGEAL ECHOCARDIOGRAM (TEE);  Surgeon: Elease Hashimoto Deloris Ping, MD;  Location: [facility] ENDOSCOPY;  Service: Cardiovascular;  Laterality: N/A;  . TEE WITHOUT CARDIOVERSION N/A 07/25/2019   Procedure: TRANSESOPHAGEAL ECHOCARDIOGRAM (TEE);  Surgeon: Wendall Stade, MD;  Location: [facility] ENDOSCOPY;  Service: Cardiovascular;  Laterality: N/A;    Current Medications: Current Outpatient Medications on File Prior to Visit  Medication Sig  . acetaminophen (TYLENOL) 325 MG tablet Take 650 mg by mouth every 6 (six) hours as needed for mild pain, moderate pain or headache.   Marland Kitchen apixaban (ELIQUIS) 5 MG TABS tablet Take 1 tablet (5 mg total) by mouth 2 (two) times daily.  . cephALEXin (KEFLEX) 500 MG capsule Take 1 capsule (500 mg total) by mouth 4 (four) times daily.  . fluticasone (FLONASE) 50 MCG/ACT nasal spray Place 1 spray into both nostrils daily as needed for allergies or rhinitis.  . Iron, Ferrous Gluconate, 256 (28 Fe) MG TABS Take one daily  . loratadine (CLARITIN) 10 MG tablet Take 10 mg by mouth daily as needed for allergies.  . metoprolol succinate (TOPROL-XL) 25 MG 24 hr tablet Take 0.5 tablets (12.5 mg total) by mouth daily.  Marland Kitchen PARoxetine (PAXIL) 30 MG tablet Take 30 mg by mouth every evening.  . polyethylene glycol powder (GLYCOLAX/MIRALAX) 17 GM/SCOOP powder Take 17 g by mouth daily as needed for mild constipation.   . Probiotic Product (PROBIOTIC DAILY) CAPS Take 1 capsule by mouth daily.  . psyllium (METAMUCIL) 58.6 % packet Take 1 packet by mouth See admin instructions.  Mix and drink 1 packet by mouth every other night  . QUEtiapine (SEROQUEL) 50 MG tablet Take 1 tablet (50 mg total) by mouth at bedtime.   No current facility-administered medications on file prior to visit.     Allergies:   Patient has no known allergies.   Social History   Tobacco Use  . Smoking status: Former Smoker    Quit date: 1972    Years since quitting: 49.6  . Smokeless tobacco: Current User    Types: Snuff   Vaping Use  . Vaping Use: Never used  Substance Use Topics  . Alcohol use: Not Currently  . Drug use: Never    Family History: family history includes Emphysema in his father. There is no history of CAD or Heart failure.  ROS:   Please see the history of present illness.  Additional pertinent ROS otherwise unremarkable.   EKGs/Labs/Other Studies Reviewed:    The following studies were reviewed today: Echo 07/23/19 . Left ventricular ejection fraction, by estimation, is 55 to 60%. The  left ventricle has normal function. The left ventricle has no regional  wall motion abnormalities. Left ventricular diastolic parameters are  indeterminate.  2. Right ventricular systolic function is normal. The right ventricular  size is normal.  3. Left atrial size was mildly dilated.  4. The mitral valve is normal in structure. Mild mitral valve  regurgitation. No evidence of mitral stenosis.  5. The aortic valve has an indeterminant number of cusps. Aortic valve  regurgitation is not visualized. Mild to moderate aortic valve  sclerosis/calcification is present, without any evidence of aortic  stenosis.  6. The inferior vena cava is normal in size with greater than 50%  respiratory variability, suggesting right atrial pressure of 3 mmHg.   EKG:  EKG is personally reviewed.  The ekg ordered 08/01/19 demonstrates sinus bradycardia with sinus arrhythmia  Recent Labs: 07/04/2019: TSH 1.052 07/22/2019: ALT <5; B Natriuretic Peptide 559.2 07/25/2019: Magnesium 1.7 09/01/2019: BUN 27; Creat 1.14; Hemoglobin 10.2; Platelets 226; Potassium 4.7; Sodium 139  Recent Lipid Panel No results found for: CHOL, TRIG, HDL, CHOLHDL, VLDL, LDLCALC, LDLDIRECT  Physical Exam:    VS:  BP 138/70   Pulse (!) 48   Temp (!) 97.3 F (36.3 C)   Ht 5\' 10"  (1.778 m)   Wt 148 lb (67.1 kg)   SpO2 98%   BMI 21.24 kg/m     Wt Readings from Last 3 Encounters:  09/22/19 148 lb (67.1 kg)  09/18/19 147 lb (66.7  kg)  09/01/19 147 lb (66.7 kg)    GEN: Well nourished, well developed in no acute distress HEENT: Normal, moist mucous membranes NECK: No JVD CARDIAC: regular rhythm, normal S1 and S2, no rubs or gallops. No murmur. VASCULAR: Radial and DP pulses 2+ bilaterally. No carotid bruits RESPIRATORY:  Clear to auscultation without rales, wheezing or rhonchi  ABDOMEN: Soft, non-tender, non-distended MUSCULOSKELETAL:  Ambulates independently SKIN: Warm and dry, no edema NEUROLOGIC:  Alert and oriented x 3. No focal neuro deficits noted. PSYCHIATRIC:  Normal affect   ASSESSMENT:    1. Atrial flutter, unspecified type (HCC)   2. Essential hypertension   3. PFO (patent foramen ovale)   4. Cardiomyopathy, unspecified type (HCC)   5. Cardiac risk counseling   6. Counseling on health promotion and disease prevention    PLAN:    Atrial flutter, paroxysmal: CHA2DS2/VAS Stroke Risk Points= 5  -sinus rhythm by exam today, range is sinus bradycardia. He feels well. He  is already on the lowest dose of metoprolol succinate at 12.5 mg daily. -continue apixaban  Cardiomyopathy: function normalized on most recent echo -had been on metoprolol succinate and lisinopril. No longer on lisinopril and cannot uptitrate metoprolol given bradycardia  Hypertension: -as above, now only on metoprolol succinate. Monitor  PFO: found incidentally on TEE, no history of stroke  Cardiac risk counseling and prevention recommendations: -recommend heart healthy/Mediterranean diet, with whole grains, fruits, vegetable, fish, lean meats, nuts, and olive oil. Limit salt. -recommend moderate walking, 3-5 times/week for 30-50 minutes each session. Aim for at least 150 minutes.week. Goal should be pace of 3 miles/hours, or walking 1.5 miles in 30 minutes -recommend avoidance of tobacco products. Avoid excess alcohol. -ASCVD risk score: The 10-year ASCVD risk score Denman George DC Montez Hageman., et al., 2013) is: 55.8%   Values used to  calculate the score:     Age: 46 years     Sex: Male     Is Non-Hispanic African American: No     Diabetic: Yes     Tobacco smoker: No     Systolic Blood Pressure: 138 mmHg     Is BP treated: Yes     HDL Cholesterol: 52 mg/dL     Total Cholesterol: 139 mg/dL   -over age recommendation for general recommendation for statin  Plan for follow up: 6 mos or sooner as needed  Jodelle Red, MD, PhD [place]  [facility]    Medication Adjustments/Labs and Tests Ordered: Current medicines are reviewed at length with the patient today.  Concerns regarding medicines are outlined above.  No orders of the defined types were placed in this encounter.  No orders of the defined types were placed in this encounter.   Patient Instructions  Medication Instructions:  Your Physician recommend you continue on your current medication as directed.    *If you need a refill on your cardiac medications before your next appointment, please call your pharmacy*   Lab Work: None  Testing/Procedures: None   Follow-Up: At [facility], you and your health needs are our priority.  As part of our continuing mission to provide you with exceptional heart care, we have created designated Provider Care Teams.  These Care Teams include your primary Cardiologist (physician) and Advanced Practice Providers (APPs -  Physician Assistants and Nurse Practitioners) who all work together to provide you with the care you need, when you need it.  We recommend signing up for the patient portal called "MyChart".  Sign up information is provided on this After Visit Summary.  MyChart is used to connect with patients for Virtual Visits (Telemedicine).  Patients are able to view lab/test results, encounter notes, upcoming appointments, etc.  Non-urgent messages can be sent to your provider as well.   To learn more about what you can do with MyChart, go to [email or web address].    Your next appointment:   6  month(s)  The format for your next appointment:   In Person  Provider:   Jodelle Red, MD     Signed, Jodelle Red, MD PhD 09/22/2019  [facility]

## 2019-09-25 ENCOUNTER — Ambulatory Visit: Payer: Medicare Other | Admitting: Gastroenterology

## 2019-09-27 ENCOUNTER — Other Ambulatory Visit: Payer: Self-pay | Admitting: Family Medicine

## 2019-09-27 DIAGNOSIS — F321 Major depressive disorder, single episode, moderate: Secondary | ICD-10-CM

## 2019-09-27 DIAGNOSIS — G4701 Insomnia due to medical condition: Secondary | ICD-10-CM

## 2019-10-07 ENCOUNTER — Ambulatory Visit: Payer: Medicare Other | Admitting: Internal Medicine

## 2019-10-16 ENCOUNTER — Telehealth: Payer: Self-pay | Admitting: Cardiology

## 2019-10-16 NOTE — Telephone Encounter (Signed)
Patient calling for heart monitor results, he states it has been over a month.

## 2019-10-20 NOTE — Telephone Encounter (Signed)
The patient has been notified of the result and verbalized understanding.  All questions (if any) were answered. Theresia Majors, RN 10/20/2019 3:04 PM

## 2019-10-20 NOTE — Telephone Encounter (Signed)
Patient calling for clarification on results for monitor results.

## 2019-10-20 NOTE — Telephone Encounter (Signed)
Hi Carlyle,  His monitor was read today, and I just put in a result note for him.   Thanks so much! Logen Heintzelman

## 2019-10-30 ENCOUNTER — Other Ambulatory Visit: Payer: Self-pay | Admitting: Cardiology

## 2019-10-30 NOTE — Telephone Encounter (Signed)
Please review for refill, Thanks !  

## 2019-11-16 ENCOUNTER — Encounter: Payer: Self-pay | Admitting: Cardiology

## 2019-11-16 DIAGNOSIS — I10 Essential (primary) hypertension: Secondary | ICD-10-CM | POA: Insufficient documentation

## 2019-11-16 DIAGNOSIS — Q2112 Patent foramen ovale: Secondary | ICD-10-CM | POA: Insufficient documentation

## 2019-11-16 DIAGNOSIS — Q211 Atrial septal defect: Secondary | ICD-10-CM | POA: Insufficient documentation

## 2019-11-16 DIAGNOSIS — I429 Cardiomyopathy, unspecified: Secondary | ICD-10-CM | POA: Insufficient documentation

## 2019-11-17 ENCOUNTER — Ambulatory Visit: Payer: BLUE CROSS/BLUE SHIELD | Admitting: Internal Medicine

## 2019-12-30 ENCOUNTER — Encounter: Payer: Self-pay | Admitting: Internal Medicine

## 2019-12-30 ENCOUNTER — Other Ambulatory Visit: Payer: Self-pay

## 2019-12-30 ENCOUNTER — Ambulatory Visit (INDEPENDENT_AMBULATORY_CARE_PROVIDER_SITE_OTHER): Payer: Medicare Other | Admitting: Internal Medicine

## 2019-12-30 VITALS — BP 153/75 | HR 46 | Wt 148.2 lb

## 2019-12-30 DIAGNOSIS — G062 Extradural and subdural abscess, unspecified: Secondary | ICD-10-CM

## 2019-12-30 DIAGNOSIS — A4901 Methicillin susceptible Staphylococcus aureus infection, unspecified site: Secondary | ICD-10-CM

## 2019-12-30 NOTE — Progress Notes (Signed)
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   MRN: [id number]  HPI Neil Cordova is 79yo M who had discitis/epidural MSSA abscess following microdiscectomy where he completed 6 wks of IV abtx in early July, then subsequently converted to chronic oral suppression. He has been taking cephalexin 500mg  QID since we last saw him. Overall doing well. Back to baseline weight. Working back full time. Runs 30-81miles per week occ pain across low back but otherwise appears back to his baseline health.  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   Outpatient Encounter Medications as of 12/30/2019  Medication Sig  . cephALEXin (KEFLEX) 500 MG capsule Take 1 capsule (500 mg total) by mouth 4 (four) times daily.  01/01/2020 ELIQUIS 5 MG TABS tablet TAKE 1 TABLET(5 MG) BY MOUTH TWICE DAILY  . loratadine (CLARITIN) 10 MG tablet Take 10 mg by mouth daily as needed for allergies.  Marland Kitchen PARoxetine (PAXIL) 30 MG tablet Take 30 mg by mouth every evening.  . psyllium (METAMUCIL) 58.6 % packet Take 1 packet by mouth See admin instructions. Mix and drink 1 packet by mouth every other night  . acetaminophen (TYLENOL) 325 MG tablet Take 650 mg by mouth every 6 (six) hours as needed for mild pain, moderate pain or headache.   . fluticasone (FLONASE) 50 MCG/ACT nasal spray Place 1 spray into both nostrils daily as needed for allergies or rhinitis.  . Iron, Ferrous Gluconate, 256 (28 Fe) MG TABS Take one daily  . metoprolol succinate (TOPROL-XL) 25 MG 24 hr tablet Take 0.5 tablets (12.5 mg total) by mouth daily.  . polyethylene glycol powder (GLYCOLAX/MIRALAX) 17 GM/SCOOP powder Take 17 g by mouth daily as needed for mild constipation.   . Probiotic Product (PROBIOTIC DAILY) CAPS Take 1 capsule by mouth daily. (Patient not taking: Reported on 12/30/2019)  . QUEtiapine (SEROQUEL) 50 MG tablet TAKE 1 TABLET(50 MG) BY MOUTH AT BEDTIME (Patient  not taking: Reported on 12/30/2019)   No facility-administered encounter medications on file as of 12/30/2019.     Patient Active Problem List   Diagnosis Date Noted  . Essential hypertension 11/16/2019  . PFO (patent foramen ovale) 11/16/2019  . Cardiomyopathy (HCC) 11/16/2019  . Iron deficiency 08/14/2019  . Anemia 07/31/2019  . Insomnia due to medical condition 07/31/2019  . Depression, major, single episode, moderate (HCC) 07/31/2019  . Prediabetes 07/31/2019  . Iron deficiency anemia due to chronic blood loss 07/28/2019  . Acute systolic CHF (congestive heart failure) (HCC) 07/22/2019  . Abscess   . SIRS (systemic inflammatory response syndrome) (HCC)   . Epidural abscess   . Acute combined systolic and diastolic congestive heart failure (HCC)   . Bacteremia   . Acute metabolic encephalopathy 07/03/2019  . Delirium 07/03/2019  . Unspecified atrial fibrillation (HCC) 07/03/2019  . Atrial flutter (HCC) 07/03/2019  . Pressure injury of skin 07/01/2019  . Wound infection after surgery 06/30/2019  . Hyponatremia 06/30/2019     Health Maintenance Due  Topic Date Due  . Hepatitis C Screening  Never done  . URINE MICROALBUMIN  Never done  . TETANUS/TDAP  Never done  . INFLUENZA VACCINE  09/07/2019     Review of Systems Other than what is mentioned above -- Constitutional: Negative for fever, chills, diaphoresis, activity change, appetite change, fatigue and unexpected weight change.  HENT: Negative for congestion, sore throat, rhinorrhea, sneezing, trouble swallowing and sinus pressure.  Eyes: Negative  for photophobia and visual disturbance.  Respiratory: Negative for cough, chest tightness, shortness of breath, wheezing and stridor.  Cardiovascular: Negative for chest pain, palpitations and leg swelling.  Gastrointestinal: Negative for nausea, vomiting, abdominal pain, diarrhea, constipation, blood in stool, abdominal distention and anal bleeding.  Genitourinary:  Negative for dysuria, hematuria, flank pain and difficulty urinating.  Musculoskeletal: Negative for myalgias, back pain, joint swelling, arthralgias and gait problem.  Skin: Negative for color change, pallor, rash and wound.  Neurological: Negative for dizziness, tremors, weakness and light-headedness.  Hematological: Negative for adenopathy. Does not bruise/bleed easily.  Psychiatric/Behavioral: Negative for behavioral problems, confusion, sleep disturbance, dysphoric mood, decreased concentration and agitation.    Physical Exam   BP (!) 153/75   Pulse (!) 46   Wt 148 lb 3.2 oz (67.2 kg)   BMI 21.26 kg/m   gen = a  X o by 3 in NAD HEENT = EOMI, PERRLA, no OP lesions Back = Residual hyperpigment scar on lower spine region. Non tender, no fluctuance CBC Lab Results  Component Value Date   WBC 5.4 09/01/2019   RBC 3.43 (L) 09/01/2019   HGB 10.2 (L) 09/01/2019   HCT 31.0 (L) 09/01/2019   PLT 226 09/01/2019   MCV 90.4 09/01/2019   MCH 29.7 09/01/2019   MCHC 32.9 09/01/2019   RDW 13.1 09/01/2019   LYMPHSABS 1,355 09/01/2019   MONOABS 0.6 07/25/2019   EOSABS 92 09/01/2019    BMET Lab Results  Component Value Date   NA 139 09/01/2019   K 4.7 09/01/2019   CL 102 09/01/2019   CO2 29 09/01/2019   GLUCOSE 119 (H) 09/01/2019   BUN 27 (H) 09/01/2019   CREATININE 1.14 09/01/2019   CALCIUM 9.5 09/01/2019   GFRNONAA 63 08/01/2019   GFRAA 73 08/01/2019   Lab Results  Component Value Date   ESRSEDRATE 11 12/30/2019   Lab Results  Component Value Date   CRP 0.9 12/30/2019   Historic imaging:lumbar spine IMPRESSION: 1. Compared to CT 08/13/2019, interval removal of the subcutaneous catheter and diminished subcutaneous collection now measuring 10 x 3 mm. 2. 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 3. Epidural phlegmon at L3 and L4 without spinal canal collection. 4. 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 5. Foraminal impingement at L2-3 to L4-5, particularly advanced on the left at L3-4 and right at L4-5.    Assessment and Plan MSSA epidural abscess/discitis following microdiscectomy =   Will check inflammatory markers. Trying to decide when end date on abtx. May need repeat MRI to see if any residual infection given heavy burden  - inflammatory markers have normalized ( in July sed rate of 36) now down to 11 (WNL)   - for now will have him finish out his current abtx ( roughly 1 week left)  - will repeat mri of lumbar spine to see if some of the residual infection/abnormalities noted on imaging from July 2021 still present- to decide if need to extend abtx  - we will see him back in 4-6 wks off of abtx to see if any changes off of abtx

## 2019-12-31 LAB — CBC WITH DIFFERENTIAL/PLATELET
Absolute Monocytes: 449 cells/uL (ref 200–950)
Basophils Absolute: 38 cells/uL (ref 0–200)
Basophils Relative: 0.9 %
Eosinophils Absolute: 181 cells/uL (ref 15–500)
Eosinophils Relative: 4.3 %
HCT: 32.8 % — ABNORMAL LOW (ref 38.5–50.0)
Hemoglobin: 10.9 g/dL — ABNORMAL LOW (ref 13.2–17.1)
Lymphs Abs: 1205 cells/uL (ref 850–3900)
MCH: 29.7 pg (ref 27.0–33.0)
MCHC: 33.2 g/dL (ref 32.0–36.0)
MCV: 89.4 fL (ref 80.0–100.0)
MPV: 11.1 fL (ref 7.5–12.5)
Monocytes Relative: 10.7 %
Neutro Abs: 2327 cells/uL (ref 1500–7800)
Neutrophils Relative %: 55.4 %
Platelets: 182 10*3/uL (ref 140–400)
RBC: 3.67 10*6/uL — ABNORMAL LOW (ref 4.20–5.80)
RDW: 14.4 % (ref 11.0–15.0)
Total Lymphocyte: 28.7 %
WBC: 4.2 10*3/uL (ref 3.8–10.8)

## 2019-12-31 LAB — BASIC METABOLIC PANEL
BUN/Creatinine Ratio: 27 (calc) — ABNORMAL HIGH (ref 6–22)
BUN: 28 mg/dL — ABNORMAL HIGH (ref 7–25)
CO2: 28 mmol/L (ref 20–32)
Calcium: 10 mg/dL (ref 8.6–10.3)
Chloride: 105 mmol/L (ref 98–110)
Creat: 1.05 mg/dL (ref 0.70–1.18)
Glucose, Bld: 126 mg/dL — ABNORMAL HIGH (ref 65–99)
Potassium: 4.6 mmol/L (ref 3.5–5.3)
Sodium: 139 mmol/L (ref 135–146)

## 2019-12-31 LAB — C-REACTIVE PROTEIN: CRP: 0.9 mg/L (ref <8.0)

## 2019-12-31 LAB — SEDIMENTATION RATE: Sed Rate: 11 mm/h (ref 0–20)

## 2020-01-21 ENCOUNTER — Ambulatory Visit
Admission: RE | Admit: 2020-01-21 | Discharge: 2020-01-21 | Disposition: A | Discharge status: Home / Self Care | Payer: Medicare Other | Source: Ambulatory Visit | Attending: Internal Medicine | Admitting: Internal Medicine

## 2020-01-21 DIAGNOSIS — G062 Extradural and subdural abscess, unspecified: Secondary | ICD-10-CM

## 2020-01-21 IMAGING — MR MR LUMBAR SPINE WO/W CM
4 of 7 series · 23 of 48 positions shown · IV contrast (multihance)
Comparison: MRI of the lumbar spine [DATE].

EXAM:
MRI LUMBAR SPINE WITHOUT AND WITH CONTRAST
TECHNIQUE: Multiplanar and multiecho pulse sequences of the lumbar spine were
obtained without and with intravenous contrast.

CONTRAST:  13mL MULTIHANCE GADOBENATE DIMEGLUMINE 529 MG/ML IV SOLN

[Series 3: T1 · sagittal · 4.0mm · 0.88mm/px · 5 of 18 slices shown (1 of 2)]
[im 1/18]
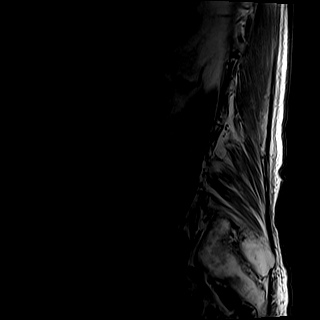
[im 5/18]
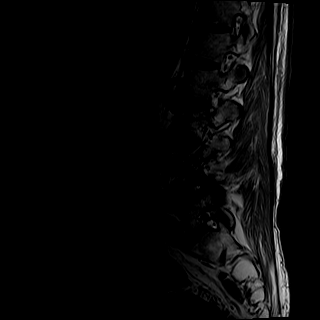
[im 9/18]
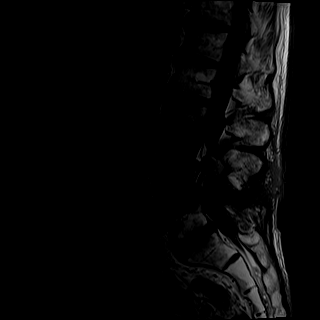
[im 13/18]
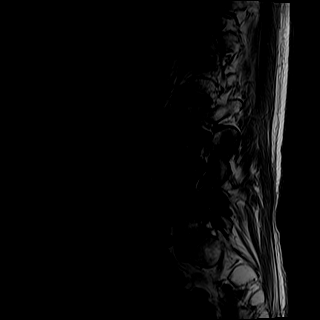
[im 18/18]
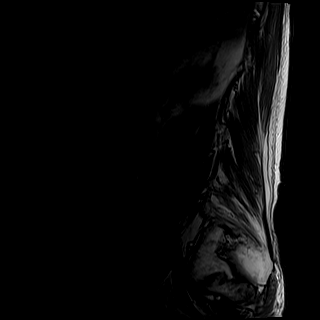

[Series 5: T2 · axial · 4.0mm · 0.39mm/px · z∈[-117,+105]mm · 8 of 43 slices shown (1 of 2)]
[im 1/43]
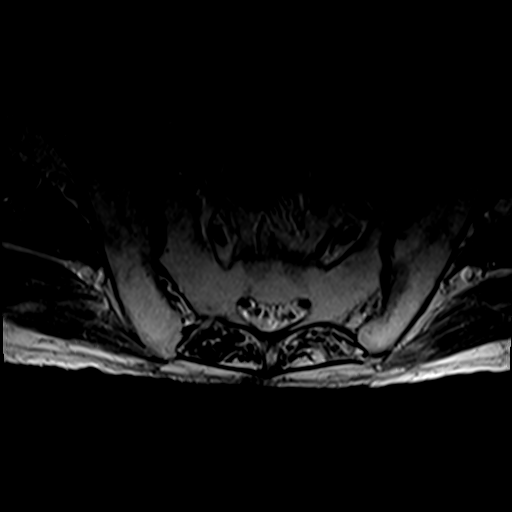
[im 5/43]
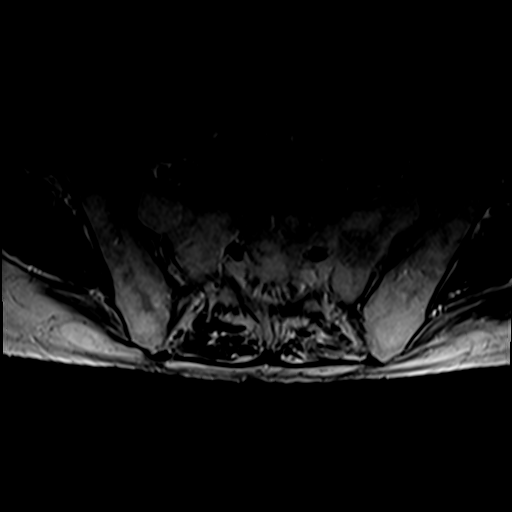
[im 15/43]
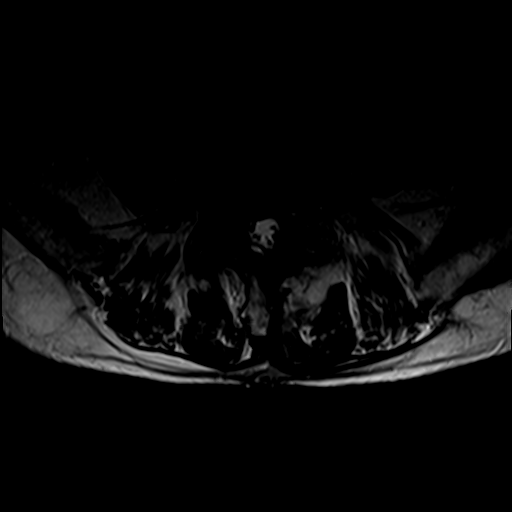
[im 19/43]
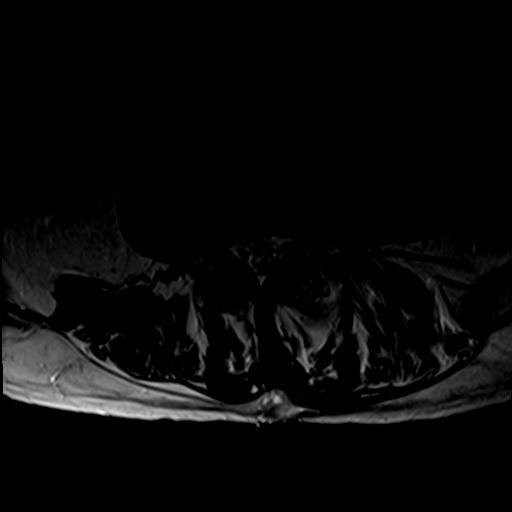
[im 24/43]
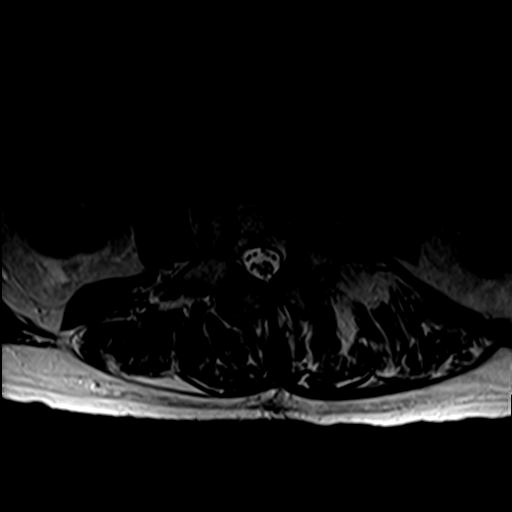
[im 29/43]
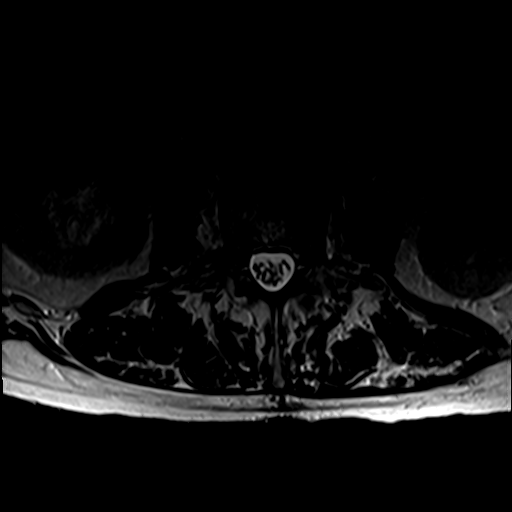
[im 38/43]
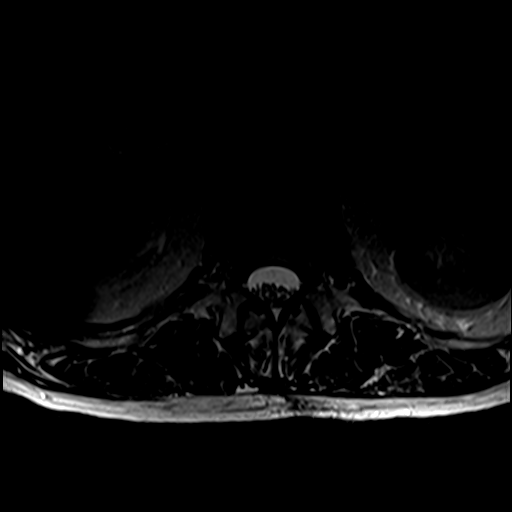
[im 43/43]
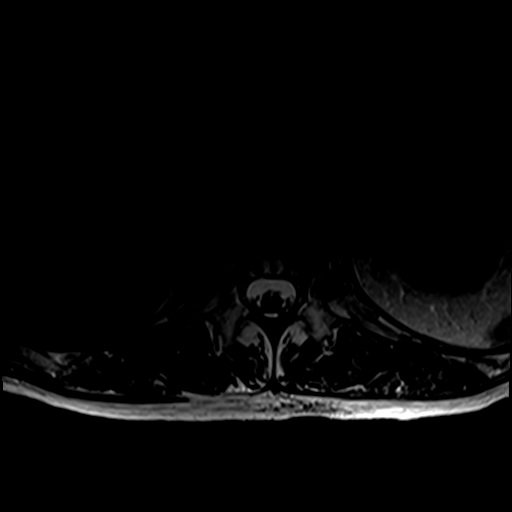

[Series 6: T1 · axial · 4.0mm · 0.39mm/px · z∈[-117,+82]mm · 6 of 43 slices shown (2 of 2)]
[im 1/43]
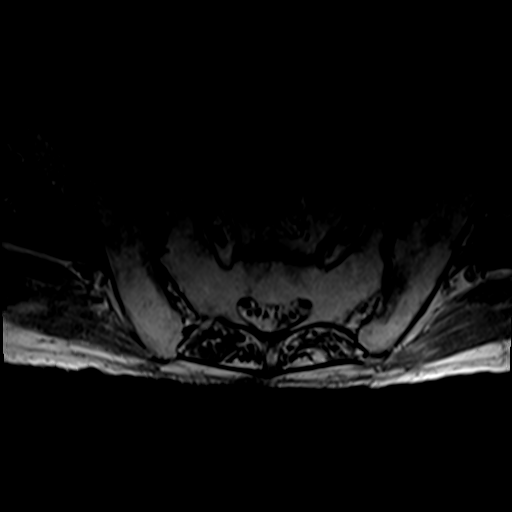
[im 5/43]
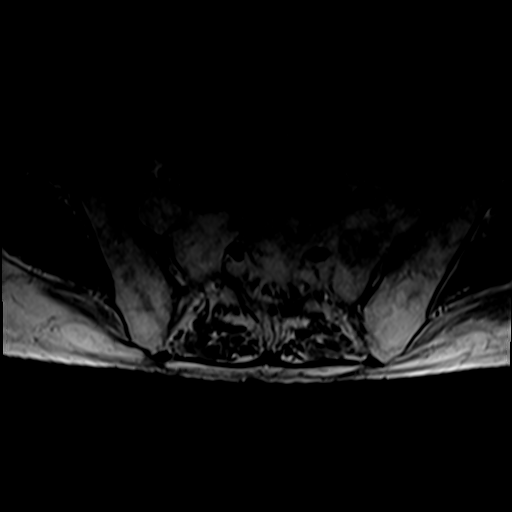
[im 15/43]
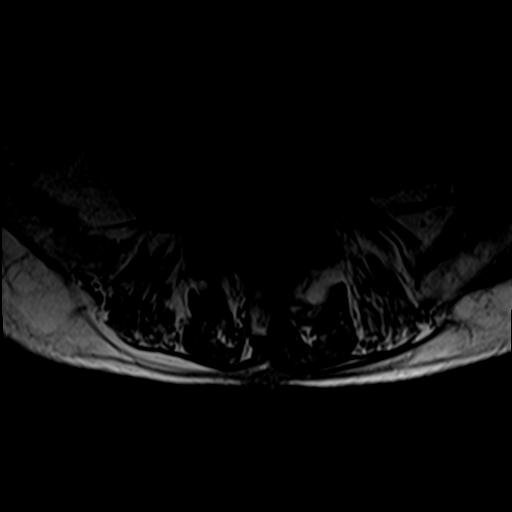
[im 19/43]
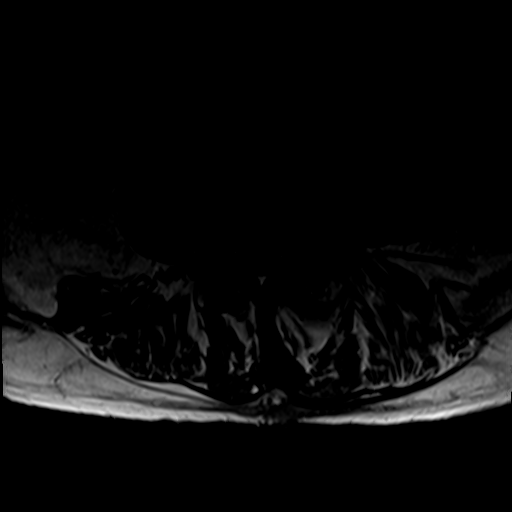
[im 24/43]
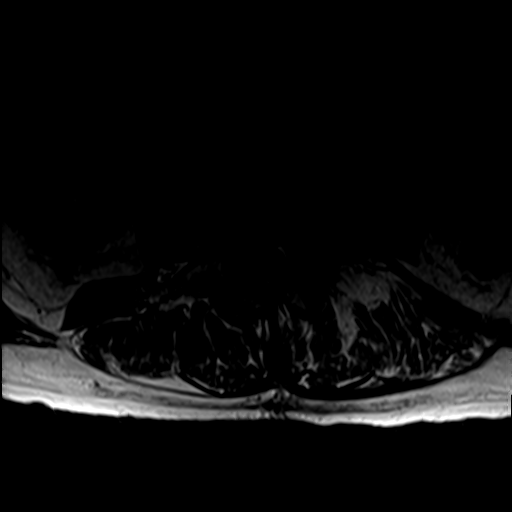
[im 38/43]
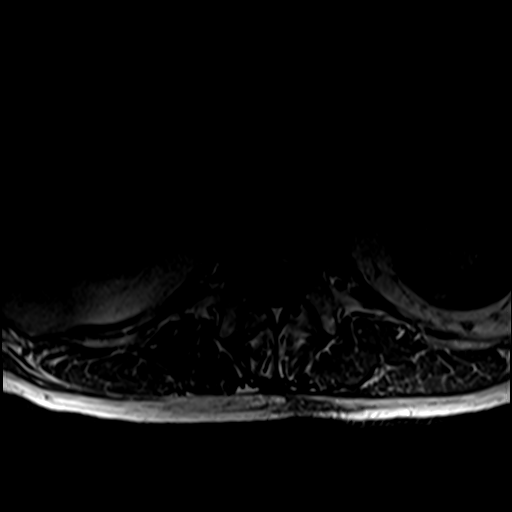

[Series 7: T2 · sagittal · 4.0mm · 1.09mm/px · 4 of 18 slices shown (2 of 2)]
[im 1/18]
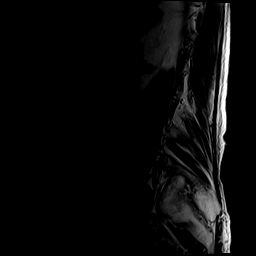
[im 6/18]
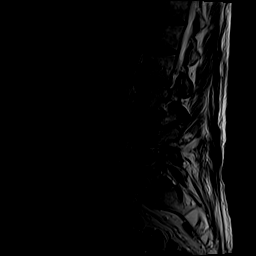
[im 12/18]
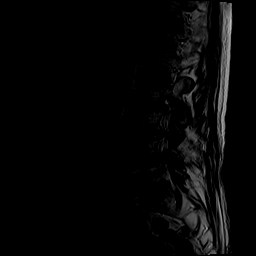
[im 18/18]
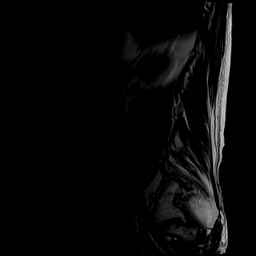

[23 of 48 positions shown; findings below may reference images not displayed]

FINDINGS: Segmentation:  Standard.

Alignment:  Dextroscoliosis of the lumbar spine.

Vertebrae: Edema and contrast enhancement of the endplates from L2
through L5 as well as posterior elements of L3 and L4 are less
evident than on prior MRI. Endplate erosion and loss of disc height
appear stable. No acute fracture or aggressive bone lesion.

Conus medullaris and cauda equina: Conus extends to the T12-L1
level. Conus and cauda equina appear normal.

Paraspinal and other soft tissues: Postsurgical changes from left
laminectomy at L4-5 with contrast enhancement in the paraspinal soft
tissues, suggestive of granulation tissue. No psoas, paraspinal
musculature or epidural abscess.

Disc levels:

T12-L1: No spinal canal or neural foraminal stenosis.

L1-2: No spinal canal or neural foraminal stenosis.

L2-3: Loss of disc height, disc bulge with associated osteophytic
component, facet degenerative changes ligamentum flavum redundancy
resulting in mild spinal stenosis, moderate right and mild left
neural foraminal narrowing.

L3-4: Loss of disc height, disc bulge, facet degenerative change
ligamentum flavum redundancy resulting in moderate spinal canal
stenosis with narrowing of the bilateral subarticular zones, mild
right and severe left neural foraminal narrowing. The degree of
spinal canal stenosis is slightly improved compared to prior.

L4-5: Status post left laminectomy. Disc bulge and prominent facet
degenerative changes resulting in narrowing of the bilateral
subarticular zones, severe right and moderate left neural foraminal
narrowing. T1 hypointense, enhancing tissue is seen surrounding the
thecal sac and and the bilateral traversing L5 nerve roots,
suggestive of postsurgical granulation tissue., similar to prior
MRI.

L5-S1: Tiny central disc protrusion and moderate facet degenerative
changes without significant spinal canal or neural foraminal
stenosis
IMPRESSION: 1. Interval improvement of changes related to prior
discitis/osteomyelitis with mild residual edema and contrast
enhancement, as described above. No evidence of epidural abscess.
2. Postsurgical changes from left laminectomy at L4-5 with peridural
and paraspinal contrast enhancement, suggestive of postsurgical
granulation tissue.
3. Degenerative changes of the lumbar spine as described above,
worst at L3-4 where there is moderate spinal canal stenosis,
slightly improved compared to prior MRI.
4. Severe left neural foraminal narrowing at L3-4 and severe right
and moderate left neural foraminal narrowing at L4-5.
5. Mild spinal canal stenosis at L2-3.

## 2020-01-21 MED ORDER — GADOBENATE DIMEGLUMINE 529 MG/ML IV SOLN
13.0000 mL | Freq: Once | INTRAVENOUS | Status: AC | PRN
  Administered 2020-01-21: 13 mL via INTRAVENOUS

## 2020-02-02 ENCOUNTER — Other Ambulatory Visit: Payer: Self-pay | Admitting: Cardiology

## 2020-02-17 ENCOUNTER — Ambulatory Visit: Payer: Medicare Other | Admitting: Internal Medicine

## 2020-03-11 ENCOUNTER — Encounter: Payer: Self-pay | Admitting: Internal Medicine

## 2020-03-11 ENCOUNTER — Ambulatory Visit (INDEPENDENT_AMBULATORY_CARE_PROVIDER_SITE_OTHER): Payer: Medicare Other | Admitting: Internal Medicine

## 2020-03-11 ENCOUNTER — Other Ambulatory Visit: Payer: Self-pay

## 2020-03-11 VITALS — BP 143/77 | HR 53 | Temp 98.0°F | Wt 148.0 lb

## 2020-03-11 DIAGNOSIS — A4901 Methicillin susceptible Staphylococcus aureus infection, unspecified site: Secondary | ICD-10-CM

## 2020-03-11 DIAGNOSIS — G062 Extradural and subdural abscess, unspecified: Secondary | ICD-10-CM

## 2020-03-11 NOTE — Progress Notes (Signed)
RFV: follow up for epidural/staph aureus SSI  Patient ID: Neil Cordova, male   DOB: 1940/05/28, 80 y.o.   MRN: [id number]  HPI He reports he is doing well. Has completed oral abtx in early December. He is  Back to 85% physical activity Tingly sensation with excess activity, mostly on the right side "feels different" but no overt pain  Takes BP runs 118/50s. He states he has white coat syndrome often at doctor's office  ROS: No f/chills/nightsweats. 12 point ros is negative    Outpatient Encounter Medications as of 03/11/2020  Medication Sig  . acetaminophen (TYLENOL) 325 MG tablet Take 650 mg by mouth every 6 (six) hours as needed for mild pain, moderate pain or headache.   . cephALEXin (KEFLEX) 500 MG capsule Take 1 capsule (500 mg total) by mouth 4 (four) times daily.  Marland Kitchen ELIQUIS 5 MG TABS tablet TAKE 1 TABLET(5 MG) BY MOUTH TWICE DAILY  . fluticasone (FLONASE) 50 MCG/ACT nasal spray Place 1 spray into both nostrils daily as needed for allergies or rhinitis.  . Iron, Ferrous Gluconate, 256 (28 Fe) MG TABS Take one daily  . loratadine (CLARITIN) 10 MG tablet Take 10 mg by mouth daily as needed for allergies.  . metoprolol succinate (TOPROL-XL) 25 MG 24 hr tablet Take 0.5 tablets (12.5 mg total) by mouth daily.  Marland Kitchen PARoxetine (PAXIL) 30 MG tablet Take 30 mg by mouth every evening.  . polyethylene glycol powder (GLYCOLAX/MIRALAX) 17 GM/SCOOP powder Take 17 g by mouth daily as needed for mild constipation.   . Probiotic Product (PROBIOTIC DAILY) CAPS Take 1 capsule by mouth daily. (Patient not taking: Reported on 12/30/2019)  . psyllium (METAMUCIL) 58.6 % packet Take 1 packet by mouth See admin instructions. Mix and drink 1 packet by mouth every other night  . QUEtiapine (SEROQUEL) 50 MG tablet TAKE 1 TABLET(50 MG) BY MOUTH AT BEDTIME (Patient not taking: Reported on 12/30/2019)   No facility-administered encounter medications on file as of 03/11/2020.     Patient Active Problem List    Diagnosis Date Noted  . Essential hypertension 11/16/2019  . PFO (patent foramen ovale) 11/16/2019  . Cardiomyopathy (HCC) 11/16/2019  . Iron deficiency 08/14/2019  . Anemia 07/31/2019  . Insomnia due to medical condition 07/31/2019  . Depression, major, single episode, moderate (HCC) 07/31/2019  . Prediabetes 07/31/2019  . Iron deficiency anemia due to chronic blood loss 07/28/2019  . Acute systolic CHF (congestive heart failure) (HCC) 07/22/2019  . Abscess   . SIRS (systemic inflammatory response syndrome) (HCC)   . Epidural abscess   . Acute combined systolic and diastolic congestive heart failure (HCC)   . Bacteremia   . Acute metabolic encephalopathy 07/03/2019  . Delirium 07/03/2019  . Unspecified atrial fibrillation (HCC) 07/03/2019  . Atrial flutter (HCC) 07/03/2019  . Pressure injury of skin 07/01/2019  . Wound infection after surgery 06/30/2019  . Hyponatremia 06/30/2019     Health Maintenance Due  Topic Date Due  . Hepatitis C Screening  Never done  . URINE MICROALBUMIN  Never done  . TETANUS/TDAP  Never done  . INFLUENZA VACCINE  09/07/2019  . COVID-19 Vaccine (3 - Booster for Pfizer series) 09/07/2019    Physical Exam   There were no vitals taken for this visit.   No exam CBC Lab Results  Component Value Date   WBC 4.2 12/30/2019   RBC 3.67 (L) 12/30/2019   HGB 10.9 (L) 12/30/2019   HCT 32.8 (L) 12/30/2019   PLT 182 12/30/2019  MCV 89.4 12/30/2019   MCH 29.7 12/30/2019   MCHC 33.2 12/30/2019   RDW 14.4 12/30/2019   LYMPHSABS 1,205 12/30/2019   MONOABS 0.6 07/25/2019   EOSABS 181 12/30/2019    BMET Lab Results  Component Value Date   NA 139 12/30/2019   K 4.6 12/30/2019   CL 105 12/30/2019   CO2 28 12/30/2019   GLUCOSE 126 (H) 12/30/2019   BUN 28 (H) 12/30/2019   CREATININE 1.05 12/30/2019   CALCIUM 10.0 12/30/2019   GFRNONAA 63 08/01/2019   GFRAA 73 08/01/2019    IMPRESSION: 1. Interval improvement of changes related to  prior discitis/osteomyelitis with mild residual edema and contrast enhancement, as described above. No evidence of epidural abscess. 2. Postsurgical changes from left laminectomy at L4-5 with peridural and paraspinal contrast enhancement, suggestive of postsurgical granulation tissue. 3. Degenerative changes of the lumbar spine as described above, worst at L3-4 where there is moderate spinal canal stenosis, slightly improved compared to prior MRI. 4. Severe left neural foraminal narrowing at L3-4 and severe right and moderate left neural foraminal narrowing at L4-5. 5. Mild spinal canal stenosis at L2-3.  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 Has completed abtx course in December, had follow MRI imaging that showed resolution of infection, but some residual changes from surgery. No symptoms to suggest ongoing infection. He has been off of abtx for 2 months. Today we Will sed rate, and crp.  Addendum: labs are WNL after being off of abtx for 2 months. No need to restart abtx. Has resolution of his staphylococcus aureus infection Lab Results  Component Value Date   ESRSEDRATE 6 03/11/2020   Lab Results  Component Value Date   CRP 0.7 03/11/2020

## 2020-03-12 LAB — C-REACTIVE PROTEIN: CRP: 0.7 mg/L (ref <8.0)

## 2020-03-12 LAB — SEDIMENTATION RATE: Sed Rate: 6 mm/h (ref 0–20)

## 2020-03-16 ENCOUNTER — Ambulatory Visit: Payer: Medicare Other | Admitting: Internal Medicine

## 2020-03-17 ENCOUNTER — Ambulatory Visit: Payer: Medicare Other | Admitting: Cardiology

## 2020-03-30 ENCOUNTER — Telehealth (INDEPENDENT_AMBULATORY_CARE_PROVIDER_SITE_OTHER): Payer: Medicare Other | Admitting: Cardiology

## 2020-03-30 VITALS — BP 116/56 | HR 50 | Ht 70.0 in | Wt 146.4 lb

## 2020-03-30 DIAGNOSIS — I4892 Unspecified atrial flutter: Secondary | ICD-10-CM

## 2020-03-30 DIAGNOSIS — I10 Essential (primary) hypertension: Secondary | ICD-10-CM | POA: Diagnosis not present

## 2020-03-30 DIAGNOSIS — I429 Cardiomyopathy, unspecified: Secondary | ICD-10-CM | POA: Diagnosis not present

## 2020-03-30 DIAGNOSIS — Q211 Atrial septal defect: Secondary | ICD-10-CM

## 2020-03-30 DIAGNOSIS — Q2112 Patent foramen ovale: Secondary | ICD-10-CM

## 2020-03-30 DIAGNOSIS — Z7189 Other specified counseling: Secondary | ICD-10-CM

## 2020-03-30 NOTE — Progress Notes (Signed)
Virtual Visit via Video Note   This visit type was conducted due to national recommendations for restrictions regarding the COVID-19 Pandemic (e.g. social distancing) in an effort to limit this patient's exposure and mitigate transmission in our community.  Due to his co-morbid illnesses, this patient is at least at moderate risk for complications without adequate follow up.  This format is felt to be most appropriate for this patient at this time.  All issues noted in this document were discussed and addressed.  A limited physical exam was performed with this format.  Please refer to the patient's chart for his consent to telehealth for [facility].  The patient was identified using 2 identifiers.  Patient Location: Home Provider Location: Home Office  Date:  03/30/2020   ID:  Neil Cordova, DOB 10/28/1940, MRN [id number]  PCP:  Patient, No Pcp Per Dr. Durwin Nora in [place], [place] Cardiologist:  Jodelle Red, MD  CC: follow up  History of Present Illness:    Neil Cordova is a 80 y.o. male with a hx of persistent atrial flutter, on apixaban, chronic combined systolic and diastolic heart failure, hypertension who is seen in follow up today.   Today: Doing well overall. Getting back to exercise since the infection/back issues, but able to get back to running on track without issues. Can go up to 11 miles/run for 3+ hours. Blood pressures have been excellent at home.  Has been having trouble sleeping, stays up late and then takes a while to fall asleep. Got about 3 hours of sleep last night. Discussed sleep hygiene. No longer taking paroxetine.  Keeps chart of HR/BP closely. Dr. Durwin Nora had him on lisinopril for a while, Dr. Doreene Burke changed him to metoprolol. He takes 1/2 tab of this daily. HR has been as low as 43, usually in the 50s. After running his heart rate will be in the 80s.  Takes apixaban religiously. No issues. Discussed risk of stroke/reason for long term anticoagulation  today.  ROS positive for whole body itching for about the last month. Not sure if it is better/worse on the days he takes claritin, he will log a pattern.  Denies chest pain, shortness of breath at rest or with normal exertion. No PND, orthopnea, LE edema or unexpected weight gain. No syncope or palpitations.  Past Medical History:  Diagnosis Date  . Anxiety   . Hypertension     Past Surgical History:  Procedure Laterality Date  . BACK SURGERY    . CARDIOVERSION N/A 07/25/2019   Procedure: CARDIOVERSION;  Surgeon: Wendall Stade, MD;  Location: [facility] ENDOSCOPY;  Service: Cardiovascular;  Laterality: N/A;  . IR RADIOLOGIST EVAL & MGMT  08/13/2019  . IR US GUIDE BX ASP/DRAIN  07/05/2019  . LUMBAR WOUND DEBRIDEMENT N/A 07/01/2019   Procedure: LUMBAR WOUND EXPLORATION;  Surgeon: Lisbeth Renshaw, MD;  Location: [facility] OR;  Service: Neurosurgery;  Laterality: N/A;  posterior  . TEE WITHOUT CARDIOVERSION N/A 07/04/2019   Procedure: TRANSESOPHAGEAL ECHOCARDIOGRAM (TEE);  Surgeon: Elease Hashimoto Deloris Ping, MD;  Location: [facility] ENDOSCOPY;  Service: Cardiovascular;  Laterality: N/A;  . TEE WITHOUT CARDIOVERSION N/A 07/25/2019   Procedure: TRANSESOPHAGEAL ECHOCARDIOGRAM (TEE);  Surgeon: Wendall Stade, MD;  Location: [facility] ENDOSCOPY;  Service: Cardiovascular;  Laterality: N/A;    Current Medications: Current Outpatient Medications on File Prior to Visit  Medication Sig  . acetaminophen (TYLENOL) 325 MG tablet Take 650 mg by mouth every 6 (six) hours as needed for mild pain, moderate pain or headache.   Marland Kitchen  ALPRAZolam (XANAX) 1 MG tablet TAKE 1 TABLET(1 MG) BY MOUTH TWICE DAILY AS NEEDED FOR SLEEP OR ANXIETY. MAX DAILY AMOUNT: 2 MG  . ELIQUIS 5 MG TABS tablet TAKE 1 TABLET(5 MG) BY MOUTH TWICE DAILY  . fluticasone (FLONASE) 50 MCG/ACT nasal spray Place 1 spray into both nostrils daily as needed for allergies or rhinitis.  Marland Kitchen loratadine (CLARITIN) 10 MG tablet Take 10 mg by mouth daily as needed for allergies.  .  metoprolol succinate (TOPROL-XL) 25 MG 24 hr tablet Take 0.5 tablets (12.5 mg total) by mouth daily.  Marland Kitchen PARoxetine (PAXIL) 30 MG tablet Take 30 mg by mouth as needed.  . psyllium (METAMUCIL) 58.6 % packet Take 1 packet by mouth See admin instructions. Mix and drink 1 packet by mouth every other night   No current facility-administered medications on file prior to visit.     Allergies:   Patient has no known allergies.   Social History   Tobacco Use  . Smoking status: Former Smoker    Quit date: 1972    Years since quitting: 50.1  . Smokeless tobacco: Current User    Types: Snuff  Vaping Use  . Vaping Use: Never used  Substance Use Topics  . Alcohol use: Not Currently  . Drug use: Never    Family History: family history includes Emphysema in his father. There is no history of CAD or Heart failure.  ROS:   Please see the history of present illness.  Additional pertinent ROS otherwise unremarkable.   EKGs/Labs/Other Studies Reviewed:    The following studies were reviewed today: Echo 07/23/19 . Left ventricular ejection fraction, by estimation, is 55 to 60%. The  left ventricle has normal function. The left ventricle has no regional  wall motion abnormalities. Left ventricular diastolic parameters are  indeterminate.  2. Right ventricular systolic function is normal. The right ventricular  size is normal.  3. Left atrial size was mildly dilated.  4. The mitral valve is normal in structure. Mild mitral valve  regurgitation. No evidence of mitral stenosis.  5. The aortic valve has an indeterminant number of cusps. Aortic valve  regurgitation is not visualized. Mild to moderate aortic valve  sclerosis/calcification is present, without any evidence of aortic  stenosis.  6. The inferior vena cava is normal in size with greater than 50%  respiratory variability, suggesting right atrial pressure of 3 mmHg.   EKG:  EKG is personally reviewed.  The ekg ordered 08/01/19  demonstrates sinus bradycardia with sinus arrhythmia  Recent Labs: 07/04/2019: TSH 1.052 07/22/2019: ALT <5; B Natriuretic Peptide 559.2 07/25/2019: Magnesium 1.7 12/30/2019: BUN 28; Creat 1.05; Hemoglobin 10.9; Platelets 182; Potassium 4.6; Sodium 139  Recent Lipid Panel No results found for: CHOL, TRIG, HDL, CHOLHDL, VLDL, LDLCALC, LDLDIRECT  Physical Exam:    VS:  BP (!) 116/56   Pulse (!) 50   Ht 5\' 10"  (1.778 m)   Wt 146 lb 6.4 oz (66.4 kg)   BMI 21.01 kg/m     Wt Readings from Last 3 Encounters:  03/30/20 146 lb 6.4 oz (66.4 kg)  03/11/20 148 lb (67.1 kg)  12/30/19 148 lb 3.2 oz (67.2 kg)    VITAL SIGNS:  reviewed GEN:  no acute distress EYES:  sclerae anicteric, EOMI - Extraocular Movements Intact RESPIRATORY:  normal respiratory effort, symmetric expansion CARDIOVASCULAR:  no visible JVD SKIN:  no rash, lesions or ulcers. MUSCULOSKELETAL:  no obvious deformities. NEURO:  alert and oriented x 3, no obvious focal deficit PSYCH:  normal affect  ASSESSMENT:    1. Atrial flutter, unspecified type (HCC)   2. Cardiomyopathy, unspecified type (HCC)   3. PFO (patent foramen ovale)   4. Essential hypertension   5. Cardiac risk counseling    PLAN:    Atrial flutter, paroxysmal: CHA2DS2/VAS Stroke Risk Points= 5  -sinus bradycardia. He feels well. He is already on the lowest dose of metoprolol succinate at 12.5 mg daily. -continue apixaban  Cardiomyopathy: function normalized on most recent echo -had been on metoprolol succinate and lisinopril. No longer on lisinopril and cannot uptitrate metoprolol given bradycardia  Hypertension: -as above, now only on metoprolol succinate. Monitor  PFO: found incidentally on TEE, no history of stroke -with flutter, on anticoagulation as above  Cardiac risk counseling and prevention recommendations: -recommend heart healthy/Mediterranean diet, with whole grains, fruits, vegetable, fish, lean meats, nuts, and olive oil. Limit  salt. -recommend moderate walking, 3-5 times/week for 30-50 minutes each session. Aim for at least 150 minutes.week. Goal should be pace of 3 miles/hours, or walking 1.5 miles in 30 minutes -recommend avoidance of tobacco products. Avoid excess alcohol. -ASCVD risk score: The 10-year ASCVD risk score Denman George(Goff [place] Montez HagemanJr., et al., 2013) is: 45%   Values used to calculate the score:     Age: [age] years     Sex: Male     Is Non-Hispanic African American: No     Diabetic: Yes     Tobacco smoker: No     Systolic Blood Pressure: 116 mmHg     Is BP treated: Yes     HDL Cholesterol: 52 mg/dL     Total Cholesterol: 139 mg/dL   -over age recommendation for general recommendation for statin  Plan for follow up: 1 year or sooner as needed  Today, I have spent 28 minutes with the patient with telehealth technology discussing the above problems.  Additional time spent in chart review, documentation, and communication.  Jodelle RedBridgette Zahi Plaskett, MD, PhD [place]  [facility]    Medication Adjustments/Labs and Tests Ordered: Current medicines are reviewed at length with the patient today.  Concerns regarding medicines are outlined above.  No orders of the defined types were placed in this encounter.  No orders of the defined types were placed in this encounter.   Patient Instructions   Medication Instructions:  Your Physician recommend you continue on your current medication as directed.    *If you need a refill on your cardiac medications before your next appointment, please call your pharmacy*   Lab Work: None  Testing/Procedures: None   Follow-Up: At [facility], you and your health needs are our priority.  As part of our continuing mission to provide you with exceptional heart care, we have created designated Provider Care Teams.  These Care Teams include your primary Cardiologist (physician) and Advanced Practice Providers (APPs -  Physician Assistants and Nurse Practitioners) who  all work together to provide you with the care you need, when you need it.  We recommend signing up for the patient portal called "MyChart".  Sign up information is provided on this After Visit Summary.  MyChart is used to connect with patients for Virtual Visits (Telemedicine).  Patients are able to view lab/test results, encounter notes, upcoming appointments, etc.  Non-urgent messages can be sent to your provider as well.   To learn more about what you can do with MyChart, go to [email or web address].    Your next appointment:   1 year(s)  The format for your next appointment:  In Person  Provider:   Jodelle Red, MD   Quality Sleep Information, Adult Quality sleep is important for your mental and physical health. It also improves your quality of life. Quality sleep means you:  Are asleep for most of the time you are in bed.  Fall asleep within 30 minutes.  Wake up no more than once a night.  Are awake for no longer than 20 minutes if you do wake up during the night. Most adults need 7-8 hours of quality sleep each night. How can poor sleep affect me? If you do not get enough quality sleep, you may have:  Mood swings.  Daytime sleepiness.  Confusion.  Decreased reaction time.  Sleep disorders, such as insomnia and sleep apnea.  Difficulty with: ? Solving problems. ? Coping with stress. ? Paying attention. These issues may affect your performance and productivity at work, school, and at home. Lack of sleep may also put you at higher risk for accidents, suicide, and risky behaviors. If you do not get quality sleep you may also be at higher risk for several health problems, including:  Infections.  Type 2 diabetes.  Heart disease.  High blood pressure.  Obesity.  Worsening of long-term conditions, like arthritis, kidney disease, depression, Parkinson's disease, and epilepsy. What actions can I take to get more quality sleep?  Stick to a sleep  schedule. Go to sleep and wake up at about the same time each day. Do not try to sleep less on weekdays and make up for lost sleep on weekends. This does not work.  Try to get about 30 minutes of exercise on most days. Do not exercise 2-3 hours before going to bed.  Limit naps during the day to 30 minutes or less.  Do not use any products that contain nicotine or tobacco, such as cigarettes or e-cigarettes. If you need help quitting, ask your health care provider.  Do not drink caffeinated beverages for at least 8 hours before going to bed. Coffee, tea, and some sodas contain caffeine.  Do not drink alcohol close to bedtime.  Do not eat large meals close to bedtime.  Do not take naps in the late afternoon.  Try to get at least 30 minutes of sunlight every day. Morning sunlight is best.  Make time to relax before bed. Reading, listening to music, or taking a hot bath promotes quality sleep.  Make your bedroom a place that promotes quality sleep. Keep your bedroom dark, quiet, and at a comfortable room temperature. Make sure your bed is comfortable. Take out sleep distractions like TV, a computer, smartphone, and bright lights.  If you are lying awake in bed for longer than 20 minutes, get up and do a relaxing activity until you feel sleepy.  Work with your health care provider to treat medical conditions that may affect sleeping, such as: ? Nasal obstruction. ? Snoring. ? Sleep apnea and other sleep disorders.  Talk to your health care provider if you think any of your prescription medicines may cause you to have difficulty falling or staying asleep.  If you have sleep problems, talk with a sleep consultant. If you think you have a sleep disorder, talk with your health care provider about getting evaluated by a specialist.      Where to find more information  National Sleep Foundation website: https://sleepfoundation.org  National Heart, Lung, and Blood Institute (NHLBI):  https://hall.info/.pdf  Centers for Disease Control and Prevention (CDC): DetailSports.is Contact a health care provider if you:  Have trouble getting to sleep or staying asleep.  Often wake up very early in the morning and cannot get back to sleep.  Have daytime sleepiness.  Have daytime sleep attacks of suddenly falling asleep and sudden muscle weakness (narcolepsy).  Have a tingling sensation in your legs with a strong urge to move your legs (restless legs syndrome).  Stop breathing briefly during sleep (sleep apnea).  Think you have a sleep disorder or are taking a medicine that is affecting your quality of sleep. Summary  Most adults need 7-8 hours of quality sleep each night.  Getting enough quality sleep is an important part of health and well-being.  Make your bedroom a place that promotes quality sleep and avoid things that may cause you to have poor sleep, such as alcohol, caffeine, smoking, and large meals.  Talk to your health care provider if you have trouble falling asleep or staying asleep. This information is not intended to replace advice given to you by your health care provider. Make sure you discuss any questions you have with your health care provider. Document Revised: 05/02/2017 Document Reviewed: 05/02/2017 Elsevier Patient Education  2021 Elsevier Inc.    Signed, Jodelle Red, MD PhD 03/30/2020  [facility]

## 2020-03-30 NOTE — Patient Instructions (Addendum)
Medication Instructions:  Your Physician recommend you continue on your current medication as directed.    *If you need a refill on your cardiac medications before your next appointment, please call your pharmacy*   Lab Work: None  Testing/Procedures: None   Follow-Up: At [facility], you and your health needs are our priority.  As part of our continuing mission to provide you with exceptional heart care, we have created designated Provider Care Teams.  These Care Teams include your primary Cardiologist (physician) and Advanced Practice Providers (APPs -  Physician Assistants and Nurse Practitioners) who all work together to provide you with the care you need, when you need it.  We recommend signing up for the patient portal called "MyChart".  Sign up information is provided on this After Visit Summary.  MyChart is used to connect with patients for Virtual Visits (Telemedicine).  Patients are able to view lab/test results, encounter notes, upcoming appointments, etc.  Non-urgent messages can be sent to your provider as well.   To learn more about what you can do with MyChart, go to [email or web address].    Your next appointment:   1 year(s)  The format for your next appointment:   In Person  Provider:   Jodelle Red, MD   Quality Sleep Information, Adult Quality sleep is important for your mental and physical health. It also improves your quality of life. Quality sleep means you:  Are asleep for most of the time you are in bed.  Fall asleep within 30 minutes.  Wake up no more than once a night.  Are awake for no longer than 20 minutes if you do wake up during the night. Most adults need 7-8 hours of quality sleep each night. How can poor sleep affect me? If you do not get enough quality sleep, you may have:  Mood swings.  Daytime sleepiness.  Confusion.  Decreased reaction time.  Sleep disorders, such as insomnia and sleep apnea.  Difficulty  with: ? Solving problems. ? Coping with stress. ? Paying attention. These issues may affect your performance and productivity at work, school, and at home. Lack of sleep may also put you at higher risk for accidents, suicide, and risky behaviors. If you do not get quality sleep you may also be at higher risk for several health problems, including:  Infections.  Type 2 diabetes.  Heart disease.  High blood pressure.  Obesity.  Worsening of long-term conditions, like arthritis, kidney disease, depression, Parkinson's disease, and epilepsy. What actions can I take to get more quality sleep?  Stick to a sleep schedule. Go to sleep and wake up at about the same time each day. Do not try to sleep less on weekdays and make up for lost sleep on weekends. This does not work.  Try to get about 30 minutes of exercise on most days. Do not exercise 2-3 hours before going to bed.  Limit naps during the day to 30 minutes or less.  Do not use any products that contain nicotine or tobacco, such as cigarettes or e-cigarettes. If you need help quitting, ask your health care provider.  Do not drink caffeinated beverages for at least 8 hours before going to bed. Coffee, tea, and some sodas contain caffeine.  Do not drink alcohol close to bedtime.  Do not eat large meals close to bedtime.  Do not take naps in the late afternoon.  Try to get at least 30 minutes of sunlight every day. Morning sunlight is best.  Make time  to relax before bed. Reading, listening to music, or taking a hot bath promotes quality sleep.  Make your bedroom a place that promotes quality sleep. Keep your bedroom dark, quiet, and at a comfortable room temperature. Make sure your bed is comfortable. Take out sleep distractions like TV, a computer, smartphone, and bright lights.  If you are lying awake in bed for longer than 20 minutes, get up and do a relaxing activity until you feel sleepy.  Work with your health care  provider to treat medical conditions that may affect sleeping, such as: ? Nasal obstruction. ? Snoring. ? Sleep apnea and other sleep disorders.  Talk to your health care provider if you think any of your prescription medicines may cause you to have difficulty falling or staying asleep.  If you have sleep problems, talk with a sleep consultant. If you think you have a sleep disorder, talk with your health care provider about getting evaluated by a specialist.      Where to find more information  National Sleep Foundation website: https://sleepfoundation.org  National Heart, Lung, and Blood Institute (NHLBI): https://hall.info/.pdf  Centers for Disease Control and Prevention (CDC): DetailSports.is Contact a health care provider if you:  Have trouble getting to sleep or staying asleep.  Often wake up very early in the morning and cannot get back to sleep.  Have daytime sleepiness.  Have daytime sleep attacks of suddenly falling asleep and sudden muscle weakness (narcolepsy).  Have a tingling sensation in your legs with a strong urge to move your legs (restless legs syndrome).  Stop breathing briefly during sleep (sleep apnea).  Think you have a sleep disorder or are taking a medicine that is affecting your quality of sleep. Summary  Most adults need 7-8 hours of quality sleep each night.  Getting enough quality sleep is an important part of health and well-being.  Make your bedroom a place that promotes quality sleep and avoid things that may cause you to have poor sleep, such as alcohol, caffeine, smoking, and large meals.  Talk to your health care provider if you have trouble falling asleep or staying asleep. This information is not intended to replace advice given to you by your health care provider. Make sure you discuss any questions you have with your health care provider. Document Revised: 05/02/2017 Document Reviewed:  05/02/2017 Elsevier Patient Education  2021 [employer].

## 2020-04-05 ENCOUNTER — Encounter: Payer: Self-pay | Admitting: Cardiology

## 2020-04-29 ENCOUNTER — Telehealth: Payer: Self-pay | Admitting: Cardiology

## 2020-04-29 NOTE — Telephone Encounter (Signed)
Pt c/o medication issue:  1. Name of Medication: ELIQUIS 5 MG TABS tablet  2. How are you currently taking this medication (dosage and times per day)? 1 tablet   3. Are you having a reaction (difficulty breathing--STAT)?   4. What is your medication issue? Patient called in to say is there other medication he can take because Eliquis is too expensive and insurance doesnt cover enough

## 2020-04-29 NOTE — Telephone Encounter (Signed)
Spoke to patient he stated he cannot afford Eliquis.Stated he wanted to ask Dr.Christopher if he needs to continue taking.If he needs to continue can he take something cheaper.Advised I will send message to Dr.Christopher for advice.

## 2020-04-30 NOTE — Telephone Encounter (Signed)
He does need to be on anticoagulation. Can we try to get him assistance? Otherwise we may need to talk about coumadin. Thanks!

## 2020-04-30 NOTE — Telephone Encounter (Signed)
Patient has a $480 deductible then $42/month after that.  Same for Xarelto.  Have patient fill out patient assistance for Eliquis - they are much more likely to approve than Xarelto.  If denied, we can't do anything to get around that deductible, he would have to go on warfarin.

## 2020-05-03 NOTE — Telephone Encounter (Signed)
Pt state he does not believe based on his income that he will qualify for patient assistance. However, pt state he has never used 30 day free card.   30 day free card and samples placed at front desk for pick up.  Eliquis 5 mg Qty: 2 boxes Lot # [id number] Exp: 4/24

## 2020-07-29 ENCOUNTER — Other Ambulatory Visit: Payer: Self-pay | Admitting: Cardiology

## 2020-07-29 NOTE — Telephone Encounter (Signed)
44m, 67.1kg, scr 1.05 12/30/19, lovw/christopher 03/30/20

## 2020-12-15 ENCOUNTER — Telehealth (HOSPITAL_BASED_OUTPATIENT_CLINIC_OR_DEPARTMENT_OTHER): Payer: Self-pay

## 2020-12-15 ENCOUNTER — Encounter (HOSPITAL_BASED_OUTPATIENT_CLINIC_OR_DEPARTMENT_OTHER): Payer: Self-pay | Admitting: Cardiology

## 2020-12-15 ENCOUNTER — Other Ambulatory Visit: Payer: Self-pay

## 2020-12-15 ENCOUNTER — Ambulatory Visit (INDEPENDENT_AMBULATORY_CARE_PROVIDER_SITE_OTHER): Payer: Medicare Other | Admitting: Cardiology

## 2020-12-15 VITALS — BP 138/64 | HR 49 | Ht 70.0 in | Wt 154.4 lb

## 2020-12-15 DIAGNOSIS — I429 Cardiomyopathy, unspecified: Secondary | ICD-10-CM

## 2020-12-15 DIAGNOSIS — Z7189 Other specified counseling: Secondary | ICD-10-CM

## 2020-12-15 DIAGNOSIS — I4892 Unspecified atrial flutter: Secondary | ICD-10-CM

## 2020-12-15 DIAGNOSIS — Q2112 Patent foramen ovale: Secondary | ICD-10-CM

## 2020-12-15 MED ORDER — APIXABAN 5 MG PO TABS: 5.0000 mg | ORAL_TABLET | Freq: Two times a day (BID) | ORAL | 0 refills | Status: DC

## 2020-12-15 NOTE — Patient Instructions (Signed)
Medication Instructions:  -Stop the peridopril and follow your blood pressure.  -For now, stay on the Eliquis, we will see if it goes generic soon.   -On average, I would like your blood pressure to be <130/80. On occasion, a blood pressure of 140-150 on the top is ok, but if it is consistently >140 on the top please let me know.  *If you need a refill on your cardiac medications before your next appointment, please call your pharmacy*   Lab Work: None ordered today   Testing/Procedures: None ordered today   Follow-Up: At [facility], you and your health needs are our priority.  As part of our continuing mission to provide you with exceptional heart care, we have created designated Provider Care Teams.  These Care Teams include your primary Cardiologist (physician) and Advanced Practice Providers (APPs -  Physician Assistants and Nurse Practitioners) who all work together to provide you with the care you need, when you need it.  We recommend signing up for the patient portal called "MyChart".  Sign up information is provided on this After Visit Summary.  MyChart is used to connect with patients for Virtual Visits (Telemedicine).  Patients are able to view lab/test results, encounter notes, upcoming appointments, etc.  Non-urgent messages can be sent to your provider as well.   To learn more about what you can do with MyChart, go to [email or web address].    Your next appointment:   3 month(s)  The format for your next appointment:   In Person  Provider:   Jodelle Red, MD    Other Instructions -how to check blood pressure:  -sit comfortably in a chair, feet uncrossed and flat on floor, for 5-10 minutes  -arm ideally should rest at the level of the heart. However, arm should be relaxed and not tense (for example, do not hold the arm up unsupported)  -avoid exercise, caffeine, and tobacco for at least 30 minutes prior to BP reading  -don't take BP cuff reading over  clothes (always place on skin directly)  -I prefer to know how well the medication is working, so I would like you to take your readings 1-2 hours after taking your blood pressure medication if possible

## 2020-12-15 NOTE — Progress Notes (Signed)
Cardiology Office Note:   Date:  12/15/2020   ID:  Neil Cordova, DOB [birth year], MRN [id number]  PCP:  Lin Givens., MD Dr. Doren Custard in [place], [place] Cardiologist:  Buford Dresser, MD  CC: follow up  History of Present Illness:    Neil Cordova is a 80 y.o. male with a hx of persistent atrial flutter, on apixaban, chronic combined systolic and diastolic heart failure, hypertension who is seen in follow up today.   Today: Lately he continues to run for exercise, and typically checks his blood pressure following his runs. Yesterday he ran 5.5 miles, and noted blood pressures such as 102/57 and 92/54. Most of his readings seem to average in the 100s-120s per his BP log. He endorses white coat syndrome, and remembers his highest blood pressure was once in the 150s.   Very occasionally, he may experience dizziness after standing.  His running is mostly limited due to his work schedule or the weather, and he has been unable to run as much as he would like. Previously he also had an injury concerning his left hamstring. He denies any limiting factors such as shortness of breath or chest pain.  In the past 1.5 years, he does not recall having any racing heart beats. However, he is able to hear his heart beat in his ears, and occasionally notices pounding palpitations after he lies down at night.  Additionally, he has insomnia treated with alprazolam.  He denies any history of HF or cardiomyopathy prior to his [facility] admission earlier this year. On extensive chart review, the first mention I can find is after his TEE in 06/2019. Reduced function is noted on his TEE, but chest wall echo around the same time was reported as normal. I personally reviewed all of his prior echocardiography, see below.  Now on perindopril, stopped metoprolol due to concern for high blood sugars per his PCP. Has had some low readings (as low as [id number] systolic), most read [id number]  systolic. Highest he has ever  seen was 150 in the doctor's office. At home have all been 0000000 with diastolic numbers in the 0000000.  No syncope but has occaisonal orthostatic symptoms.   Wants to come off of apixaban. We reviewed his complications after his back surgery in 5/2021with Dr. Kathyrn Sheriff (wound infection, flutter). Discussed risk of stroke with atrial flutter. He is not having bleeding issues, main issue is cost. After discussion, will continue apixaban.  He denies any headaches, syncope, orthopnea, PND, or lower extremity edema.   Past Medical History:  Diagnosis Date   Anxiety    Hypertension     Past Surgical History:  Procedure Laterality Date   BACK SURGERY     CARDIOVERSION N/A 07/25/2019   Procedure: CARDIOVERSION;  Surgeon: Josue Hector, MD;  Location: [facility] ENDOSCOPY;  Service: Cardiovascular;  Laterality: N/A;   IR RADIOLOGIST EVAL & MGMT  08/13/2019   IR US GUIDE BX ASP/DRAIN  07/05/2019   LUMBAR WOUND DEBRIDEMENT N/A 07/01/2019   Procedure: LUMBAR WOUND EXPLORATION;  Surgeon: Consuella Lose, MD;  Location: [place];  Service: Neurosurgery;  Laterality: N/A;  posterior   TEE WITHOUT CARDIOVERSION N/A 07/04/2019   Procedure: TRANSESOPHAGEAL ECHOCARDIOGRAM (TEE);  Surgeon: Acie Fredrickson Wonda Cheng, MD;  Location: [facility] ENDOSCOPY;  Service: Cardiovascular;  Laterality: N/A;   TEE WITHOUT CARDIOVERSION N/A 07/25/2019   Procedure: TRANSESOPHAGEAL ECHOCARDIOGRAM (TEE);  Surgeon: Josue Hector, MD;  Location: [facility] ENDOSCOPY;  Service: Cardiovascular;  Laterality: N/A;    Current Medications: Current  Outpatient Medications on File Prior to Visit  Medication Sig   acetaminophen (TYLENOL) 325 MG tablet Take 650 mg by mouth every 6 (six) hours as needed for mild pain, moderate pain or headache.    ALPRAZolam (XANAX) 1 MG tablet TAKE 1 TABLET(1 MG) BY MOUTH TWICE DAILY AS NEEDED FOR SLEEP OR ANXIETY. MAX DAILY AMOUNT: 2 MG   ELIQUIS 5 MG TABS tablet TAKE 1 TABLET(5 MG) BY MOUTH TWICE DAILY   fluticasone (FLONASE) 50  MCG/ACT nasal spray Place 1 spray into both nostrils daily as needed for allergies or rhinitis.   Glucosamine-Chondroit-Vit C-Mn (GLUCOSAMINE 1500 COMPLEX PO) Take by mouth daily.   loratadine (CLARITIN) 10 MG tablet Take 10 mg by mouth daily as needed for allergies.   PARoxetine (PAXIL) 30 MG tablet Take 30 mg by mouth as needed.   psyllium (METAMUCIL) 58.6 % packet Take 1 packet by mouth See admin instructions. Mix and drink 1 packet by mouth every other night   metoprolol succinate (TOPROL-XL) 25 MG 24 hr tablet Take 0.5 tablets (12.5 mg total) by mouth daily.   No current facility-administered medications on file prior to visit.     Allergies:   Patient has no known allergies.   Social History   Tobacco Use   Smoking status: Former   Smokeless tobacco: Current    Types: Snuff  Vaping Use   Vaping Use: Never used  Substance Use Topics   Alcohol use: Not Currently   Drug use: Never    Family History: family history includes Emphysema in his father. There is no history of CAD or Heart failure.  ROS:   Please see the history of present illness.   (+) Palpitations (+) Dizziness (+) Insomnia Additional pertinent ROS otherwise unremarkable.   EKGs/Labs/Other Studies Reviewed:    The following studies were reviewed today:  Monitor 08/2019-09/2019: 30 days of data recorded on Zio monitor. Patient had a min HR of 37 bpm, max HR of 139 bpm, and avg HR of 64 bpm. Predominant underlying rhythm was Sinus Rhythm. No SVT, atrial fibrillation, high degree block noted. Isolated atrial and ventricular ectopy was rare (<1%). There were 5 automatically triggered events and 1 manually triggered events. These were sinus rhythm with 1st degree AV block, some with a PVC. There was one episode of sinus bradycardia with second degree AV block and 3.2 second pause, occurring 09/14/19 at 11:15 AM. There was one episode of 4 beats of NSVT.  Echo TEE (Endo) 07/25/2019: 1. Anesthesia/Cardiology unable to  pass scope with propofol Patient  electively intubated and Dr Daiva Huge passed probe using glide scope and  direct visualization.   2. [place] x 1 150J patient converted to NSR from flutter at rate of 64 bpm.   3. Left ventricular ejection fraction, by estimation, is 55 to 60%. The  left ventricle has normal function. The left ventricle has no regional  wall motion abnormalities. Left ventricular diastolic function could not  be evaluated.   4. Right ventricular systolic function is normal. The right ventricular  size is normal.   5. Left atrial size was moderately dilated. No left atrial/left atrial  appendage thrombus was detected.   6. Right atrial size was moderately dilated.   7. The mitral valve is normal in structure. Mild mitral valve  regurgitation.   8. The aortic valve is tricuspid. Aortic valve regurgitation is not  visualized. No aortic stenosis is present.   9. PFO present. There is a small patent foramen ovale with predominantly  left to right shunting across the atrial septum.   Echo 07/23/19 . Left ventricular ejection fraction, by estimation, is 55 to 60%. The  left ventricle has normal function. The left ventricle has no regional  wall motion abnormalities. Left ventricular diastolic parameters are  indeterminate.   2. Right ventricular systolic function is normal. The right ventricular  size is normal.   3. Left atrial size was mildly dilated.   4. The mitral valve is normal in structure. Mild mitral valve  regurgitation. No evidence of mitral stenosis.   5. The aortic valve has an indeterminant number of cusps. Aortic valve  regurgitation is not visualized. Mild to moderate aortic valve  sclerosis/calcification is present, without any evidence of aortic  stenosis.   6. The inferior vena cava is normal in size with greater than 50%  respiratory variability, suggesting right atrial pressure of 3 mmHg.   EKG:  EKG is personally reviewed.   12/15/2020: sinus bradycardia  at 49 bpm with 1st degree AV block 08/01/19: sinus bradycardia with sinus arrhythmia  Recent Labs: 12/30/2019: BUN 28; Creat 1.05; Hemoglobin 10.9; Platelets 182; Potassium 4.6; Sodium 139   Recent Lipid Panel No results found for: CHOL, TRIG, HDL, CHOLHDL, VLDL, LDLCALC, LDLDIRECT  Physical Exam:    VS:  BP 138/64 (BP Location: Left Arm, Patient Position: Sitting, Cuff Size: Normal)   Pulse (!) 49   Ht 5\' 10"  (1.778 m)   Wt 154 lb 6.4 oz (70 kg)   SpO2 97%   BMI 22.15 kg/m     Wt Readings from Last 3 Encounters:  12/15/20 154 lb 6.4 oz (70 kg)  03/30/20 146 lb 6.4 oz (66.4 kg)  03/11/20 148 lb (67.1 kg)    GEN: Well nourished, well developed in no acute distress HEENT: Normal, moist mucous membranes NECK: No JVD CARDIAC: regular rhythm, normal S1 and S2, no rubs or gallops. No murmur. VASCULAR: Radial and DP pulses 2+ bilaterally. No carotid bruits RESPIRATORY:  Clear to auscultation without rales, wheezing or rhonchi  ABDOMEN: Soft, non-tender, non-distended MUSCULOSKELETAL:  Ambulates independently SKIN: Warm and dry, no edema NEUROLOGIC:  Alert and oriented x 3. No focal neuro deficits noted. PSYCHIATRIC:  Normal affect    ASSESSMENT:    1. Atrial flutter, unspecified type (HCC)   2. Cardiomyopathy, unspecified type (HCC)   3. PFO (patent foramen ovale)   4. Cardiac risk counseling   5. Counseling on health promotion and disease prevention     PLAN:    Atrial flutter, paroxysmal: CHA2DS2/VAS Stroke Risk Points= 5  -sinus bradycardia on the lowest dose of metoprolol succinate 12.5 mg daily. Stopped by PCP, reportedly due to elevated blood sugars -continue apixaban. We discussed risk of stroke at length. Also discussed alternatives to DOAC such as warfarin/coumadin. After shared decision making, will continue apixaban. This is very expensive for him ($600), likely as he is in the donut hole. He is asking for samples to help him, will see if we can assist him  today.  Cardiomyopathy: I extensively reviewed his echoes today. He denies any prior heart issues before presenting to [facility] in 06/2019 TTE 07/03/19: read as EF 50-55%, in flutter which makes interpretation difficult, likely ~45-50% TEE 07/04/19: read as EF 35-40%, appears more like 45-50% on my review TTE 07/13/19: read as EF 55-60%, agree it looks around 55%, in flutter TEE 07/25/19: read as EF 55-60%, agree, in flutter prior to cardioversion  Given the above, question as to whether he truly had significant cardiomyopathy.  Suspect this was exacerbated by atrial flutter.   We discussed that certain beta blockers and ACEi/ARB/ARNI are recommended in cardiomyopathy. However, given that his blood pressure and heart rate run low, he is NYHA class 1 (not limited at all, can run many miles). There is danger to overtreatment. Would scale back on medication as below and see how he does.I, Buford Dresser, MD, have reviewed all documentation for this visit. The documentation on 12/15/20 for the exam, diagnosis, procedures, and orders are all accurate and complete.   Hypertension: -running more low than high at home, has historically run higher in the office -placed on perindopril 4 mg daily by PCP. With low pressures and occasional lightheadedness, will hold this and monitor home BP  PFO: found incidentally on TEE, no history of stroke -with flutter, on anticoagulation as above  Cardiac risk counseling and prevention recommendations: -recommend heart healthy/Mediterranean diet, with whole grains, fruits, vegetable, fish, lean meats, nuts, and olive oil. Limit salt. -recommend moderate walking, 3-5 times/week for 30-50 minutes each session. Aim for at least 150 minutes.week. Goal should be pace of 3 miles/hours, or walking 1.5 miles in 30 minutes -recommend avoidance of tobacco products. Avoid excess alcohol. -ASCVD risk score: The 10-year ASCVD risk score (Arnett DK, et al., 2019) is: 58.7%   Values  used to calculate the score:     Age: 60 years     Sex: Male     Is Non-Hispanic African American: No     Diabetic: Yes     Tobacco smoker: No     Systolic Blood Pressure: 0000000 mmHg     Is BP treated: Yes     HDL Cholesterol: 52 mg/dL     Total Cholesterol: 139 mg/dL   -over age recommendation for general recommendation for statin  Plan for follow up: 3 months or sooner as needed  Buford Dresser, MD, PhD [place]  [facility]    Medication Adjustments/Labs and Tests Ordered: Current medicines are reviewed at length with the patient today.  Concerns regarding medicines are outlined above.   Orders Placed This Encounter  Procedures   EKG 12-Lead    No orders of the defined types were placed in this encounter.  Patient Instructions  Medication Instructions:  -Stop the peridopril and follow your blood pressure.  -For now, stay on the Eliquis, we will see if it goes generic soon.   -On average, I would like your blood pressure to be <130/80. On occasion, a blood pressure of 140-150 on the top is ok, but if it is consistently >140 on the top please let me know.  *If you need a refill on your cardiac medications before your next appointment, please call your pharmacy*   Lab Work: None ordered today   Testing/Procedures: None ordered today   Follow-Up: At [facility], you and your health needs are our priority.  As part of our continuing mission to provide you with exceptional heart care, we have created designated Provider Care Teams.  These Care Teams include your primary Cardiologist (physician) and Advanced Practice Providers (APPs -  Physician Assistants and Nurse Practitioners) who all work together to provide you with the care you need, when you need it.  We recommend signing up for the patient portal called "MyChart".  Sign up information is provided on this After Visit Summary.  MyChart is used to connect with patients for Virtual Visits  (Telemedicine).  Patients are able to view lab/test results, encounter notes, upcoming appointments, etc.  Non-urgent  messages can be sent to your provider as well.   To learn more about what you can do with MyChart, go to [email or web address].    Your next appointment:   3 month(s)  The format for your next appointment:   In Person  Provider:   Buford Dresser, MD    Other Instructions -how to check blood pressure:  -sit comfortably in a chair, feet uncrossed and flat on floor, for 5-10 minutes  -arm ideally should rest at the level of the heart. However, arm should be relaxed and not tense (for example, do not hold the arm up unsupported)  -avoid exercise, caffeine, and tobacco for at least 30 minutes prior to BP reading  -don't take BP cuff reading over clothes (always place on skin directly)  -I prefer to know how well the medication is working, so I would like you to take your readings 1-2 hours after taking your blood pressure medication if possible     I,Mathew Stumpf,acting as a scribe for [employer], MD.,have documented all relevant documentation on the behalf of Buford Dresser, MD,as directed by  Buford Dresser, MD while in the presence of Buford Dresser, MD.  I, Buford Dresser, MD, have reviewed all documentation for this visit. The documentation on 12/15/20 for the exam, diagnosis, procedures, and orders are all accurate and complete.   Total time of encounter: 59 minutes total time of encounter, including 40 minutes spent in face-to-face patient care. This time includes coordination of care and counseling regarding prior testing and options for managment. Remainder of non-face-to-face time involved reviewing chart documents/testing relevant to the patient encounter and documentation in the medical record.  Buford Dresser, MD, PhD, [facility] [place]  [facility]    Signed, Buford Dresser, MD PhD 12/15/2020   [place]

## 2020-12-15 NOTE — Telephone Encounter (Signed)
Sample given to patient for 14 days Eliquis 5 mg as per Dr Cristal Deer.  Pt in donut hole and states he doesn't qualify for pt assistance.

## 2020-12-27 ENCOUNTER — Other Ambulatory Visit: Payer: Self-pay | Admitting: Cardiology

## 2020-12-27 NOTE — Telephone Encounter (Signed)
Prescription refill request for Eliquis received. Indication:Afib Last office visit:11/22 Scr:1.0 Age: 80 Weight:70 kg  Prescription refilled

## 2021-01-24 ENCOUNTER — Encounter (HOSPITAL_BASED_OUTPATIENT_CLINIC_OR_DEPARTMENT_OTHER): Payer: Self-pay

## 2021-01-25 NOTE — Telephone Encounter (Signed)
Please advise if medication changes are needed!

## 2021-02-07 ENCOUNTER — Encounter (HOSPITAL_BASED_OUTPATIENT_CLINIC_OR_DEPARTMENT_OTHER): Payer: Self-pay

## 2021-02-08 NOTE — Telephone Encounter (Signed)
Please advise 

## 2021-03-17 NOTE — Progress Notes (Signed)
Cardiology Office Note:   Date:  03/18/2021   ID:  Neil Cordova, DOB 11/05/1940, MRN [id number]  PCP:  Hulan Amato, DO Dr. Doren Custard in [address], [place] Cardiologist:  Buford Dresser, MD  CC: follow up  History of Present Illness:    Neil Cordova is a 81 y.o. male with a hx of persistent atrial flutter, on apixaban, chronic combined systolic and diastolic heart failure, hypertension who is seen in follow up today.   CV history: He denies any history of HF or cardiomyopathy prior to his [facility] admission earlier 2022. On extensive chart review, the first mention I can find is after his TEE in 06/2019. Reduced function is noted on his TEE, but chest wall echo around the same time was reported as normal. I personally reviewed all of his prior echocardiography, see below.  Today: Overall, he is feeling fine. He describes himself as an anxious person. He works, travels, and he also runs at least 3-4 days a week for 5-7 miles.   He presents a BP log, and notes that he has been measuring at his wrist. After walking, his readings include 137/63, and 133/83. His readings after running include 138/81, 121/71, and 120/64. Yesterday he checked his blood pressure at his arm after running, and it was 149/59 which concerns him. He endorses white coat syndrome.  He remains compliant with Eliquis, but notes that it may soon be prohibitively expensive.  He denies any palpitations, chest pain, or shortness of breath. No lightheadedness, headaches, syncope, orthopnea, PND, lower extremity edema or exertional symptoms.   Past Medical History:  Diagnosis Date   Anxiety    Hypertension     Past Surgical History:  Procedure Laterality Date   BACK SURGERY     CARDIOVERSION N/A 07/25/2019   Procedure: CARDIOVERSION;  Surgeon: Josue Hector, MD;  Location: [facility] ENDOSCOPY;  Service: Cardiovascular;  Laterality: N/A;   IR RADIOLOGIST EVAL & MGMT  08/13/2019   IR US GUIDE BX ASP/DRAIN  07/05/2019   LUMBAR  WOUND DEBRIDEMENT N/A 07/01/2019   Procedure: LUMBAR WOUND EXPLORATION;  Surgeon: Consuella Lose, MD;  Location: [place];  Service: Neurosurgery;  Laterality: N/A;  posterior   TEE WITHOUT CARDIOVERSION N/A 07/04/2019   Procedure: TRANSESOPHAGEAL ECHOCARDIOGRAM (TEE);  Surgeon: Acie Fredrickson Wonda Cheng, MD;  Location: [facility] ENDOSCOPY;  Service: Cardiovascular;  Laterality: N/A;   TEE WITHOUT CARDIOVERSION N/A 07/25/2019   Procedure: TRANSESOPHAGEAL ECHOCARDIOGRAM (TEE);  Surgeon: Josue Hector, MD;  Location: [facility] ENDOSCOPY;  Service: Cardiovascular;  Laterality: N/A;    Current Medications: Current Outpatient Medications on File Prior to Visit  Medication Sig   acetaminophen (TYLENOL) 325 MG tablet Take 650 mg by mouth every 6 (six) hours as needed for mild pain, moderate pain or headache.    ALPRAZolam (XANAX) 1 MG tablet TAKE 1 TABLET(1 MG) BY MOUTH TWICE DAILY AS NEEDED FOR SLEEP OR ANXIETY. MAX DAILY AMOUNT: 2 MG   apixaban (ELIQUIS) 5 MG TABS tablet Take 1 tablet (5 mg total) by mouth 2 (two) times daily.   ELIQUIS 5 MG TABS tablet TAKE 1 TABLET(5 MG) BY MOUTH TWICE DAILY   fluticasone (FLONASE) 50 MCG/ACT nasal spray Place 1 spray into both nostrils daily as needed for allergies or rhinitis.   Glucosamine-Chondroit-Vit C-Mn (GLUCOSAMINE 1500 COMPLEX PO) Take by mouth daily.   loratadine (CLARITIN) 10 MG tablet Take 10 mg by mouth daily as needed for allergies.   metoprolol succinate (TOPROL-XL) 25 MG 24 hr tablet Take 0.5 tablets (12.5 mg total)  by mouth daily.   PARoxetine (PAXIL) 30 MG tablet Take 30 mg by mouth as needed.   psyllium (METAMUCIL) 58.6 % packet Take 1 packet by mouth See admin instructions. Mix and drink 1 packet by mouth every other night   sildenafil (VIAGRA) 100 MG tablet Take 100 mg by mouth daily as needed for erectile dysfunction.   No current facility-administered medications on file prior to visit.     Allergies:   Patient has no known allergies.   Social History    Tobacco Use   Smoking status: Former   Smokeless tobacco: Current    Types: Snuff  Vaping Use   Vaping Use: Never used  Substance Use Topics   Alcohol use: Not Currently   Drug use: Never    Family History: family history includes Emphysema in his father. There is no history of CAD or Heart failure.  ROS:   Please see the history of present illness.   Additional pertinent ROS otherwise unremarkable.   EKGs/Labs/Other Studies Reviewed:    The following studies were reviewed today:  Monitor 08/2019-09/2019: 30 days of data recorded on Zio monitor. Patient had a min HR of 37 bpm, max HR of 139 bpm, and avg HR of 64 bpm. Predominant underlying rhythm was Sinus Rhythm. No SVT, atrial fibrillation, high degree block noted. Isolated atrial and ventricular ectopy was rare (<1%). There were 5 automatically triggered events and 1 manually triggered events. These were sinus rhythm with 1st degree AV block, some with a PVC. There was one episode of sinus bradycardia with second degree AV block and 3.2 second pause, occurring 09/14/19 at 11:15 AM. There was one episode of 4 beats of NSVT.  Echo TEE (Endo) 07/25/2019: 1. Anesthesia/Cardiology unable to pass scope with propofol Patient  electively intubated and Dr Stephannie Peters passed probe using glide scope and  direct visualization.   2. DCC x 1 150J patient converted to NSR from flutter at rate of 64 bpm.   3. Left ventricular ejection fraction, by estimation, is 55 to 60%. The  left ventricle has normal function. The left ventricle has no regional  wall motion abnormalities. Left ventricular diastolic function could not  be evaluated.   4. Right ventricular systolic function is normal. The right ventricular  size is normal.   5. Left atrial size was moderately dilated. No left atrial/left atrial  appendage thrombus was detected.   6. Right atrial size was moderately dilated.   7. The mitral valve is normal in structure. Mild mitral valve   regurgitation.   8. The aortic valve is tricuspid. Aortic valve regurgitation is not  visualized. No aortic stenosis is present.   9. PFO present. There is a small patent foramen ovale with predominantly  left to right shunting across the atrial septum.   Echo 07/23/19 . Left ventricular ejection fraction, by estimation, is 55 to 60%. The  left ventricle has normal function. The left ventricle has no regional  wall motion abnormalities. Left ventricular diastolic parameters are  indeterminate.   2. Right ventricular systolic function is normal. The right ventricular  size is normal.   3. Left atrial size was mildly dilated.   4. The mitral valve is normal in structure. Mild mitral valve  regurgitation. No evidence of mitral stenosis.   5. The aortic valve has an indeterminant number of cusps. Aortic valve  regurgitation is not visualized. Mild to moderate aortic valve  sclerosis/calcification is present, without any evidence of aortic  stenosis.   6.  The inferior vena cava is normal in size with greater than 50%  respiratory variability, suggesting right atrial pressure of 3 mmHg.   EKG:  EKG is personally reviewed.   03/18/2021: EKG was not ordered. 12/15/2020: sinus bradycardia at 49 bpm with 1st degree AV block 08/01/19: sinus bradycardia with sinus arrhythmia  Recent Labs: No results found for requested labs within last 8760 hours.   Recent Lipid Panel No results found for: CHOL, TRIG, HDL, CHOLHDL, VLDL, LDLCALC, LDLDIRECT  Physical Exam:    VS:  BP 136/60    Pulse 61    Ht 5\' 10"  (1.778 m)    Wt 145 lb 1.6 oz (65.8 kg)    SpO2 98%    BMI 20.82 kg/m     Wt Readings from Last 3 Encounters:  03/18/21 145 lb 1.6 oz (65.8 kg)  12/15/20 154 lb 6.4 oz (70 kg)  03/30/20 146 lb 6.4 oz (66.4 kg)    GEN: Well nourished, well developed in no acute distress HEENT: Normal, moist mucous membranes NECK: No JVD CARDIAC: regular rhythm, normal S1 and S2, no rubs or gallops. No  murmur. VASCULAR: Radial and DP pulses 2+ bilaterally. No carotid bruits RESPIRATORY:  Clear to auscultation without rales, wheezing or rhonchi  ABDOMEN: Soft, non-tender, non-distended MUSCULOSKELETAL:  Ambulates independently SKIN: Warm and dry, no edema NEUROLOGIC:  Alert and oriented x 3. No focal neuro deficits noted. PSYCHIATRIC:  Normal affect    ASSESSMENT:    1. Atrial flutter, unspecified type ([place])   2. PFO (patent foramen ovale)   3. Cardiomyopathy, unspecified type ([place])   4. Cardiac risk counseling   5. Counseling on health promotion and disease prevention   6. White coat syndrome with high blood pressure but without hypertension   7. Secondary hypercoagulable state ([place])    PLAN:    Atrial flutter, paroxysmal: CHA2DS2/VAS Stroke Risk Points= 5  -sinus bradycardia on the lowest dose of metoprolol succinate 12.5 mg daily. Stopped by PCP. -continue apixaban. We discussed risk of stroke at length. Also discussed alternatives to DOAC such as warfarin/coumadin. After shared decision making, will continue apixaban. This is very expensive for him, he is interested in changing to dabigatran when generic and reliably available.  Cardiomyopathy:  TTE 07/03/19: read as EF 50-55%, in flutter which makes interpretation difficult, likely ~45-50% TEE 07/04/19: read as EF 35-40%, appears more like 45-50% on my review TTE 07/13/19: read as EF 55-60%, agree it looks around 55%, in flutter TEE 07/25/19: read as EF 55-60%, agree, in flutter prior to cardioversion  Given the above, question as to whether he truly had significant cardiomyopathy. Suspect this was exacerbated by atrial flutter.   Hypertension: -feeling well, generally well controlled based on home numbers  PFO: found incidentally on TEE, no history of stroke -with flutter, on anticoagulation as above  Cardiac risk counseling and prevention recommendations: -recommend heart healthy/Mediterranean diet, with whole grains,  fruits, vegetable, fish, lean meats, nuts, and olive oil. Limit salt. -recommend moderate walking, 3-5 times/week for 30-50 minutes each session. Aim for at least 150 minutes.week. Goal should be pace of 3 miles/hours, or walking 1.5 miles in 30 minutes -recommend avoidance of tobacco products. Avoid excess alcohol. -ASCVD risk score: The 10-year ASCVD risk score (Arnett DK, et al., 2019) is: 51.9%   Values used to calculate the score:     Age: 55 years     Sex: Male     Is Non-Hispanic African American: No     Diabetic: Yes  Tobacco smoker: No     Systolic Blood Pressure: [id number] mmHg     Is BP treated: No     HDL Cholesterol: 52 mg/dL     Total Cholesterol: 139 mg/dL   -over age recommendation for general recommendation for statin  Plan for follow up: 1 year or sooner as needed.  Buford Dresser, MD, PhD [place]   [facility]    Medication Adjustments/Labs and Tests Ordered: Current medicines are reviewed at length with the patient today.  Concerns regarding medicines are outlined above.   No orders of the defined types were placed in this encounter.  No orders of the defined types were placed in this encounter.  Patient Instructions  Medication Instructions:  Your Physician recommend you continue on your current medication as directed.    Once it is reliably generic, we can consider dabigatran (pradaxa) to replace the Eliquis.  *If you need a refill on your cardiac medications before your next appointment, please call your pharmacy*   Lab Work: None ordered today   Testing/Procedures: None ordered today   Follow-Up: At [facility], you and your health needs are our priority.  As part of our continuing mission to provide you with exceptional heart care, we have created designated Provider Care Teams.  These Care Teams include your primary Cardiologist (physician) and Advanced Practice Providers (APPs -  Physician Assistants and Nurse Practitioners) who  all work together to provide you with the care you need, when you need it.  We recommend signing up for the patient portal called "MyChart".  Sign up information is provided on this After Visit Summary.  MyChart is used to connect with patients for Virtual Visits (Telemedicine).  Patients are able to view lab/test results, encounter notes, upcoming appointments, etc.  Non-urgent messages can be sent to your provider as well.   To learn more about what you can do with MyChart, go to [email or web address].    Your next appointment:   1 year(s)  The format for your next appointment:   In Person  Provider:   Buford Dresser, MD      [facility],acting as a scribe for Buford Dresser, MD.,have documented all relevant documentation on the behalf of Buford Dresser, MD,as directed by  Buford Dresser, MD while in the presence of Buford Dresser, MD.  I, Buford Dresser, MD, have reviewed all documentation for this visit. The documentation on 03/18/21 for the exam, diagnosis, procedures, and orders are all accurate and complete.   Signed, Buford Dresser, MD PhD 03/18/2021  [place]

## 2021-03-18 ENCOUNTER — Encounter (HOSPITAL_BASED_OUTPATIENT_CLINIC_OR_DEPARTMENT_OTHER): Payer: Self-pay | Admitting: Cardiology

## 2021-03-18 ENCOUNTER — Other Ambulatory Visit: Payer: Self-pay

## 2021-03-18 ENCOUNTER — Ambulatory Visit (INDEPENDENT_AMBULATORY_CARE_PROVIDER_SITE_OTHER): Payer: Medicare Other | Admitting: Cardiology

## 2021-03-18 VITALS — BP 136/60 | HR 61 | Ht 70.0 in | Wt 145.1 lb

## 2021-03-18 DIAGNOSIS — I4892 Unspecified atrial flutter: Secondary | ICD-10-CM | POA: Diagnosis not present

## 2021-03-18 DIAGNOSIS — Q2112 Patent foramen ovale: Secondary | ICD-10-CM | POA: Diagnosis not present

## 2021-03-18 DIAGNOSIS — R030 Elevated blood-pressure reading, without diagnosis of hypertension: Secondary | ICD-10-CM | POA: Insufficient documentation

## 2021-03-18 DIAGNOSIS — D6869 Other thrombophilia: Secondary | ICD-10-CM

## 2021-03-18 DIAGNOSIS — I429 Cardiomyopathy, unspecified: Secondary | ICD-10-CM | POA: Diagnosis not present

## 2021-03-18 DIAGNOSIS — Z7189 Other specified counseling: Secondary | ICD-10-CM

## 2021-03-18 NOTE — Patient Instructions (Signed)
Medication Instructions:  Your Physician recommend you continue on your current medication as directed.    Once it is reliably generic, we can consider dabigatran (pradaxa) to replace the Eliquis.  *If you need a refill on your cardiac medications before your next appointment, please call your pharmacy*   Lab Work: None ordered today   Testing/Procedures: None ordered today   Follow-Up: At [facility], you and your health needs are our priority.  As part of our continuing mission to provide you with exceptional heart care, we have created designated Provider Care Teams.  These Care Teams include your primary Cardiologist (physician) and Advanced Practice Providers (APPs -  Physician Assistants and Nurse Practitioners) who all work together to provide you with the care you need, when you need it.  We recommend signing up for the patient portal called "MyChart".  Sign up information is provided on this After Visit Summary.  MyChart is used to connect with patients for Virtual Visits (Telemedicine).  Patients are able to view lab/test results, encounter notes, upcoming appointments, etc.  Non-urgent messages can be sent to your provider as well.   To learn more about what you can do with MyChart, go to [email or web address].    Your next appointment:   1 year(s)  The format for your next appointment:   In Person  Provider:   Jodelle Red, MD

## 2021-05-23 ENCOUNTER — Telehealth: Payer: Self-pay

## 2021-05-23 NOTE — Telephone Encounter (Signed)
Pt called in and asked to be switched to eliquis from pradaxa. They left this information on the voicemail. I will route to Dr. Johnanna Schneiders as the pt requested.  ?

## 2021-05-24 MED ORDER — DABIGATRAN ETEXILATE MESYLATE 150 MG PO CAPS: 150.0000 mg | ORAL_CAPSULE | Freq: Two times a day (BID) | ORAL | 5 refills | Status: DC

## 2021-05-24 NOTE — Telephone Encounter (Signed)
LMOM for patient to return call.  Wants to switch from Eliquis to Pradaxa ?

## 2021-05-24 NOTE — Telephone Encounter (Signed)
Patient returned call, will be out of Eliquis soon, would like to switch to generic dabigitran.   ? ?CrCl 53, dose is 150 mg bid ?

## 2022-06-25 ENCOUNTER — Other Ambulatory Visit: Payer: Self-pay | Admitting: Cardiology

## 2022-06-25 DIAGNOSIS — I4891 Unspecified atrial fibrillation: Secondary | ICD-10-CM

## 2022-06-26 NOTE — Telephone Encounter (Signed)
Please review for refill. Thank you! 

## 2022-06-26 NOTE — Telephone Encounter (Signed)
CrCl 44 ml/min. Patient overdue for appt

## 2022-06-27 NOTE — Telephone Encounter (Signed)
06/27/22 LVM to schedule overdue f/u - LCN  

## 2022-11-24 ENCOUNTER — Telehealth (HOSPITAL_BASED_OUTPATIENT_CLINIC_OR_DEPARTMENT_OTHER): Payer: Self-pay | Admitting: Cardiology

## 2022-11-24 DIAGNOSIS — I4891 Unspecified atrial fibrillation: Secondary | ICD-10-CM

## 2022-11-24 MED ORDER — DABIGATRAN ETEXILATE MESYLATE 150 MG PO CAPS
150.0000 mg | ORAL_CAPSULE | Freq: Two times a day (BID) | ORAL | 11 refills | Status: DC
Start: 2022-11-24 — End: 2023-02-22

## 2022-11-24 NOTE — Telephone Encounter (Signed)
Pt c/o medication issue:  1. Name of Medication: ELIQUIS 5 MG TABS tablet   2. How are you currently taking this medication (dosage and times per day)? 1 tablet by mouth twice daily  3. Are you having a reaction (difficulty breathing--STAT)? No   4. What is your medication issue? Is requesting assistance to lower cost of medication. Only has a week supply left. Please advise.

## 2022-11-24 NOTE — Telephone Encounter (Signed)
Returned call to patient,   Patient states his price has always been about 300-400. Asked patient if he has ever applied for patient assistance, he believes he tried before with no success.   He would like to be switched to another medication that is more cost effective.

## 2022-11-24 NOTE — Telephone Encounter (Signed)
Returned call to patient, reviewed the following options. [facility] and explanation of how to get goodrx coupon given to patient. He is very appreciative of the call      "He has Pradaxa on his med list, not Eliquis? His prescription insurance isn't great though, he has a percentage copay on branded meds and a higher deductible which explains why his cost has been high the whole year.    GoodRx coupon can be used for Pradaxa since it's generic now and brings the copay to ~$60/month if he fills [facility] a [facility].    Could also change to Xarelto, its drug company offers a program called Xarelto with Me Coverage Gap Support. Patients with high copays can get Xarelto for $89 (for 30 days) or $250 (for 90 days). They can call to enroll at: 888-XARELTO [phone] 775-5855)"

## 2022-11-24 NOTE — Telephone Encounter (Signed)
He has Pradaxa on his med list, not Eliquis? His prescription insurance isn't great though, he has a percentage copay on branded meds and a higher deductible which explains why his cost has been high the whole year.   GoodRx coupon can be used for Pradaxa since it's generic now and brings the copay to ~$60/month if he [facility].   Could also change to Xarelto, its drug company offers a program called Xarelto with Me Coverage Gap Support. Patients with high copays can get Xarelto for $89 (for 30 days) or $250 (for 90 days). They can call to enroll at: 888-XARELTO [phone])642-0546)

## 2023-01-26 ENCOUNTER — Other Ambulatory Visit (HOSPITAL_BASED_OUTPATIENT_CLINIC_OR_DEPARTMENT_OTHER): Payer: Self-pay | Admitting: Cardiology

## 2023-01-26 DIAGNOSIS — I4891 Unspecified atrial fibrillation: Secondary | ICD-10-CM

## 2023-02-20 ENCOUNTER — Ambulatory Visit (HOSPITAL_BASED_OUTPATIENT_CLINIC_OR_DEPARTMENT_OTHER): Payer: Medicare Other | Admitting: Cardiology

## 2023-02-21 ENCOUNTER — Other Ambulatory Visit: Payer: Self-pay | Admitting: Cardiology

## 2023-02-21 ENCOUNTER — Telehealth: Payer: Self-pay | Admitting: Cardiology

## 2023-02-21 ENCOUNTER — Other Ambulatory Visit (HOSPITAL_BASED_OUTPATIENT_CLINIC_OR_DEPARTMENT_OTHER): Payer: Self-pay

## 2023-02-21 DIAGNOSIS — I4891 Unspecified atrial fibrillation: Secondary | ICD-10-CM

## 2023-02-21 NOTE — Telephone Encounter (Signed)
*  STAT* If patient is at the pharmacy, call can be transferred to refill team.   1. Which medications need to be refilled? (please list name of each medication and dose if known) dabigatran  (PRADAXA ) 150 MG CAPS capsule     4. Which pharmacy/location (including street and city if local pharmacy) is medication to be sent to?   [id number] [place], [place] - [address] Phone: [phone]  Fax: [phone]     5. Do they need a 30 day or 90 day supply? 90

## 2023-02-21 NOTE — Telephone Encounter (Signed)
 Spoke with pt and pt asked if Pradaxa  could be sent to [facility]. I called [facility] [place]  and Pradaxa  was already sent to [facility] this morning. Left a detailed message with that information for pt.

## 2023-02-21 NOTE — Telephone Encounter (Signed)
 Called [facility] [place] [address], and they will [facility] to get abigatran (PRADAXA ) 150 MG CAPS capsule switched to their pharmacy. [facility] and [facility]

## 2023-02-22 ENCOUNTER — Other Ambulatory Visit (HOSPITAL_BASED_OUTPATIENT_CLINIC_OR_DEPARTMENT_OTHER): Payer: Self-pay | Admitting: Cardiology

## 2023-02-22 DIAGNOSIS — I4891 Unspecified atrial fibrillation: Secondary | ICD-10-CM

## 2023-02-22 MED ORDER — DABIGATRAN ETEXILATE MESYLATE 150 MG PO CAPS
150.0000 mg | ORAL_CAPSULE | Freq: Two times a day (BID) | ORAL | 2 refills | Status: DC
Start: 2023-02-22 — End: 2023-04-20

## 2023-02-22 NOTE — Telephone Encounter (Signed)
Sent refills to requested pharmacy

## 2023-02-22 NOTE — Telephone Encounter (Signed)
Pt called back and said that [facility] out of stock and that [facility] and [facility].

## 2023-03-27 ENCOUNTER — Ambulatory Visit (HOSPITAL_BASED_OUTPATIENT_CLINIC_OR_DEPARTMENT_OTHER): Payer: Medicare Other | Admitting: Cardiology

## 2023-04-20 ENCOUNTER — Ambulatory Visit (HOSPITAL_BASED_OUTPATIENT_CLINIC_OR_DEPARTMENT_OTHER): Payer: Medicare Other | Admitting: Family

## 2023-04-20 ENCOUNTER — Encounter (HOSPITAL_BASED_OUTPATIENT_CLINIC_OR_DEPARTMENT_OTHER): Payer: Self-pay | Admitting: Family

## 2023-04-20 ENCOUNTER — Other Ambulatory Visit (HOSPITAL_BASED_OUTPATIENT_CLINIC_OR_DEPARTMENT_OTHER): Payer: Self-pay

## 2023-04-20 VITALS — BP 138/64 | HR 68 | Ht 70.0 in | Wt 139.0 lb

## 2023-04-20 DIAGNOSIS — I4891 Unspecified atrial fibrillation: Secondary | ICD-10-CM

## 2023-04-20 DIAGNOSIS — D6859 Other primary thrombophilia: Secondary | ICD-10-CM | POA: Diagnosis not present

## 2023-04-20 DIAGNOSIS — I4892 Unspecified atrial flutter: Secondary | ICD-10-CM | POA: Diagnosis not present

## 2023-04-20 DIAGNOSIS — I429 Cardiomyopathy, unspecified: Secondary | ICD-10-CM

## 2023-04-20 DIAGNOSIS — Q2112 Patent foramen ovale: Secondary | ICD-10-CM

## 2023-04-20 DIAGNOSIS — I10 Essential (primary) hypertension: Secondary | ICD-10-CM | POA: Diagnosis not present

## 2023-04-20 MED ORDER — DABIGATRAN ETEXILATE MESYLATE 150 MG PO CAPS
ORAL_CAPSULE | ORAL | 0 refills | Status: DC
  Filled 2023-04-20: qty 60, 30d supply, fill #0

## 2023-04-20 MED ORDER — DABIGATRAN ETEXILATE MESYLATE 150 MG PO CAPS: 150.0000 mg | ORAL_CAPSULE | Freq: Two times a day (BID) | ORAL | 2 refills | Status: DC

## 2023-04-20 NOTE — Patient Instructions (Signed)
 Medication Instructions:  Your physician recommends that you continue on your current medications as directed. Please refer to the Current Medication list given to you today.  Follow-Up: At [facility], you and your health needs are our priority.  As part of our continuing mission to provide you with exceptional heart care, we have created designated Provider Care Teams.  These Care Teams include your primary Cardiologist (physician) and Advanced Practice Providers (APPs -  Physician Assistants and Nurse Practitioners) who all work together to provide you with the care you need, when you need it.  We recommend signing up for the patient portal called "MyChart".  Sign up information is provided on this After Visit Summary.  MyChart is used to connect with patients for Virtual Visits (Telemedicine).  Patients are able to view lab/test results, encounter notes, upcoming appointments, etc.  Non-urgent messages can be sent to your provider as well.   To learn more about what you can do with MyChart, go to [email or web address].    Your next appointment:   1 year with Dr. Cristal Deer

## 2023-04-20 NOTE — Progress Notes (Signed)
 Cardiology Office Note:  .   Date:  04/22/2023  ID:  Neil Cordova, DOB Apr 27, 1940, MRN [id number] PCP: Earley Brooke., MD  [facility] Providers Cardiologist:  Jodelle Red, MD    History of Present Illness: .   Neil Cordova is a 83 y.o. male with history of atrial flutter on apixaban, cardiomyopathy, hypertension, PFO.  Last seen by Dr. Cristal Deer 03/18/2021.  She had extensively reviewed his prior echocardiograms.  Lowest LVEF by her review of 45-50% by TTE and TEE 06/2019.  Most recent TTE/TEE 07/2019 LVEF 55 to 60%.  His prior reported cardiomyopathy was felt to be have been exacerbated by atrial flutter. Eliquis transitioned to Pradaxa due to cost.   Presents today for follow up. He has continued to run at minimum five miles, three days per week. He is also still working and considering retiring soon. He does not check his BP at home and does report white coat hypertension. He sees [facility] in [place] for primary care and had labs drawn a month ago, will request. Reports no shortness of breath nor dyspnea on exertion. Reports no chest pain, pressure, or tightness. No edema, orthopnea, PND. Reports no palpitations. Asymptomatic with prior episodes of atrial flutter.   ROS: Please see the history of present illness.    All other systems reviewed and are negative.   Studies Reviewed: Marland Kitchen   EKG Interpretation Date/Time:  Friday April 20 2023 14:37:49 EDT Ventricular Rate:  63 PR Interval:  332 QRS Duration:  114 QT Interval:  394 QTC Calculation: 403 R Axis:   -27  Text Interpretation: Sinus rhythm with 1st degree A-V block  No acute ST/T wave changes Confirmed by Gillian Shields ([id number]) on 04/20/2023 2:44:27 PM    Cardiac Studies & Procedures   ______________________________________________________________________________________________     ECHOCARDIOGRAM  ECHOCARDIOGRAM COMPLETE 07/23/2019  Narrative ECHOCARDIOGRAM REPORT    Patient Name:    Neil Cordova Date of Exam: 07/23/2019 Medical Rec #:  [id number]     Height:       70.0 in Accession #:    [id number]    Weight:       160.8 lb Date of Birth:  1940-04-22     BSA:          1.902 m Patient Age:    79 years      BP:           145/76 mmHg Patient Gender: M             HR:           168 bpm. Exam Location:  Inpatient  Procedure: 2D Echo  Indications:    CHF-Acute Systolic 428.21 / I50.21  History:        Patient has prior history of Echocardiogram examinations, most recent 07/03/2019. Risk Factors:Hypertension.  Sonographer:    Thurman Coyer RDCS (AE) Referring Phys: [id number] Daylene Katayama [facility]  IMPRESSIONS   1. Left ventricular ejection fraction, by estimation, is 55 to 60%. The left ventricle has normal function. The left ventricle has no regional wall motion abnormalities. Left ventricular diastolic parameters are indeterminate. 2. Right ventricular systolic function is normal. The right ventricular size is normal. 3. Left atrial size was mildly dilated. 4. The mitral valve is normal in structure. Mild mitral valve regurgitation. No evidence of mitral stenosis. 5. The aortic valve has an indeterminant number of cusps. Aortic valve regurgitation is not visualized. Mild to moderate aortic valve sclerosis/calcification is present, without any evidence  of aortic stenosis. 6. The inferior vena cava is normal in size with greater than 50% respiratory variability, suggesting right atrial pressure of 3 mmHg.  FINDINGS Left Ventricle: Left ventricular ejection fraction, by estimation, is 55 to 60%. The left ventricle has normal function. The left ventricle has no regional wall motion abnormalities. The left ventricular internal cavity size was normal in size. There is no left ventricular hypertrophy. Left ventricular diastolic parameters are indeterminate. Normal left ventricular filling pressure.  Right Ventricle: The right ventricular size is normal. No increase in right  ventricular wall thickness. Right ventricular systolic function is normal.  Left Atrium: Left atrial size was mildly dilated.  Right Atrium: Right atrial size was normal in size.  Pericardium: There is no evidence of pericardial effusion.  Mitral Valve: The mitral valve is normal in structure. Normal mobility of the mitral valve leaflets. Mild mitral valve regurgitation. No evidence of mitral valve stenosis.  Tricuspid Valve: The tricuspid valve is normal in structure. Tricuspid valve regurgitation is trivial. No evidence of tricuspid stenosis.  Aortic Valve: The aortic valve has an indeterminant number of cusps. . There is mild thickening and mild calcification of the aortic valve. Aortic valve regurgitation is not visualized. Mild to moderate aortic valve sclerosis/calcification is present, without any evidence of aortic stenosis. There is mild thickening of the aortic valve. There is mild calcification of the aortic valve.  Pulmonic Valve: The pulmonic valve was normal in structure. Pulmonic valve regurgitation is not visualized. No evidence of pulmonic stenosis.  Aorta: The aortic root is normal in size and structure.  Venous: The inferior vena cava is normal in size with greater than 50% respiratory variability, suggesting right atrial pressure of 3 mmHg.  IAS/Shunts: No atrial level shunt detected by color flow Doppler.   LEFT VENTRICLE PLAX 2D LVIDd:         4.40 cm  Diastology LVIDs:         3.30 cm  LV e' lateral:   15.90 cm/s LV PW:         1.00 cm  LV E/e' lateral: 7.0 LV IVS:        0.90 cm  LV e' medial:    13.80 cm/s LVOT diam:     2.00 cm  LV E/e' medial:  8.1 LV SV:         73 LV SV Index:   38 LVOT Area:     3.14 cm   RIGHT VENTRICLE RV S prime:     10.80 cm/s TAPSE (M-mode): 1.7 cm  LEFT ATRIUM             Index       RIGHT ATRIUM           Index LA diam:        4.20 cm 2.21 cm/m  RA Area:     17.00 cm LA Vol (A2C):   78.2 ml 41.10 ml/m RA Volume:    37.10 ml  19.50 ml/m LA Vol (A4C):   55.0 ml 28.91 ml/m LA Biplane Vol: 67.2 ml 35.32 ml/m AORTIC VALVE LVOT Vmax:   132.00 cm/s LVOT Vmean:  77.600 cm/s LVOT VTI:    0.231 m  AORTA Ao Root diam: 3.60 cm  MITRAL VALVE MV Area (PHT): 3.60 cm     SHUNTS MV Decel Time: 211 msec     Systemic VTI:  0.23 m MR Peak grad: 90.6 mmHg     Systemic Diam: 2.00 cm MR Mean grad: 58.0 mmHg  MR Vmax:      476.00 cm/s MR Vmean:     350.0 cm/s MV E velocity: 112.00 cm/s MV A velocity: 60.20 cm/s MV E/A ratio:  1.86  Armanda Magic MD Electronically signed by Armanda Magic MD Signature Date/Time: 07/23/2019/3:42:48 PM    Final   TEE  ECHO TEE 07/25/2019  Narrative TRANSESOPHOGEAL ECHO REPORT    Patient Name:   Neil Cordova Date of Exam: 07/25/2019 Medical Rec #:  [id number]     Height:       70.0 in Accession #:    [id number]    Weight:       145.8 lb Date of Birth:  1940-11-24     BSA:          1.825 m Patient Age:    79 years      BP:           141/51 mmHg Patient Gender: M             HR:           64 bpm. Exam Location:  Inpatient  Procedure: 3D Echo, Transesophageal Echo and Color Doppler  Indications:     I48.92* Unspecified atrial flutter  History:         Patient has prior history of Echocardiogram examinations, most recent 07/23/2019. CHF, Abnormal ECG, Arrythmias:Atrial Flutter; Signs/Symptoms:Altered Mental Status and Bacteremia.  Sonographer:     Sheralyn Boatman RDCS Referring Phys:  [id number] Corrin Parker Diagnosing Phys: Charlton Haws MD  PROCEDURE: After discussion of the risks and benefits of a TEE, an informed consent was obtained from the patient. The patient was intubated. Anesthesia passed using glid scope after patient intubated. Imaged were obtained with the patient in a left lateral decubitus position. Sedation performed by different physician. The patient was monitored while under deep sedation. Anesthestetic sedation was provided intravenously by  Anesthesiology: 275mg  of Propofol. Image quality was excellent. The patient developed no complications and Patient intubated electively during the procedure. A successful direct current cardioversion was performed.  IMPRESSIONS   1. Anesthesia/Cardiology unable to pass scope with propofol Patient electively intubated and Dr Stephannie Peters passed probe using glide scope and direct visualization. 2. DCC x 1 150J patient converted to NSR from flutter at rate of 64 bpm. 3. Left ventricular ejection fraction, by estimation, is 55 to 60%. The left ventricle has normal function. The left ventricle has no regional wall motion abnormalities. Left ventricular diastolic function could not be evaluated. 4. Right ventricular systolic function is normal. The right ventricular size is normal. 5. Left atrial size was moderately dilated. No left atrial/left atrial appendage thrombus was detected. 6. Right atrial size was moderately dilated. 7. The mitral valve is normal in structure. Mild mitral valve regurgitation. 8. The aortic valve is tricuspid. Aortic valve regurgitation is not visualized. No aortic stenosis is present. 9. PFO present. There is a small patent foramen ovale with predominantly left to right shunting across the atrial septum.  FINDINGS Left Ventricle: Left ventricular ejection fraction, by estimation, is 55 to 60%. The left ventricle has normal function. The left ventricle has no regional wall motion abnormalities. The left ventricular internal cavity size was normal in size. There is no left ventricular hypertrophy. Left ventricular diastolic function could not be evaluated.  Right Ventricle: The right ventricular size is normal. Right vetricular wall thickness was not assessed. Right ventricular systolic function is normal.  Left Atrium: Left atrial size was moderately dilated. No left atrial/left atrial appendage thrombus  was detected.  Right Atrium: Right atrial size was moderately  dilated.  Pericardium: There is no evidence of pericardial effusion.  Mitral Valve: The mitral valve is normal in structure. Mild mitral valve regurgitation.  Tricuspid Valve: The tricuspid valve is normal in structure. Tricuspid valve regurgitation is mild.  Aortic Valve: The aortic valve is tricuspid. Aortic valve regurgitation is not visualized. No aortic stenosis is present.  Pulmonic Valve: The pulmonic valve was normal in structure. Pulmonic valve regurgitation is trivial.  Aorta: The aortic root is normal in size and structure.  IAS/Shunts: There is redundancy of the interatrial septum. PFO present. A small patent foramen ovale is detected with predominantly left to right shunting across the atrial septum.  Additional Comments: Anesthesia/Cardiology unable to pass scope with propofol Patient electively intubated and Dr Stephannie Peters passed probe using glide scope and direct visualization. DCC x 1 150J patient converted to NSR from flutter at rate of 64 bpm.  Charlton Haws MD Electronically signed by Charlton Haws MD Signature Date/Time: 07/25/2019/10:06:24 AM    Final  MONITORS  CARDIAC EVENT MONITOR 09/23/2019  Narrative 30 days of data recorded on Zio monitor. Patient had a min HR of 37 bpm, max HR of 139 bpm, and avg HR of 64 bpm. Predominant underlying rhythm was Sinus Rhythm. No SVT, atrial fibrillation, high degree block noted. Isolated atrial and ventricular ectopy was rare (<1%). There were 5 automatically triggered events and 1 manually triggered events. These were sinus rhythm with 1st degree AV block, some with a PVC. There was one episode of sinus bradycardia with second degree AV block and 3.2 second pause, occurring 09/14/19 at 11:15 AM. There was one episode of 4 beats of NSVT.       ______________________________________________________________________________________________      Risk Assessment/Calculations:    CHA2DS2-VASc Score = 4   This indicates a 4.8% annual  risk of stroke. The patient's score is based upon: CHF History: 1 HTN History: 1 Diabetes History: 0 Stroke History: 0 Vascular Disease History: 0 Age Score: 2 Gender Score: 0           Physical Exam:   VS:  BP 138/64   Pulse 68   Ht 5\' 10"  (1.778 m)   Wt 139 lb (63 kg)   SpO2 98%   BMI 19.94 kg/m    Wt Readings from Last 3 Encounters:  04/20/23 139 lb (63 kg)  03/18/21 145 lb 1.6 oz (65.8 kg)  12/15/20 154 lb 6.4 oz (70 kg)    Today's Vitals   04/20/23 1425 04/20/23 1521  BP: (!) 156/72 138/64  Pulse: 68   SpO2: 98%   Weight: 139 lb (63 kg)   Height: 5\' 10"  (1.778 m)    Body mass index is 19.94 kg/m.  GEN: Well nourished, well developed in no acute distress NECK: No JVD; No carotid bruits CARDIAC: RRR, no murmurs, rubs, gallops RESPIRATORY:  Clear to auscultation without rales, wheezing or rhonchi  ABDOMEN: Soft, non-tender, non-distended EXTREMITIES:  No edema; No deformity   ASSESSMENT AND PLAN: .    Atrial flutter/hypercoagulable state- Previous episodes atrial flutter were asymptomatic. EKG today NSR. Denies palpitations. Continue dabigatran 150 mg twice daily (Eliquis previously cost prohibitive). Refill provided and GoodRx coupon printed. CHA2DS2-VASc Score = 4 [CHF History: 1, HTN History: 1, Diabetes History: 0, Stroke History: 0, Vascular Disease History: 0, Age Score: 2, Gender Score: 0].  Therefore, the patient's annual risk of stroke is 4.8 %.    Will request recent CBC  from PCP  Cardiomyopathy-most recent imaging 07/2019 LVEF 55 to 60%. Previously related to atrial flutter. No heart failure symptoms.   HTN- BP in office initially 156/72 with repeat 138/64. Reports white coat hypertension. He does not check it at home, but plans to start twice a week. Discussed to monitor BP at home at least 2 hours after medications and sitting for 5-10 minutes. If BP routinely >130/80 at home, he will contact the office.   PFO-incidental finding on TEE.  No prior  stroke. No indication for closure.        Dispo: Follow up in 1 year.   Signed, Alver Sorrow, NP

## 2023-04-22 ENCOUNTER — Encounter (HOSPITAL_BASED_OUTPATIENT_CLINIC_OR_DEPARTMENT_OTHER): Payer: Self-pay | Admitting: Family

## 2023-06-20 ENCOUNTER — Other Ambulatory Visit (HOSPITAL_COMMUNITY): Payer: Self-pay

## 2023-07-18 ENCOUNTER — Other Ambulatory Visit (HOSPITAL_BASED_OUTPATIENT_CLINIC_OR_DEPARTMENT_OTHER): Payer: Self-pay | Admitting: Family

## 2023-07-18 DIAGNOSIS — I4891 Unspecified atrial fibrillation: Secondary | ICD-10-CM

## 2023-07-18 NOTE — Telephone Encounter (Addendum)
 Prescription refill request for Pradaxa  received.  Indication: Aflutter Last office visit: 04/20/23 Otho Blitz)  Weight: 63kg Age: 83 Scr: 1.22 (05/05/22)  CrCl: 41.44ml/min  Labs overdue. [employer] and spoke with Brandi. Pt does not have an updated Scr within the past year; however, pt is scheduled for PCP follow-up at the end of this month and a note will be sent to the nurse for these labs to be drawn at upcoming appt with [facility]. Refill sent to prevent any missed doses.

## 2023-11-27 ENCOUNTER — Other Ambulatory Visit (HOSPITAL_BASED_OUTPATIENT_CLINIC_OR_DEPARTMENT_OTHER): Payer: Self-pay | Admitting: Cardiology

## 2023-11-27 DIAGNOSIS — I4891 Unspecified atrial fibrillation: Secondary | ICD-10-CM

## 2023-11-28 NOTE — Telephone Encounter (Signed)
 Prescription refill request for Pradaxa  150mg  received.  Indication: AF Last office visit: 04/20/23  JAYSON Finder NP Weight: 63kg Age: 83 Scr: 1.22 on 05/05/22   Epic CrCl: 41.60  Based on above findings Pradaxa  150mg  twice daily is the appropriate dose.  Refill approved.
